# Patient Record
Sex: Female | Born: 1960 | Race: Black or African American | Hispanic: No | Smoking: Current every day smoker
Health system: Southern US, Community
[De-identification: ages and names within clinical notes are randomized; demographics above are authoritative.]

## PROBLEM LIST (undated history)

## (undated) DIAGNOSIS — Z720 Tobacco use: Secondary | ICD-10-CM

## (undated) DIAGNOSIS — N189 Chronic kidney disease, unspecified: Secondary | ICD-10-CM

## (undated) DIAGNOSIS — G35 Multiple sclerosis: Secondary | ICD-10-CM

## (undated) DIAGNOSIS — G894 Chronic pain syndrome: Secondary | ICD-10-CM

## (undated) DIAGNOSIS — I10 Essential (primary) hypertension: Secondary | ICD-10-CM

## (undated) DIAGNOSIS — M109 Gout, unspecified: Secondary | ICD-10-CM

## (undated) DIAGNOSIS — U071 COVID-19: Secondary | ICD-10-CM

## (undated) HISTORY — DX: Tobacco use: Z72.0

## (undated) HISTORY — DX: Chronic kidney disease, unspecified: N18.9

## (undated) HISTORY — PX: FOOT SURGERY: SHX648

## (undated) HISTORY — DX: Chronic pain syndrome: G89.4

## (undated) HISTORY — DX: COVID-19: U07.1

---

## 2013-02-10 ENCOUNTER — Emergency Department (INDEPENDENT_AMBULATORY_CARE_PROVIDER_SITE_OTHER)
Admission: EM | Admit: 2013-02-10 | Discharge: 2013-02-10 | Disposition: A | Discharge status: Home / Self Care | Payer: Self-pay | Source: Home / Self Care

## 2013-02-10 ENCOUNTER — Encounter (HOSPITAL_COMMUNITY): Payer: Self-pay | Admitting: Emergency Medicine

## 2013-02-10 DIAGNOSIS — R51 Headache: Secondary | ICD-10-CM

## 2013-02-10 DIAGNOSIS — I10 Essential (primary) hypertension: Secondary | ICD-10-CM

## 2013-02-10 DIAGNOSIS — M79609 Pain in unspecified limb: Secondary | ICD-10-CM

## 2013-02-10 DIAGNOSIS — J329 Chronic sinusitis, unspecified: Secondary | ICD-10-CM

## 2013-02-10 DIAGNOSIS — M79605 Pain in left leg: Secondary | ICD-10-CM

## 2013-02-10 DIAGNOSIS — IMO0001 Reserved for inherently not codable concepts without codable children: Secondary | ICD-10-CM

## 2013-02-10 HISTORY — DX: Essential (primary) hypertension: I10

## 2013-02-10 MED ORDER — TRAMADOL HCL 50 MG PO TABS: 50.0000 mg | ORAL_TABLET | Freq: Four times a day (QID) | ORAL | Status: DC | PRN

## 2013-02-10 MED ORDER — ALBUTEROL SULFATE HFA 108 (90 BASE) MCG/ACT IN AERS: 1.0000 | INHALATION_SPRAY | Freq: Four times a day (QID) | RESPIRATORY_TRACT | Status: DC | PRN

## 2013-02-10 MED ORDER — LISINOPRIL-HYDROCHLOROTHIAZIDE 20-12.5 MG PO TABS: 1.0000 | ORAL_TABLET | Freq: Every day | ORAL | Status: DC

## 2013-02-10 MED ORDER — AMLODIPINE BESYLATE 10 MG PO TABS: 5.0000 mg | ORAL_TABLET | Freq: Every day | ORAL | Status: DC

## 2013-02-10 MED ORDER — METHYLPREDNISOLONE 4 MG PO KIT: PACK | ORAL | Status: DC

## 2013-02-10 MED ORDER — KETOROLAC TROMETHAMINE 30 MG/ML IJ SOLN
30.0000 mg | Freq: Once | INTRAMUSCULAR | Status: AC
  Administered 2013-02-10: 30 mg via INTRAMUSCULAR

## 2013-02-10 MED ORDER — KETOROLAC TROMETHAMINE 30 MG/ML IJ SOLN
INTRAMUSCULAR | Status: AC
  Filled 2013-02-10: qty 1

## 2013-02-10 NOTE — ED Provider Notes (Signed)
History     CSN: [id number]  Arrival date & time 02/10/13  1314   First MD Initiated Contact with Patient 02/10/13 1452      Chief Complaint  Patient presents with  . Muscle Pain    (Consider location/radiation/quality/duration/timing/severity/associated sxs/prior treatment) HPI Comments: 52 year old morbidly obese female with history of hypertension , chronic pain and tobacco abuse for many years presents with headache for several months possibly a 20 year. Pain in her arms especially the right arm for over a year and left leg pain for greater than a year. She visited the hospital approximately 3 months ago with right sided headache and facial pain was told she had a sinus infection treated with medications but she said she never got better. She did not followup with PCP in she is not taking her blood pressure medication. Denies chest pain but occasionally has shortness of breath. She is complaining of pain all over her body.  Patient is a 52 y.o. female presenting with musculoskeletal pain.  Muscle Pain Associated symptoms include shortness of breath. Pertinent negatives include no chest pain.    Past Medical History  Diagnosis Date  . Hypertension     Past Surgical History  Procedure Laterality Date  . Foot surgery Right     No family history on file.  History  Substance Use Topics  . Smoking status: Current Every Day Smoker  . Smokeless tobacco: Not on file  . Alcohol Use: No    OB History   Grav Para Term Preterm Abortions TAB SAB Ect Mult Living                  Review of Systems  Constitutional: Positive for activity change. Negative for fever.  HENT: Positive for sinus pressure. Negative for sore throat and neck pain.   Respiratory: Positive for shortness of breath.   Cardiovascular: Negative for chest pain, palpitations and leg swelling.  Gastrointestinal: Negative.   Musculoskeletal: Positive for myalgias, arthralgias and gait problem.  Skin: Negative.    Neurological: Negative.   Psychiatric/Behavioral: Positive for dysphoric mood.    Allergies  Review of patient's allergies indicates no known allergies.  Home Medications   Current Outpatient Rx  Name  Route  Sig  Dispense  Refill  . OVER THE COUNTER MEDICATION      Reports there is a third blood pressure medicine, but cannot remember the name and lost the bottle         . albuterol (PROVENTIL HFA;VENTOLIN HFA) 108 (90 BASE) MCG/ACT inhaler   Inhalation   Inhale 1-2 puffs into the lungs every 6 (six) hours as needed for wheezing.   1 Inhaler   0   . amLODipine (NORVASC) 10 MG tablet   Oral   Take 0.5 tablets (5 mg total) by mouth daily.   15 tablet   0   . hydrochlorothiazide (HYDRODIURIL) 12.5 MG tablet   Oral   Take 12.5 mg by mouth daily.         Marland Kitchen lisinopril (PRINIVIL,ZESTRIL) 10 MG tablet   Oral   Take 10 mg by mouth daily.         Marland Kitchen lisinopril-hydrochlorothiazide (ZESTORETIC) 20-12.5 MG per tablet   Oral   Take 1 tablet by mouth daily.   20 tablet   0   . methylPREDNISolone (MEDROL DOSEPAK) 4 MG tablet      follow package directions   21 tablet   0   . traMADol (ULTRAM) 50 MG tablet  Oral   Take 1 tablet (50 mg total) by mouth every 6 (six) hours as needed for pain.   15 tablet   0     BP 199/107  Pulse 72  Temp(Src) 98.5 F (36.9 C) (Oral)  Resp 22  SpO2 95%  Physical Exam  Nursing note and vitals reviewed. Constitutional: She is oriented to person, place, and time. She appears well-developed and well-nourished. No distress.  HENT:  Right Ear: External ear normal.  Left Ear: External ear normal.  Mouth/Throat: Oropharynx is clear and moist. No oropharyngeal exudate.  Eyes: EOM are normal. Pupils are equal, round, and reactive to light.  Neck: Normal range of motion. Neck supple.  Cardiovascular: Normal rate, regular rhythm and normal heart sounds.   Pulmonary/Chest: Effort normal and breath sounds normal. She has no rales. She  exhibits tenderness.  Rare, and expiratory wheeze with forced expirations only. Positive for diffuse anterior chest wall tenderness as well as marked tenderness over the clavicle.  Musculoskeletal: She exhibits tenderness. She exhibits no edema.  Moderate to severe tenderness over the clavicle, triceps musculature. Muscles of the left arm, left lower leg, knee, back and forehead. When pain is reproduced with manual palpation she withdrawals and starts to cry.  Lymphadenopathy:    She has no cervical adenopathy.  Neurological: She is alert and oriented to person, place, and time. No cranial nerve deficit or sensory deficit. She exhibits normal muscle tone.  Skin: Skin is warm and dry. No rash noted. No erythema.  Psychiatric: Her speech is normal. Thought content normal. She exhibits a depressed mood.    ED Course  Procedures (including critical care time)  Labs Reviewed - No data to display No results found.   1. Myalgia and myositis, unspecified   2. Headache   3. Sinusitis   4. HTN (hypertension)   5. Leg pain, left       MDM  Zestoretic 20/12.5 mg one daily Amlodipine 5 mg daily Medrol Dosepak as directed Tramadol 50 mg every 4 hours when necessary pain #15 Albuterol HFA one to 2 puffs every 4 hours when necessary cough and wheeze [facility] tomorrow for appointment prefer to number above.        Hayden Rasmussen, NP 02/10/13 [phone]

## 2013-02-10 NOTE — ED Notes (Signed)
Patient reports body pain is her concern today.  Patient reports right sided headache and bilateral arms and leg pains for > 1 year.  "body pain" over the past few days.  Also reports having 3-4 diarrhea stools/day for the past 2 days.  Reports feeling generally weak.  Denies fever.

## 2013-02-10 NOTE — ED Provider Notes (Signed)
Medical screening examination/treatment/procedure(s) were performed by resident physician or non-physician practitioner and as supervising physician I was immediately available for consultation/collaboration.   KINDL,JAMES DOUGLAS MD.   James D Kindl, MD 02/10/13 1845 

## 2013-02-15 ENCOUNTER — Ambulatory Visit: Payer: Self-pay | Attending: Family Medicine | Admitting: Internal Medicine

## 2013-02-15 VITALS — BP 198/142 | HR 79 | Temp 98.2°F | Resp 19 | Wt 269.8 lb

## 2013-02-15 DIAGNOSIS — I10 Essential (primary) hypertension: Secondary | ICD-10-CM | POA: Insufficient documentation

## 2013-02-15 MED ORDER — CLONIDINE HCL 0.1 MG PO TABS
0.1000 mg | ORAL_TABLET | Freq: Once | ORAL | Status: AC
  Administered 2013-02-15: 0.1 mg via ORAL

## 2013-02-15 MED ORDER — HYDROCODONE-ACETAMINOPHEN 10-325 MG PO TABS: 1.0000 | ORAL_TABLET | Freq: Three times a day (TID) | ORAL | Status: DC | PRN

## 2013-02-15 NOTE — Progress Notes (Addendum)
Patient ID: Tiffany Brady, female   DOB: 1961-01-07, 52 y.o.   MRN: [id number]  CC: Followup for blood pressure medications  HPI: 52 year old female with past medical history of hypertension and asthma who presented to clinic for followup. She complains of headaches worsening over past couple of days. She has not been compliant with her blood pressure medications and on this visit her blood pressure is high at 198/142. Patient has no complaints of chest pain, shortness of breath or palpitations. No dizziness or loss of consciousness. No sweating and no lightheadedness.  No Known Allergies Past Medical History  Diagnosis Date  . Hypertension    Current Outpatient Prescriptions on File Prior to Visit  Medication Sig Dispense Refill  . albuterol (PROVENTIL HFA;VENTOLIN HFA) 108 (90 BASE) MCG/ACT inhaler Inhale 1-2 puffs into the lungs every 6 (six) hours as needed for wheezing.  1 Inhaler  0  . amLODipine (NORVASC) 10 MG tablet Take 0.5 tablets (5 mg total) by mouth daily.  15 tablet  0  . hydrochlorothiazide (HYDRODIURIL) 12.5 MG tablet Take 12.5 mg by mouth daily.      Marland Kitchen lisinopril (PRINIVIL,ZESTRIL) 10 MG tablet Take 10 mg by mouth daily.      Marland Kitchen lisinopril-hydrochlorothiazide (ZESTORETIC) 20-12.5 MG per tablet Take 1 tablet by mouth daily.  20 tablet  0  . methylPREDNISolone (MEDROL DOSEPAK) 4 MG tablet follow package directions  21 tablet  0  . OVER THE COUNTER MEDICATION Reports there is a third blood pressure medicine, but cannot remember the name and lost the bottle      . traMADol (ULTRAM) 50 MG tablet Take 1 tablet (50 mg total) by mouth every 6 (six) hours as needed for pain.  15 tablet  0   No current facility-administered medications on file prior to visit.   History reviewed. No pertinent family history. History   Social History  . Marital Status: Widowed    Spouse Name: N/A    Number of Children: N/A  . Years of Education: N/A   Occupational History  . Not on file.    Social History Main Topics  . Smoking status: Current Every Day Smoker  . Smokeless tobacco: Not on file  . Alcohol Use: No  . Drug Use: No  . Sexually Active: Not on file   Other Topics Concern  . Not on file   Social History Narrative  . No narrative on file   Family medical history significant for HTN, HLD  Review of Systems  Constitutional: Negative for fever, chills, diaphoresis, activity change, appetite change and fatigue.  HENT: Negative for ear pain, nosebleeds, congestion, facial swelling, rhinorrhea, neck pain, neck stiffness and ear discharge.   Eyes: Negative for pain, discharge, redness, itching and visual disturbance.  Respiratory: Negative for cough, choking, chest tightness, shortness of breath, wheezing and stridor.   Cardiovascular: Negative for chest pain, palpitations and leg swelling.  Gastrointestinal: Negative for abdominal distention.  Genitourinary: Negative for dysuria, urgency, frequency, hematuria, flank pain, decreased urine volume, difficulty urinating and dyspareunia.  Musculoskeletal: Negative for back pain, joint swelling, arthralgias and gait problem.  Neurological: Negative for dizziness, tremors, seizures, syncope, facial asymmetry, speech difficulty, weakness, light-headedness, numbness and headaches.  Hematological: Negative for adenopathy. Does not bruise/bleed easily.  Psychiatric/Behavioral: Negative for hallucinations, behavioral problems, confusion, dysphoric mood, decreased concentration and agitation.    Objective:   Filed Vitals:   02/15/13 1627  BP: 198/142  Pulse: 79  Temp: 98.2 F (36.8 C)  Resp: 19  Physical Exam  Constitutional: Appears well-developed and well-nourished. No distress.  HENT: Normocephalic. External right and left ear normal. Oropharynx is clear and moist.  Eyes: Conjunctivae and EOM are normal. PERRLA, no scleral icterus.  Neck: Normal ROM. Neck supple. No JVD. No tracheal deviation. No thyromegaly.   CVS: RRR, S1/S2 +, no murmurs, no gallops, no carotid bruit.  Pulmonary: Effort and breath sounds normal, no stridor, rhonchi, wheezes, rales.  Abdominal: Soft. BS +,  no distension, tenderness, rebound or guarding.  Musculoskeletal: Normal range of motion. No edema and no tenderness.  Lymphadenopathy: No lymphadenopathy noted, cervical, inguinal. Neuro: Alert. Normal reflexes, muscle tone coordination. No cranial nerve deficit. Skin: Skin is warm and dry. No rash noted. Not diaphoretic. No erythema. No pallor.  Psychiatric: Normal mood and affect. Behavior, judgment, thought content normal.   No results found for this basename: WBC, HGB, HCT, MCV, PLT   No results found for this basename: CREATININE, BUN, NA, K, CL, CO2    No results found for this basename: HGBA1C   Lipid Panel  No results found for this basename: chol, trig, hdl, cholhdl, vldl, ldlcalc      Assessment and plan:   Patient Active Problem List   Diagnosis Date Noted  . Accelerated hypertension 02/15/2013    Priority: High - Blood pressure 198/142. Patient is not taking the blood pressure medications consistently. She does have prescriptions with her however she did not fill them.  - We will give Catapres 0.1 mg and see if this lower the blood pressure to reasonable number. If not then we will most likely refer patient to emergency room for further evaluation.   - Blood pressure followup evaluation ; 177/107; patient can go home with the instruction to refill her medications for which she has already been given prescriptions

## 2013-02-15 NOTE — Patient Instructions (Addendum)

## 2013-02-15 NOTE — Addendum Note (Signed)
Addended by: Ronnie Derby C on: 02/15/2013 05:00 PM   Modules accepted: Orders

## 2013-02-15 NOTE — Progress Notes (Signed)
Patient here to establish care History of hypertension Complains of headaches Pain in mucles in arms

## 2013-02-15 NOTE — Addendum Note (Signed)
Addended by: Alison Murray on: 02/15/2013 05:44 PM   Modules accepted: Orders

## 2013-05-30 ENCOUNTER — Ambulatory Visit: Payer: Self-pay

## 2013-06-04 ENCOUNTER — Ambulatory Visit: Payer: Self-pay | Admitting: Internal Medicine

## 2013-06-05 ENCOUNTER — Emergency Department (HOSPITAL_COMMUNITY): Payer: No Typology Code available for payment source

## 2013-06-05 ENCOUNTER — Encounter: Payer: Self-pay | Admitting: Internal Medicine

## 2013-06-05 ENCOUNTER — Ambulatory Visit: Payer: No Typology Code available for payment source | Attending: Family Medicine | Admitting: Internal Medicine

## 2013-06-05 ENCOUNTER — Encounter (HOSPITAL_COMMUNITY): Payer: Self-pay | Admitting: Physical Medicine and Rehabilitation

## 2013-06-05 ENCOUNTER — Emergency Department (HOSPITAL_COMMUNITY)
Admission: EM | Admit: 2013-06-05 | Discharge: 2013-06-05 | Disposition: A | Discharge status: Home / Self Care | Payer: No Typology Code available for payment source | Attending: Emergency Medicine | Admitting: Emergency Medicine

## 2013-06-05 VITALS — BP 202/122 | HR 80 | Temp 98.1°F | Resp 18 | Ht 65.0 in | Wt 268.0 lb

## 2013-06-05 DIAGNOSIS — R5381 Other malaise: Secondary | ICD-10-CM | POA: Insufficient documentation

## 2013-06-05 DIAGNOSIS — R0789 Other chest pain: Secondary | ICD-10-CM | POA: Insufficient documentation

## 2013-06-05 DIAGNOSIS — J3489 Other specified disorders of nose and nasal sinuses: Secondary | ICD-10-CM | POA: Insufficient documentation

## 2013-06-05 DIAGNOSIS — H538 Other visual disturbances: Secondary | ICD-10-CM | POA: Insufficient documentation

## 2013-06-05 DIAGNOSIS — M542 Cervicalgia: Secondary | ICD-10-CM | POA: Insufficient documentation

## 2013-06-05 DIAGNOSIS — R079 Chest pain, unspecified: Secondary | ICD-10-CM | POA: Insufficient documentation

## 2013-06-05 DIAGNOSIS — G8929 Other chronic pain: Secondary | ICD-10-CM | POA: Insufficient documentation

## 2013-06-05 DIAGNOSIS — IMO0001 Reserved for inherently not codable concepts without codable children: Secondary | ICD-10-CM | POA: Insufficient documentation

## 2013-06-05 DIAGNOSIS — R51 Headache: Secondary | ICD-10-CM | POA: Insufficient documentation

## 2013-06-05 DIAGNOSIS — Z79899 Other long term (current) drug therapy: Secondary | ICD-10-CM | POA: Insufficient documentation

## 2013-06-05 DIAGNOSIS — F17200 Nicotine dependence, unspecified, uncomplicated: Secondary | ICD-10-CM | POA: Insufficient documentation

## 2013-06-05 DIAGNOSIS — H53149 Visual discomfort, unspecified: Secondary | ICD-10-CM | POA: Insufficient documentation

## 2013-06-05 DIAGNOSIS — M79609 Pain in unspecified limb: Secondary | ICD-10-CM | POA: Insufficient documentation

## 2013-06-05 DIAGNOSIS — M791 Myalgia, unspecified site: Secondary | ICD-10-CM

## 2013-06-05 DIAGNOSIS — I10 Essential (primary) hypertension: Secondary | ICD-10-CM | POA: Insufficient documentation

## 2013-06-05 DIAGNOSIS — R110 Nausea: Secondary | ICD-10-CM | POA: Insufficient documentation

## 2013-06-05 LAB — CBC WITH DIFFERENTIAL/PLATELET
Basophils Absolute: 0 10*3/uL (ref 0.0–0.1)
Basophils Relative: 0 % (ref 0–1)
Eosinophils Absolute: 0.3 10*3/uL (ref 0.0–0.7)
Eosinophils Relative: 3 % (ref 0–5)
HCT: 46.8 % — ABNORMAL HIGH (ref 36.0–46.0)
Hemoglobin: 15.4 g/dL — ABNORMAL HIGH (ref 12.0–15.0)
Lymphocytes Relative: 27 % (ref 12–46)
Lymphs Abs: 2.4 10*3/uL (ref 0.7–4.0)
MCH: 30 pg (ref 26.0–34.0)
MCHC: 32.9 g/dL (ref 30.0–36.0)
MCV: 91.2 fL (ref 78.0–100.0)
Monocytes Absolute: 0.4 10*3/uL (ref 0.1–1.0)
Monocytes Relative: 5 % (ref 3–12)
Neutro Abs: 5.8 10*3/uL (ref 1.7–7.7)
Neutrophils Relative %: 65 % (ref 43–77)
Platelets: 202 10*3/uL (ref 150–400)
RBC: 5.13 MIL/uL — ABNORMAL HIGH (ref 3.87–5.11)
RDW: 13.9 % (ref 11.5–15.5)
WBC: 8.8 10*3/uL (ref 4.0–10.5)

## 2013-06-05 LAB — COMPREHENSIVE METABOLIC PANEL
ALT: 18 U/L (ref 0–35)
AST: 15 U/L (ref 0–37)
Albumin: 3.3 g/dL — ABNORMAL LOW (ref 3.5–5.2)
Alkaline Phosphatase: 89 U/L (ref 39–117)
BUN: 15 mg/dL (ref 6–23)
Chloride: 106 mEq/L (ref 96–112)
Potassium: 3.8 mEq/L (ref 3.5–5.1)
Sodium: 144 mEq/L (ref 135–145)
Total Bilirubin: 0.6 mg/dL (ref 0.3–1.2)

## 2013-06-05 LAB — PRO B NATRIURETIC PEPTIDE: Pro B Natriuretic peptide (BNP): 154.5 pg/mL — ABNORMAL HIGH (ref 0–125)

## 2013-06-05 LAB — TROPONIN I: Troponin I: <0.3 ng/mL (ref <0.30)

## 2013-06-05 MED ORDER — ONDANSETRON HCL 4 MG/2ML IJ SOLN
4.0000 mg | Freq: Once | INTRAMUSCULAR | Status: AC
  Administered 2013-06-05: 4 mg via INTRAVENOUS
  Filled 2013-06-05: qty 2

## 2013-06-05 MED ORDER — SODIUM CHLORIDE 0.9 % IV BOLUS (SEPSIS)
500.0000 mL | Freq: Once | INTRAVENOUS | Status: AC
  Administered 2013-06-05: 500 mL via INTRAVENOUS

## 2013-06-05 MED ORDER — PROMETHAZINE HCL 25 MG PO TABS: 25.0000 mg | ORAL_TABLET | Freq: Four times a day (QID) | ORAL | Status: DC | PRN

## 2013-06-05 MED ORDER — PROMETHAZINE HCL 25 MG/ML IJ SOLN
12.5000 mg | Freq: Once | INTRAMUSCULAR | Status: AC
  Administered 2013-06-05: 12.5 mg via INTRAVENOUS
  Filled 2013-06-05: qty 1

## 2013-06-05 MED ORDER — SODIUM CHLORIDE 0.9 % IV SOLN: INTRAVENOUS | Status: DC

## 2013-06-05 MED ORDER — HYDROMORPHONE HCL PF 1 MG/ML IJ SOLN
1.0000 mg | Freq: Once | INTRAMUSCULAR | Status: AC
  Administered 2013-06-05: 1 mg via INTRAVENOUS
  Filled 2013-06-05: qty 1

## 2013-06-05 MED ORDER — TRAMADOL HCL 50 MG PO TABS: 50.0000 mg | ORAL_TABLET | Freq: Four times a day (QID) | ORAL | Status: DC | PRN

## 2013-06-05 MED ORDER — LISINOPRIL-HYDROCHLOROTHIAZIDE 20-12.5 MG PO TABS: 1.0000 | ORAL_TABLET | Freq: Every day | ORAL | Status: DC

## 2013-06-05 NOTE — Progress Notes (Signed)
Pt here with c/o frontal H/A radiating to back of head going to neck and R arm intermit 3 mnths. Pt has Hx HTN. Has not taken meds since June Denies c/p this visit Pt to be transferred to ER

## 2013-06-05 NOTE — Progress Notes (Signed)
Patient ID: Tiffany Brady, female   DOB: [birth year], 52 y.o.   MRN: [id number]  CC: neck and arm pain, headache and blurry vision   HPI:  Patient is 52 year old female who comes to clinic with main concern of progressively worsening generalized headaches, throbbing and constant, 7/10 in severity radiating to neck, both arms, back area, associated with blurry vision and generalized fatigue. Patient reports this started several days prior to this visit and has been getting worse, no specific alleviating or aggravating factors, no similar events in the past. Patient denies shortness of breath but experiences substernal chest discomfort, pressure-like, throbbing and with no specific alleviating factors. She has not taken blood pressure medicine over several months and needs refills. No Known Allergies Past Medical History  Diagnosis Date  . Hypertension    Current Outpatient Prescriptions on File Prior to Visit  Medication Sig Dispense Refill  . HYDROcodone-acetaminophen (NORCO) 10-325 MG per tablet Take 1 tablet by mouth every 8 (eight) hours as needed for pain.  45 tablet  0  . traMADol (ULTRAM) 50 MG tablet Take 1 tablet (50 mg total) by mouth every 6 (six) hours as needed for pain.  15 tablet  0  . albuterol (PROVENTIL HFA;VENTOLIN HFA) 108 (90 BASE) MCG/ACT inhaler Inhale 1-2 puffs into the lungs every 6 (six) hours as needed for wheezing.  1 Inhaler  0  . amLODipine (NORVASC) 10 MG tablet Take 0.5 tablets (5 mg total) by mouth daily.  15 tablet  0  . hydrochlorothiazide (HYDRODIURIL) 12.5 MG tablet Take 12.5 mg by mouth daily.      Marland Kitchen lisinopril (PRINIVIL,ZESTRIL) 10 MG tablet Take 10 mg by mouth daily.      Marland Kitchen lisinopril-hydrochlorothiazide (ZESTORETIC) 20-12.5 MG per tablet Take 1 tablet by mouth daily.  20 tablet  0  . methylPREDNISolone (MEDROL DOSEPAK) 4 MG tablet follow package directions  21 tablet  0  . OVER THE COUNTER MEDICATION Reports there is a third blood pressure medicine, but  cannot remember the name and lost the bottle       No current facility-administered medications on file prior to visit.   No known family medical history History   Social History  . Marital Status: Widowed    Spouse Name: N/A    Number of Children: N/A  . Years of Education: N/A   Occupational History  . Not on file.   Social History Main Topics  . Smoking status: Current Every Day Smoker  . Smokeless tobacco: Not on file  . Alcohol Use: No  . Drug Use: No  . Sexual Activity: Not on file   Other Topics Concern  . Not on file   Social History Narrative  . No narrative on file    Review of Systems  Per history of present illness   Objective:   Filed Vitals:   06/05/13 1456  BP: 202/122  Pulse: 80  Temp: 98.1 F (36.7 C)  Resp: 18    Physical Exam  Constitutional: Appears well-developed and well-nourished. In mild distress due to pain HENT: Normocephalic. External right and left ear normal. Oropharynx is clear and moist.  Eyes: Conjunctivae and EOM are normal. PERRLA, no scleral icterus.  Neck: Normal ROM. Neck supple. No JVD. No tracheal deviation. No thyromegaly.  CVS: RRR, S1/S2 +, no murmurs, no gallops, no carotid bruit.  Pulmonary: Effort and breath sounds normal, no stridor, rhonchi, wheezes, rales.  Abdominal: Soft. BS +,  no distension, tenderness in epigastric area, no rebound or guarding.  Musculoskeletal: Normal range of motion. No edema and no tenderness.  Lymphadenopathy: No lymphadenopathy noted, cervical, inguinal. Neuro: Alert. Normal reflexes, muscle tone coordination. No cranial nerve deficit. Skin: Skin is warm and dry. No rash noted. Not diaphoretic. No erythema. No pallor.  Psychiatric: Normal mood and affect. Behavior, judgment, thought content normal.   No results found for this basename: WBC, HGB, HCT, MCV, PLT   No results found for this basename: CREATININE, BUN, NA, K, CL, CO2    No results found for this basename: HGBA1C    Lipid Panel  No results found for this basename: chol, trig, hdl, cholhdl, vldl, ldlcalc       Assessment and plan:   Patient Active Problem List   Diagnosis Date Noted  . Accelerated hypertension 02/15/2013  - blood pressure greater than 200/100, patient has not been taking blood pressure medicines as she ran out, I have advised her to go to emergency department so that she can receive IV blood pressure medication as patient is symptomatic with headaches, neck and arm pain, chest discomfort

## 2013-06-05 NOTE — ED Provider Notes (Signed)
CSN: [id number]     Arrival date & time 06/05/13  1527 History   First MD Initiated Contact with Patient 06/05/13 1817     Chief Complaint  Patient presents with  . Hypertension   (Consider location/radiation/quality/duration/timing/severity/associated sxs/prior Treatment) Patient is a 52 y.o. female presenting with hypertension. The history is provided by the patient.  Hypertension Associated symptoms include chest pain and headaches. Pertinent negatives include no abdominal pain and no shortness of breath.   The patient sent from the [facility]. Patient went there with the complaint of myalgias all over this is a chronic problem for her. Patient also with worsening headache has had a headache for the past year but got more severe on the right side. Patient states it is 8/10. Her muscle aches are also 8/10. Symptoms are associated with nausea no fever some intermittent chest pain feet swelling and also is getting over an upper respiratory infection. The head pain is on the right parietal area. The bodyaches her everywhere. Patient had been diagnosed with hypertension in the past her blood pressure [facility] was high upon arrival here was 202/120 2 repeat was 191/108. Patient states that she was on medications in the past but hasn't been for several months chart review shows that she was prescribed in June of this year with several hydrochlorothiazide combination and Norvasc. She was seen at that time for the myalgias. Which appears to be a chronic problem for her. Patient denies any shortness of breath. States that she has swelling of her feet. Past Medical History  Diagnosis Date  . Hypertension    Past Surgical History  Procedure Laterality Date  . Foot surgery Right    No family history on file. History  Substance Use Topics  . Smoking status: Current Every Day Smoker    Types: Cigarettes  . Smokeless tobacco: Not on file  . Alcohol Use: No   OB History   Grav  Para Term Preterm Abortions TAB SAB Ect Mult Living                 Review of Systems  Constitutional: Negative for fever.  HENT: Positive for congestion.   Eyes: Positive for photophobia.  Respiratory: Negative for shortness of breath.   Cardiovascular: Positive for chest pain.  Gastrointestinal: Positive for nausea. Negative for abdominal pain.  Genitourinary: Negative for dysuria.  Musculoskeletal: Positive for myalgias.  Skin: Negative for rash.  Neurological: Positive for headaches.  Hematological: Does not bruise/bleed easily.  Psychiatric/Behavioral: Negative for confusion.    Allergies  Review of patient's allergies indicates no known allergies.  Home Medications   Current Outpatient Rx  Name  Route  Sig  Dispense  Refill  . diphenhydramine-acetaminophen (TYLENOL PM) 25-500 MG TABS   Oral   Take 2 tablets by mouth at bedtime as needed (pain and sleep).         Marland Kitchen lisinopril-hydrochlorothiazide (PRINZIDE) 20-12.5 MG per tablet   Oral   Take 1 tablet by mouth daily.   30 tablet   1   . promethazine (PHENERGAN) 25 MG tablet   Oral   Take 1 tablet (25 mg total) by mouth every 6 (six) hours as needed for nausea.   20 tablet   0   . traMADol (ULTRAM) 50 MG tablet   Oral   Take 1 tablet (50 mg total) by mouth every 6 (six) hours as needed for pain.   20 tablet   0    BP 166/95  Pulse 80  Temp(Src) 98 F (36.7 C) (Oral)  Resp 31  SpO2 96% Physical Exam  Nursing note and vitals reviewed. Constitutional: She is oriented to person, place, and time. She appears well-developed and well-nourished. No distress.  HENT:  Head: Normocephalic and atraumatic.  Mouth/Throat: Oropharynx is clear and moist.  Eyes: Conjunctivae and EOM are normal. Pupils are equal, round, and reactive to light.  Neck: Normal range of motion. Neck supple.  Cardiovascular: Normal rate, regular rhythm and normal heart sounds.   No murmur heard. Pulmonary/Chest: Effort normal and breath  sounds normal. No respiratory distress. She has no wheezes. She has no rales.  Abdominal: Soft. Bowel sounds are normal. There is no tenderness.  Musculoskeletal: Normal range of motion. She exhibits no edema.  Neurological: She is alert and oriented to person, place, and time. No cranial nerve deficit. She exhibits normal muscle tone. Coordination normal.  Skin: Skin is warm. No rash noted.    ED Course  Procedures (including critical care time) Labs Review Labs Reviewed  PRO B NATRIURETIC PEPTIDE - Abnormal; Notable for the following:    Pro B Natriuretic peptide (BNP) 154.5 (*)    All other components within normal limits  COMPREHENSIVE METABOLIC PANEL - Abnormal; Notable for the following:    Glucose, Bld 110 (*)    Creatinine, Ser 1.26 (*)    Albumin 3.3 (*)    GFR calc non Af Amer 48 (*)    GFR calc Af Amer 55 (*)    All other components within normal limits  CBC WITH DIFFERENTIAL - Abnormal; Notable for the following:    RBC 5.13 (*)    Hemoglobin 15.4 (*)    HCT 46.8 (*)    All other components within normal limits  TROPONIN I   Results for orders placed during the hospital encounter of 06/05/13  PRO B NATRIURETIC PEPTIDE      Result Value Range   Pro B Natriuretic peptide (BNP) 154.5 (*) 0 - 125 pg/mL  COMPREHENSIVE METABOLIC PANEL      Result Value Range   Sodium 144  135 - 145 mEq/L   Potassium 3.8  3.5 - 5.1 mEq/L   Chloride 106  96 - 112 mEq/L   CO2 27  19 - 32 mEq/L   Glucose, Bld 110 (*) 70 - 99 mg/dL   BUN 15  6 - 23 mg/dL   Creatinine, Ser 2.13 (*) 0.50 - 1.10 mg/dL   Calcium 9.1  8.4 - 08.6 mg/dL   Total Protein 7.7  6.0 - 8.3 g/dL   Albumin 3.3 (*) 3.5 - 5.2 g/dL   AST 15  0 - 37 U/L   ALT 18  0 - 35 U/L   Alkaline Phosphatase 89  39 - 117 U/L   Total Bilirubin 0.6  0.3 - 1.2 mg/dL   GFR calc non Af Amer 48 (*) >90 mL/min   GFR calc Af Amer 55 (*) >90 mL/min  CBC WITH DIFFERENTIAL      Result Value Range   WBC 8.8  4.0 - 10.5 K/uL   RBC 5.13 (*)  3.87 - 5.11 MIL/uL   Hemoglobin 15.4 (*) 12.0 - 15.0 g/dL   HCT 57.8 (*) 46.9 - 62.9 %   MCV 91.2  78.0 - 100.0 fL   MCH 30.0  26.0 - 34.0 pg   MCHC 32.9  30.0 - 36.0 g/dL   RDW 52.8  41.3 - 24.4 %   Platelets 202  150 - 400  K/uL   Neutrophils Relative % 65  43 - 77 %   Neutro Abs 5.8  1.7 - 7.7 K/uL   Lymphocytes Relative 27  12 - 46 %   Lymphs Abs 2.4  0.7 - 4.0 K/uL   Monocytes Relative 5  3 - 12 %   Monocytes Absolute 0.4  0.1 - 1.0 K/uL   Eosinophils Relative 3  0 - 5 %   Eosinophils Absolute 0.3  0.0 - 0.7 K/uL   Basophils Relative 0  0 - 1 %   Basophils Absolute 0.0  0.0 - 0.1 K/uL  TROPONIN I      Result Value Range   Troponin I <0.30  <0.30 ng/mL    Imaging Review Dg Chest 2 View  06/05/2013   CLINICAL DATA:  Elevated blood pressure  EXAM: CHEST  2 VIEW  COMPARISON:  None  FINDINGS: Diminished EXAM detail due to low lung volumes and patient's body habitus. The heart size appears enlarged. The mediastinal contours are within normal limits. Both lungs are clear. The visualized skeletal structures are unremarkable.  IMPRESSION: No active cardiopulmonary disease.   Electronically Signed   By: Signa Kell M.D.   On: 06/05/2013 20:26   Ct Head Wo Contrast  06/05/2013   CLINICAL DATA:  Headache with hypertension  EXAM: CT HEAD WITHOUT CONTRAST  TECHNIQUE: Contiguous axial images were obtained from the base of the skull through the vertex without intravenous contrast. Study was obtained within 24 hr of patient's arrival at the emergency department.  COMPARISON:  None.  FINDINGS: The ventricles are normal in size and configuration. There is no mass, hemorrhage, extra-axial fluid collection, or midline shift. There is mild small vessel disease in the centra semiovale bilaterally. Gray-white compartments are otherwise normal. There is no demonstrable acute infarct.  Bony calvarium appears intact. The mastoid air cells are clear. There is opacification of a posterior right ethmoid air  cell.  IMPRESSION: Mild periventricular small vessel disease. No intracranial mass, hemorrhage, or acute infarct. Mild right posterior ethmoid sinus disease.   Electronically Signed   By: Bretta Bang   On: 06/05/2013 20:05    MDM   1. Myalgia   2. Hypertension    Patient with long-standing hypertension poorly controlled. Patient showing some mild renal insufficiency. Will start back on hypertensive meds noted in June that she was started on fosinopril hydrochlorothiazide combination. We'll re\re prescribe that. Rest of her workup without any significant findings. Patient's body aches which is suggestive of fibromyalgia. Here with pain medicine. After the pain medicine she got some nausea and vomiting that was improved with Phenergan. [facility]. Chest x-ray shows no evidence of pulmonary edema or congestive heart failure. Head CT had no acute changes.    Shelda Jakes, MD 06/05/13 [phone]

## 2013-06-05 NOTE — Patient Instructions (Signed)

## 2013-06-05 NOTE — ED Notes (Signed)
Pt presents to department for evaluation of hypertension. States she hasn't had any of her medications since June. Also reports headache x3 months. 7/10 headache at the time. No neurological deficits noted. Denies chest pain. Pt is conscious alert and oriented x4.

## 2013-06-05 NOTE — Progress Notes (Signed)
Pt to be transferred to ER via private vehicle. Report given to nurse [facility]

## 2013-06-12 ENCOUNTER — Ambulatory Visit: Payer: Self-pay

## 2013-06-27 ENCOUNTER — Ambulatory Visit: Payer: No Typology Code available for payment source | Attending: Internal Medicine | Admitting: Internal Medicine

## 2013-06-27 VITALS — BP 187/94 | HR 91 | Temp 98.6°F | Resp 18 | Ht 65.0 in | Wt 292.0 lb

## 2013-06-27 DIAGNOSIS — I10 Essential (primary) hypertension: Secondary | ICD-10-CM | POA: Insufficient documentation

## 2013-06-27 DIAGNOSIS — E669 Obesity, unspecified: Secondary | ICD-10-CM | POA: Insufficient documentation

## 2013-06-27 DIAGNOSIS — G8929 Other chronic pain: Secondary | ICD-10-CM | POA: Insufficient documentation

## 2013-06-27 DIAGNOSIS — F17200 Nicotine dependence, unspecified, uncomplicated: Secondary | ICD-10-CM

## 2013-06-27 DIAGNOSIS — G894 Chronic pain syndrome: Secondary | ICD-10-CM

## 2013-06-27 MED ORDER — ATENOLOL 50 MG PO TABS: 50.0000 mg | ORAL_TABLET | Freq: Every day | ORAL | Status: DC

## 2013-06-27 MED ORDER — CYCLOBENZAPRINE HCL 10 MG PO TABS: 10.0000 mg | ORAL_TABLET | Freq: Two times a day (BID) | ORAL | Status: DC | PRN

## 2013-06-27 MED ORDER — TRAMADOL HCL 50 MG PO TABS: 50.0000 mg | ORAL_TABLET | Freq: Three times a day (TID) | ORAL | Status: DC | PRN

## 2013-06-27 MED ORDER — CLONIDINE HCL 0.1 MG PO TABS
0.2000 mg | ORAL_TABLET | Freq: Once | ORAL | Status: AC
  Administered 2013-06-27: 0.2 mg via ORAL

## 2013-06-27 MED ORDER — LOSARTAN POTASSIUM-HCTZ 100-25 MG PO TABS: 1.0000 | ORAL_TABLET | Freq: Every day | ORAL | Status: DC

## 2013-06-27 NOTE — Progress Notes (Signed)
Patient ID: Tiffany Brady, female   DOB: 08-02-1961, 52 y.o.   MRN: [id number] Patient Demographics  Tiffany Brady, is a 52 y.o. female  NWG:[id number]  [id number]:[id number]  DOB - Mar 23, 1961  Chief Complaint  Patient presents with  . Establish Care  . Hypertension        Subjective:   Tiffany Brady today is here for a follow up visit. Patient has a BP of 187/94, originally was over 200 the time of arrival, patient reported that she was anxious. She states that she has some headache, mild shortness of breath, no palpitations or chest pain She also reports that she has been having pain all over, feels muscle spasms, in the back and the arms, joints.  Patient has No headache, No chest pain, No abdominal pain - No Nausea, No new weakness tingling or numbness, No Cough - SOB.   Objective:    Filed Vitals:   06/27/13 1755  BP: 187/94  Pulse: 91  Temp: 98.6 F (37 C)  TempSrc: Oral  Resp: 18  Height: 5\' 5"  (1.651 m)  Weight: 292 lb (132.45 kg)  SpO2: 97%     ALLERGIES:  No Known Allergies  PAST MEDICAL HISTORY: Past Medical History  Diagnosis Date  . Hypertension     MEDICATIONS AT HOME: Prior to Admission medications   Medication Sig Start Date End Date Taking? Authorizing Provider  atenolol (TENORMIN) 50 MG tablet Take 1 tablet (50 mg total) by mouth daily. 06/27/13   Davontay Watlington Jenna Luo, MD  cyclobenzaprine (FLEXERIL) 10 MG tablet Take 1 tablet (10 mg total) by mouth 2 (two) times daily as needed for muscle spasms. 06/27/13   Raizel Wesolowski Jenna Luo, MD  diphenhydramine-acetaminophen (TYLENOL PM) 25-500 MG TABS Take 2 tablets by mouth at bedtime as needed (pain and sleep).    Historical Provider, MD  losartan-hydrochlorothiazide (HYZAAR) 100-25 MG per tablet Take 1 tablet by mouth daily. 06/27/13   Keaghan Staton Jenna Luo, MD  promethazine (PHENERGAN) 25 MG tablet Take 1 tablet (25 mg total) by mouth every 6 (six) hours as needed for nausea. 06/05/13   Shelda Jakes, MD  traMADol  (ULTRAM) 50 MG tablet Take 1-2 tablets (50-100 mg total) by mouth every 8 (eight) hours as needed for pain. 06/27/13   Nyjah Schwake Jenna Luo, MD     Exam  General appearance :Awake, alert, NAD, Speech Clear.  HEENT: Atraumatic and Normocephalic, PERLA Neck: supple, no JVD. No cervical lymphadenopathy.  Chest: Clear to auscultation bilaterally, no wheezing, rales or rhonchi CVS: S1 S2 regular, no murmurs.  Abdomen: soft, NBS, NT, ND, no gaurding, rigidity or rebound. Extremities: no cyanosis or clubbing, B/L Lower Ext shows no edema Neurology: Awake alert, and oriented X 3, CN II-XII intact, Non focal Skin: No Rash or lesions, no joint effusions or inflammation or synovitis noticed Wounds:N/A    Data Review   Basic Metabolic Panel: No results found for this basename: NA, K, CL, CO2, GLUCOSE, BUN, CREATININE, CALCIUM, MG, PHOS,  in the last 168 hours Liver Function Tests: No results found for this basename: AST, ALT, ALKPHOS, BILITOT, PROT, ALBUMIN,  in the last 168 hours  CBC: No results found for this basename: WBC, NEUTROABS, HGB, HCT, MCV, PLT,  in the last 168 hours  ------------------------------------------------------------------------------------------------------------------ No results found for this basename: HGBA1C,  in the last 72 hours ------------------------------------------------------------------------------------------------------------------ No results found for this basename: CHOL, HDL, LDLCALC, TRIG, CHOLHDL, LDLDIRECT,  in the last 72 hours ------------------------------------------------------------------------------------------------------------------ No results found for this basename: TSH, T4TOTAL,  FREET3, T3FREE, THYROIDAB,  in the last 72 hours ------------------------------------------------------------------------------------------------------------------ No results found for this basename: VITAMINB12, FOLATE, FERRITIN, TIBC, IRON, RETICCTPCT,  in the last  72 hours  Coagulation profile  No results found for this basename: INR, PROTIME,  in the last 168 hours    Assessment & Plan   Active Problems: Accelerated hypertension patient was seen on 24th of September in the ER, BP was elevated 210/110 at the highest  - Gave clonidine 0.2 mg x1 - Started on losartan/HCTZ 100/25 mg, atenolol 50 mg daily - Check renal artery duplex, renin, aldosterone  Chronic pain with muscle spasms - Recheck ESR, CRP, ANA, aldolase, CK, CCP, TSH at the next visit  Obesity: Counseled strongly on diet and weight control  Recommendations: Follow for BP check and labs in 2 weeks  Follow-up in 2 weeks     Tiras Bianchini M.D. 06/27/2013, 5:59 PM

## 2013-06-27 NOTE — Progress Notes (Signed)
Pt here for bp recheck C/o slight h/a and pain all over Taking prescribed medication for bp BP 187/94

## 2013-06-28 ENCOUNTER — Telehealth: Payer: Self-pay | Admitting: Emergency Medicine

## 2013-06-28 NOTE — Telephone Encounter (Signed)
Pt given scheduled appt Renal Duplex Scan @ [facility] hospital 07/01/13 11am. Pt informed to use N tower entrance for registration

## 2013-07-01 ENCOUNTER — Ambulatory Visit (HOSPITAL_COMMUNITY)
Admission: RE | Admit: 2013-07-01 | Discharge: 2013-07-01 | Disposition: A | Discharge status: Home / Self Care | Payer: No Typology Code available for payment source | Source: Ambulatory Visit | Attending: Internal Medicine | Admitting: Internal Medicine

## 2013-07-01 DIAGNOSIS — G894 Chronic pain syndrome: Secondary | ICD-10-CM

## 2013-07-01 DIAGNOSIS — F17200 Nicotine dependence, unspecified, uncomplicated: Secondary | ICD-10-CM

## 2013-07-01 DIAGNOSIS — I10 Essential (primary) hypertension: Secondary | ICD-10-CM

## 2013-07-01 DIAGNOSIS — E669 Obesity, unspecified: Secondary | ICD-10-CM

## 2013-07-01 NOTE — Progress Notes (Addendum)
*  PRELIMINARY RESULTS* Vascular Ultrasound Renal Artery Duplex has been completed.  Findings suggest 1-59% renal artery stenosis bilaterally.   07/01/2013 11:34 AM Gertie Fey, RVT, RDCS, RDMS

## 2013-07-23 ENCOUNTER — Ambulatory Visit: Payer: No Typology Code available for payment source | Attending: Internal Medicine | Admitting: Internal Medicine

## 2013-07-23 VITALS — BP 159/92 | HR 76 | Temp 97.8°F | Resp 17 | Wt 266.0 lb

## 2013-07-23 DIAGNOSIS — I10 Essential (primary) hypertension: Secondary | ICD-10-CM

## 2013-07-23 DIAGNOSIS — G43909 Migraine, unspecified, not intractable, without status migrainosus: Secondary | ICD-10-CM | POA: Insufficient documentation

## 2013-07-23 DIAGNOSIS — E669 Obesity, unspecified: Secondary | ICD-10-CM

## 2013-07-23 DIAGNOSIS — G894 Chronic pain syndrome: Secondary | ICD-10-CM

## 2013-07-23 MED ORDER — DULOXETINE HCL 20 MG PO CPEP: 20.0000 mg | ORAL_CAPSULE | Freq: Every day | ORAL | Status: DC

## 2013-07-23 MED ORDER — FEXOFENADINE-PSEUDOEPHED ER 180-240 MG PO TB24: 1.0000 | ORAL_TABLET | Freq: Every day | ORAL | Status: DC

## 2013-07-23 MED ORDER — BUTALBITAL-APAP-CAFFEINE 50-325-40 MG PO TABS: 1.0000 | ORAL_TABLET | Freq: Four times a day (QID) | ORAL | Status: DC | PRN

## 2013-07-23 MED ORDER — AZITHROMYCIN 250 MG PO TABS: ORAL_TABLET | ORAL | Status: DC

## 2013-07-23 NOTE — Patient Instructions (Signed)

## 2013-07-23 NOTE — Progress Notes (Signed)
Patient ID: Tiffany Brady, female   DOB: [birth year], 52 y.o.   MRN: [id number]  CC: Chronic pain  HPI: 52 year old female with past medical history of chronic muscle pain, headaches, hypertension who presented to clinic for followup. Patient reports Flexeril does not help at all with muscle pains. She has chronic headaches, frontal and bilateral side of the head, usually gets better with rest but it can be exacerbated by light, noise, sneezing or coughing. Patient reports no blurry vision and no auras.  No Known Allergies Past Medical History  Diagnosis Date  . Hypertension    Current Outpatient Prescriptions on File Prior to Visit  Medication Sig Dispense Refill  . atenolol (TENORMIN) 50 MG tablet Take 1 tablet (50 mg total) by mouth daily.  30 tablet  3  . losartan-hydrochlorothiazide (HYZAAR) 100-25 MG per tablet Take 1 tablet by mouth daily.  30 tablet  3   No current facility-administered medications on file prior to visit.   Hypertension in mother.  History   Social History  . Marital Status: Widowed    Spouse Name: N/A    Number of Children: N/A  . Years of Education: N/A   Occupational History  . Not on file.   Social History Main Topics  . Smoking status: Current Every Day Smoker    Types: Cigarettes  . Smokeless tobacco: Not on file  . Alcohol Use: No  . Drug Use: No  . Sexual Activity: Not on file   Other Topics Concern  . Not on file   Social History Narrative  . No narrative on file    Review of Systems  Constitutional: Negative for fever, chills, diaphoresis, activity change, appetite change and fatigue.  HENT: Negative for ear pain, nosebleeds, congestion, facial swelling, rhinorrhea, neck pain, neck stiffness and ear discharge.   Eyes: Negative for pain, discharge, redness, itching and visual disturbance.  Respiratory: Negative for cough, choking, chest tightness, shortness of breath, wheezing and stridor.   positive for chest  congestion Cardiovascular: Negative for chest pain, palpitations and leg swelling.  Gastrointestinal: Negative for abdominal distention.  Genitourinary: Negative for dysuria, urgency, frequency, hematuria, flank pain, decreased urine volume, difficulty urinating and dyspareunia.  Musculoskeletal: Negative for back pain, joint swelling, arthralgias and gait problem.  has chronic pain in muscle Neurological: Negative for dizziness, tremors, seizures, syncope, facial asymmetry, speech difficulty, weakness, light-headedness, numbness and positive for headaches.  Hematological: Negative for adenopathy. Does not bruise/bleed easily.  Psychiatric/Behavioral: Negative for hallucinations, behavioral problems, confusion, dysphoric mood, decreased concentration and agitation.    Objective:   Filed Vitals:   07/23/13 1010  BP: 159/92  Pulse: 76  Temp: 97.8 F (36.6 C)  Resp: 17    Physical Exam  Constitutional: Appears well-developed and well-nourished. No distress.  HENT: Normocephalic. External right and left ear normal. Oropharynx is clear and moist.  Eyes: Conjunctivae and EOM are normal. PERRLA, no scleral icterus.  Neck: Normal ROM. Neck supple. No JVD. No tracheal deviation. No thyromegaly.  CVS: RRR, S1/S2 +, no murmurs, no gallops, no carotid bruit.  Pulmonary: Effort and breath sounds normal, no stridor, rhonchi, wheezes, rales.  Abdominal: Soft. BS +,  no distension, tenderness, rebound or guarding.  Musculoskeletal: Normal range of motion. No edema and no tenderness.  Lymphadenopathy: No lymphadenopathy noted, cervical, inguinal. Neuro: Alert. Normal reflexes, muscle tone coordination. No cranial nerve deficit. Skin: Skin is warm and dry. No rash noted. Not diaphoretic. No erythema. No pallor.  Psychiatric: Normal mood and affect. Behavior, judgment,  thought content normal.   Lab Results  Component Value Date   WBC 8.8 06/05/2013   HGB 15.4* 06/05/2013   HCT 46.8* 06/05/2013    MCV 91.2 06/05/2013   PLT 202 06/05/2013   Lab Results  Component Value Date   CREATININE 1.26* 06/05/2013   BUN 15 06/05/2013   NA 144 06/05/2013   K 3.8 06/05/2013   CL 106 06/05/2013   CO2 27 06/05/2013    No results found for this basename: HGBA1C   Lipid Panel  No results found for this basename: chol, trig, hdl, cholhdl, vldl, ldlcalc       Assessment and plan:   Patient Active Problem List   Diagnosis Date Noted  . Migraine headache 07/23/2013    Priority: Medium - We'll start Fioricet to see if there is any improvement in headaches   . Essential hypertension, benign 06/27/2013    Priority: Medium - Patient did not take morning medications.  - We have discussed target BP range - I have advised pt to check BP regularly and to call us back if the numbers are higher than 140/90 - discussed the importance of compliance with medical therapy and diet  - Continue current medications for blood pressure   . Chronic pain syndrome 06/27/2013    Priority: Medium - Stop Flexeril and start Cymbalta 20 mg daily   .  Chest congestion, possible acute bronchitis  - Start the Z-Pak  06/27/2013

## 2013-07-23 NOTE — Progress Notes (Signed)
Patient here for follow up Complains of right eye red, tearing difficult seeing Cough congestion with pain to right side of head

## 2013-07-24 ENCOUNTER — Ambulatory Visit: Payer: No Typology Code available for payment source

## 2013-09-18 ENCOUNTER — Other Ambulatory Visit: Payer: Self-pay | Admitting: Internal Medicine

## 2013-09-19 ENCOUNTER — Telehealth: Payer: Self-pay | Admitting: Internal Medicine

## 2013-09-19 NOTE — Telephone Encounter (Signed)
Patient needs prescription written for this medication

## 2013-09-20 ENCOUNTER — Other Ambulatory Visit: Payer: Self-pay | Admitting: Internal Medicine

## 2013-09-20 NOTE — Telephone Encounter (Signed)
Notified pt that her medication refill was sent into out pharmacy.

## 2013-09-24 ENCOUNTER — Other Ambulatory Visit: Payer: Self-pay | Admitting: Internal Medicine

## 2013-10-11 ENCOUNTER — Ambulatory Visit: Payer: No Typology Code available for payment source | Attending: Internal Medicine

## 2013-10-11 ENCOUNTER — Ambulatory Visit: Payer: Self-pay

## 2013-10-30 ENCOUNTER — Ambulatory Visit: Payer: No Typology Code available for payment source | Attending: Internal Medicine | Admitting: Internal Medicine

## 2013-10-30 ENCOUNTER — Encounter: Payer: Self-pay | Admitting: Internal Medicine

## 2013-10-30 ENCOUNTER — Other Ambulatory Visit: Payer: Self-pay | Admitting: Internal Medicine

## 2013-10-30 VITALS — BP 160/80 | HR 79 | Temp 98.8°F | Resp 16

## 2013-10-30 DIAGNOSIS — R030 Elevated blood-pressure reading, without diagnosis of hypertension: Secondary | ICD-10-CM

## 2013-10-30 DIAGNOSIS — Z139 Encounter for screening, unspecified: Secondary | ICD-10-CM

## 2013-10-30 DIAGNOSIS — F17200 Nicotine dependence, unspecified, uncomplicated: Secondary | ICD-10-CM

## 2013-10-30 DIAGNOSIS — I10 Essential (primary) hypertension: Secondary | ICD-10-CM | POA: Insufficient documentation

## 2013-10-30 DIAGNOSIS — IMO0001 Reserved for inherently not codable concepts without codable children: Secondary | ICD-10-CM

## 2013-10-30 DIAGNOSIS — Z23 Encounter for immunization: Secondary | ICD-10-CM

## 2013-10-30 DIAGNOSIS — G894 Chronic pain syndrome: Secondary | ICD-10-CM

## 2013-10-30 MED ORDER — DULOXETINE HCL 20 MG PO CPEP: 20.0000 mg | ORAL_CAPSULE | Freq: Every day | ORAL | Status: DC

## 2013-10-30 MED ORDER — CLONIDINE HCL 0.1 MG PO TABS
0.2000 mg | ORAL_TABLET | Freq: Once | ORAL | Status: AC
  Administered 2013-10-30: 0.2 mg via ORAL

## 2013-10-30 MED ORDER — LOSARTAN POTASSIUM-HCTZ 100-25 MG PO TABS: 1.0000 | ORAL_TABLET | Freq: Every day | ORAL | Status: DC

## 2013-10-30 MED ORDER — NICOTINE 21 MG/24HR TD PT24: 21.0000 mg | MEDICATED_PATCH | Freq: Every day | TRANSDERMAL | Status: DC

## 2013-10-30 MED ORDER — ATENOLOL 50 MG PO TABS: 50.0000 mg | ORAL_TABLET | Freq: Every day | ORAL | Status: DC

## 2013-10-30 NOTE — Patient Instructions (Signed)
DASH Diet  The DASH diet stands for "Dietary Approaches to Stop Hypertension." It is a healthy eating plan that has been shown to reduce high blood pressure (hypertension) in as little as 14 days, while also possibly providing other significant health benefits. These other health benefits include reducing the risk of breast cancer after menopause and reducing the risk of type 2 diabetes, heart disease, colon cancer, and stroke. Health benefits also include weight loss and slowing kidney failure in patients with chronic kidney disease.   DIET GUIDELINES  · Limit salt (sodium). Your diet should contain less than 1500 mg of sodium daily.  · Limit refined or processed carbohydrates. Your diet should include mostly whole grains. Desserts and added sugars should be used sparingly.  · Include small amounts of heart-healthy fats. These types of fats include nuts, oils, and tub margarine. Limit saturated and trans fats. These fats have been shown to be harmful in the body.  CHOOSING FOODS   The following food groups are based on a 2000 calorie diet. See your Registered Dietitian for individual calorie needs.  Grains and Grain Products (6 to 8 servings daily)  · Eat More Often: Whole-wheat bread, brown rice, whole-grain or wheat pasta, quinoa, popcorn without added fat or salt (air popped).  · Eat Less Often: White bread, white pasta, white rice, cornbread.  Vegetables (4 to 5 servings daily)  · Eat More Often: Fresh, frozen, and canned vegetables. Vegetables may be raw, steamed, roasted, or grilled with a minimal amount of fat.  · Eat Less Often/Avoid: Creamed or fried vegetables. Vegetables in a cheese sauce.  Fruit (4 to 5 servings daily)  · Eat More Often: All fresh, canned (in natural juice), or frozen fruits. Dried fruits without added sugar. One hundred percent fruit juice (½ cup [237 mL] daily).  · Eat Less Often: Dried fruits with added sugar. Canned fruit in light or heavy syrup.  Lean Meats, Fish, and Poultry (2  servings or less daily. One serving is 3 to 4 oz [85-114 g]).  · Eat More Often: Ninety percent or leaner ground beef, tenderloin, sirloin. Round cuts of beef, chicken breast, turkey breast. All fish. Grill, bake, or broil your meat. Nothing should be fried.  · Eat Less Often/Avoid: Fatty cuts of meat, turkey, or chicken leg, thigh, or wing. Fried cuts of meat or fish.  Dairy (2 to 3 servings)  · Eat More Often: Low-fat or fat-free milk, low-fat plain or light yogurt, reduced-fat or part-skim cheese.  · Eat Less Often/Avoid: Milk (whole, 2%). Whole milk yogurt. Full-fat cheeses.  Nuts, Seeds, and Legumes (4 to 5 servings per week)  · Eat More Often: All without added salt.  · Eat Less Often/Avoid: Salted nuts and seeds, canned beans with added salt.  Fats and Sweets (limited)  · Eat More Often: Vegetable oils, tub margarines without trans fats, sugar-free gelatin. Mayonnaise and salad dressings.  · Eat Less Often/Avoid: Coconut oils, palm oils, butter, stick margarine, cream, half and half, cookies, candy, pie.  FOR MORE INFORMATION  The Dash Diet Eating Plan: www.dashdiet.org  Document Released: 08/18/2011 Document Revised: 11/21/2011 Document Reviewed: 08/18/2011  [employer]® Patient Information ©2014 [employer].

## 2013-10-30 NOTE — Progress Notes (Signed)
Patient here for follow up Complains of pain to right leg States leg has been "giving out" lately Blood pressure elevated at this visit

## 2013-10-30 NOTE — Progress Notes (Signed)
MRN: [id number] Name: Tiffany Brady  Sex: female Age: 53 y.o. DOB: 1961-01-18  Allergies: Review of patient's allergies indicates no known allergies.  Chief Complaint  Patient presents with  . Follow-up    HPI: Patient is 53 y.o. female who has history of hypertension, chronic pain syndrome, nicotine dependence, comes today for followup she then out of her blood pressure medication for the last 3 days, today her blood pressure was elevated, was given 0.2 clonidine, repeat blood pressure manual is 160/80, she denies any headache dizziness chest pain or shortness of breath complaining of chronic body pain, she was prescribed Cymbalta which she ran out since then her symptoms got worse, denies any urinary or  bowel symptoms . Patient is to smoke cigarettes, advised to quit smoking she is going to try nicotine patch. As per patient she had a colonoscopy done a few years ago, has not had mammogram done recently.  Past Medical History  Diagnosis Date  . Hypertension     Past Surgical History  Procedure Laterality Date  . Foot surgery Right       Medication List       This list is accurate as of: 10/30/13  3:24 PM.  Always use your most recent med list.               atenolol 50 MG tablet  Commonly known as:  TENORMIN  Take 1 tablet (50 mg total) by mouth daily.     azithromycin 250 MG tablet  Commonly known as:  ZITHROMAX  Take 500 mg today 07/23/13 and then 250 mg daily for 4 days     butalbital-acetaminophen-caffeine 50-325-40 MG per tablet  Commonly known as:  FIORICET, ESGIC  TAKE 1-2 TABLETS BY MOUTH EVERY 6 HOURS AS NEEDED FOR HEADACHE OR MIGRAINE.     DULoxetine 20 MG capsule  Commonly known as:  CYMBALTA  Take 1 capsule (20 mg total) by mouth daily.     fexofenadine-pseudoephedrine 180-240 MG per 24 hr tablet  Commonly known as:  ALLEGRA-D 24 HOUR  Take 1 tablet by mouth daily.     losartan-hydrochlorothiazide 100-25 MG per tablet  Commonly known as:   HYZAAR  Take 1 tablet by mouth daily.     nicotine 21 mg/24hr patch  Commonly known as:  NICODERM CQ  Place 1 patch (21 mg total) onto the skin daily.        Meds ordered this encounter  Medications  . cloNIDine (CATAPRES) tablet 0.2 mg    Sig:   . nicotine (NICODERM CQ) 21 mg/24hr patch    Sig: Place 1 patch (21 mg total) onto the skin daily.    Dispense:  28 patch    Refill:  0  . atenolol (TENORMIN) 50 MG tablet    Sig: Take 1 tablet (50 mg total) by mouth daily.    Dispense:  30 tablet    Refill:  3  . DULoxetine (CYMBALTA) 20 MG capsule    Sig: Take 1 capsule (20 mg total) by mouth daily.    Dispense:  30 capsule    Refill:  1  . losartan-hydrochlorothiazide (HYZAAR) 100-25 MG per tablet    Sig: Take 1 tablet by mouth daily.    Dispense:  30 tablet    Refill:  3    Immunization History  Administered Date(s) Administered  . Influenza,inj,Quad PF,36+ Mos 10/30/2013  . Pneumococcal Polysaccharide-23 10/30/2013    History reviewed. No pertinent family history.  History  Substance Use  Topics  . Smoking status: Current Every Day Smoker    Types: Cigarettes  . Smokeless tobacco: Not on file  . Alcohol Use: No    Review of Systems   As noted in HPI  Filed Vitals:   10/30/13 1500  BP: 160/80  Pulse:   Temp:   Resp:     Physical Exam  Physical Exam  Constitutional:  Obese female sitting comfortably not in acute distress  Cardiovascular: Normal rate and regular rhythm.   Pulmonary/Chest: Breath sounds normal. No respiratory distress. She has no wheezes. She has no rales.  Musculoskeletal:   tenderness points on the upper and lower back, shoulders good range of motion. Strength equal in upper and lower extremities.    CBC    Component Value Date/Time   WBC 8.8 06/05/2013 1841   RBC 5.13* 06/05/2013 1841   HGB 15.4* 06/05/2013 1841   HCT 46.8* 06/05/2013 1841   PLT 202 06/05/2013 1841   MCV 91.2 06/05/2013 1841   LYMPHSABS 2.4 06/05/2013 1841    MONOABS 0.4 06/05/2013 1841   EOSABS 0.3 06/05/2013 1841   BASOSABS 0.0 06/05/2013 1841    CMP     Component Value Date/Time   NA 144 06/05/2013 1841   K 3.8 06/05/2013 1841   CL 106 06/05/2013 1841   CO2 27 06/05/2013 1841   GLUCOSE 110* 06/05/2013 1841   BUN 15 06/05/2013 1841   CREATININE 1.26* 06/05/2013 1841   CALCIUM 9.1 06/05/2013 1841   PROT 7.7 06/05/2013 1841   ALBUMIN 3.3* 06/05/2013 1841   AST 15 06/05/2013 1841   ALT 18 06/05/2013 1841   ALKPHOS 89 06/05/2013 1841   BILITOT 0.6 06/05/2013 1841   GFRNONAA 48* 06/05/2013 1841   GFRAA 55* 06/05/2013 1841    No results found for this basename: chol,  tri,  ldl    No components found with this basename: hga1c    Lab Results  Component Value Date/Time   AST 15 06/05/2013  6:41 PM    Assessment and Plan  Hypertension - Plan: cloNIDine (CATAPRES) tablet 0.2 mg given in the office repeat blood pressure is 160/80, resume back on Tenormin and Hyzaar, fasting blood chemistry COMPLETE METABOLIC PANEL WITH GFR, atenolol (TENORMIN) 50 MG tablet, losartan-hydrochlorothiazide (HYZAAR) 100-25 MG per tablet Also advise for- DASH diet. Nicotine dependence - Plan: nicotine (NICODERM CQ) 21 mg/24hr patch  Chronic pain syndrome - Plan: DULoxetine (CYMBALTA) 20 MG capsule  Needs flu shot  Need for prophylactic vaccination against Streptococcus pneumoniae (pneumococcus) - Plan: Pneumococcal polysaccharide vaccine 23-valent greater than or equal to 2yo subcutaneous/IM  Screening - Plan: MM DIGITAL SCREENING BILATERAL, CBC with Differential, Lipid panel, TSH, Vit D  25 hydroxy (rtn osteoporosis monitoring)  Need for prophylactic vaccination and inoculation against influenza   Health Maintenance -Colonoscopy: uptodate -Pap Smear: schedule with Dr. Sherral Hammers  -Mammogram: ordered  -Vaccinations:    -PNA (PPSV23) (one dose after 65) (or one dose before 65 if chronic conditions)    -Influenza  Return in about 3 months (around 01/27/2014) for BP  check in 2 weeks .  Lorayne Marek, MD

## 2013-11-13 ENCOUNTER — Ambulatory Visit: Payer: No Typology Code available for payment source | Attending: Internal Medicine

## 2013-11-13 DIAGNOSIS — Z09 Encounter for follow-up examination after completed treatment for conditions other than malignant neoplasm: Secondary | ICD-10-CM | POA: Insufficient documentation

## 2013-11-13 NOTE — Progress Notes (Signed)
Pt is here for BP check only. 

## 2013-11-13 NOTE — Patient Instructions (Signed)
Pt is to keep taking medications as prescribed.

## 2013-11-14 ENCOUNTER — Encounter: Payer: Self-pay | Admitting: Family Medicine

## 2013-11-14 ENCOUNTER — Other Ambulatory Visit (HOSPITAL_COMMUNITY)
Admission: RE | Admit: 2013-11-14 | Discharge: 2013-11-14 | Disposition: A | Discharge status: Home / Self Care | Payer: No Typology Code available for payment source | Source: Ambulatory Visit | Attending: Internal Medicine | Admitting: Internal Medicine

## 2013-11-14 ENCOUNTER — Ambulatory Visit: Payer: No Typology Code available for payment source | Attending: Internal Medicine | Admitting: Family Medicine

## 2013-11-14 VITALS — BP 150/90 | HR 76 | Temp 97.6°F | Resp 14 | Ht 64.0 in | Wt 265.0 lb

## 2013-11-14 DIAGNOSIS — G4700 Insomnia, unspecified: Secondary | ICD-10-CM | POA: Insufficient documentation

## 2013-11-14 DIAGNOSIS — Z7251 High risk heterosexual behavior: Secondary | ICD-10-CM

## 2013-11-14 DIAGNOSIS — N898 Other specified noninflammatory disorders of vagina: Secondary | ICD-10-CM | POA: Insufficient documentation

## 2013-11-14 DIAGNOSIS — N760 Acute vaginitis: Secondary | ICD-10-CM | POA: Insufficient documentation

## 2013-11-14 DIAGNOSIS — Z113 Encounter for screening for infections with a predominantly sexual mode of transmission: Secondary | ICD-10-CM | POA: Insufficient documentation

## 2013-11-14 DIAGNOSIS — I10 Essential (primary) hypertension: Secondary | ICD-10-CM

## 2013-11-14 MED ORDER — CLONIDINE HCL 0.1 MG PO TABS
0.1000 mg | ORAL_TABLET | Freq: Once | ORAL | Status: AC
  Administered 2013-11-14: 0.1 mg via ORAL

## 2013-11-14 NOTE — Progress Notes (Signed)
Subjective:     Patient ID: Tiffany Brady, female   DOB: [birth year], 53 y.o.   MRN: [id number]  HPI Tiffany Brady is a 53 y.o. female who presents today with vaginal discharge and inability to stay asleep. She says her sleep problems started 2 years ago but has worsened since she quit her job in May. She has one cup of coffee daily and does not nap. She says she will lay down a midnight and stay awake until 430 a.m. and awake at 730 a.m. She states she has had 3 sexual partners over the last 12 months who occasionally use protection and her last pelvic exam was 2013. She is requesting STD testing. Her vaginal discharge started one week ago it is thin, white and has a mild odor. She is not experiencing any pain or itching associated with the discharge. She has not tried anything to treat it at home. Her mammogram is scheduled for April 20th 2015. She has smoked for 43 years and does not abuse alcohol or drugs.  Review of Systems Her review of systems is otherwise unremarkable.    Objective:   Physical Exam Heart: RRR S1, S2 Lungs: Clear Abdomen: Soft non-distended, normoactive bowel sounds Pelvic:  No masses visualized or palpated upon exam    Assessment:     1. Vaginal discharge insomnia     Plan:     1.Wet Prep and DNA Probe 2. Melatonin Patient informed of plan and advised to follow up in 2-3 months and as needed.

## 2013-11-14 NOTE — Progress Notes (Signed)
Patient is here for a pap smear. Needs refills on all medication. Patient complains of having trouble sleeping x1 year. Also complains of tiredness and headaches. No OTC medications. Complains of Rt leg pain x3 weeks. Has trouble trying to walk after sitting. Leg feels it may give out while trying to walk. Patient has not taken hypertension medication today, BP today of 150/90, Pulse of 76. Patient also states of numbness and pain in hands x2 days.

## 2013-11-14 NOTE — Patient Instructions (Addendum)
Insomnia Insomnia is frequent trouble falling and/or staying asleep. Insomnia can be a long term problem or a short term problem. Both are common. Insomnia can be a short term problem when the wakefulness is related to a certain stress or worry. Long term insomnia is often related to ongoing stress during waking hours and/or poor sleeping habits. Overtime, sleep deprivation itself can make the problem worse. Every little thing feels more severe because you are overtired and your ability to cope is decreased. CAUSES   Stress, anxiety, and depression.  Poor sleeping habits.  Distractions such as TV in the bedroom.  Naps close to bedtime.  Engaging in emotionally charged conversations before bed.  Technical reading before sleep.  Alcohol and other sedatives. They may make the problem worse. They can hurt normal sleep patterns and normal dream activity.  Stimulants such as caffeine for several hours prior to bedtime.  Pain syndromes and shortness of breath can cause insomnia.  Exercise late at night.  Changing time zones may cause sleeping problems (jet lag). It is sometimes helpful to have someone observe your sleeping patterns. They should look for periods of not breathing during the night (sleep apnea). They should also look to see how long those periods last. If you live alone or observers are uncertain, you can also be observed at a sleep clinic where your sleep patterns will be professionally monitored. Sleep apnea requires a checkup and treatment. Give your caregivers your medical history. Give your caregivers observations your family has made about your sleep.  SYMPTOMS   Not feeling rested in the morning.  Anxiety and restlessness at bedtime.  Difficulty falling and staying asleep. TREATMENT   Your caregiver may prescribe treatment for an underlying medical disorders. Your caregiver can give advice or help if you are using alcohol or other drugs for self-medication. Treatment  of underlying problems will usually eliminate insomnia problems.  Medications can be prescribed for short time use. They are generally not recommended for lengthy use.  Over-the-counter sleep medicines are not recommended for lengthy use. They can be habit forming.  You can promote easier sleeping by making lifestyle changes such as:  Using relaxation techniques that help with breathing and reduce muscle tension.  Exercising earlier in the day.  Changing your diet and the time of your last meal. No night time snacks.  Establish a regular time to go to bed.  Counseling can help with stressful problems and worry.  Soothing music and white noise may be helpful if there are background noises you cannot remove.  Stop tedious detailed work at least one hour before bedtime. HOME CARE INSTRUCTIONS   Keep a diary. Inform your caregiver about your progress. This includes any medication side effects. See your caregiver regularly. Take note of:  Times when you are asleep.  Times when you are awake during the night.  The quality of your sleep.  How you feel the next day. This information will help your caregiver care for you.  Get out of bed if you are still awake after 15 minutes. Read or do some quiet activity. Keep the lights down. Wait until you feel sleepy and go back to bed.  Keep regular sleeping and waking hours. Avoid naps.  Exercise regularly.  Avoid distractions at bedtime. Distractions include watching television or engaging in any intense or detailed activity like attempting to balance the household checkbook.  Develop a bedtime ritual. Keep a familiar routine of bathing, brushing your teeth, climbing into bed at the same   time each night, listening to soothing music. Routines increase the success of falling to sleep faster.  Use relaxation techniques. This can be using breathing and muscle tension release routines. It can also include visualizing peaceful scenes. You can  also help control troubling or intruding thoughts by keeping your mind occupied with boring or repetitive thoughts like the old concept of counting sheep. You can make it more creative like imagining planting one beautiful flower after another in your backyard garden.  During your day, work to eliminate stress. When this is not possible use some of the previous suggestions to help reduce the anxiety that accompanies stressful situations. MAKE SURE YOU:   Understand these instructions.  Will watch your condition.  Will get help right away if you are not doing well or get worse. Document Released: 08/26/2000 Document Revised: 11/21/2011 Document Reviewed: 09/26/2007 [facility] Patient Information 2014 [place]. Melatonin oral capsules and tablets What is this medicine? MELATONIN (mel uh TOH nin) is a dietary supplement. It is promoted to help maintain normal sleep patterns. The FDA has not approved this supplement for any medical use. This supplement may be used for other purposes; ask your health care provider or pharmacist if you have questions. This medicine may be used for other purposes; ask your health care provider or pharmacist if you have questions. COMMON BRAND NAME(S): Melatonex What should I tell my health care provider before I take this medicine? They need to know if you have any of these conditions: -cancer -if you frequently drink alcohol containing drinks -immune system problems -liver disease -seizure disorder -an unusual or allergic reaction to melatonin, other medicines, foods, dyes, or preservatives -pregnant or trying to get pregnant -breast-feeding How should I use this medicine? Take this supplement by mouth with a glass of water. This supplement is usually taken prior to bedtime. Do not chew or crush most tablets or capsules. Some tablets are chewable and are chewed before swallowing. Some tablets are meant to be dissolved in the mouth or under the tongue.  Follow the directions on the package labeling, or take as directed by your health care professional. Do not take this supplement more often than directed. Talk to your pediatrician regarding the use of this supplement in children. This supplement is not recommended for use in children. Overdosage: If you think you have taken too much of this medicine contact a poison control center or emergency room at once. NOTE: This medicine is only for you. Do not share this medicine with others. What if I miss a dose? This does not apply; this medicine is not for regular use. Do not take double or extra doses. What may interact with this medicine? Check with your doctor or healthcare professional if you are taking any of the following medications: -hormone medicines -medicines for blood pressure like nifedipine -medications for anxiety, depression, or other emotional or psychiatric problems -medications for seizures -medications for sleep -other herbal or dietary supplements -stimulant medicines like amphetamine, dextroamphetamine -tamoxifen -treatments for cancer or immune disorders This list may not describe all possible interactions. Give your health care provider a list of all the medicines, herbs, non-prescription drugs, or dietary supplements you use. Also tell them if you smoke, drink alcohol, or use illegal drugs. Some items may interact with your medicine. What should I watch for while using this medicine? See your doctor if your symptoms do not get better or if they get worse. Do not take this supplement for more than 2 weeks unless your  doctor tells you to. You may get drowsy or dizzy. Do not drive, use machinery, or do anything that needs mental alertness until you know how this medicine affects you. Do not stand or sit up quickly, especially if you are an older patient. This reduces the risk of dizzy or fainting spells. Alcohol may interfere with the effect of this medicine. Avoid alcoholic  drinks. Talk to your doctor before you use this supplement if you are currently being treated for an emotional, mental, or sleep problem. This medicine may interfere with your treatment. Herbal or dietary supplements are not regulated like medicines. Rigid quality control standards are not required for dietary supplements. The purity and strength of these products can vary. The safety and effect of this dietary supplement for a certain disease or illness is not well known. This product is not intended to diagnose, treat, cure or prevent any disease. The Food and Drug Administration suggests the following to help consumers protect themselves: -Always read product labels and follow directions. -Natural does not mean a product is safe for humans to take. -Look for products that include USP after the ingredient name. This means that the manufacturer followed the standards of the [name] Pharmacopoeia. -Supplements made or sold by a nationally known food or drug company are more likely to be made under tight controls. You can write to the company for more information about how the product was made. What side effects may I notice from receiving this medicine? Side effects that you should report to your doctor or health care professional as soon as possible: -allergic reactions like skin rash, itching or hives, swelling of the face, lips, or tongue -breathing problems -confusion, forgetful -depressed, nervous, or other mood changes -fast or pounding heartbeat -trouble staying awake or alert during the day Side effects that usually do not require medical attention (report to your doctor or health care professional if they continue or are bothersome): -drowsiness, dizziness -headache -nightmares -upset stomach This list may not describe all possible side effects. Call your doctor for medical advice about side effects. You may report side effects to FDA at 1-800-FDA-1088. Where should I keep my  medicine? Keep out of the reach of children. Store at room temperature or as directed on the package label. Protect from moisture. Throw away any unused supplement after the expiration date. NOTE: This sheet is a summary. It may not cover all possible information. If you have questions about this medicine, talk to your doctor, pharmacist, or health care provider.  2014, [employer]vier/Gold Standard. (2013-01-07 10:51:55) Kegel Exercises The goal of Kegel exercises is to isolate and exercise your pelvic floor muscles. These muscles act as a hammock that supports the rectum, vagina, small intestine, and uterus. As the muscles weaken, the hammock sags and these organs are displaced from their normal positions. Kegel exercises can strengthen your pelvic floor muscles and help you to improve bladder and bowel control, improve sexual response, and help reduce many problems and some discomfort during pregnancy. Kegel exercises can be done anywhere and at any time. HOW TO PERFORM KEGEL EXERCISES 1. Locate your pelvic floor muscles. To do this, squeeze (contract) the muscles that you use when you try to stop the flow of urine. You will feel a tightness in the vaginal area (women) and a tight lift in the rectal area (men and women). 2. When you begin, contract your pelvic muscles tight for 2 5 seconds, then relax them for 2 5 seconds. This is one set. Do 4  5 sets with a short pause in between. 3. Contract your pelvic muscles for 8 10 seconds, then relax them for 8 10 seconds. Do 4 5 sets. If you cannot contract your pelvic muscles for 8 10 seconds, try 5 7 seconds and work your way up to 8 10 seconds. Your goal is 4 5 sets of 10 contractions each day. Keep your stomach, buttocks, and legs relaxed during the exercises. Perform sets of both short and long contractions. Vary your positions. Perform these contractions 3 4 times per day. Perform sets while you are:   Lying in bed in the morning.  Standing at  lunch.  Sitting in the late afternoon.  Lying in bed at night. You should do 40 50 contractions per day. Do not perform more Kegel exercises per day than recommended. Overexercising can cause muscle fatigue. Continue these exercises for for at least 15 20 weeks or as directed by your caregiver. Document Released: 08/15/2012 Document Reviewed: 08/15/2012 [facility] Patient Information 2014 [place], [place].

## 2013-11-15 ENCOUNTER — Other Ambulatory Visit: Payer: Self-pay

## 2013-11-15 DIAGNOSIS — Z0000 Encounter for general adult medical examination without abnormal findings: Secondary | ICD-10-CM

## 2013-11-15 LAB — CERVICOVAGINAL ANCILLARY ONLY
Chlamydia: NEGATIVE
NEISSERIA GONORRHEA: NEGATIVE
WET PREP (BD AFFIRM): NEGATIVE
Wet Prep (BD Affirm): NEGATIVE
Wet Prep (BD Affirm): NEGATIVE

## 2013-11-15 LAB — STD PANEL
HEP B S AG: NEGATIVE
HIV: NONREACTIVE

## 2013-11-18 ENCOUNTER — Telehealth: Payer: Self-pay

## 2013-11-18 NOTE — Telephone Encounter (Signed)
Message copied by Dorothe Pea on Mon Nov 18, 2013 12:38 PM ------      Message from: Doran Heater      Created: Mon Nov 18, 2013  9:42 AM       Please let pt know labs all wnl. Thanks AW ------

## 2013-11-18 NOTE — Telephone Encounter (Signed)
Patient is aware of her lab results 

## 2013-12-02 ENCOUNTER — Other Ambulatory Visit: Payer: Self-pay | Admitting: Internal Medicine

## 2013-12-23 ENCOUNTER — Ambulatory Visit (HOSPITAL_COMMUNITY)
Admission: RE | Admit: 2013-12-23 | Discharge: 2013-12-23 | Disposition: A | Discharge status: Home / Self Care | Payer: No Typology Code available for payment source | Source: Ambulatory Visit | Attending: Internal Medicine | Admitting: Internal Medicine

## 2013-12-23 DIAGNOSIS — Z139 Encounter for screening, unspecified: Secondary | ICD-10-CM

## 2013-12-23 DIAGNOSIS — Z1231 Encounter for screening mammogram for malignant neoplasm of breast: Secondary | ICD-10-CM | POA: Insufficient documentation

## 2014-01-15 ENCOUNTER — Other Ambulatory Visit: Payer: No Typology Code available for payment source

## 2014-01-27 ENCOUNTER — Encounter: Payer: Self-pay | Admitting: Internal Medicine

## 2014-01-27 ENCOUNTER — Ambulatory Visit: Payer: No Typology Code available for payment source | Attending: Internal Medicine | Admitting: Internal Medicine

## 2014-01-27 VITALS — BP 110/72 | HR 72 | Temp 98.8°F | Resp 16 | Wt 270.2 lb

## 2014-01-27 DIAGNOSIS — M5380 Other specified dorsopathies, site unspecified: Secondary | ICD-10-CM

## 2014-01-27 DIAGNOSIS — G894 Chronic pain syndrome: Secondary | ICD-10-CM | POA: Insufficient documentation

## 2014-01-27 DIAGNOSIS — G43909 Migraine, unspecified, not intractable, without status migrainosus: Secondary | ICD-10-CM | POA: Insufficient documentation

## 2014-01-27 DIAGNOSIS — Z79899 Other long term (current) drug therapy: Secondary | ICD-10-CM | POA: Insufficient documentation

## 2014-01-27 DIAGNOSIS — Z139 Encounter for screening, unspecified: Secondary | ICD-10-CM

## 2014-01-27 DIAGNOSIS — F17200 Nicotine dependence, unspecified, uncomplicated: Secondary | ICD-10-CM | POA: Insufficient documentation

## 2014-01-27 DIAGNOSIS — M62830 Muscle spasm of back: Secondary | ICD-10-CM

## 2014-01-27 DIAGNOSIS — M545 Low back pain, unspecified: Secondary | ICD-10-CM

## 2014-01-27 DIAGNOSIS — G8929 Other chronic pain: Secondary | ICD-10-CM

## 2014-01-27 DIAGNOSIS — I10 Essential (primary) hypertension: Secondary | ICD-10-CM | POA: Insufficient documentation

## 2014-01-27 MED ORDER — BUTALBITAL-APAP-CAFFEINE 50-325-40 MG PO TABS
ORAL_TABLET | ORAL | Status: DC
Start: 2014-01-27 — End: 2014-04-03

## 2014-01-27 MED ORDER — METHOCARBAMOL 500 MG PO TABS: 500.0000 mg | ORAL_TABLET | Freq: Every day | ORAL | Status: DC

## 2014-01-27 NOTE — Progress Notes (Signed)
Patient here for follow up  Complains of hand and arm pain and right leg pain Also states she has been having headaches again

## 2014-01-27 NOTE — Progress Notes (Signed)
MRN: [id number] Name: Naraya Stoneberg  Sex: female Age: 53 y.o. DOB: July 15, 1961  Allergies: Review of patient's allergies indicates no known allergies.  Chief Complaint  Patient presents with  . Follow-up    HPI: Patient is 53 y.o. female who has to of hypertension chronic low back pain, chronic pain syndrome comes today for followup reported to have worsening lower back pain symptoms which sometimes radiate to the right lower leg, denies any fever chills, she has been compliant in taking her medication her blood pressure is well controlled her she reported to have symptoms of migraine and is requesting some medication. Patient has not had any recent blood work done.  Past Medical History  Diagnosis Date  . Hypertension     Past Surgical History  Procedure Laterality Date  . Foot surgery Right       Medication List       This list is accurate as of: 01/27/14  5:16 PM.  Always use your most recent med list.               atenolol 50 MG tablet  Commonly known as:  TENORMIN  Take 1 tablet (50 mg total) by mouth daily.     azithromycin 250 MG tablet  Commonly known as:  ZITHROMAX  Take 500 mg today 07/23/13 and then 250 mg daily for 4 days     butalbital-acetaminophen-caffeine 50-325-40 MG per tablet  Commonly known as:  FIORICET, ESGIC  TAKE 1-2 TABLETS BY MOUTH EVERY 6 HOURS AS NEEDED FOR HEADACHE OR MIGRAINE.     DULoxetine 20 MG capsule  Commonly known as:  CYMBALTA  Take 1 capsule (20 mg total) by mouth daily.     fexofenadine-pseudoephedrine 180-240 MG per 24 hr tablet  Commonly known as:  ALLEGRA-D 24 HOUR  Take 1 tablet by mouth daily.     losartan-hydrochlorothiazide 100-25 MG per tablet  Commonly known as:  HYZAAR  Take 1 tablet by mouth daily.     methocarbamol 500 MG tablet  Commonly known as:  ROBAXIN  Take 1 tablet (500 mg total) by mouth at bedtime.     nicotine 21 mg/24hr patch  Commonly known as:  NICODERM CQ  Place 1 patch (21 mg  total) onto the skin daily.        Meds ordered this encounter  Medications  . methocarbamol (ROBAXIN) 500 MG tablet    Sig: Take 1 tablet (500 mg total) by mouth at bedtime.    Dispense:  30 tablet    Refill:  3  . butalbital-acetaminophen-caffeine (FIORICET, ESGIC) 50-325-40 MG per tablet    Sig: TAKE 1-2 TABLETS BY MOUTH EVERY 6 HOURS AS NEEDED FOR HEADACHE OR MIGRAINE.    Dispense:  45 tablet    Refill:  0    Immunization History  Administered Date(s) Administered  . Influenza,inj,Quad PF,36+ Mos 10/30/2013  . Pneumococcal Polysaccharide-23 10/30/2013    History reviewed. No pertinent family history.  History  Substance Use Topics  . Smoking status: Current Every Day Smoker    Types: Cigarettes  . Smokeless tobacco: Not on file  . Alcohol Use: No    Review of Systems   As noted in HPI  Filed Vitals:   01/27/14 1632  BP: 110/72  Pulse: 72  Temp: 98.8 F (37.1 C)  Resp: 16    Physical Exam  Physical Exam  Constitutional:  Hobbies female sitting comfortably not in acute distress  Eyes: EOM are normal. Pupils are equal, round,  and reactive to light.  Cardiovascular: Normal rate and regular rhythm.   Pulmonary/Chest: Breath sounds normal. No respiratory distress. She has no wheezes. She has no rales.  Musculoskeletal:  Some lower lumbar paraspinal tenderness, with SLR test patient complaining of lower back discomfort but denies any shooting pain down to the leg, equal strength both lower extremities DTR 2+    CBC    Component Value Date/Time   WBC 8.8 06/05/2013 1841   RBC 5.13* 06/05/2013 1841   HGB 15.4* 06/05/2013 1841   HCT 46.8* 06/05/2013 1841   PLT 202 06/05/2013 1841   MCV 91.2 06/05/2013 1841   LYMPHSABS 2.4 06/05/2013 1841   MONOABS 0.4 06/05/2013 1841   EOSABS 0.3 06/05/2013 1841   BASOSABS 0.0 06/05/2013 1841    CMP     Component Value Date/Time   NA 144 06/05/2013 1841   K 3.8 06/05/2013 1841   CL 106 06/05/2013 1841   CO2 27 06/05/2013  1841   GLUCOSE 110* 06/05/2013 1841   BUN 15 06/05/2013 1841   CREATININE 1.26* 06/05/2013 1841   CALCIUM 9.1 06/05/2013 1841   PROT 7.7 06/05/2013 1841   ALBUMIN 3.3* 06/05/2013 1841   AST 15 06/05/2013 1841   ALT 18 06/05/2013 1841   ALKPHOS 89 06/05/2013 1841   BILITOT 0.6 06/05/2013 1841   GFRNONAA 48* 06/05/2013 1841   GFRAA 55* 06/05/2013 1841    No results found for this basename: chol, tri, ldl    No components found with this basename: hga1c    Lab Results  Component Value Date/Time   AST 15 06/05/2013  6:41 PM    Assessment and Plan  Essential hypertension, benign Well controlled continue with current medications. Chronic pain syndrome Currently on Cymbalta 20 mg daily.   Chronic low back pain - Plan: DG Lumbar Spine Complete  Back muscle spasm - Plan: methocarbamol (ROBAXIN) 500 MG tablet, have advised patient to apply heating pad.  Screening - Plan: CBC with Differential, COMPLETE METABOLIC PANEL WITH GFR, TSH, Lipid panel, Vit D  25 hydroxy (rtn osteoporosis monitoring)  Migraine headache - Plan: butalbital-acetaminophen-caffeine (FIORICET, ESGIC) 50-325-40 MG per tablet   Return in about 3 months (around 04/29/2014) for hypertension.  Lorayne Marek, MD

## 2014-01-28 ENCOUNTER — Telehealth: Payer: Self-pay

## 2014-01-28 DIAGNOSIS — M62830 Muscle spasm of back: Secondary | ICD-10-CM | POA: Insufficient documentation

## 2014-01-28 LAB — LIPID PANEL
Cholesterol: 141 mg/dL (ref 0–200)
HDL: 44 mg/dL (ref >39)
LDL Cholesterol: 84 mg/dL (ref 0–99)
Total CHOL/HDL Ratio: 3.2 Ratio
Triglycerides: 64 mg/dL (ref <150)
VLDL: 13 mg/dL (ref 0–40)

## 2014-01-28 LAB — CBC WITH DIFFERENTIAL/PLATELET
BASOS PCT: 1 % (ref 0–1)
Basophils Absolute: 0.1 10*3/uL (ref 0.0–0.1)
Eosinophils Absolute: 0.6 10*3/uL (ref 0.0–0.7)
Eosinophils Relative: 6 % — ABNORMAL HIGH (ref 0–5)
HCT: 43.5 % (ref 36.0–46.0)
HEMOGLOBIN: 14.7 g/dL (ref 12.0–15.0)
LYMPHS ABS: 3.5 10*3/uL (ref 0.7–4.0)
Lymphocytes Relative: 36 % (ref 12–46)
MCH: 30.9 pg (ref 26.0–34.0)
MCHC: 33.8 g/dL (ref 30.0–36.0)
MCV: 91.6 fL (ref 78.0–100.0)
MONO ABS: 0.7 10*3/uL (ref 0.1–1.0)
MONOS PCT: 7 % (ref 3–12)
Neutro Abs: 4.9 10*3/uL (ref 1.7–7.7)
Neutrophils Relative %: 50 % (ref 43–77)
Platelets: 265 10*3/uL (ref 150–400)
RBC: 4.75 MIL/uL (ref 3.87–5.11)
RDW: 14 % (ref 11.5–15.5)
WBC: 9.7 10*3/uL (ref 4.0–10.5)

## 2014-01-28 LAB — COMPLETE METABOLIC PANEL WITH GFR
ALK PHOS: 67 U/L (ref 39–117)
ALT: 11 U/L (ref 0–35)
AST: 15 U/L (ref 0–37)
Albumin: 3.7 g/dL (ref 3.5–5.2)
BUN: 15 mg/dL (ref 6–23)
CALCIUM: 9.5 mg/dL (ref 8.4–10.5)
CHLORIDE: 102 meq/L (ref 96–112)
CO2: 32 meq/L (ref 19–32)
Creat: 1.2 mg/dL — ABNORMAL HIGH (ref 0.50–1.10)
GFR, EST AFRICAN AMERICAN: 59 mL/min — AB
GFR, Est Non African American: 51 mL/min — ABNORMAL LOW
GLUCOSE: 78 mg/dL (ref 70–99)
POTASSIUM: 4 meq/L (ref 3.5–5.3)
Sodium: 140 mEq/L (ref 135–145)
Total Bilirubin: 0.9 mg/dL (ref 0.2–1.2)
Total Protein: 7.7 g/dL (ref 6.0–8.3)

## 2014-01-28 LAB — TSH: TSH: 2.018 u[IU]/mL (ref 0.350–4.500)

## 2014-01-28 LAB — VITAMIN D 25 HYDROXY (VIT D DEFICIENCY, FRACTURES): VIT D 25 HYDROXY: 17 ng/mL — AB (ref 30–89)

## 2014-01-28 MED ORDER — VITAMIN D (ERGOCALCIFEROL) 1.25 MG (50000 UNIT) PO CAPS: 50000.0000 [IU] | ORAL_CAPSULE | ORAL | Status: DC

## 2014-01-28 NOTE — Telephone Encounter (Signed)
Message copied by Dorothe Pea on Tue Jan 28, 2014  3:10 PM ------      Message from: Lorayne Marek      Created: Tue Jan 28, 2014  1:22 PM       Blood work reviewed, noticed low vitamin D, call patient advise to start ergocalciferol 50,000 units once a week for the duration of  12 weeks.       ------

## 2014-01-28 NOTE — Telephone Encounter (Signed)
Patient is aware of her lab results and will pick up prescription At [facility]

## 2014-01-29 ENCOUNTER — Encounter: Payer: Self-pay | Admitting: Internal Medicine

## 2014-03-31 ENCOUNTER — Ambulatory Visit (HOSPITAL_COMMUNITY): Payer: No Typology Code available for payment source

## 2014-03-31 ENCOUNTER — Ambulatory Visit: Payer: No Typology Code available for payment source

## 2014-03-31 VITALS — BP 179/98 | HR 67 | Temp 98.0°F | Resp 16

## 2014-03-31 MED ORDER — FUROSEMIDE 20 MG PO TABS: 20.0000 mg | ORAL_TABLET | Freq: Every day | ORAL | Status: DC

## 2014-03-31 NOTE — Progress Notes (Unsigned)
Patient presents to walk in clinic c/o pain in arms and right leg. Patient has chronic pain syndrome. Patient states her both her legs have been swollen for 2 weeks. Consulted with Dr. Charlies Silvers. Verbal order received for Lasix 20 mg daily for 5 days, 2 D Echo, and bilateral venous doppler. Echo and Doppler scheduled for 04/03/2014 at [facility]. Patient made aware of her appointment time and place. Vivia Birmingham, RN

## 2014-03-31 NOTE — Patient Instructions (Signed)

## 2014-04-03 ENCOUNTER — Encounter (HOSPITAL_COMMUNITY): Payer: Self-pay | Admitting: Emergency Medicine

## 2014-04-03 ENCOUNTER — Ambulatory Visit (HOSPITAL_COMMUNITY)
Admission: RE | Admit: 2014-04-03 | Discharge: 2014-04-03 | Disposition: A | Discharge status: Home / Self Care | Payer: No Typology Code available for payment source | Source: Ambulatory Visit | Attending: Internal Medicine | Admitting: Internal Medicine

## 2014-04-03 ENCOUNTER — Emergency Department (HOSPITAL_COMMUNITY)
Admission: EM | Admit: 2014-04-03 | Discharge: 2014-04-03 | Disposition: A | Discharge status: Home / Self Care | Payer: No Typology Code available for payment source | Attending: Emergency Medicine | Admitting: Emergency Medicine

## 2014-04-03 DIAGNOSIS — G894 Chronic pain syndrome: Secondary | ICD-10-CM

## 2014-04-03 DIAGNOSIS — I10 Essential (primary) hypertension: Secondary | ICD-10-CM | POA: Insufficient documentation

## 2014-04-03 DIAGNOSIS — M51379 Other intervertebral disc degeneration, lumbosacral region without mention of lumbar back pain or lower extremity pain: Secondary | ICD-10-CM | POA: Insufficient documentation

## 2014-04-03 DIAGNOSIS — G8929 Other chronic pain: Secondary | ICD-10-CM

## 2014-04-03 DIAGNOSIS — M25519 Pain in unspecified shoulder: Secondary | ICD-10-CM | POA: Insufficient documentation

## 2014-04-03 DIAGNOSIS — M7989 Other specified soft tissue disorders: Secondary | ICD-10-CM

## 2014-04-03 DIAGNOSIS — M545 Low back pain, unspecified: Secondary | ICD-10-CM

## 2014-04-03 DIAGNOSIS — M79609 Pain in unspecified limb: Secondary | ICD-10-CM

## 2014-04-03 DIAGNOSIS — M5137 Other intervertebral disc degeneration, lumbosacral region: Secondary | ICD-10-CM | POA: Insufficient documentation

## 2014-04-03 DIAGNOSIS — I517 Cardiomegaly: Secondary | ICD-10-CM

## 2014-04-03 DIAGNOSIS — Z79899 Other long term (current) drug therapy: Secondary | ICD-10-CM | POA: Insufficient documentation

## 2014-04-03 DIAGNOSIS — M47817 Spondylosis without myelopathy or radiculopathy, lumbosacral region: Secondary | ICD-10-CM | POA: Insufficient documentation

## 2014-04-03 DIAGNOSIS — F17200 Nicotine dependence, unspecified, uncomplicated: Secondary | ICD-10-CM | POA: Insufficient documentation

## 2014-04-03 LAB — I-STAT CHEM 8, ED
BUN: 11 mg/dL (ref 6–23)
Calcium, Ion: 1.16 mmol/L (ref 1.12–1.23)
Chloride: 106 mEq/L (ref 96–112)
Creatinine, Ser: 1.3 mg/dL — ABNORMAL HIGH (ref 0.50–1.10)
Glucose, Bld: 101 mg/dL — ABNORMAL HIGH (ref 70–99)
HCT: 45 % (ref 36.0–46.0)
HEMOGLOBIN: 15.3 g/dL — AB (ref 12.0–15.0)
Potassium: 4.1 mEq/L (ref 3.7–5.3)
SODIUM: 141 meq/L (ref 137–147)
TCO2: 24 mmol/L (ref 0–100)

## 2014-04-03 MED ORDER — HYDROMORPHONE HCL PF 1 MG/ML IJ SOLN
2.0000 mg | Freq: Once | INTRAMUSCULAR | Status: AC
  Administered 2014-04-03: 2 mg via INTRAMUSCULAR
  Filled 2014-04-03: qty 2

## 2014-04-03 NOTE — ED Notes (Signed)
Pt undressed, in gown, on monitor, continuous pulse oximetry and blood pressure cuff; warm blanket given

## 2014-04-03 NOTE — Progress Notes (Signed)
VASCULAR LAB PRELIMINARY  PRELIMINARY  PRELIMINARY  PRELIMINARY  Bilateral lower extremity venous duplex completed.    Preliminary report:  Bilateral:  No evidence of DVT, superficial thrombosis, or Baker's Cyst.   Shequilla Goodgame, RVS 04/03/2014, 8:53 AM

## 2014-04-03 NOTE — Discharge Planning (Signed)
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 Patient was educated on the importance of following up with her pcp once discharged, pt verbalized understanding. [facility] case manager will follow up with the pt once discharged. Resources and my contact information provided for any future questions or concerns. No other Community Liaison needs identified at this time.

## 2014-04-03 NOTE — ED Provider Notes (Signed)
CSN: [id number]     Arrival date & time 04/03/14  0912 History   First MD Initiated Contact with Patient 04/03/14 1000     Chief Complaint  Patient presents with  . Extremity Pain     (Consider location/radiation/quality/duration/timing/severity/associated sxs/prior Treatment) HPI Comments:  presents with chronic pain of BL shoulders, arms/hands, and R leg for 1 year, worse this morning after her body was manipulated this morning for and echo and DVT study.  Pain unchanged in location or characterization of pain. It is constant, daily, waxing/waning. She takes cymbalta, fioricet and robaxin without relief. No recent illness, or injury.   The history is provided by the patient. No language interpreter was used.    Past Medical History  Diagnosis Date  . Hypertension    Past Surgical History  Procedure Laterality Date  . Foot surgery Right    History reviewed. No pertinent family history. History  Substance Use Topics  . Smoking status: Current Every Day Smoker -- 0.50 packs/day    Types: Cigarettes  . Smokeless tobacco: Not on file  . Alcohol Use: No   OB History   Grav Para Term Preterm Abortions TAB SAB Ect Mult Living                 Review of Systems  Constitutional: Negative for fever, chills, diaphoresis, activity change, appetite change and fatigue.  HENT: Negative for congestion, facial swelling, rhinorrhea and sore throat.   Eyes: Negative for photophobia and discharge.  Respiratory: Negative for cough, chest tightness and shortness of breath.   Cardiovascular: Negative for chest pain, palpitations and leg swelling.  Gastrointestinal: Negative for nausea, vomiting, abdominal pain and diarrhea.  Endocrine: Negative for polydipsia and polyuria.  Genitourinary: Negative for dysuria, frequency, difficulty urinating and pelvic pain.  Musculoskeletal: Positive for arthralgias and myalgias. Negative for back pain, neck pain and neck stiffness.  Skin: Negative for color  change and wound.  Allergic/Immunologic: Negative for immunocompromised state.  Neurological: Negative for facial asymmetry, weakness, numbness and headaches.  Hematological: Does not bruise/bleed easily.  Psychiatric/Behavioral: Negative for confusion and agitation.      Allergies  Review of patient's allergies indicates no known allergies.  Home Medications   Prior to Admission medications   Medication Sig Start Date End Date Taking? Authorizing Provider  atenolol (TENORMIN) 50 MG tablet Take 1 tablet (50 mg total) by mouth daily. 10/30/13  Yes Lorayne Marek, MD  butalbital-acetaminophen-caffeine (FIORICET, ESGIC) 50-325-40 MG per tablet Take 2 tablets by mouth every 6 (six) hours as needed for headache or migraine.   Yes Historical Provider, MD  DULoxetine (CYMBALTA) 20 MG capsule Take 1 capsule (20 mg total) by mouth daily. 10/30/13  Yes Angelica Chessman, MD  losartan-hydrochlorothiazide (HYZAAR) 100-25 MG per tablet Take 1 tablet by mouth daily. 10/30/13  Yes Lorayne Marek, MD  methocarbamol (ROBAXIN) 500 MG tablet Take 1 tablet (500 mg total) by mouth at bedtime. 01/27/14  Yes Deepak Advani, MD   BP 168/94  Pulse 73  Temp(Src) 97.7 F (36.5 C) (Oral)  Resp 18  SpO2 96% Physical Exam  Constitutional: She is oriented to person, place, and time. She appears well-developed and well-nourished. No distress.  HENT:  Head: Normocephalic and atraumatic.  Mouth/Throat: No oropharyngeal exudate.  Eyes: Pupils are equal, round, and reactive to light.  Neck: Normal range of motion. Neck supple.  Cardiovascular: Normal rate, regular rhythm and normal heart sounds.  Exam reveals no gallop and no friction rub.   No murmur heard.  Pulmonary/Chest: Effort normal and breath sounds normal. No respiratory distress. She has no wheezes. She has no rales.  Abdominal: Soft. Bowel sounds are normal. She exhibits no distension and no mass. There is no tenderness. There is no rebound and no guarding.    Musculoskeletal: Normal range of motion. She exhibits no edema and no tenderness.       Arms:      Legs: Neurological: She is alert and oriented to person, place, and time.  Skin: Skin is warm and dry.  Psychiatric: She has a normal mood and affect.    ED Course  Procedures (including critical care time) Labs Review Labs Reviewed  I-STAT CHEM 8, ED - Abnormal; Notable for the following:    Creatinine, Ser 1.30 (*)    Glucose, Bld 101 (*)    Hemoglobin 15.3 (*)    All other components within normal limits    Imaging Review Dg Lumbar Spine Complete  04/03/2014   CLINICAL DATA:  Chronic low back pain  EXAM: LUMBAR SPINE - COMPLETE 4+ VIEW  COMPARISON:  None.  FINDINGS: Normal alignment.  No fracture or pars defect  Disc degeneration and large osteophyte at L4-5. Disc degeneration and mild spondylosis L5-S1. Remaining disc spaces appear intact. SI joints intact.  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   Electronically Signed   By: Franchot Gallo M.D.   On: 04/03/2014 09:17     EKG Interpretation None      MDM   Final diagnoses:  Chronic pain syndrome    Pt is a 53 y.o. female with Pmhx as above who presents with chronic pain of BL shoulders, arms/hands, and R leg for 1 year, worse this morning after her body was manipulated this morning for and echo and DVT study.  Pain unchanged in location or characterization of pain. I stat chem 8 done with mild Cr elevation. As pain widespread, but she remains well appearing, will not pursue imaging. Will refer to pain mgmt. She can also continue to follow with PCP. No Rxs given.         Neta Ehlers, MD 04/03/14 [phone]

## 2014-04-03 NOTE — Discharge Instructions (Signed)

## 2014-04-03 NOTE — ED Notes (Signed)
Md at bedside

## 2014-04-03 NOTE — ED Notes (Addendum)
Pt states that she has been in pain both hands, arms and right leg for over 1 year.  Pt stated she had an appt to get x-ray and CT and stated that she thought she would come to the ER since she was here.  Pt also stated " they gave me some good pain medication last time I was here that worked really well."

## 2014-04-24 ENCOUNTER — Encounter: Payer: Self-pay | Admitting: Internal Medicine

## 2014-04-24 ENCOUNTER — Ambulatory Visit: Payer: Self-pay | Attending: Internal Medicine

## 2014-04-24 ENCOUNTER — Ambulatory Visit (HOSPITAL_BASED_OUTPATIENT_CLINIC_OR_DEPARTMENT_OTHER): Payer: Self-pay | Admitting: Internal Medicine

## 2014-04-24 VITALS — BP 160/90 | HR 82 | Temp 98.9°F | Resp 18 | Wt 268.0 lb

## 2014-04-24 DIAGNOSIS — I10 Essential (primary) hypertension: Secondary | ICD-10-CM

## 2014-04-24 DIAGNOSIS — Z720 Tobacco use: Secondary | ICD-10-CM | POA: Insufficient documentation

## 2014-04-24 DIAGNOSIS — F17200 Nicotine dependence, unspecified, uncomplicated: Secondary | ICD-10-CM | POA: Insufficient documentation

## 2014-04-24 DIAGNOSIS — G894 Chronic pain syndrome: Secondary | ICD-10-CM

## 2014-04-24 DIAGNOSIS — M545 Low back pain, unspecified: Secondary | ICD-10-CM

## 2014-04-24 DIAGNOSIS — IMO0001 Reserved for inherently not codable concepts without codable children: Secondary | ICD-10-CM

## 2014-04-24 DIAGNOSIS — M62830 Muscle spasm of back: Secondary | ICD-10-CM

## 2014-04-24 DIAGNOSIS — R030 Elevated blood-pressure reading, without diagnosis of hypertension: Secondary | ICD-10-CM

## 2014-04-24 DIAGNOSIS — G8929 Other chronic pain: Secondary | ICD-10-CM

## 2014-04-24 DIAGNOSIS — M5380 Other specified dorsopathies, site unspecified: Secondary | ICD-10-CM | POA: Insufficient documentation

## 2014-04-24 DIAGNOSIS — M47817 Spondylosis without myelopathy or radiculopathy, lumbosacral region: Secondary | ICD-10-CM | POA: Insufficient documentation

## 2014-04-24 MED ORDER — CLONIDINE HCL 0.1 MG PO TABS
0.1000 mg | ORAL_TABLET | Freq: Once | ORAL | Status: AC
  Administered 2014-04-24: 0.1 mg via ORAL

## 2014-04-24 MED ORDER — METHOCARBAMOL 500 MG PO TABS: 500.0000 mg | ORAL_TABLET | Freq: Every day | ORAL | Status: DC

## 2014-04-24 MED ORDER — LOSARTAN POTASSIUM-HCTZ 100-25 MG PO TABS: 1.0000 | ORAL_TABLET | Freq: Every day | ORAL | Status: DC

## 2014-04-24 MED ORDER — ATENOLOL 50 MG PO TABS: 50.0000 mg | ORAL_TABLET | Freq: Every day | ORAL | Status: DC

## 2014-04-24 MED ORDER — DULOXETINE HCL 30 MG PO CPEP: 30.0000 mg | ORAL_CAPSULE | Freq: Every day | ORAL | Status: DC

## 2014-04-24 NOTE — Patient Instructions (Signed)
DASH Eating Plan °DASH stands for "Dietary Approaches to Stop Hypertension." The DASH eating plan is a healthy eating plan that has been shown to reduce high blood pressure (hypertension). Additional health benefits may include reducing the risk of type 2 diabetes mellitus, heart disease, and stroke. The DASH eating plan may also help with weight loss. °WHAT DO I NEED TO KNOW ABOUT THE DASH EATING PLAN? °For the DASH eating plan, you will follow these general guidelines: °· Choose foods with a percent daily value for sodium of less than 5% (as listed on the food label). °· Use salt-free seasonings or herbs instead of table salt or sea salt. °· Check with your health care provider or pharmacist before using salt substitutes. °· Eat lower-sodium products, often labeled as "lower sodium" or "no salt added." °· Eat fresh foods. °· Eat more vegetables, fruits, and low-fat dairy products. °· Choose whole grains. Look for the word "whole" as the first word in the ingredient list. °· Choose fish and skinless chicken or turkey more often than red meat. Limit fish, poultry, and meat to 6 oz (170 g) each day. °· Limit sweets, desserts, sugars, and sugary drinks. °· Choose heart-healthy fats. °· Limit cheese to 1 oz (28 g) per day. °· Eat more home-cooked food and less restaurant, buffet, and fast food. °· Limit fried foods. °· Cook foods using methods other than frying. °· Limit canned vegetables. If you do use them, rinse them well to decrease the sodium. °· When eating at a restaurant, ask that your food be prepared with less salt, or no salt if possible. °WHAT FOODS CAN I EAT? °Seek help from a dietitian for individual calorie needs. °Grains °Whole grain or whole wheat bread. Brown rice. Whole grain or whole wheat pasta. Quinoa, bulgur, and whole grain cereals. Low-sodium cereals. Corn or whole wheat flour tortillas. Whole grain cornbread. Whole grain crackers. Low-sodium crackers. °Vegetables °Fresh or frozen vegetables  (raw, steamed, roasted, or grilled). Low-sodium or reduced-sodium tomato and vegetable juices. Low-sodium or reduced-sodium tomato sauce and paste. Low-sodium or reduced-sodium canned vegetables.  °Fruits °All fresh, canned (in natural juice), or frozen fruits. °Meat and Other Protein Products °Ground beef (85% or leaner), grass-fed beef, or beef trimmed of fat. Skinless chicken or turkey. Ground chicken or turkey. Pork trimmed of fat. All fish and seafood. Eggs. Dried beans, peas, or lentils. Unsalted nuts and seeds. Unsalted canned beans. °Dairy °Low-fat dairy products, such as skim or 1% milk, 2% or reduced-fat cheeses, low-fat ricotta or cottage cheese, or plain low-fat yogurt. Low-sodium or reduced-sodium cheeses. °Fats and Oils °Tub margarines without trans fats. Light or reduced-fat mayonnaise and salad dressings (reduced sodium). Avocado. Safflower, olive, or canola oils. Natural peanut or almond butter. °Other °Unsalted popcorn and pretzels. °The items listed above may not be a complete list of recommended foods or beverages. Contact your dietitian for more options. °WHAT FOODS ARE NOT RECOMMENDED? °Grains °White bread. White pasta. White rice. Refined cornbread. Bagels and croissants. Crackers that contain trans fat. °Vegetables °Creamed or fried vegetables. Vegetables in a cheese sauce. Regular canned vegetables. Regular canned tomato sauce and paste. Regular tomato and vegetable juices. °Fruits °Dried fruits. Canned fruit in light or heavy syrup. Fruit juice. °Meat and Other Protein Products °Fatty cuts of meat. Ribs, chicken wings, bacon, sausage, bologna, salami, chitterlings, fatback, hot dogs, bratwurst, and packaged luncheon meats. Salted nuts and seeds. Canned beans with salt. °Dairy °Whole or 2% milk, cream, half-and-half, and cream cheese. Whole-fat or sweetened yogurt. Full-fat   cheeses or blue cheese. Nondairy creamers and whipped toppings. Processed cheese, cheese spreads, or cheese  curds. °Condiments °Onion and garlic salt, seasoned salt, table salt, and sea salt. Canned and packaged gravies. Worcestershire sauce. Tartar sauce. Barbecue sauce. Teriyaki sauce. Soy sauce, including reduced sodium. Steak sauce. Fish sauce. Oyster sauce. Cocktail sauce. Horseradish. Ketchup and mustard. Meat flavorings and tenderizers. Bouillon cubes. Hot sauce. Tabasco sauce. Marinades. Taco seasonings. Relishes. °Fats and Oils °Butter, stick margarine, lard, shortening, ghee, and bacon fat. Coconut, palm kernel, or palm oils. Regular salad dressings. °Other °Pickles and olives. Salted popcorn and pretzels. °The items listed above may not be a complete list of foods and beverages to avoid. Contact your dietitian for more information. °WHERE CAN I FIND MORE INFORMATION? °National Heart, Lung, and [employer] Institute: www.nhlbi.nih.gov/health/health-topics/topics/dash/ °Document Released: 08/18/2011 Document Revised: 01/13/2014 Document Reviewed: 07/03/2013 °ExitCare® Patient Information ©2015 [employer]. This information is not intended to replace advice given to you by your health care provider. Make sure you discuss any questions you have with your health care provider. ° °

## 2014-04-24 NOTE — Progress Notes (Signed)
Pt is here for persistent bilateral arm/hand/leg pain BP today is 180/107; has not had BP meds x 1 month ++  Denies CP, SOB, HA, weakness, diaphoresis Needing to review meds and refill meds.

## 2014-04-24 NOTE — Progress Notes (Signed)
MRN: [id number] Name: Tiffany Brady  Sex: female Age: 53 y.o. DOB: 07/05/1961  Allergies: Review of patient's allergies indicates no known allergies.  Chief Complaint  Patient presents with  . Follow-up    bilateral hand/arm/leg pain    HPI: Patient is 53 y.o. female who has to of hypertension, chronic pain syndrome, back pain comes today for followup, today her blood pressure is elevated as per patient she ran out of her blood pressure medication for the last one month, she is given clonidine and her repeat manual blood pressure is 160/90, patient denies any headache dizziness chest and shortness of breath complaining of chronic lower back pain, currently patient is on Cymbalta Robaxin, she still smokes cigarettes, I have advised patient to quit smoking. 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 Patient denies any incontinence.  Past Medical History  Diagnosis Date  . Hypertension     Past Surgical History  Procedure Laterality Date  . Foot surgery Right       Medication List       This list is accurate as of: 04/24/14 12:59 PM.  Always use your most recent med list.               atenolol 50 MG tablet  Commonly known as:  TENORMIN  Take 1 tablet (50 mg total) by mouth daily.     butalbital-acetaminophen-caffeine 50-325-40 MG per tablet  Commonly known as:  FIORICET, ESGIC  Take 2 tablets by mouth every 6 (six) hours as needed for headache or migraine.     DULoxetine 30 MG capsule  Commonly known as:  CYMBALTA  Take 1 capsule (30 mg total) by mouth daily.     losartan-hydrochlorothiazide 100-25 MG per tablet  Commonly known as:  HYZAAR  Take 1 tablet by mouth daily.     methocarbamol 500 MG tablet  Commonly known as:  ROBAXIN  Take 1 tablet (500 mg total) by mouth at bedtime.        Meds ordered this encounter  Medications  . atenolol (TENORMIN) 50 MG tablet    Sig: Take 1 tablet (50 mg total) by mouth daily.   Dispense:  30 tablet    Refill:  3  . losartan-hydrochlorothiazide (HYZAAR) 100-25 MG per tablet    Sig: Take 1 tablet by mouth daily.    Dispense:  30 tablet    Refill:  3  . cloNIDine (CATAPRES) tablet 0.1 mg    Sig:   . cloNIDine (CATAPRES) tablet 0.1 mg    Sig:   . DULoxetine (CYMBALTA) 30 MG capsule    Sig: Take 1 capsule (30 mg total) by mouth daily.    Dispense:  120 capsule    Refill:  2  . methocarbamol (ROBAXIN) 500 MG tablet    Sig: Take 1 tablet (500 mg total) by mouth at bedtime.    Dispense:  30 tablet    Refill:  3    Immunization History  Administered Date(s) Administered  . Influenza,inj,Quad PF,36+ Mos 10/30/2013  . Pneumococcal Polysaccharide-23 10/30/2013    No family history on file.  History  Substance Use Topics  . Smoking status: Current Every Day Smoker -- 0.50 packs/day    Types: Cigarettes  . Smokeless tobacco: Not on file  . Alcohol Use: No    Review of Systems   As noted in HPI  Filed Vitals:   04/24/14 1258  BP: 160/90  Pulse:   Temp:   Resp:  Physical Exam  Physical Exam  Constitutional: No distress.  Eyes: EOM are normal. Pupils are equal, round, and reactive to light.  Cardiovascular: Normal rate and regular rhythm.   Pulmonary/Chest: Breath sounds normal. No respiratory distress. She has no wheezes. She has no rales.  Musculoskeletal:  Low lumbar spinal and paraspinal tenderness.    CBC    Component Value Date/Time   WBC 9.7 01/27/2014 1714   RBC 4.75 01/27/2014 1714   HGB 15.3* 04/03/2014 1052   HCT 45.0 04/03/2014 1052   PLT 265 01/27/2014 1714   MCV 91.6 01/27/2014 1714   LYMPHSABS 3.5 01/27/2014 1714   MONOABS 0.7 01/27/2014 1714   EOSABS 0.6 01/27/2014 1714   BASOSABS 0.1 01/27/2014 1714    CMP     Component Value Date/Time   NA 141 04/03/2014 1052   K 4.1 04/03/2014 1052   CL 106 04/03/2014 1052   CO2 32 01/27/2014 1714   GLUCOSE 101* 04/03/2014 1052   BUN 11 04/03/2014 1052   CREATININE 1.30* 04/03/2014  1052   CREATININE 1.20* 01/27/2014 1714   CALCIUM 9.5 01/27/2014 1714   PROT 7.7 01/27/2014 1714   ALBUMIN 3.7 01/27/2014 1714   AST 15 01/27/2014 1714   ALT 11 01/27/2014 1714   ALKPHOS 67 01/27/2014 1714   BILITOT 0.9 01/27/2014 1714   GFRNONAA 51* 01/27/2014 1714   GFRNONAA 48* 06/05/2013 1841   GFRAA 59* 01/27/2014 1714   GFRAA 55* 06/05/2013 1841    Lab Results  Component Value Date/Time   CHOL 141 01/27/2014  5:14 PM    No components found with this basename: hga1c    Lab Results  Component Value Date/Time   AST 15 01/27/2014  5:14 PM    Assessment and Plan  Elevated blood pressure/Essential hypertension, benign - Plan: I advised patient to be compliant with her medication resume back on atenolol (TENORMIN) 50 MG tablet, losartan-hydrochlorothiazide (HYZAAR) 100-25 MG per tablet, cloNIDine (CATAPRES) tablet 0.1 mg, cloNIDine (CATAPRES) tablet 0.1 mg   Chronic pain syndrome - Plan: I have increased the dose of Cymbalta to 30 mg,  DULoxetine (CYMBALTA) 30 MG capsule  Back muscle spasm - Plan: Advised patient to apply heating pad, methocarbamol (ROBAXIN) 500 MG tablet  Chronic low back pain - Plan: Lumbar x-ray 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  Return in about 3 months (around 07/25/2014) for hypertension.  Lorayne Marek, MD

## 2014-05-26 ENCOUNTER — Other Ambulatory Visit: Payer: Self-pay | Admitting: Internal Medicine

## 2014-07-07 ENCOUNTER — Encounter: Payer: Self-pay | Admitting: Internal Medicine

## 2014-07-07 ENCOUNTER — Ambulatory Visit: Payer: Self-pay | Attending: Internal Medicine | Admitting: Internal Medicine

## 2014-07-07 VITALS — BP 116/78 | HR 73 | Temp 98.0°F | Resp 16 | Wt 261.0 lb

## 2014-07-07 DIAGNOSIS — M62838 Other muscle spasm: Secondary | ICD-10-CM | POA: Insufficient documentation

## 2014-07-07 DIAGNOSIS — G894 Chronic pain syndrome: Secondary | ICD-10-CM | POA: Insufficient documentation

## 2014-07-07 DIAGNOSIS — Z1211 Encounter for screening for malignant neoplasm of colon: Secondary | ICD-10-CM

## 2014-07-07 DIAGNOSIS — R0981 Nasal congestion: Secondary | ICD-10-CM | POA: Insufficient documentation

## 2014-07-07 DIAGNOSIS — F17210 Nicotine dependence, cigarettes, uncomplicated: Secondary | ICD-10-CM | POA: Insufficient documentation

## 2014-07-07 DIAGNOSIS — I10 Essential (primary) hypertension: Secondary | ICD-10-CM | POA: Insufficient documentation

## 2014-07-07 DIAGNOSIS — M62830 Muscle spasm of back: Secondary | ICD-10-CM

## 2014-07-07 DIAGNOSIS — J0110 Acute frontal sinusitis, unspecified: Secondary | ICD-10-CM | POA: Insufficient documentation

## 2014-07-07 MED ORDER — FLUTICASONE PROPIONATE 50 MCG/ACT NA SUSP: 2.0000 | Freq: Every day | NASAL | Status: DC

## 2014-07-07 MED ORDER — DULOXETINE HCL 30 MG PO CPEP: 30.0000 mg | ORAL_CAPSULE | Freq: Every day | ORAL | Status: DC

## 2014-07-07 MED ORDER — BACLOFEN 10 MG PO TABS: 10.0000 mg | ORAL_TABLET | Freq: Every day | ORAL | Status: DC

## 2014-07-07 MED ORDER — SULFAMETHOXAZOLE-TMP DS 800-160 MG PO TABS: 1.0000 | ORAL_TABLET | Freq: Two times a day (BID) | ORAL | Status: DC

## 2014-07-07 NOTE — Progress Notes (Signed)
Patient complains of bilateral arm pain Patient also complains of pain to her right leg Pain has been ongoing for a while

## 2014-07-07 NOTE — Progress Notes (Signed)
MRN: [id number] Name: Tiffany Brady  Sex: female Age: 53 y.o. DOB: 1961-01-28  Allergies: Review of patient's allergies indicates no known allergies.  Chief Complaint  Patient presents with  . Leg Pain    HPI: Patient is 53 y.o. female who history of hypertension chronic pain syndrome comes today for followup, complaining of nasal congestion, postnasal drip coughing denies any fever chills chest pain or shortness of breath, patient is to smoke cigarettes, I have advised patient to quit smoking, patient has chronic pain in the lower back, leg, on the last visit her dose of Cymbalta was increased but as per patient she hasn't gotten the prescription and currently taking 20 mg. Patient also takes Robaxin as per patient it does not help her with the symptoms.  Past Medical History  Diagnosis Date  . Hypertension     Past Surgical History  Procedure Laterality Date  . Foot surgery Right       Medication List       This list is accurate as of: 07/07/14 12:50 PM.  Always use your most recent med list.               atenolol 50 MG tablet  Commonly known as:  TENORMIN  Take 1 tablet (50 mg total) by mouth daily.     baclofen 10 MG tablet  Commonly known as:  LIORESAL  Take 1 tablet (10 mg total) by mouth at bedtime.     butalbital-acetaminophen-caffeine 50-325-40 MG per tablet  Commonly known as:  FIORICET, ESGIC  Take 2 tablets by mouth every 6 (six) hours as needed for headache or migraine.     DULoxetine 30 MG capsule  Commonly known as:  CYMBALTA  Take 1 capsule (30 mg total) by mouth daily.     fluticasone 50 MCG/ACT nasal spray  Commonly known as:  FLONASE  Place 2 sprays into both nostrils daily.     losartan-hydrochlorothiazide 100-25 MG per tablet  Commonly known as:  HYZAAR  Take 1 tablet by mouth daily.     methocarbamol 500 MG tablet  Commonly known as:  ROBAXIN  Take 1 tablet (500 mg total) by mouth at bedtime.     sulfamethoxazole-trimethoprim  800-160 MG per tablet  Commonly known as:  BACTRIM DS  Take 1 tablet by mouth 2 (two) times daily.        Meds ordered this encounter  Medications  . baclofen (LIORESAL) 10 MG tablet    Sig: Take 1 tablet (10 mg total) by mouth at bedtime.    Dispense:  30 each    Refill:  2  . sulfamethoxazole-trimethoprim (BACTRIM DS) 800-160 MG per tablet    Sig: Take 1 tablet by mouth 2 (two) times daily.    Dispense:  10 tablet    Refill:  0  . DULoxetine (CYMBALTA) 30 MG capsule    Sig: Take 1 capsule (30 mg total) by mouth daily.    Dispense:  120 capsule    Refill:  2  . fluticasone (FLONASE) 50 MCG/ACT nasal spray    Sig: Place 2 sprays into both nostrils daily.    Dispense:  16 g    Refill:  1    Immunization History  Administered Date(s) Administered  . Influenza,inj,Quad PF,36+ Mos 10/30/2013  . Pneumococcal Polysaccharide-23 10/30/2013    History reviewed. No pertinent family history.  History  Substance Use Topics  . Smoking status: Current Every Day Smoker -- 0.50 packs/day    Types: Cigarettes  .  Smokeless tobacco: Not on file  . Alcohol Use: No    Review of Systems   As noted in HPI  Filed Vitals:   07/07/14 1220  BP: 116/78  Pulse: 73  Temp: 98 F (36.7 C)  Resp: 16    Physical Exam  Physical Exam  Constitutional: No distress.  Eyes: EOM are normal. Pupils are equal, round, and reactive to light.  Cardiovascular: Normal rate and regular rhythm.   Pulmonary/Chest: Breath sounds normal. No respiratory distress. She has no wheezes. She has no rales.  Musculoskeletal: She exhibits no edema.    CBC    Component Value Date/Time   WBC 9.7 01/27/2014 1714   RBC 4.75 01/27/2014 1714   HGB 15.3* 04/03/2014 1052   HCT 45.0 04/03/2014 1052   PLT 265 01/27/2014 1714   MCV 91.6 01/27/2014 1714   LYMPHSABS 3.5 01/27/2014 1714   MONOABS 0.7 01/27/2014 1714   EOSABS 0.6 01/27/2014 1714   BASOSABS 0.1 01/27/2014 1714    CMP     Component Value Date/Time   NA  141 04/03/2014 1052   K 4.1 04/03/2014 1052   CL 106 04/03/2014 1052   CO2 32 01/27/2014 1714   GLUCOSE 101* 04/03/2014 1052   BUN 11 04/03/2014 1052   CREATININE 1.30* 04/03/2014 1052   CREATININE 1.20* 01/27/2014 1714   CALCIUM 9.5 01/27/2014 1714   PROT 7.7 01/27/2014 1714   ALBUMIN 3.7 01/27/2014 1714   AST 15 01/27/2014 1714   ALT 11 01/27/2014 1714   ALKPHOS 67 01/27/2014 1714   BILITOT 0.9 01/27/2014 1714   GFRNONAA 51* 01/27/2014 1714   GFRNONAA 48* 06/05/2013 1841   GFRAA 59* 01/27/2014 1714   GFRAA 55* 06/05/2013 1841    Lab Results  Component Value Date/Time   CHOL 141 01/27/2014  5:14 PM    No components found with this basename: hga1c    Lab Results  Component Value Date/Time   AST 15 01/27/2014  5:14 PM    Assessment and Plan  Essential hypertension, benign  Chronic pain syndrome - Plan: Patient is given new prescription DULoxetine (CYMBALTA) 30 MG capsule  Back muscle spasm - Plan: Discontinue Robaxin, trial of baclofen (LIORESAL) 10 MG tablet each bedtime  Acute frontal sinusitis, recurrence not specified - Plan: sulfamethoxazole-trimethoprim (BACTRIM DS) 800-160 MG per tablet  Nasal congestion - Plan: fluticasone (FLONASE) 50 MCG/ACT nasal spray  Special screening for malignant neoplasms, colon - Plan: Ambulatory referral to Gastroenterology   Health Maintenance -Colonoscopy: referred to GI  : -Mammogram: uptodate    Return in about 3 months (around 10/07/2014) for hypertension.  Lorayne Marek, MD

## 2014-07-31 ENCOUNTER — Encounter (HOSPITAL_COMMUNITY): Payer: Self-pay | Admitting: Emergency Medicine

## 2014-07-31 ENCOUNTER — Emergency Department (HOSPITAL_COMMUNITY): Payer: Self-pay

## 2014-07-31 ENCOUNTER — Emergency Department (HOSPITAL_COMMUNITY)
Admission: EM | Admit: 2014-07-31 | Discharge: 2014-07-31 | Discharge status: Against Medical Advice | Payer: Self-pay | Attending: Emergency Medicine | Admitting: Emergency Medicine

## 2014-07-31 DIAGNOSIS — Z720 Tobacco use: Secondary | ICD-10-CM | POA: Insufficient documentation

## 2014-07-31 DIAGNOSIS — I10 Essential (primary) hypertension: Secondary | ICD-10-CM | POA: Insufficient documentation

## 2014-07-31 DIAGNOSIS — R0602 Shortness of breath: Secondary | ICD-10-CM | POA: Insufficient documentation

## 2014-07-31 NOTE — ED Notes (Addendum)
Pt reports intermittent pain between left arm and right arm for years. Denies injury. Pt also reports sob for several days with exertion sts only when she breaths out of her nose. Had sinus infection last month- denies congestion now. Pt is a smoker

## 2014-08-04 ENCOUNTER — Emergency Department (HOSPITAL_COMMUNITY)
Admission: EM | Admit: 2014-08-04 | Discharge: 2014-08-04 | Disposition: A | Discharge status: Home / Self Care | Payer: Self-pay | Attending: Emergency Medicine | Admitting: Emergency Medicine

## 2014-08-04 ENCOUNTER — Encounter (HOSPITAL_COMMUNITY): Payer: Self-pay | Admitting: Emergency Medicine

## 2014-08-04 DIAGNOSIS — Z792 Long term (current) use of antibiotics: Secondary | ICD-10-CM | POA: Insufficient documentation

## 2014-08-04 DIAGNOSIS — I10 Essential (primary) hypertension: Secondary | ICD-10-CM | POA: Insufficient documentation

## 2014-08-04 DIAGNOSIS — A599 Trichomoniasis, unspecified: Secondary | ICD-10-CM

## 2014-08-04 DIAGNOSIS — Z7951 Long term (current) use of inhaled steroids: Secondary | ICD-10-CM | POA: Insufficient documentation

## 2014-08-04 DIAGNOSIS — Z720 Tobacco use: Secondary | ICD-10-CM | POA: Insufficient documentation

## 2014-08-04 DIAGNOSIS — Z79899 Other long term (current) drug therapy: Secondary | ICD-10-CM | POA: Insufficient documentation

## 2014-08-04 DIAGNOSIS — A5901 Trichomonal vulvovaginitis: Secondary | ICD-10-CM | POA: Insufficient documentation

## 2014-08-04 DIAGNOSIS — Z3202 Encounter for pregnancy test, result negative: Secondary | ICD-10-CM | POA: Insufficient documentation

## 2014-08-04 LAB — WET PREP, GENITAL
CLUE CELLS WET PREP: NONE SEEN
YEAST WET PREP: NONE SEEN

## 2014-08-04 LAB — URINALYSIS, ROUTINE W REFLEX MICROSCOPIC
Glucose, UA: NEGATIVE mg/dL
Ketones, ur: NEGATIVE mg/dL
Nitrite: NEGATIVE
PROTEIN: NEGATIVE mg/dL
Specific Gravity, Urine: 1.027 (ref 1.005–1.030)
UROBILINOGEN UA: 1 mg/dL (ref 0.0–1.0)
pH: 5.5 (ref 5.0–8.0)

## 2014-08-04 LAB — URINE MICROSCOPIC-ADD ON

## 2014-08-04 LAB — RPR

## 2014-08-04 LAB — PREGNANCY, URINE: PREG TEST UR: NEGATIVE

## 2014-08-04 LAB — HIV ANTIBODY (ROUTINE TESTING W REFLEX): HIV 1&2 Ab, 4th Generation: NONREACTIVE

## 2014-08-04 MED ORDER — ONDANSETRON HCL 4 MG PO TABS: 4.0000 mg | ORAL_TABLET | Freq: Four times a day (QID) | ORAL | Status: DC

## 2014-08-04 MED ORDER — METRONIDAZOLE 500 MG PO TABS
2000.0000 mg | ORAL_TABLET | Freq: Once | ORAL | Status: AC
  Administered 2014-08-04: 2000 mg via ORAL
  Filled 2014-08-04: qty 4

## 2014-08-04 MED ORDER — OXYCODONE-ACETAMINOPHEN 5-325 MG PO TABS: 1.0000 | ORAL_TABLET | ORAL | Status: DC | PRN

## 2014-08-04 MED ORDER — ONDANSETRON 4 MG PO TBDP
8.0000 mg | ORAL_TABLET | Freq: Once | ORAL | Status: AC
  Administered 2014-08-04: 8 mg via ORAL
  Filled 2014-08-04: qty 2

## 2014-08-04 MED ORDER — LIDOCAINE HCL (PF) 1 % IJ SOLN
0.9000 mL | Freq: Once | INTRAMUSCULAR | Status: AC
  Administered 2014-08-04: 0.9 mL via INTRADERMAL
  Filled 2014-08-04: qty 5

## 2014-08-04 MED ORDER — OXYCODONE-ACETAMINOPHEN 5-325 MG PO TABS
2.0000 | ORAL_TABLET | Freq: Once | ORAL | Status: AC
  Administered 2014-08-04: 2 via ORAL
  Filled 2014-08-04: qty 2

## 2014-08-04 MED ORDER — CEFTRIAXONE SODIUM 250 MG IJ SOLR
250.0000 mg | Freq: Once | INTRAMUSCULAR | Status: AC
  Administered 2014-08-04: 250 mg via INTRAMUSCULAR
  Filled 2014-08-04: qty 250

## 2014-08-04 MED ORDER — AZITHROMYCIN 250 MG PO TABS
1000.0000 mg | ORAL_TABLET | Freq: Once | ORAL | Status: AC
  Administered 2014-08-04: 1000 mg via ORAL
  Filled 2014-08-04: qty 4

## 2014-08-04 NOTE — ED Notes (Signed)
Pt admits to vaginal discomfort and discharge for the past 3 days, admits to severe discomfort while sitting.  Admits to new sexual partners within the past month.  Denies abdominal discomfort.

## 2014-08-04 NOTE — Discharge Instructions (Signed)

## 2014-08-04 NOTE — ED Provider Notes (Signed)
CSN: [id number]     Arrival date & time 08/04/14  1341 History   First MD Initiated Contact with Patient 08/04/14 1435     Chief Complaint  Patient presents with  . Vaginal Discharge     (Consider location/radiation/quality/duration/timing/severity/associated sxs/prior Treatment) HPI Comments: Patient is a 53 year old female with history of hypertension who presents the emergency department today for evaluation of a pink vaginal discharge. She reports that this has been ongoing since Friday and gradually worsening. She has pain in her vaginal area. She has not noticed any rash. She had intercourse with a new sexual partner 3 weeks ago. No dyspareunia at the time, but the patient has not had intercourse since that time. She denies any dysuria, urinary urgency, urinary frequency, nausea, vomiting, abdominal pain, fever, chills.  Patient is a 53 y.o. female presenting with vaginal discharge. The history is provided by the patient. No language interpreter was used.  Vaginal Discharge Associated symptoms: no abdominal pain, no dyspareunia, no dysuria, no fever, no nausea and no vomiting     Past Medical History  Diagnosis Date  . Hypertension    Past Surgical History  Procedure Laterality Date  . Foot surgery Right    No family history on file. History  Substance Use Topics  . Smoking status: Current Every Day Smoker -- 0.50 packs/day    Types: Cigarettes  . Smokeless tobacco: Not on file  . Alcohol Use: No   OB History    No data available     Review of Systems  Constitutional: Negative for fever and chills.  Respiratory: Negative for shortness of breath.   Cardiovascular: Negative for chest pain.  Gastrointestinal: Negative for nausea, vomiting and abdominal pain.  Genitourinary: Positive for vaginal bleeding, vaginal discharge, vaginal pain and pelvic pain. Negative for dysuria, urgency, frequency, hematuria, difficulty urinating and dyspareunia.  All other systems reviewed  and are negative.     Allergies  Review of patient's allergies indicates no known allergies.  Home Medications   Prior to Admission medications   Medication Sig Start Date End Date Taking? Authorizing Provider  atenolol (TENORMIN) 50 MG tablet Take 1 tablet (50 mg total) by mouth daily. 04/24/14   Lorayne Marek, MD  baclofen (LIORESAL) 10 MG tablet Take 1 tablet (10 mg total) by mouth at bedtime. 07/07/14   Lorayne Marek, MD  butalbital-acetaminophen-caffeine (FIORICET, ESGIC) 50-325-40 MG per tablet Take 2 tablets by mouth every 6 (six) hours as needed for headache or migraine.    Historical Provider, MD  DULoxetine (CYMBALTA) 30 MG capsule Take 1 capsule (30 mg total) by mouth daily. 07/07/14   Lorayne Marek, MD  fluticasone (FLONASE) 50 MCG/ACT nasal spray Place 2 sprays into both nostrils daily. 07/07/14   Lorayne Marek, MD  losartan-hydrochlorothiazide (HYZAAR) 100-25 MG per tablet Take 1 tablet by mouth daily. 04/24/14   Lorayne Marek, MD  methocarbamol (ROBAXIN) 500 MG tablet Take 1 tablet (500 mg total) by mouth at bedtime. 04/24/14   Lorayne Marek, MD  sulfamethoxazole-trimethoprim (BACTRIM DS) 800-160 MG per tablet Take 1 tablet by mouth 2 (two) times daily. 07/07/14   Lorayne Marek, MD   BP 131/66 mmHg  Pulse 97  Temp(Src) 97.4 F (36.3 C) (Oral)  Resp 18  SpO2 96%  LMP 04/21/2014 Physical Exam  Constitutional: She is oriented to person, place, and time. She appears well-developed and well-nourished. No distress.  HENT:  Head: Normocephalic and atraumatic.  Right Ear: External ear normal.  Left Ear: External ear normal.  Nose:  Nose normal.  Mouth/Throat: Oropharynx is clear and moist.  Eyes: Conjunctivae are normal.  Neck: Normal range of motion.  Cardiovascular: Normal rate, regular rhythm and normal heart sounds.   Pulmonary/Chest: Effort normal and breath sounds normal. No stridor. No respiratory distress. She has no wheezes. She has no rales.  Abdominal: Soft. She  exhibits no distension. There is no tenderness. There is no rigidity, no rebound and no guarding.  Genitourinary: There is no rash, tenderness or lesion on the right labia. There is no rash, tenderness or lesion on the left labia. Cervix exhibits discharge. Cervix exhibits no motion tenderness. Right adnexum displays no mass, no tenderness and no fullness. Left adnexum displays no mass, no tenderness and no fullness. There is tenderness in the vagina. No erythema or bleeding in the vagina. No foreign body around the vagina. No signs of injury around the vagina. Vaginal discharge found.  Tenderness with speculum. No tenderness over adnexa or uterus. No CMT. Copious malodorous discharge on exam.   Musculoskeletal: Normal range of motion.  Neurological: She is alert and oriented to person, place, and time. She has normal strength.  Skin: Skin is warm and dry. She is not diaphoretic. No erythema.  Psychiatric: She has a normal mood and affect. Her behavior is normal.  Nursing note and vitals reviewed.   ED Course  Procedures (including critical care time) Labs Review Labs Reviewed  WET PREP, GENITAL - Abnormal; Notable for the following:    Trich, Wet Prep MANY (*)    WBC, Wet Prep HPF POC MANY (*)    All other components within normal limits  URINALYSIS, ROUTINE W REFLEX MICROSCOPIC - Abnormal; Notable for the following:    APPearance CLOUDY (*)    Hgb urine dipstick MODERATE (*)    Bilirubin Urine SMALL (*)    Leukocytes, UA LARGE (*)    All other components within normal limits  URINE MICROSCOPIC-ADD ON - Abnormal; Notable for the following:    Squamous Epithelial / LPF MANY (*)    Bacteria, UA FEW (*)    All other components within normal limits  GC/CHLAMYDIA PROBE AMP  PREGNANCY, URINE  HIV ANTIBODY (ROUTINE TESTING)  RPR    Imaging Review No results found.   EKG Interpretation None      MDM   Final diagnoses:  Trichomoniasis    Patient presents to ED for evaluation  of malodorous vaginal discharge and vaginal pain. Patient with trichomoniasis. No CMT on exam. Tenderness in vagina. No concern for PID, TOA, ovarian torsion. Treated in ED for trichomoniasis and cervicitis. Discussed importance of having partner tested and treated. Discussed she will receive a phone call if gc/chlaymdia, HIV, and RPR are positive. Discussed reasons to return to ED immediately. Vital signs stable. Discussed case with Dr. Eulis Foster who agrees with plan. Patient / Family / Caregiver informed of clinical course, understand medical decision-making process, and agree with plan.     Elwyn Lade, PA-C 08/04/14 [place], MD 08/04/14 [phone]

## 2014-08-05 LAB — GC/CHLAMYDIA PROBE AMP
CT PROBE, AMP APTIMA: NEGATIVE
GC PROBE AMP APTIMA: NEGATIVE

## 2014-09-29 ENCOUNTER — Ambulatory Visit: Payer: Self-pay | Attending: Internal Medicine | Admitting: Internal Medicine

## 2014-09-29 ENCOUNTER — Encounter: Payer: Self-pay | Admitting: Internal Medicine

## 2014-09-29 VITALS — BP 142/88 | HR 74 | Temp 98.0°F | Resp 16 | Wt 270.4 lb

## 2014-09-29 DIAGNOSIS — Z79899 Other long term (current) drug therapy: Secondary | ICD-10-CM | POA: Insufficient documentation

## 2014-09-29 DIAGNOSIS — G894 Chronic pain syndrome: Secondary | ICD-10-CM

## 2014-09-29 DIAGNOSIS — I10 Essential (primary) hypertension: Secondary | ICD-10-CM

## 2014-09-29 DIAGNOSIS — Z23 Encounter for immunization: Secondary | ICD-10-CM

## 2014-09-29 DIAGNOSIS — R3915 Urgency of urination: Secondary | ICD-10-CM

## 2014-09-29 DIAGNOSIS — M2550 Pain in unspecified joint: Secondary | ICD-10-CM

## 2014-09-29 DIAGNOSIS — F17210 Nicotine dependence, cigarettes, uncomplicated: Secondary | ICD-10-CM | POA: Insufficient documentation

## 2014-09-29 LAB — COMPLETE METABOLIC PANEL WITH GFR
ALBUMIN: 3.4 g/dL — AB (ref 3.5–5.2)
ALT: 19 U/L (ref 0–35)
AST: 16 U/L (ref 0–37)
Alkaline Phosphatase: 71 U/L (ref 39–117)
BUN: 15 mg/dL (ref 6–23)
CO2: 27 meq/L (ref 19–32)
Calcium: 8.8 mg/dL (ref 8.4–10.5)
Chloride: 104 mEq/L (ref 96–112)
Creat: 1.25 mg/dL — ABNORMAL HIGH (ref 0.50–1.10)
GFR, Est African American: 56 mL/min — ABNORMAL LOW
GFR, Est Non African American: 49 mL/min — ABNORMAL LOW
GLUCOSE: 102 mg/dL — AB (ref 70–99)
POTASSIUM: 3.7 meq/L (ref 3.5–5.3)
Sodium: 138 mEq/L (ref 135–145)
Total Bilirubin: 0.8 mg/dL (ref 0.2–1.2)
Total Protein: 6.9 g/dL (ref 6.0–8.3)

## 2014-09-29 LAB — RHEUMATOID FACTOR: RHEUMATOID FACTOR: 12 [IU]/mL (ref <=14)

## 2014-09-29 LAB — POCT URINALYSIS DIPSTICK
Bilirubin, UA: NEGATIVE
Glucose, UA: NEGATIVE
Ketones, UA: NEGATIVE
Nitrite, UA: NEGATIVE
PH UA: 5.5
PROTEIN UA: NEGATIVE
SPEC GRAV UA: 1.02
UROBILINOGEN UA: 0.2

## 2014-09-29 LAB — URIC ACID: Uric Acid, Serum: 7.3 mg/dL — ABNORMAL HIGH (ref 2.4–7.0)

## 2014-09-29 MED ORDER — DULOXETINE HCL 30 MG PO CPEP: 30.0000 mg | ORAL_CAPSULE | Freq: Every day | ORAL | Status: DC

## 2014-09-29 MED ORDER — METHYLPREDNISOLONE 4 MG PO KIT: PACK | ORAL | Status: DC

## 2014-09-29 MED ORDER — CIPROFLOXACIN HCL 500 MG PO TABS: 500.0000 mg | ORAL_TABLET | Freq: Two times a day (BID) | ORAL | Status: DC

## 2014-09-29 NOTE — Progress Notes (Signed)
Patient complains of pain to her arms,legs and hands that started about three weeks Ago Complains of pains to her neck and shoulder as well Patient states she has not been sleeping well and has been having headaches again

## 2014-09-29 NOTE — Progress Notes (Signed)
MRN: [id number] Name: Tiffany Brady  Sex: female Age: 54 y.o. DOB: Apr 30, 1961  Allergies: Review of patient's allergies indicates no known allergies.  Chief Complaint  Patient presents with  . Arm Pain    HPI: Patient is 54 y.o. female who has to of hypertension, chronic pain syndrome , as per patient she has not taken her blood pressure medication today, her pressure is borderline elevated, denies any headache dizziness, she does complain of urinary frequency urgency denies any nausea vomiting fever chills, she does have multiple joint pain, she denies any family history of arthritis, she was prescribed Cymbalta in the past as per patient she was going to be enrolled in a program and will check with the pharmacy to get this medication. Has not been taking currently.   Past Medical History  Diagnosis Date  . Hypertension     Past Surgical History  Procedure Laterality Date  . Foot surgery Right       Medication List       This list is accurate as of: 09/29/14 10:49 AM.  Always use your most recent med list.               atenolol 50 MG tablet  Commonly known as:  TENORMIN  Take 1 tablet (50 mg total) by mouth daily.     baclofen 10 MG tablet  Commonly known as:  LIORESAL  Take 1 tablet (10 mg total) by mouth at bedtime.     butalbital-acetaminophen-caffeine 50-325-40 MG per tablet  Commonly known as:  FIORICET, ESGIC  Take 2 tablets by mouth every 6 (six) hours as needed for headache or migraine.     ciprofloxacin 500 MG tablet  Commonly known as:  CIPRO  Take 1 tablet (500 mg total) by mouth 2 (two) times daily.     DULoxetine 30 MG capsule  Commonly known as:  CYMBALTA  Take 1 capsule (30 mg total) by mouth daily.     fluticasone 50 MCG/ACT nasal spray  Commonly known as:  FLONASE  Place 2 sprays into both nostrils daily.     losartan-hydrochlorothiazide 100-25 MG per tablet  Commonly known as:  HYZAAR  Take 1 tablet by mouth daily.     methocarbamol 500 MG tablet  Commonly known as:  ROBAXIN  Take 1 tablet (500 mg total) by mouth at bedtime.     methylPREDNISolone 4 MG tablet  Commonly known as:  MEDROL DOSEPAK  follow package directions     ondansetron 4 MG tablet  Commonly known as:  ZOFRAN  Take 1 tablet (4 mg total) by mouth every 6 (six) hours.     oxyCODONE-acetaminophen 5-325 MG per tablet  Commonly known as:  PERCOCET/ROXICET  Take 1 tablet by mouth every 4 (four) hours as needed for severe pain. May take 2 tablets PO q 6 hours for severe pain - Do not take with Tylenol as this tablet already contains tylenol     sulfamethoxazole-trimethoprim 800-160 MG per tablet  Commonly known as:  BACTRIM DS  Take 1 tablet by mouth 2 (two) times daily.        Meds ordered this encounter  Medications  . methylPREDNISolone (MEDROL DOSEPAK) 4 MG tablet    Sig: follow package directions    Dispense:  21 tablet    Refill:  0  . DULoxetine (CYMBALTA) 30 MG capsule    Sig: Take 1 capsule (30 mg total) by mouth daily.    Dispense:  120 capsule  Refill:  2  . ciprofloxacin (CIPRO) 500 MG tablet    Sig: Take 1 tablet (500 mg total) by mouth 2 (two) times daily.    Dispense:  10 tablet    Refill:  0    Immunization History  Administered Date(s) Administered  . Influenza,inj,Quad PF,36+ Mos 10/30/2013, 09/29/2014  . Pneumococcal Polysaccharide-23 10/30/2013    History reviewed. No pertinent family history.  History  Substance Use Topics  . Smoking status: Current Every Day Smoker -- 0.50 packs/day    Types: Cigarettes  . Smokeless tobacco: Not on file  . Alcohol Use: No    Review of Systems   As noted in HPI  Filed Vitals:   09/29/14 1011  BP: 142/88  Pulse: 74  Temp: 98 F (36.7 C)  Resp: 16    Physical Exam  Physical Exam  Constitutional: No distress.  Eyes: EOM are normal. Pupils are equal, round, and reactive to light.  Cardiovascular: Normal rate and regular rhythm.     Pulmonary/Chest: Breath sounds normal. No respiratory distress. She has no wheezes. She has no rales.  Musculoskeletal:  Hand MCP tenderness     CBC    Component Value Date/Time   WBC 9.7 01/27/2014 1714   RBC 4.75 01/27/2014 1714   HGB 15.3* 04/03/2014 1052   HCT 45.0 04/03/2014 1052   PLT 265 01/27/2014 1714   MCV 91.6 01/27/2014 1714   LYMPHSABS 3.5 01/27/2014 1714   MONOABS 0.7 01/27/2014 1714   EOSABS 0.6 01/27/2014 1714   BASOSABS 0.1 01/27/2014 1714    CMP     Component Value Date/Time   NA 141 04/03/2014 1052   K 4.1 04/03/2014 1052   CL 106 04/03/2014 1052   CO2 32 01/27/2014 1714   GLUCOSE 101* 04/03/2014 1052   BUN 11 04/03/2014 1052   CREATININE 1.30* 04/03/2014 1052   CREATININE 1.20* 01/27/2014 1714   CALCIUM 9.5 01/27/2014 1714   PROT 7.7 01/27/2014 1714   ALBUMIN 3.7 01/27/2014 1714   AST 15 01/27/2014 1714   ALT 11 01/27/2014 1714   ALKPHOS 67 01/27/2014 1714   BILITOT 0.9 01/27/2014 1714   GFRNONAA 51* 01/27/2014 1714   GFRNONAA 48* 06/05/2013 1841   GFRAA 59* 01/27/2014 1714   GFRAA 55* 06/05/2013 1841    Lab Results  Component Value Date/Time   CHOL 141 01/27/2014 05:14 PM    No components found for: HGA1C  Lab Results  Component Value Date/Time   AST 15 01/27/2014 05:14 PM    Assessment and Plan  Chronic pain syndrome - Plan: ?fibromyalgia Resume back on DULoxetine (CYMBALTA) 30 MG capsule  Multiple joint pain - Plan: we'll also check Rheumatoid factor, ANA, Sedimentation Rate, methylPREDNISolone (MEDROL DOSEPAK) 4 MG tablet, Uric Acid  Urinary urgency - Plan:  Results for orders placed or performed in visit on 09/29/14  Urinalysis Dipstick  Result Value Ref Range   Color, UA yellow    Clarity, UA clear    Glucose, UA neg    Bilirubin, UA neg    Ketones, UA neg    Spec Grav, UA 1.020    Blood, UA trace-intact    pH, UA 5.5    Protein, UA neg    Urobilinogen, UA 0.2    Nitrite, UA neg    Leukocytes, UA small (1+)      Urinalysis Dipstick positive for leukocyte Estrace ,prescribed ciprofloxacin (CIPRO) 500 MG tablet,will send Urine culture  Essential hypertension, benign - Plan: will repeat blood chemistry COMPLETE METABOLIC  PANEL WITH GFR  Encounter for immunization Flu shot today.   Health Maintenance  -Mammogram:  uptodate  -Vaccinations:  Flu shot today   Return in about 3 months (around 12/29/2014) for hypertension.  Lorayne Marek, MD

## 2014-09-30 ENCOUNTER — Telehealth: Payer: Self-pay | Admitting: Emergency Medicine

## 2014-09-30 LAB — URINE CULTURE
Colony Count: NO GROWTH
Organism ID, Bacteria: NO GROWTH

## 2014-09-30 LAB — SEDIMENTATION RATE: Sed Rate: 23 mm/hr — ABNORMAL HIGH (ref 0–22)

## 2014-09-30 LAB — ANA: Anti Nuclear Antibody(ANA): NEGATIVE

## 2014-09-30 NOTE — Telephone Encounter (Signed)
-----   Message from Lorayne Marek, MD sent at 09/30/2014 11:32 AM EST ----- Blood work reviewed call and let the patient know that her uric acid level is borderline elevated, advise patient to avoid alcohol and avoid high protein diet

## 2014-09-30 NOTE — Telephone Encounter (Signed)
Pt given lab work results with instructions to avoid alcohol/high protein diet

## 2014-10-27 ENCOUNTER — Ambulatory Visit: Payer: Self-pay

## 2014-10-28 ENCOUNTER — Telehealth: Payer: Self-pay

## 2014-10-28 ENCOUNTER — Other Ambulatory Visit: Payer: Self-pay | Admitting: Internal Medicine

## 2014-10-28 NOTE — Telephone Encounter (Signed)
Returned patient phone call Patient not available Left message on voice mail to return our call 

## 2014-10-30 ENCOUNTER — Ambulatory Visit: Payer: Self-pay | Attending: Internal Medicine

## 2014-10-30 ENCOUNTER — Telehealth: Payer: Self-pay | Admitting: Internal Medicine

## 2014-10-30 ENCOUNTER — Other Ambulatory Visit: Payer: Self-pay | Admitting: Internal Medicine

## 2014-10-30 DIAGNOSIS — IMO0001 Reserved for inherently not codable concepts without codable children: Secondary | ICD-10-CM

## 2014-10-30 DIAGNOSIS — R030 Elevated blood-pressure reading, without diagnosis of hypertension: Principal | ICD-10-CM

## 2014-10-30 MED ORDER — ATENOLOL 50 MG PO TABS: 50.0000 mg | ORAL_TABLET | Freq: Every day | ORAL | Status: DC

## 2014-10-30 MED ORDER — LOSARTAN POTASSIUM-HCTZ 100-25 MG PO TABS: 1.0000 | ORAL_TABLET | Freq: Every day | ORAL | Status: DC

## 2014-10-30 NOTE — Telephone Encounter (Signed)
Pt requesting Rx refills, send to [facility]

## 2014-10-30 NOTE — Telephone Encounter (Signed)
Pt came into clinic wanting to speak to nurse. Please f/u with pt.

## 2014-11-03 ENCOUNTER — Ambulatory Visit: Payer: Self-pay | Attending: Internal Medicine | Admitting: Internal Medicine

## 2014-11-03 ENCOUNTER — Encounter: Payer: Self-pay | Admitting: Internal Medicine

## 2014-11-03 VITALS — BP 127/79 | HR 72 | Temp 98.0°F | Resp 16 | Wt 261.2 lb

## 2014-11-03 DIAGNOSIS — M62830 Muscle spasm of back: Secondary | ICD-10-CM

## 2014-11-03 DIAGNOSIS — I10 Essential (primary) hypertension: Secondary | ICD-10-CM

## 2014-11-03 DIAGNOSIS — E790 Hyperuricemia without signs of inflammatory arthritis and tophaceous disease: Secondary | ICD-10-CM

## 2014-11-03 DIAGNOSIS — R7989 Other specified abnormal findings of blood chemistry: Secondary | ICD-10-CM

## 2014-11-03 DIAGNOSIS — G894 Chronic pain syndrome: Secondary | ICD-10-CM

## 2014-11-03 MED ORDER — LOSARTAN POTASSIUM-HCTZ 100-25 MG PO TABS
1.0000 | ORAL_TABLET | Freq: Every day | ORAL | Status: DC
Start: 2014-11-03 — End: 2015-03-17

## 2014-11-03 MED ORDER — ALLOPURINOL 100 MG PO TABS: 100.0000 mg | ORAL_TABLET | Freq: Every day | ORAL | Status: DC

## 2014-11-03 MED ORDER — DULOXETINE HCL 30 MG PO CPEP: 30.0000 mg | ORAL_CAPSULE | Freq: Every day | ORAL | Status: DC

## 2014-11-03 MED ORDER — GABAPENTIN 300 MG PO CAPS: 300.0000 mg | ORAL_CAPSULE | Freq: Every day | ORAL | Status: DC

## 2014-11-03 MED ORDER — ATENOLOL 50 MG PO TABS: 50.0000 mg | ORAL_TABLET | Freq: Every day | ORAL | Status: DC

## 2014-11-03 NOTE — Patient Instructions (Signed)

## 2014-11-03 NOTE — Progress Notes (Signed)
Patient complains of pain to her right leg that is getting worse Patient states is keeping her up at night Patient also complains of pain to her hands and arms

## 2014-11-05 ENCOUNTER — Other Ambulatory Visit: Payer: Self-pay | Admitting: Internal Medicine

## 2014-11-05 DIAGNOSIS — G894 Chronic pain syndrome: Secondary | ICD-10-CM

## 2014-11-05 MED ORDER — DULOXETINE HCL 30 MG PO CPEP: 30.0000 mg | ORAL_CAPSULE | Freq: Every day | ORAL | Status: DC

## 2014-11-21 ENCOUNTER — Ambulatory Visit: Payer: Self-pay

## 2014-12-10 ENCOUNTER — Ambulatory Visit: Payer: Self-pay | Attending: Internal Medicine | Admitting: Internal Medicine

## 2014-12-10 ENCOUNTER — Encounter: Payer: Self-pay | Admitting: Internal Medicine

## 2014-12-10 VITALS — BP 150/90 | HR 78 | Temp 97.8°F | Resp 16 | Wt 261.6 lb

## 2014-12-10 DIAGNOSIS — Z792 Long term (current) use of antibiotics: Secondary | ICD-10-CM | POA: Insufficient documentation

## 2014-12-10 DIAGNOSIS — R7989 Other specified abnormal findings of blood chemistry: Secondary | ICD-10-CM

## 2014-12-10 DIAGNOSIS — Z720 Tobacco use: Secondary | ICD-10-CM

## 2014-12-10 DIAGNOSIS — R202 Paresthesia of skin: Secondary | ICD-10-CM

## 2014-12-10 DIAGNOSIS — F17200 Nicotine dependence, unspecified, uncomplicated: Secondary | ICD-10-CM

## 2014-12-10 DIAGNOSIS — F17210 Nicotine dependence, cigarettes, uncomplicated: Secondary | ICD-10-CM | POA: Insufficient documentation

## 2014-12-10 DIAGNOSIS — Z7951 Long term (current) use of inhaled steroids: Secondary | ICD-10-CM | POA: Insufficient documentation

## 2014-12-10 DIAGNOSIS — E790 Hyperuricemia without signs of inflammatory arthritis and tophaceous disease: Secondary | ICD-10-CM | POA: Insufficient documentation

## 2014-12-10 DIAGNOSIS — R208 Other disturbances of skin sensation: Secondary | ICD-10-CM | POA: Insufficient documentation

## 2014-12-10 DIAGNOSIS — M2550 Pain in unspecified joint: Secondary | ICD-10-CM | POA: Insufficient documentation

## 2014-12-10 DIAGNOSIS — I10 Essential (primary) hypertension: Secondary | ICD-10-CM | POA: Insufficient documentation

## 2014-12-10 DIAGNOSIS — G894 Chronic pain syndrome: Secondary | ICD-10-CM | POA: Insufficient documentation

## 2014-12-10 MED ORDER — CYCLOBENZAPRINE HCL 10 MG PO TABS: 10.0000 mg | ORAL_TABLET | Freq: Every day | ORAL | Status: DC

## 2014-12-10 MED ORDER — METHYLPREDNISOLONE 4 MG PO KIT: PACK | ORAL | Status: DC

## 2014-12-10 NOTE — Patient Instructions (Signed)
DASH Eating Plan °DASH stands for "Dietary Approaches to Stop Hypertension." The DASH eating plan is a healthy eating plan that has been shown to reduce high blood pressure (hypertension). Additional health benefits may include reducing the risk of type 2 diabetes mellitus, heart disease, and stroke. The DASH eating plan may also help with weight loss. °WHAT DO I NEED TO KNOW ABOUT THE DASH EATING PLAN? °For the DASH eating plan, you will follow these general guidelines: °· Choose foods with a percent daily value for sodium of less than 5% (as listed on the food label). °· Use salt-free seasonings or herbs instead of table salt or sea salt. °· Check with your health care provider or pharmacist before using salt substitutes. °· Eat lower-sodium products, often labeled as "lower sodium" or "no salt added." °· Eat fresh foods. °· Eat more vegetables, fruits, and low-fat dairy products. °· Choose whole grains. Look for the word "whole" as the first word in the ingredient list. °· Choose fish and skinless chicken or turkey more often than red meat. Limit fish, poultry, and meat to 6 oz (170 g) each day. °· Limit sweets, desserts, sugars, and sugary drinks. °· Choose heart-healthy fats. °· Limit cheese to 1 oz (28 g) per day. °· Eat more home-cooked food and less restaurant, buffet, and fast food. °· Limit fried foods. °· Cook foods using methods other than frying. °· Limit canned vegetables. If you do use them, rinse them well to decrease the sodium. °· When eating at a restaurant, ask that your food be prepared with less salt, or no salt if possible. °WHAT FOODS CAN I EAT? °Seek help from a dietitian for individual calorie needs. °Grains °Whole grain or whole wheat bread. Brown rice. Whole grain or whole wheat pasta. Quinoa, bulgur, and whole grain cereals. Low-sodium cereals. Corn or whole wheat flour tortillas. Whole grain cornbread. Whole grain crackers. Low-sodium crackers. °Vegetables °Fresh or frozen vegetables  (raw, steamed, roasted, or grilled). Low-sodium or reduced-sodium tomato and vegetable juices. Low-sodium or reduced-sodium tomato sauce and paste. Low-sodium or reduced-sodium canned vegetables.  °Fruits °All fresh, canned (in natural juice), or frozen fruits. °Meat and Other Protein Products °Ground beef (85% or leaner), grass-fed beef, or beef trimmed of fat. Skinless chicken or turkey. Ground chicken or turkey. Pork trimmed of fat. All fish and seafood. Eggs. Dried beans, peas, or lentils. Unsalted nuts and seeds. Unsalted canned beans. °Dairy °Low-fat dairy products, such as skim or 1% milk, 2% or reduced-fat cheeses, low-fat ricotta or cottage cheese, or plain low-fat yogurt. Low-sodium or reduced-sodium cheeses. °Fats and Oils °Tub margarines without trans fats. Light or reduced-fat mayonnaise and salad dressings (reduced sodium). Avocado. Safflower, olive, or canola oils. Natural peanut or almond butter. °Other °Unsalted popcorn and pretzels. °The items listed above may not be a complete list of recommended foods or beverages. Contact your dietitian for more options. °WHAT FOODS ARE NOT RECOMMENDED? °Grains °White bread. White pasta. White rice. Refined cornbread. Bagels and croissants. Crackers that contain trans fat. °Vegetables °Creamed or fried vegetables. Vegetables in a cheese sauce. Regular canned vegetables. Regular canned tomato sauce and paste. Regular tomato and vegetable juices. °Fruits °Dried fruits. Canned fruit in light or heavy syrup. Fruit juice. °Meat and Other Protein Products °Fatty cuts of meat. Ribs, chicken wings, bacon, sausage, bologna, salami, chitterlings, fatback, hot dogs, bratwurst, and packaged luncheon meats. Salted nuts and seeds. Canned beans with salt. °Dairy °Whole or 2% milk, cream, half-and-half, and cream cheese. Whole-fat or sweetened yogurt. Full-fat   cheeses or blue cheese. Nondairy creamers and whipped toppings. Processed cheese, cheese spreads, or cheese  curds. °Condiments °Onion and garlic salt, seasoned salt, table salt, and sea salt. Canned and packaged gravies. Worcestershire sauce. Tartar sauce. Barbecue sauce. Teriyaki sauce. Soy sauce, including reduced sodium. Steak sauce. Fish sauce. Oyster sauce. Cocktail sauce. Horseradish. Ketchup and mustard. Meat flavorings and tenderizers. Bouillon cubes. Hot sauce. Tabasco sauce. Marinades. Taco seasonings. Relishes. °Fats and Oils °Butter, stick margarine, lard, shortening, ghee, and bacon fat. Coconut, palm kernel, or palm oils. Regular salad dressings. °Other °Pickles and olives. Salted popcorn and pretzels. °The items listed above may not be a complete list of foods and beverages to avoid. Contact your dietitian for more information. °WHERE CAN I FIND MORE INFORMATION? °National Heart, Lung, and [employer] Institute: www.nhlbi.nih.gov/health/health-topics/topics/dash/ °Document Released: 08/18/2011 Document Revised: 01/13/2014 Document Reviewed: 07/03/2013 °ExitCare® Patient Information ©2015 [employer]. This information is not intended to replace advice given to you by your health care provider. Make sure you discuss any questions you have with your health care provider. ° °

## 2014-12-10 NOTE — Progress Notes (Signed)
Patient complains of having pain to her left arm Complains of bilateral numbness to her hands making it difficult for  Her to pick things up Has pain to both of her great toes Patient states the gabapentin does nothing for her Blood pressure is elevated but patient has not taken her medication today

## 2014-12-10 NOTE — Progress Notes (Signed)
MRN: [id number] Name: Tiffany Brady  Sex: female Age: 54 y.o. DOB: [birth year]  Allergies: Review of patient's allergies indicates no known allergies.  Chief Complaint  Patient presents with  . Arm Pain    HPI: Patient is 54 y.o. female who history of hypertension, multiple joint pain, chronic pain syndrome, comes today complaining of numbness tingling in both hands, also on the last visit her as noted a uric acid runs high, she was started on allopurinol she has been taking it, as per patient she felt some pain in her both great toes, denies any recent fall or trauma, patient is currently taking Cymbalta for the pain, she is requesting prescription for Flexeril which helps her with the symptoms.  Today his blood pressure as high as per patient she has not taken her medication today.  Past Medical History  Diagnosis Date  . Hypertension     Past Surgical History  Procedure Laterality Date  . Foot surgery Right       Medication List       This list is accurate as of: 12/10/14 10:25 AM.  Always use your most recent med list.               allopurinol 100 MG tablet  Commonly known as:  ZYLOPRIM  Take 1 tablet (100 mg total) by mouth daily.     atenolol 50 MG tablet  Commonly known as:  TENORMIN  Take 1 tablet (50 mg total) by mouth daily.     baclofen 10 MG tablet  Commonly known as:  LIORESAL  Take 1 tablet (10 mg total) by mouth at bedtime.     butalbital-acetaminophen-caffeine 50-325-40 MG per tablet  Commonly known as:  FIORICET, ESGIC  Take 2 tablets by mouth every 6 (six) hours as needed for headache or migraine.     ciprofloxacin 500 MG tablet  Commonly known as:  CIPRO  Take 1 tablet (500 mg total) by mouth 2 (two) times daily.     cyclobenzaprine 10 MG tablet  Commonly known as:  FLEXERIL  Take 1 tablet (10 mg total) by mouth at bedtime.     DULoxetine 30 MG capsule  Commonly known as:  CYMBALTA  Take 1 capsule (30 mg total) by mouth daily.     fluticasone 50 MCG/ACT nasal spray  Commonly known as:  FLONASE  Place 2 sprays into both nostrils daily.     gabapentin 300 MG capsule  Commonly known as:  NEURONTIN  Take 1 capsule (300 mg total) by mouth at bedtime.     losartan-hydrochlorothiazide 100-25 MG per tablet  Commonly known as:  HYZAAR  Take 1 tablet by mouth daily.     methocarbamol 500 MG tablet  Commonly known as:  ROBAXIN  Take 1 tablet (500 mg total) by mouth at bedtime.     methylPREDNISolone 4 MG tablet  Commonly known as:  MEDROL DOSEPAK  follow package directions     ondansetron 4 MG tablet  Commonly known as:  ZOFRAN  Take 1 tablet (4 mg total) by mouth every 6 (six) hours.     oxyCODONE-acetaminophen 5-325 MG per tablet  Commonly known as:  PERCOCET/ROXICET  Take 1 tablet by mouth every 4 (four) hours as needed for severe pain. May take 2 tablets PO q 6 hours for severe pain - Do not take with Tylenol as this tablet already contains tylenol     sulfamethoxazole-trimethoprim 800-160 MG per tablet  Commonly known as:  BACTRIM DS  Take 1 tablet by mouth 2 (two) times daily.        Meds ordered this encounter  Medications  . methylPREDNISolone (MEDROL DOSEPAK) 4 MG tablet    Sig: follow package directions    Dispense:  21 tablet    Refill:  0  . cyclobenzaprine (FLEXERIL) 10 MG tablet    Sig: Take 1 tablet (10 mg total) by mouth at bedtime.    Dispense:  30 tablet    Refill:  1    Immunization History  Administered Date(s) Administered  . Influenza,inj,Quad PF,36+ Mos 10/30/2013, 09/29/2014  . Pneumococcal Polysaccharide-23 10/30/2013    History reviewed. No pertinent family history.  History  Substance Use Topics  . Smoking status: Current Every Day Smoker -- 0.50 packs/day    Types: Cigarettes  . Smokeless tobacco: Not on file  . Alcohol Use: No    Review of Systems   As noted in HPI  Filed Vitals:   12/10/14 1020  BP: 150/90  Pulse:   Temp:   Resp:     Physical  Exam  Physical Exam  Constitutional: No distress.  Eyes: EOM are normal. Pupils are equal, round, and reactive to light.  Cardiovascular: Normal rate and regular rhythm.   Pulmonary/Chest: Breath sounds normal. No respiratory distress. She has no wheezes. She has no rales.  Musculoskeletal:  Bilateral feet no swelling , on the first MTP joint  Minimal tenderness  Patient has some tenderness in bilateral hands at the MCP joint.    CBC    Component Value Date/Time   WBC 9.7 01/27/2014 1714   RBC 4.75 01/27/2014 1714   HGB 15.3* 04/03/2014 1052   HCT 45.0 04/03/2014 1052   PLT 265 01/27/2014 1714   MCV 91.6 01/27/2014 1714   LYMPHSABS 3.5 01/27/2014 1714   MONOABS 0.7 01/27/2014 1714   EOSABS 0.6 01/27/2014 1714   BASOSABS 0.1 01/27/2014 1714    CMP     Component Value Date/Time   NA 138 09/29/2014 1036   K 3.7 09/29/2014 1036   CL 104 09/29/2014 1036   CO2 27 09/29/2014 1036   GLUCOSE 102* 09/29/2014 1036   BUN 15 09/29/2014 1036   CREATININE 1.25* 09/29/2014 1036   CREATININE 1.30* 04/03/2014 1052   CALCIUM 8.8 09/29/2014 1036   PROT 6.9 09/29/2014 1036   ALBUMIN 3.4* 09/29/2014 1036   AST 16 09/29/2014 1036   ALT 19 09/29/2014 1036   ALKPHOS 71 09/29/2014 1036   BILITOT 0.8 09/29/2014 1036   GFRNONAA 49* 09/29/2014 1036   GFRNONAA 48* 06/05/2013 1841   GFRAA 56* 09/29/2014 1036   GFRAA 55* 06/05/2013 1841    Lab Results  Component Value Date/Time   CHOL 141 01/27/2014 05:14 PM    No components found for: HGA1C  Lab Results  Component Value Date/Time   AST 16 09/29/2014 10:36 AM    Assessment and Plan  Essential hypertension Today her blood pressure is high, patient has not taken the medication, continue with current meds, also advise patient for DASH diet.  Elevated uric acid in blood Advise patient to take allopurinol regularly, avoid alcohol and high-protein diet.  Chronic pain syndrome - Plan: Continue with Cymbalta, also prescribed  cyclobenzaprine (FLEXERIL) 10 MG tablet  Multiple joint pain - Plan: methylPREDNISolone (MEDROL DOSEPAK) 4 MG tablet  Smoking  Hand paresthesia, unspecified laterality - Plan: Ambulatory referral to Neurology  Return in about 3 months (around 03/12/2015) for hypertension.   This note has been created with Dragon speech  Land. Any transcriptional errors are unintentional.    Lorayne Marek, MD

## 2014-12-29 ENCOUNTER — Ambulatory Visit: Payer: Self-pay

## 2015-01-13 ENCOUNTER — Ambulatory Visit: Payer: Self-pay | Admitting: Neurology

## 2015-01-21 ENCOUNTER — Telehealth: Payer: Self-pay | Admitting: Neurology

## 2015-01-21 NOTE — Telephone Encounter (Signed)
Pt cancelled 01/13/15 NP appt w/ Dr. Posey Pronto. Dr Conley Canal office notified via EP[facility] referral note / Sherri S>

## 2015-02-03 ENCOUNTER — Telehealth: Payer: Self-pay

## 2015-02-03 ENCOUNTER — Telehealth: Payer: Self-pay | Admitting: Internal Medicine

## 2015-02-03 NOTE — Telephone Encounter (Signed)
Patient called requesting to speak to nurse regarding her pain. Please f/u with patient

## 2015-02-03 NOTE — Telephone Encounter (Signed)
Returned patient phone call Patient not available Left message on voice mail to return our call 

## 2015-02-03 NOTE — Telephone Encounter (Signed)
Pt called, returning nurse phone call Please f/u with patient

## 2015-02-04 ENCOUNTER — Other Ambulatory Visit: Payer: Self-pay

## 2015-02-04 ENCOUNTER — Other Ambulatory Visit: Payer: Self-pay | Admitting: Internal Medicine

## 2015-02-04 ENCOUNTER — Ambulatory Visit: Payer: Self-pay | Attending: Internal Medicine

## 2015-02-04 DIAGNOSIS — G894 Chronic pain syndrome: Secondary | ICD-10-CM

## 2015-02-04 MED ORDER — CYCLOBENZAPRINE HCL 10 MG PO TABS: 10.0000 mg | ORAL_TABLET | Freq: Every day | ORAL | Status: DC

## 2015-02-26 ENCOUNTER — Other Ambulatory Visit: Payer: Self-pay

## 2015-02-26 DIAGNOSIS — K0889 Other specified disorders of teeth and supporting structures: Secondary | ICD-10-CM

## 2015-03-17 ENCOUNTER — Ambulatory Visit: Payer: Self-pay | Attending: Internal Medicine | Admitting: Internal Medicine

## 2015-03-17 ENCOUNTER — Encounter: Payer: Self-pay | Admitting: Internal Medicine

## 2015-03-17 VITALS — BP 146/93 | HR 73 | Temp 98.0°F | Resp 16 | Wt 260.0 lb

## 2015-03-17 DIAGNOSIS — G894 Chronic pain syndrome: Secondary | ICD-10-CM | POA: Insufficient documentation

## 2015-03-17 DIAGNOSIS — R1012 Left upper quadrant pain: Secondary | ICD-10-CM | POA: Insufficient documentation

## 2015-03-17 DIAGNOSIS — M62830 Muscle spasm of back: Secondary | ICD-10-CM | POA: Insufficient documentation

## 2015-03-17 DIAGNOSIS — F17210 Nicotine dependence, cigarettes, uncomplicated: Secondary | ICD-10-CM | POA: Insufficient documentation

## 2015-03-17 DIAGNOSIS — R202 Paresthesia of skin: Secondary | ICD-10-CM | POA: Insufficient documentation

## 2015-03-17 DIAGNOSIS — I10 Essential (primary) hypertension: Secondary | ICD-10-CM | POA: Insufficient documentation

## 2015-03-17 DIAGNOSIS — Z79899 Other long term (current) drug therapy: Secondary | ICD-10-CM | POA: Insufficient documentation

## 2015-03-17 DIAGNOSIS — Z113 Encounter for screening for infections with a predominantly sexual mode of transmission: Secondary | ICD-10-CM | POA: Insufficient documentation

## 2015-03-17 LAB — POCT URINALYSIS DIPSTICK
BILIRUBIN UA: NEGATIVE
GLUCOSE UA: NEGATIVE
KETONES UA: NEGATIVE
Leukocytes, UA: NEGATIVE
Nitrite, UA: NEGATIVE
Protein, UA: NEGATIVE
RBC UA: NEGATIVE
Spec Grav, UA: 1.025
UROBILINOGEN UA: 0.2
pH, UA: 5.5

## 2015-03-17 LAB — HIV ANTIBODY (ROUTINE TESTING W REFLEX): HIV 1&2 Ab, 4th Generation: NONREACTIVE

## 2015-03-17 MED ORDER — DULOXETINE HCL 30 MG PO CPEP: 30.0000 mg | ORAL_CAPSULE | Freq: Every day | ORAL | Status: DC

## 2015-03-17 MED ORDER — ATENOLOL 50 MG PO TABS: 50.0000 mg | ORAL_TABLET | Freq: Every day | ORAL | Status: DC

## 2015-03-17 MED ORDER — LOSARTAN POTASSIUM-HCTZ 100-25 MG PO TABS: 1.0000 | ORAL_TABLET | Freq: Every day | ORAL | Status: DC

## 2015-03-17 MED ORDER — GABAPENTIN 300 MG PO CAPS
300.0000 mg | ORAL_CAPSULE | Freq: Every day | ORAL | Status: DC
Start: 2015-03-17 — End: 2015-05-07

## 2015-03-17 MED ORDER — CYCLOBENZAPRINE HCL 10 MG PO TABS: 10.0000 mg | ORAL_TABLET | Freq: Every day | ORAL | Status: DC
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
## 2015-03-17 NOTE — Patient Instructions (Signed)
DASH Eating Plan °DASH stands for "Dietary Approaches to Stop Hypertension." The DASH eating plan is a healthy eating plan that has been shown to reduce high blood pressure (hypertension). Additional health benefits may include reducing the risk of type 2 diabetes mellitus, heart disease, and stroke. The DASH eating plan may also help with weight loss. °WHAT DO I NEED TO KNOW ABOUT THE DASH EATING PLAN? °For the DASH eating plan, you will follow these general guidelines: °· Choose foods with a percent daily value for sodium of less than 5% (as listed on the food label). °· Use salt-free seasonings or herbs instead of table salt or sea salt. °· Check with your health care provider or pharmacist before using salt substitutes. °· Eat lower-sodium products, often labeled as "lower sodium" or "no salt added." °· Eat fresh foods. °· Eat more vegetables, fruits, and low-fat dairy products. °· Choose whole grains. Look for the word "whole" as the first word in the ingredient list. °· Choose fish and skinless chicken or turkey more often than red meat. Limit fish, poultry, and meat to 6 oz (170 g) each day. °· Limit sweets, desserts, sugars, and sugary drinks. °· Choose heart-healthy fats. °· Limit cheese to 1 oz (28 g) per day. °· Eat more home-cooked food and less restaurant, buffet, and fast food. °· Limit fried foods. °· Cook foods using methods other than frying. °· Limit canned vegetables. If you do use them, rinse them well to decrease the sodium. °· When eating at a restaurant, ask that your food be prepared with less salt, or no salt if possible. °WHAT FOODS CAN I EAT? °Seek help from a dietitian for individual calorie needs. °Grains °Whole grain or whole wheat bread. Brown rice. Whole grain or whole wheat pasta. Quinoa, bulgur, and whole grain cereals. Low-sodium cereals. Corn or whole wheat flour tortillas. Whole grain cornbread. Whole grain crackers. Low-sodium crackers. °Vegetables °Fresh or frozen vegetables  (raw, steamed, roasted, or grilled). Low-sodium or reduced-sodium tomato and vegetable juices. Low-sodium or reduced-sodium tomato sauce and paste. Low-sodium or reduced-sodium canned vegetables.  °Fruits °All fresh, canned (in natural juice), or frozen fruits. °Meat and Other Protein Products °Ground beef (85% or leaner), grass-fed beef, or beef trimmed of fat. Skinless chicken or turkey. Ground chicken or turkey. Pork trimmed of fat. All fish and seafood. Eggs. Dried beans, peas, or lentils. Unsalted nuts and seeds. Unsalted canned beans. °Dairy °Low-fat dairy products, such as skim or 1% milk, 2% or reduced-fat cheeses, low-fat ricotta or cottage cheese, or plain low-fat yogurt. Low-sodium or reduced-sodium cheeses. °Fats and Oils °Tub margarines without trans fats. Light or reduced-fat mayonnaise and salad dressings (reduced sodium). Avocado. Safflower, olive, or canola oils. Natural peanut or almond butter. °Other °Unsalted popcorn and pretzels. °The items listed above may not be a complete list of recommended foods or beverages. Contact your dietitian for more options. °WHAT FOODS ARE NOT RECOMMENDED? °Grains °White bread. White pasta. White rice. Refined cornbread. Bagels and croissants. Crackers that contain trans fat. °Vegetables °Creamed or fried vegetables. Vegetables in a cheese sauce. Regular canned vegetables. Regular canned tomato sauce and paste. Regular tomato and vegetable juices. °Fruits °Dried fruits. Canned fruit in light or heavy syrup. Fruit juice. °Meat and Other Protein Products °Fatty cuts of meat. Ribs, chicken wings, bacon, sausage, bologna, salami, chitterlings, fatback, hot dogs, bratwurst, and packaged luncheon meats. Salted nuts and seeds. Canned beans with salt. °Dairy °Whole or 2% milk, cream, half-and-half, and cream cheese. Whole-fat or sweetened yogurt. Full-fat   cheeses or blue cheese. Nondairy creamers and whipped toppings. Processed cheese, cheese spreads, or cheese  curds. °Condiments °Onion and garlic salt, seasoned salt, table salt, and sea salt. Canned and packaged gravies. Worcestershire sauce. Tartar sauce. Barbecue sauce. Teriyaki sauce. Soy sauce, including reduced sodium. Steak sauce. Fish sauce. Oyster sauce. Cocktail sauce. Horseradish. Ketchup and mustard. Meat flavorings and tenderizers. Bouillon cubes. Hot sauce. Tabasco sauce. Marinades. Taco seasonings. Relishes. °Fats and Oils °Butter, stick margarine, lard, shortening, ghee, and bacon fat. Coconut, palm kernel, or palm oils. Regular salad dressings. °Other °Pickles and olives. Salted popcorn and pretzels. °The items listed above may not be a complete list of foods and beverages to avoid. Contact your dietitian for more information. °WHERE CAN I FIND MORE INFORMATION? °National Heart, Lung, and [employer] Institute: www.nhlbi.nih.gov/health/health-topics/topics/dash/ °Document Released: 08/18/2011 Document Revised: 01/13/2014 Document Reviewed: 07/03/2013 °ExitCare® Patient Information ©2015 [employer]. This information is not intended to replace advice given to you by your health care provider. Make sure you discuss any questions you have with your health care provider. ° °

## 2015-03-17 NOTE — Progress Notes (Signed)
MRN: [id number] Name: Tiffany Brady  Sex: female Age: 54 y.o. DOB: 09-11-1961  Allergies: Review of patient's allergies indicates no known allergies.  Chief Complaint  Patient presents with  . Follow-up    HPI: Patient is 54 y.o. female who has is she of chronic pain syndrome, hypertension, numbness tingling in both hands, as per patient she has not taken her blood pressure medication today needs refill on all her medications , she is also complaining of abdominal pain left upper quadrant which is worse with certain movements, denies any nausea vomiting change in bowel habits denies any urinary symptoms denies any fever chills, last time patient was given referral to see a neurology and has not been able to make an appointment because of insurance issues, currently patient has [employer] card and is requesting referral.patient also wants to be tested for STDs.  Past Medical History  Diagnosis Date  . Hypertension     Past Surgical History  Procedure Laterality Date  . Foot surgery Right       Medication List       This list is accurate as of: 03/17/15 10:45 AM.  Always use your most recent med list.               allopurinol 100 MG tablet  Commonly known as:  ZYLOPRIM  Take 1 tablet (100 mg total) by mouth daily.     atenolol 50 MG tablet  Commonly known as:  TENORMIN  Take 1 tablet (50 mg total) by mouth daily.     baclofen 10 MG tablet  Commonly known as:  LIORESAL  Take 1 tablet (10 mg total) by mouth at bedtime.     butalbital-acetaminophen-caffeine 50-325-40 MG per tablet  Commonly known as:  FIORICET, ESGIC  Take 2 tablets by mouth every 6 (six) hours as needed for headache or migraine.     ciprofloxacin 500 MG tablet  Commonly known as:  CIPRO  Take 1 tablet (500 mg total) by mouth 2 (two) times daily.     cyclobenzaprine 10 MG tablet  Commonly known as:  FLEXERIL  Take 1 tablet (10 mg total) by mouth at bedtime.     DULoxetine 30 MG capsule    Commonly known as:  CYMBALTA  Take 1 capsule (30 mg total) by mouth daily.     fluticasone 50 MCG/ACT nasal spray  Commonly known as:  FLONASE  Place 2 sprays into both nostrils daily.     gabapentin 300 MG capsule  Commonly known as:  NEURONTIN  Take 1 capsule (300 mg total) by mouth at bedtime.     losartan-hydrochlorothiazide 100-25 MG per tablet  Commonly known as:  HYZAAR  Take 1 tablet by mouth daily.     methocarbamol 500 MG tablet  Commonly known as:  ROBAXIN  Take 1 tablet (500 mg total) by mouth at bedtime.     methylPREDNISolone 4 MG tablet  Commonly known as:  MEDROL DOSEPAK  follow package directions     ondansetron 4 MG tablet  Commonly known as:  ZOFRAN  Take 1 tablet (4 mg total) by mouth every 6 (six) hours.     oxyCODONE-acetaminophen 5-325 MG per tablet  Commonly known as:  PERCOCET/ROXICET  Take 1 tablet by mouth every 4 (four) hours as needed for severe pain. May take 2 tablets PO q 6 hours for severe pain - Do not take with Tylenol as this tablet already contains tylenol     sulfamethoxazole-trimethoprim 800-160 MG per  tablet  Commonly known as:  BACTRIM DS  Take 1 tablet by mouth 2 (two) times daily.        Meds ordered this encounter  Medications  . gabapentin (NEURONTIN) 300 MG capsule    Sig: Take 1 capsule (300 mg total) by mouth at bedtime.    Dispense:  90 capsule    Refill:  3  . atenolol (TENORMIN) 50 MG tablet    Sig: Take 1 tablet (50 mg total) by mouth daily.    Dispense:  30 tablet    Refill:  3  . cyclobenzaprine (FLEXERIL) 10 MG tablet    Sig: Take 1 tablet (10 mg total) by mouth at bedtime.    Dispense:  30 tablet    Refill:  3  . DULoxetine (CYMBALTA) 30 MG capsule    Sig: Take 1 capsule (30 mg total) by mouth daily.    Dispense:  120 capsule    Refill:  3  . losartan-hydrochlorothiazide (HYZAAR) 100-25 MG per tablet    Sig: Take 1 tablet by mouth daily.    Dispense:  30 tablet    Refill:  3    Immunization History   Administered Date(s) Administered  . Influenza,inj,Quad PF,36+ Mos 10/30/2013, 09/29/2014  . Pneumococcal Polysaccharide-23 10/30/2013    History reviewed. No pertinent family history.  History  Substance Use Topics  . Smoking status: Current Every Day Smoker -- 0.50 packs/day    Types: Cigarettes  . Smokeless tobacco: Not on file  . Alcohol Use: No    Review of Systems   As noted in HPI  Filed Vitals:   03/17/15 0924  BP: 146/93  Pulse: 73  Temp: 98 F (36.7 C)  Resp: 16    Physical Exam  Physical Exam  Constitutional: No distress.  Eyes: EOM are normal. Pupils are equal, round, and reactive to light.  Cardiovascular: Normal rate and regular rhythm.   Pulmonary/Chest: Breath sounds normal. No respiratory distress. She has no wheezes. She has no rales.  Abdominal: Soft. There is no tenderness. There is no rebound.  Musculoskeletal:  Left lower lumbar paraspinal tenderness    CBC    Component Value Date/Time   WBC 9.7 01/27/2014 1714   RBC 4.75 01/27/2014 1714   HGB 15.3* 04/03/2014 1052   HCT 45.0 04/03/2014 1052   PLT 265 01/27/2014 1714   MCV 91.6 01/27/2014 1714   LYMPHSABS 3.5 01/27/2014 1714   MONOABS 0.7 01/27/2014 1714   EOSABS 0.6 01/27/2014 1714   BASOSABS 0.1 01/27/2014 1714    CMP     Component Value Date/Time   NA 138 09/29/2014 1036   K 3.7 09/29/2014 1036   CL 104 09/29/2014 1036   CO2 27 09/29/2014 1036   GLUCOSE 102* 09/29/2014 1036   BUN 15 09/29/2014 1036   CREATININE 1.25* 09/29/2014 1036   CREATININE 1.30* 04/03/2014 1052   CALCIUM 8.8 09/29/2014 1036   PROT 6.9 09/29/2014 1036   ALBUMIN 3.4* 09/29/2014 1036   AST 16 09/29/2014 1036   ALT 19 09/29/2014 1036   ALKPHOS 71 09/29/2014 1036   BILITOT 0.8 09/29/2014 1036   GFRNONAA 49* 09/29/2014 1036   GFRNONAA 48* 06/05/2013 1841   GFRAA 56* 09/29/2014 1036   GFRAA 55* 06/05/2013 1841    Lab Results  Component Value Date/Time   CHOL 141 01/27/2014 05:14 PM    No  results found for: HGBA1C  Lab Results  Component Value Date/Time   AST 16 09/29/2014 10:36 AM  Assessment and Plan  Left upper quadrant pain/Back muscle spasm - Plan:  Results for orders placed or performed in visit on 03/17/15  Urinalysis Dipstick  Result Value Ref Range   Color, UA yellow    Clarity, UA clear    Glucose, UA neg    Bilirubin, UA neg    Ketones, UA neg    Spec Grav, UA 1.025    Blood, UA neg    pH, UA 5.5    Protein, UA neg    Urobilinogen, UA 0.2    Nitrite, UA neg    Leukocytes, UA Negative Negative   Urinalysis Dipstick is negative for infection, likely muscular, patient is given refill on Flexeril  Screen for STD (sexually transmitted disease) - Plan: GC/chlamydia probe amp, urine, HIV antibody (with reflex)  Chronic pain syndrome - Plan: gabapentin (NEURONTIN) 300 MG capsule, cyclobenzaprine (FLEXERIL) 10 MG tablet, DULoxetine (CYMBALTA) 30 MG capsule  Hand paresthesia, unspecified laterality - Plan: Ambulatory referral to Neurology  Essential hypertension - Plan: atenolol (TENORMIN) 50 MG tablet, losartan-hydrochlorothiazide (HYZAAR) 100-25 MG per tablet   Return in about 3 months (around 06/17/2015), or if symptoms worsen or fail to improve.   This note has been created with Surveyor, quantity. Any transcriptional errors are unintentional.    Lorayne Marek, MD

## 2015-03-18 ENCOUNTER — Telehealth: Payer: Self-pay | Admitting: General Practice

## 2015-03-18 LAB — GC/CHLAMYDIA PROBE AMP, URINE
Chlamydia, Swab/Urine, PCR: NEGATIVE
GC Probe Amp, Urine: NEGATIVE

## 2015-03-18 NOTE — Telephone Encounter (Signed)
Patient calling to inform clinic of her upcoming appointment with neurologist. Patient states neurologist office called her to confirm her appointment, and they advised her that [employer] card/[employer] discount does not cover neurology. Patient would like to know of any alternatives. Please follow up with patient

## 2015-03-18 NOTE — Telephone Encounter (Signed)
Good Morning  I spoke to patient and she is going to call back [facility] and mention that she has [facility] because they don't participate with the orange card only the financial assistant program . Thank you

## 2015-03-19 ENCOUNTER — Telehealth: Payer: Self-pay

## 2015-03-19 ENCOUNTER — Telehealth: Payer: Self-pay | Admitting: Internal Medicine

## 2015-03-19 NOTE — Telephone Encounter (Signed)
-----   Message from Lorayne Marek, MD sent at 03/18/2015 12:58 PM EDT ----- Call and let the patient know that her lab is normal negative for GC/Chlam and HIV

## 2015-03-19 NOTE — Telephone Encounter (Signed)
Pt is calling to speak with Ms. Denise. Please f/u.

## 2015-03-19 NOTE — Telephone Encounter (Signed)
Patient is aware of her lab results 

## 2015-03-23 NOTE — Telephone Encounter (Signed)
Pt has contacted

## 2015-03-23 NOTE — Telephone Encounter (Signed)
Pt was contacted on 03/19/2015

## 2015-05-07 ENCOUNTER — Other Ambulatory Visit: Payer: Self-pay | Admitting: *Deleted

## 2015-05-07 ENCOUNTER — Other Ambulatory Visit (INDEPENDENT_AMBULATORY_CARE_PROVIDER_SITE_OTHER): Payer: Self-pay

## 2015-05-07 ENCOUNTER — Ambulatory Visit (INDEPENDENT_AMBULATORY_CARE_PROVIDER_SITE_OTHER): Payer: Self-pay | Admitting: Neurology

## 2015-05-07 ENCOUNTER — Encounter: Payer: Self-pay | Admitting: Neurology

## 2015-05-07 VITALS — BP 134/88 | HR 73 | Ht 64.0 in | Wt 264.6 lb

## 2015-05-07 DIAGNOSIS — M797 Fibromyalgia: Secondary | ICD-10-CM

## 2015-05-07 DIAGNOSIS — G894 Chronic pain syndrome: Secondary | ICD-10-CM

## 2015-05-07 DIAGNOSIS — R202 Paresthesia of skin: Secondary | ICD-10-CM

## 2015-05-07 DIAGNOSIS — F17200 Nicotine dependence, unspecified, uncomplicated: Secondary | ICD-10-CM

## 2015-05-07 DIAGNOSIS — Z720 Tobacco use: Secondary | ICD-10-CM

## 2015-05-07 LAB — CK: Total CK: 185 U/L — ABNORMAL HIGH (ref 7–177)

## 2015-05-07 LAB — VITAMIN D 25 HYDROXY (VIT D DEFICIENCY, FRACTURES): VITD: 12.33 ng/mL — AB (ref 30.00–100.00)

## 2015-05-07 LAB — VITAMIN B12: VITAMIN B 12: 336 pg/mL (ref 211–911)

## 2015-05-07 MED ORDER — VITAMIN D (ERGOCALCIFEROL) 1.25 MG (50000 UNIT) PO CAPS
50000.0000 [IU] | ORAL_CAPSULE | ORAL | Status: DC
Start: 2015-05-07 — End: 2015-08-03

## 2015-05-07 MED ORDER — GABAPENTIN 300 MG PO CAPS: 300.0000 mg | ORAL_CAPSULE | Freq: Two times a day (BID) | ORAL | Status: DC

## 2015-05-07 NOTE — Patient Instructions (Signed)
1.  Check blood work 2.  EMG of the upper extremities 3.  Increase gabapentin to 300mg  twice daily 4.  Strongly encouraged to stop smoking 5.  Return to clinic in 2 months

## 2015-05-07 NOTE — Progress Notes (Signed)
[facility] Clinic Note - Initial Visit   Date: 05/07/2015  Tiffany Brady MRN: [id number] DOB: [birth year]   Dear Dr. Annitta Needs:  Thank you for your kind referral of Tiffany Brady for consultation of bilateral hand paresthesias. Although her history is well known to you, please allow [name] to reiterate it for the purpose of our medical record. The patient was accompanied to the clinic by self.    History of Present Illness: Tiffany Brady is a 54 y.o. right-handed African American female with chronic pain syndrome, fibromyaglia, hypertension, and tobacco use presenting for evaluation of bilateral hand paresthesias.    Starting in July 2016, she began experiencing numbness/tingling over the tips of her fingers on both hands which has steadily involved the whole hand. Denies any exacerbating or alleviating factors. She often tries to shake her hands because they always feel asleep.  She also has numbness over the right leg above the knees, but this is intermittent.  She endorses pain related weakness and has difficulty with walking and doing daily activities such as bathing and cooking because of the pain.    She also has a lot of whole body pain described as soreness which is tender to palpation. She feels as if someone cut her and bleeding on the inside or as if someone is breaking her collar bone.   She was recently started on Cymbalta helps alleviate the pain where she can function, but it is still always there.  Her ANA, RF, and TSH is normal.       Out-side paper records, electronic medical record, and images have been reviewed where available and summarized as:  Lab Results  Component Value Date   TSH 2.018 01/27/2014  Labs 09/29/2014:  RF 12, ANA neg, ESR 23,  XR lumbar spine 04/03/2014:  Disc degeneration and spondylosis L4-5 and L5-S1.  CT head wo contrast 06/05/2013:  Mild periventricular small vessel disease. No intracranial mass,hemorrhage, or acute  infarct. Mild right posterior ethmoid sinus disease.  Past Medical History  Diagnosis Date  . Hypertension     Past Surgical History  Procedure Laterality Date  . Foot surgery Right      Medications:  Outpatient Encounter Prescriptions as of 05/07/2015  Medication Sig  . allopurinol (ZYLOPRIM) 100 MG tablet Take 1 tablet (100 mg total) by mouth daily.  . cyclobenzaprine (FLEXERIL) 10 MG tablet Take 1 tablet (10 mg total) by mouth at bedtime.  . DULoxetine (CYMBALTA) 30 MG capsule Take 1 capsule (30 mg total) by mouth daily.  Marland Kitchen gabapentin (NEURONTIN) 300 MG capsule Take 1 capsule (300 mg total) by mouth at bedtime.  Marland Kitchen losartan-hydrochlorothiazide (HYZAAR) 100-25 MG per tablet Take 1 tablet by mouth daily.  . [DISCONTINUED] butalbital-acetaminophen-caffeine (FIORICET, ESGIC) 50-325-40 MG per tablet Take 2 tablets by mouth every 6 (six) hours as needed for headache or migraine.  . [DISCONTINUED] losartan-hydrochlorothiazide (HYZAAR) 100-25 MG per tablet Take 1 tablet by mouth daily.  . [DISCONTINUED] atenolol (TENORMIN) 50 MG tablet Take 1 tablet (50 mg total) by mouth daily.  . [DISCONTINUED] baclofen (LIORESAL) 10 MG tablet Take 1 tablet (10 mg total) by mouth at bedtime.  . [DISCONTINUED] ciprofloxacin (CIPRO) 500 MG tablet Take 1 tablet (500 mg total) by mouth 2 (two) times daily.  . [DISCONTINUED] fluticasone (FLONASE) 50 MCG/ACT nasal spray Place 2 sprays into both nostrils daily.  . [DISCONTINUED] methocarbamol (ROBAXIN) 500 MG tablet Take 1 tablet (500 mg total) by mouth at bedtime.  . [DISCONTINUED] methylPREDNISolone (MEDROL DOSEPAK) 4  MG tablet follow package directions  . [DISCONTINUED] ondansetron (ZOFRAN) 4 MG tablet Take 1 tablet (4 mg total) by mouth every 6 (six) hours.  . [DISCONTINUED] oxyCODONE-acetaminophen (PERCOCET/ROXICET) 5-325 MG per tablet Take 1 tablet by mouth every 4 (four) hours as needed for severe pain. May take 2 tablets PO q 6 hours for severe pain - Do  not take with Tylenol as this tablet already contains tylenol  . [DISCONTINUED] sulfamethoxazole-trimethoprim (BACTRIM DS) 800-160 MG per tablet Take 1 tablet by mouth 2 (two) times daily. (Patient not taking: Reported on 08/04/2014)   No facility-administered encounter medications on file as of 05/07/2015.     Allergies: No Known Allergies  Family History: Family History  Problem Relation Age of Onset  . Cancer Mother   . Hypertension Mother   . HIV/AIDS Brother     Social History: Social History  Substance Use Topics  . Smoking status: Current Every Day Smoker -- 0.50 packs/day    Types: Cigarettes  . Smokeless tobacco: Never Used  . Alcohol Use: 0.0 oz/week    0 Standard drinks or equivalent per week   Social History   Social History Narrative   Lives with niece and her 3 children in a 2 story home.  Only goes upstairs once a day.  Has 1 child.  Does not work.  Trying to get disability.  Used to work as a Art gallery manager.  Education: 10th grade.    Review of Systems:  CONSTITUTIONAL: No fevers, chills, night sweats, or weight loss.   EYES: No visual changes or eye pain ENT: No hearing changes.  No history of nose bleeds.   RESPIRATORY: No cough, wheezing and shortness of breath.   CARDIOVASCULAR: Negative for chest pain, and palpitations.   GI: Negative for abdominal discomfort, blood in stools or black stools.  No recent change in bowel habits.   GU:  No history of incontinence.   MUSCLOSKELETAL: No history of joint pain or swelling.  +myalgias.   SKIN: Negative for lesions, rash, and itching.   HEMATOLOGY/ONCOLOGY: Negative for prolonged bleeding, bruising easily, and swollen nodes.  No history of cancer.   ENDOCRINE: Negative for cold or heat intolerance, polydipsia or goiter.   PSYCH:  No depression or anxiety symptoms.   NEURO: As Above.   Vital Signs:  BP 134/88 mmHg  Pulse 73  Ht $R'5\' 4"'hn$  (1.626 m)  Wt 264 lb 9 oz (120.005 kg)  BMI 45.39 kg/m2  SpO2 96%  LMP  04/21/2014   General Medical Exam:   General:  Mildly distressed due to pain.   Eyes/ENT: see cranial nerve examination.   Neck: No masses appreciated.  Full range of motion without tenderness.  No carotid bruits. Respiratory:  Clear to auscultation, good air entry bilaterally.   Cardiac:  Regular rate and rhythm, no murmur.   Extremities:  No deformities, edema, or skin discoloration.  Skin:  No rashes or lesions.  Neurological Exam: MENTAL STATUS including orientation to time, place, person, recent and remote memory, attention span and concentration, language, and fund of knowledge is normal.  Speech is not dysarthric.  CRANIAL NERVES: II:  No visual field defects.  Unremarkable fundi.   III-IV-VI: Pupils equal round and reactive to light.  Normal conjugate, extra-ocular eye movements in all directions of gaze.  No nystagmus.  No ptosis.   V:  Normal facial sensation.   VII:  Normal facial symmetry and movements.  No pathologic facial reflexes.  VIII:  Normal hearing and vestibular function.  IX-X:  Normal palatal movement.   XI:  Normal shoulder shrug and head rotation.   XII:  Normal tongue strength and range of motion, no deviation or fasciculation.  MOTOR:  There is marked muscle tenderness with motor strength testing, but strength was 5/5 throughout.  She had tenderness with 18/18 fibromyalgia tender points.  No atrophy, fasciculations or abnormal movements.  No pronator drift.  Tone is normal.    MSRs:  Right                                                                 Left brachioradialis 2+  brachioradialis 2+  biceps 2+  biceps 2+  triceps 2+  triceps 2+  patellar 2+  patellar 2+  ankle jerk 2+  ankle jerk 2+  Hoffman no  Hoffman no  plantar response down  plantar response down   SENSORY:  Diminished pin prick over the entire upper extremities bilaterally which does not confirm to a nerve distribution.  There is no sensory level.  Sensation preserved in the legs to  pin prick, temperature, light touch.  Romberg's sign absent.   COORDINATION/GAIT: Normal finger-to- nose-finger. Slowed and inconsistent finger tapping bilaterally.  Unable to rise from a chair without using arms.  Gait wide-based due to body habitus, but appears stable.  She is unable to perform stressed or tandem gait.   IMPRESSION: 1.  Generalized paresthesias, worse in the arms and does not follow a cutaneous nerve or dermatomal distribution 2.  Whole body pain and myalgias, likely due to fibromyalgia but will also evaluate for other causes and screen for myopathy, keeping in mind that African Americans can have higher CKs at baseline. With preserved muscle bulk and strength, low suspicion for myopathy. 3.  Tobacco use  PLAN/RECOMMENDATIONS:  1.  Check CK, aldolase, vitamin B12, vitamin B1, vitamin D 2.  EMG of the upper extremities 3.  Increase gabapentin to 344m twice daily 4.  Smoking cessation instruction/counseling given:  counseled patient on the dangers of tobacco use, advised patient to stop smoking, and reviewed strategies to maximize success  Return to clinic in 2 months.   The duration of this appointment visit was 40 minutes of face-to-face time with the patient.  Greater than 50% of this time was spent in counseling, explanation of diagnosis, planning of further management, and coordination of care.   Thank you for allowing me to participate in patient's care.  If I can answer any additional questions, I would be pleased to do so.    Sincerely,    Idalia Allbritton K. PPosey Pronto DO

## 2015-05-10 LAB — VITAMIN B1

## 2015-05-10 LAB — ALDOLASE: Aldolase: 6.2 U/L (ref <=8.1)

## 2015-05-19 ENCOUNTER — Encounter: Payer: Self-pay | Admitting: Neurology

## 2015-05-20 ENCOUNTER — Other Ambulatory Visit: Payer: Self-pay | Admitting: *Deleted

## 2015-05-20 DIAGNOSIS — R200 Anesthesia of skin: Secondary | ICD-10-CM

## 2015-05-26 ENCOUNTER — Ambulatory Visit (INDEPENDENT_AMBULATORY_CARE_PROVIDER_SITE_OTHER): Payer: Self-pay | Admitting: Neurology

## 2015-05-26 DIAGNOSIS — R200 Anesthesia of skin: Secondary | ICD-10-CM

## 2015-05-26 DIAGNOSIS — G5603 Carpal tunnel syndrome, bilateral upper limbs: Secondary | ICD-10-CM

## 2015-05-26 NOTE — Procedures (Addendum)
[facility]  [address], [place]  [place], [place] [address] Tel: [phone] Fax:  [phone] Test Date:  05/26/2015  Patient: Tiffany Brady DOB: [birth year] Physician: Narda Amber, DO  Sex: Female Height: 5\' 4"  Ref Phys: Narda Amber, DO  ID#: [id number] Temp: 33.2C Technician: Jerilynn Mages. Dean   Patient Complaints: This is a 54 year old female presenting for evaluation of bilateral hand paresthesias.  NCV & EMG Findings: Extensive electrodiagnostic testing of the right upper extremity and additional studies of the left shows:  1. The right median sensory nerve showed prolonged distal peak latency (3.8 ms) and reduced amplitude (13.4 V).  Left median sensory nerve showed prolonged distal peak latency (3.7 ms).  Bilateral radial and ulnar sensory responses are within normal limits. 2. Bilateral ulnar and median motor responses are within normal limits. 3. There is no evidence of active or chronic motor axon loss changes affecting any of the tested muscles. There is a generalized pattern of incomplete motor unit recruitment, which is most likely due to  poor effort and/or pain, less likely central disorder of motor unit control.  Impression: 1. Bilateral median neuropathy at or distal to the wrist, consistent with clinical diagnosis of carpal tunnel syndrome. Overall, these findings are mild in degree electrically and worse on the right. 2. There is no evidence of a cervical radiculopathy or diffuse myopathy affecting upper extremities.   ___________________________ Narda Amber, DO    Nerve Conduction Studies Anti Sensory Summary Table   Site NR Peak (ms) Norm Peak (ms) P-T Amp (V) Norm P-T Amp  Left Median Anti Sensory (2nd Digit)  Wrist    3.7 <3.6 16.2 >15  Right Median Anti Sensory (2nd Digit)  Wrist    3.8 <3.6 13.4 >15  Left Radial Anti Sensory (Base 1st Digit)  Wrist    2.0 <2.7 31.0 >14  Right Radial Anti Sensory (Base 1st Digit)  Wrist    2.0 <2.7 30.1  >14  Left Ulnar Anti Sensory (5th Digit)  Wrist    3.0 <3.1 18.6 >10  Right Ulnar Anti Sensory (5th Digit)  Wrist    2.9 <3.1 13.2 >10   Motor Summary Table   Site NR Onset (ms) Norm Onset (ms) O-P Amp (mV) Norm O-P Amp Site1 Site2 Delta-0 (ms) Dist (cm) Vel (m/s) Norm Vel (m/s)  Left Median Motor (Abd Poll Brev)  Wrist    3.4 <4.0 7.5 >6 Elbow Wrist 4.0 23.0 58 >50  Elbow    7.4  7.1         Right Median Motor (Abd Poll Brev)  Wrist    3.8 <4.0 6.5 >6 Elbow Wrist 4.3 23.5 55 >50  Elbow    8.1  5.9         Left Ulnar Motor (Abd Dig Minimi)  Wrist    2.6 <3.1 12.1 >7 B Elbow Wrist 3.4 20.0 59 >50  B Elbow    6.0  11.9  A Elbow B Elbow 1.6 10.0 63 >50  A Elbow    7.6  11.4         Right Ulnar Motor (Abd Dig Minimi)  Wrist    2.3 <3.1 10.7 >7 B Elbow Wrist 3.6 21.0 58 >50  B Elbow    5.9  10.7  A Elbow B Elbow 1.6 10.0 63 >50  A Elbow    7.5  10.6          EMG   Side Muscle Ins Act Fibs Psw Fasc Number  Recrt Dur Dur. Amp Amp. Poly Poly. Comment  Left 1stDorInt Nml Nml Nml Nml 1- Mod-V Nml Nml Nml Nml Nml Nml N/A  Left Abd Poll Brev Nml Nml Nml Nml 1- Mod-V Nml Nml Nml Nml Nml Nml N/A  Left Ext Indicis Nml Nml Nml Nml 1- Mod-V Nml Nml Nml Nml Nml Nml N/A  Left PronatorTeres Nml Nml Nml Nml 1- Mod-V Nml Nml Nml Nml Nml Nml N/A  Left Biceps Nml Nml Nml Nml 1- Mod-V Nml Nml Nml Nml Nml Nml N/A  Left Triceps Nml Nml Nml Nml 1- Mod-V Nml Nml Nml Nml Nml Nml N/A  Left Deltoid Nml Nml Nml Nml 1- Mod-V Nml Nml Nml Nml Nml Nml N/A  Right 1stDorInt Nml Nml Nml Nml 1- Mod-V Nml Nml Nml Nml Nml Nml N/A  Right Ext Indicis Nml Nml Nml Nml 1- Mod-V Nml Nml Nml Nml Nml Nml N/A  Right PronatorTeres Nml Nml Nml Nml 1- Mod-V Nml Nml Nml Nml Nml Nml N/A  Right Biceps Nml Nml Nml Nml 1- Mod-V Nml Nml Nml Nml Nml Nml N/A  Right Triceps Nml Nml Nml Nml 1- Mod-V Nml Nml Nml Nml Nml Nml N/A  Right Deltoid Nml Nml Nml Nml 1- Mod-V Nml Nml Nml Nml Nml Nml N/A      Waveforms:

## 2015-06-02 ENCOUNTER — Encounter: Payer: Self-pay | Admitting: Neurology

## 2015-06-23 ENCOUNTER — Ambulatory Visit: Payer: Self-pay

## 2015-06-26 ENCOUNTER — Ambulatory Visit: Payer: Self-pay | Attending: Family Medicine

## 2015-07-13 ENCOUNTER — Ambulatory Visit: Payer: Self-pay | Attending: Internal Medicine | Admitting: Internal Medicine

## 2015-07-13 ENCOUNTER — Encounter: Payer: Self-pay | Admitting: Internal Medicine

## 2015-07-13 VITALS — BP 145/87 | HR 76 | Temp 98.0°F | Resp 16 | Ht 64.0 in | Wt 248.0 lb

## 2015-07-13 DIAGNOSIS — R159 Full incontinence of feces: Secondary | ICD-10-CM | POA: Insufficient documentation

## 2015-07-13 DIAGNOSIS — R32 Unspecified urinary incontinence: Secondary | ICD-10-CM | POA: Insufficient documentation

## 2015-07-13 DIAGNOSIS — R7989 Other specified abnormal findings of blood chemistry: Secondary | ICD-10-CM | POA: Insufficient documentation

## 2015-07-13 DIAGNOSIS — E790 Hyperuricemia without signs of inflammatory arthritis and tophaceous disease: Secondary | ICD-10-CM

## 2015-07-13 DIAGNOSIS — M6289 Other specified disorders of muscle: Secondary | ICD-10-CM | POA: Insufficient documentation

## 2015-07-13 DIAGNOSIS — Z23 Encounter for immunization: Secondary | ICD-10-CM | POA: Insufficient documentation

## 2015-07-13 DIAGNOSIS — G894 Chronic pain syndrome: Secondary | ICD-10-CM | POA: Insufficient documentation

## 2015-07-13 DIAGNOSIS — Z0000 Encounter for general adult medical examination without abnormal findings: Secondary | ICD-10-CM

## 2015-07-13 DIAGNOSIS — R531 Weakness: Secondary | ICD-10-CM

## 2015-07-13 DIAGNOSIS — Z820 Family history of epilepsy and other diseases of the nervous system: Secondary | ICD-10-CM | POA: Insufficient documentation

## 2015-07-13 DIAGNOSIS — I10 Essential (primary) hypertension: Secondary | ICD-10-CM | POA: Insufficient documentation

## 2015-07-13 LAB — COMPLETE METABOLIC PANEL WITH GFR
ALT: 14 U/L (ref 6–29)
AST: 15 U/L (ref 10–35)
Albumin: 3.9 g/dL (ref 3.6–5.1)
Alkaline Phosphatase: 84 U/L (ref 33–130)
BILIRUBIN TOTAL: 0.9 mg/dL (ref 0.2–1.2)
BUN: 20 mg/dL (ref 7–25)
CO2: 28 mmol/L (ref 20–31)
CREATININE: 1.4 mg/dL — AB (ref 0.50–1.05)
Calcium: 9.8 mg/dL (ref 8.6–10.4)
Chloride: 100 mmol/L (ref 98–110)
GFR, Est African American: 49 mL/min — ABNORMAL LOW (ref >=60)
GFR, Est Non African American: 42 mL/min — ABNORMAL LOW (ref >=60)
GLUCOSE: 105 mg/dL — AB (ref 65–99)
Potassium: 3.8 mmol/L (ref 3.5–5.3)
SODIUM: 139 mmol/L (ref 135–146)
TOTAL PROTEIN: 7.9 g/dL (ref 6.1–8.1)

## 2015-07-13 LAB — CBC WITH DIFFERENTIAL/PLATELET
Basophils Absolute: 0.1 10*3/uL (ref 0.0–0.1)
Basophils Relative: 1 % (ref 0–1)
EOS ABS: 0.6 10*3/uL (ref 0.0–0.7)
EOS PCT: 8 % — AB (ref 0–5)
HCT: 45.9 % (ref 36.0–46.0)
Hemoglobin: 15.1 g/dL — ABNORMAL HIGH (ref 12.0–15.0)
LYMPHS ABS: 3.1 10*3/uL (ref 0.7–4.0)
Lymphocytes Relative: 38 % (ref 12–46)
MCH: 30 pg (ref 26.0–34.0)
MCHC: 32.9 g/dL (ref 30.0–36.0)
MCV: 91.1 fL (ref 78.0–100.0)
MONOS PCT: 6 % (ref 3–12)
MPV: 11.1 fL (ref 8.6–12.4)
Monocytes Absolute: 0.5 10*3/uL (ref 0.1–1.0)
NEUTROS PCT: 47 % (ref 43–77)
Neutro Abs: 3.8 10*3/uL (ref 1.7–7.7)
PLATELETS: 269 10*3/uL (ref 150–400)
RBC: 5.04 MIL/uL (ref 3.87–5.11)
RDW: 13.5 % (ref 11.5–15.5)
WBC: 8.1 10*3/uL (ref 4.0–10.5)

## 2015-07-13 MED ORDER — DULOXETINE HCL 30 MG PO CPEP: 30.0000 mg | ORAL_CAPSULE | Freq: Every day | ORAL | Status: DC

## 2015-07-13 MED ORDER — GABAPENTIN 300 MG PO CAPS: 300.0000 mg | ORAL_CAPSULE | Freq: Two times a day (BID) | ORAL | Status: DC

## 2015-07-13 MED ORDER — ALLOPURINOL 100 MG PO TABS: 100.0000 mg | ORAL_TABLET | Freq: Every day | ORAL | Status: DC

## 2015-07-13 MED ORDER — CYCLOBENZAPRINE HCL 10 MG PO TABS: 10.0000 mg | ORAL_TABLET | Freq: Every day | ORAL | Status: DC

## 2015-07-13 MED ORDER — LOSARTAN POTASSIUM-HCTZ 100-25 MG PO TABS: 1.0000 | ORAL_TABLET | Freq: Every day | ORAL | Status: DC

## 2015-07-13 NOTE — Progress Notes (Deleted)
Patient ID: Tiffany Brady, female   DOB: Jun 30, 1961, 54 y.o.   MRN: [id number]   Tiffany Brady, is a 54 y.o. female  YTK:[id number]  FTD:[id number]  DOB - Nov 20, 1960  CC:  Chief Complaint  Patient presents with  . Follow-up       HPI:     Tiffany Brady is a 54 y.o. female here today to establish medical care with new provider. Patient has medical history significant for Hypertension, chronic pain syndrome,  Obesity, Back pain, Tobacco abuse, and Migraines. Patient presents with multiple complaints, left side weakness, numbness and tingling in her left side and in her facial area. Patient states weakness has progressed over last 3 months, patient states she "now has trouble with gait, and left leg gives way on her when walking". Patient also has significant weakness in the mornings upon waking. "Feels like it takes her several hours to get her left side to move - especially left arm." Patient was seen and evaluated by Dr Posey Pronto of [facility] on 05/07/15 for bilateral hand paresthesias patient had an NCV w/ EMG  on 9/13 with findings " Bilateral median neuropathy at or distal to the wrist, consistent with clinical diagnosis of carpal tunnel syndrome. Overall, these findings are mild in degree electrically and worse on the right. There is no evidence of a cervical radiculopathy or diffuse myopathy affecting upper extremities".    Patient also with complaints of bowel and bladder incontinence, bladder incontinence has been progressive over last few years, bowel incontinence is new onset with first occurrence 3 weeks ago. Patient states she has regular bowel movements, but then has episodes of incontinence (stools are loose but not diarrhea). Patient states she wears incontinence underwear or pad at all times. Patient with visual disturbances "seeing double, seeing things crawling on the walls, and trouble focusing vision". Visual disturbances have been ongoing, patient does not remember when  they started.  Patient stated she "believed symptoms were caused by mini strokes".  Patient has daughter who was diagnosed with Multiple Sclerosis at age 27-30. Patient has not been tested for MS.  Patient has No headache, No chest pain, No abdominal pain - No Nausea,  No Cough - SOB.  No Known Allergies Past Medical History  Diagnosis Date  . Hypertension   . Chronic pain syndrome   . Tobacco use    Current Outpatient Prescriptions on File Prior to Visit  Medication Sig Dispense Refill  . allopurinol (ZYLOPRIM) 100 MG tablet Take 1 tablet (100 mg total) by mouth daily. 30 tablet 6  . cyclobenzaprine (FLEXERIL) 10 MG tablet Take 1 tablet (10 mg total) by mouth at bedtime. 30 tablet 3  . DULoxetine (CYMBALTA) 30 MG capsule Take 1 capsule (30 mg total) by mouth daily. 120 capsule 3  . gabapentin (NEURONTIN) 300 MG capsule Take 1 capsule (300 mg total) by mouth 2 (two) times daily. 60 capsule 5  . losartan-hydrochlorothiazide (HYZAAR) 100-25 MG per tablet Take 1 tablet by mouth daily. 30 tablet 3  . Vitamin D, Ergocalciferol, (DRISDOL) 50000 UNITS CAPS capsule Take 1 capsule (50,000 Units total) by mouth every 7 (seven) days. Take 1 capsule by mouth every 7 days x 10 weeks 10 capsule 0   No current facility-administered medications on file prior to visit.   Family History  Problem Relation Age of Onset  . Cancer Mother     Deceased  . Hypertension Mother   . HIV/AIDS Brother     Deceased  . Multiple sclerosis Daughter   .  Other Father     Deceased, 62   Social History   Social History  . Marital Status: Widowed    Spouse Name: N/A  . Number of Children: N/A  . Years of Education: N/A   Occupational History  . Not on file.   Social History Main Topics  . Smoking status: Current Every Day Smoker -- 0.75 packs/day for 40 years    Types: Cigarettes  . Smokeless tobacco: Never Used  . Alcohol Use: 0.0 oz/week    0 Standard drinks or equivalent per week     Comment: 1 pint of  liquor lasts a month  . Drug Use: No  . Sexual Activity: Not on file   Other Topics Concern  . Not on file   Social History Narrative   Lives with niece and her 3 children in a 2 story home.     Only goes upstairs once a day.  Has 1 child.  Does not work.  Trying to get disability.  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     Education: 10th grade.    Review of Systems  Constitutional: Positive for malaise/fatigue.  Eyes: Negative.   Respiratory: Positive for shortness of breath.   Cardiovascular: Negative.   Gastrointestinal:       Bowel incontinence  Genitourinary: Positive for urgency and frequency.       Incontinence  Musculoskeletal: Positive for myalgias and back pain.       Left sided weakness and tingling.  Skin: Negative.   Neurological: Positive for tingling and headaches.       Facial and left sided weakness and tingling  Endo/Heme/Allergies: Negative.   Psychiatric/Behavioral: Negative.       Objective:   Filed Vitals:   07/13/15 1429  BP: 145/87  Pulse: 76  Temp: 98 F (36.7 C)  Resp: 16    Physical Exam  Constitutional: She is oriented to person, place, and time. She appears well-developed and well-nourished.  HENT:  Head: Normocephalic and atraumatic.  Right Ear: External ear normal.  Mouth/Throat: Oropharynx is clear and moist.  Eyes: Conjunctivae and EOM are normal. Pupils are equal, round, and reactive to light.  Snellen eye exam R20/40, L20/50. Cranial Nerves II, III, IV and VI intact.  Neck: Normal range of motion. Neck supple.  Cardiovascular: Normal rate, regular rhythm, normal heart sounds and intact distal pulses.   Pulmonary/Chest: Effort normal and breath sounds normal.  Abdominal: Soft. Bowel sounds are normal.  Genitourinary:  Rectal tone decreased upon digital exam  Musculoskeletal:       Arms: Left sided weakness and decreased strength LUE+4, LLE+4, Hyperreflexia left LLE,   Neurological: She is alert and  oriented to person, place, and time.  Gait disturbance and instability upon standing  Skin: Skin is warm and dry.  Nursing note and vitals reviewed.    Lab Results  Component Value Date   WBC 9.7 01/27/2014   HGB 15.3* 04/03/2014   HCT 45.0 04/03/2014   MCV 91.6 01/27/2014   PLT 265 01/27/2014   Lab Results  Component Value Date   CREATININE 1.25* 09/29/2014   BUN 15 09/29/2014   NA 138 09/29/2014   K 3.7 09/29/2014   CL 104 09/29/2014   CO2 27 09/29/2014    No results found for: HGBA1C Lipid Panel     Component Value Date/Time   CHOL 141 01/27/2014 1714   TRIG 64 01/27/2014 1714   HDL 44 01/27/2014 1714   CHOLHDL  3.2 01/27/2014 1714   VLDL 13 01/27/2014 1714   LDLCALC 84 01/27/2014 1714       Assessment and plan:  Tiffany Brady was seen today for follow-up.  Diagnoses and all orders for this visit:  Health care maintenance -     Flu Vaccine QUAD 36+ mos PF IM (Fluarix & Fluzone Quad PF)  Left-sided weakness -     Cancel: MR MRA HEAD W CONTRAST; Future -     MR MRA HEAD WO CONTRAST; Future -     COMPLETE METABOLIC PANEL WITH GFR       -     Rule out Multiple sclerosis  Family history of multiple sclerosis -     Cancel: MR MRA HEAD W CONTRAST; Future -     MR MRA HEAD WO CONTRAST; Future  Bowel and bladder incontinence -     Cancel: MR MRA HEAD W CONTRAST; Future -     MR MRA HEAD WO CONTRAST; Future -     Rule out Multiple sclerosis -     CBC with Differential  Elevated uric acid in blood -     allopurinol (ZYLOPRIM) 100 MG tablet; Take 1 tablet (100 mg total) by mouth daily.  Chronic pain syndrome -     DULoxetine (CYMBALTA) 30 MG capsule; Take 1 capsule (30 mg total) by mouth daily. -     cyclobenzaprine (FLEXERIL) 10 MG tablet; Take 1 tablet (10 mg total) by mouth at bedtime. -     gabapentin (NEURONTIN) 300 MG capsule; Take 1 capsule (300 mg total) by mouth 2 (two) times daily.  Essential hypertension -     losartan-hydrochlorothiazide (HYZAAR)  100-25 MG tablet; Take 1 tablet by mouth daily.    Return in about 3 months (around 10/13/2015) for Hypertension.  The patient was given clear instructions to go to ER or return to medical center if symptoms don't improve, worsen or new problems develop. The patient verbalized understanding. The patient was told to call to get lab results if they haven't heard anything in the next week.     Clois Dupes, RN, BSN, [facility] [phone] 07/13/2015, 3:12 PM

## 2015-07-13 NOTE — Progress Notes (Signed)
Patient ID: Tiffany Brady, female   DOB: 1961/08/04, 53 y.o.   MRN: [id number]   Tiffany Brady, is a 54 y.o. female  MPN:[id number]  MGQ:[id number]  DOB - 02/20/1961  CC:  Chief Complaint  Patient presents with  . Follow-up       HPI:     Tiffany Brady is a 54 y.o. female here today to establish medical care with new provider. Patient has medical history significant for Hypertension, chronic pain syndrome,  Obesity, Back pain, Tobacco abuse, and Migraines. Patient presents with multiple complaints, left side weakness, numbness and tingling in her left side and facial area. Patient states weakness has progressed over last 3 months. She states "now has trouble with gait, and left leg gives way on her when walking". Patient also has significant weakness in the mornings upon waking. "Feels like it takes her several hours to get her left side to move - especially left arm." Patient was seen and evaluated by Dr Posey Pronto of [facility] on 05/07/15 for bilateral hand paresthesias patient had an NCV w/ EMG  on 9/13 with findings that suggest carpal tunnel.  Patient also with complaints of bowel and bladder incontinence. Bladder incontinence has been progressive over last few years when coughing and sneezing. Bowel incontinence is new onset with first occurrence 3 weeks ago. Patient states she has regular bowel movements, but then has episodes of incontinence that she has no idea that she has stooled herself. Patient states she wears incontinence underwear or pad at all times. Patient with visual disturbances "seeing double, seeing things crawling on the walls, and trouble focusing". Visual disturbances have been ongoing, patient does not remember when they started.  Patient stated she "believed symptoms were caused by mini strokes".  Patient has daughter who was diagnosed with Multiple Sclerosis at age 44-30. Patient has not been tested for MS.    No Known Allergies Past Medical History  Diagnosis  Date  . Hypertension   . Chronic pain syndrome   . Tobacco use    Current Outpatient Prescriptions on File Prior to Visit  Medication Sig Dispense Refill  . allopurinol (ZYLOPRIM) 100 MG tablet Take 1 tablet (100 mg total) by mouth daily. 30 tablet 6  . cyclobenzaprine (FLEXERIL) 10 MG tablet Take 1 tablet (10 mg total) by mouth at bedtime. 30 tablet 3  . DULoxetine (CYMBALTA) 30 MG capsule Take 1 capsule (30 mg total) by mouth daily. 120 capsule 3  . gabapentin (NEURONTIN) 300 MG capsule Take 1 capsule (300 mg total) by mouth 2 (two) times daily. 60 capsule 5  . losartan-hydrochlorothiazide (HYZAAR) 100-25 MG per tablet Take 1 tablet by mouth daily. 30 tablet 3  . Vitamin D, Ergocalciferol, (DRISDOL) 50000 UNITS CAPS capsule Take 1 capsule (50,000 Units total) by mouth every 7 (seven) days. Take 1 capsule by mouth every 7 days x 10 weeks 10 capsule 0   No current facility-administered medications on file prior to visit.   Family History  Problem Relation Age of Onset  . Cancer Mother     Deceased  . Hypertension Mother   . HIV/AIDS Brother     Deceased  . Multiple sclerosis Daughter   . Other Father     Deceased, 12   Social History   Social History  . Marital Status: Widowed    Spouse Name: N/A  . Number of Children: N/A  . Years of Education: N/A   Occupational History  . Not on file.   Social History Main Topics  .  Smoking status: Current Every Day Smoker -- 0.75 packs/day for 40 years    Types: Cigarettes  . Smokeless tobacco: Never Used  . Alcohol Use: 0.0 oz/week    0 Standard drinks or equivalent per week     Comment: 1 pint of liquor lasts a month  . Drug Use: No  . Sexual Activity: Not on file   Other Topics Concern  . Not on file   Social History Narrative   Lives with niece and her 3 children in a 2 story home.     Only goes upstairs once a day.  Has 1 child.  Does not work.  Trying to get disability.  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     Education: 10th grade.    Review of Systems  Constitutional: Positive for malaise/fatigue.  Eyes: Negative.   Respiratory: Positive for shortness of breath.   Cardiovascular: Negative.   Gastrointestinal:       Bowel incontinence  Genitourinary: Positive for urgency and frequency.       Incontinence  Musculoskeletal: Positive for myalgias and back pain.       Left sided weakness and tingling.  Skin: Negative.   Neurological: Positive for tingling and headaches.       Facial and left sided weakness and tingling  Endo/Heme/Allergies: Negative.   Psychiatric/Behavioral: Negative.       Objective:   Filed Vitals:   07/13/15 1429  BP: 145/87  Pulse: 76  Temp: 98 F (36.7 C)  Resp: 16    Physical Exam  Constitutional: She is oriented to person, place, and time. She appears well-developed and well-nourished.  HENT:  Head: Normocephalic and atraumatic.  Right Ear: External ear normal.  Mouth/Throat: Oropharynx is clear and moist.  Eyes: Conjunctivae and EOM are normal. Pupils are equal, round, and reactive to light.  Snellen eye exam R20/40, L20/50. Cranial Nerves II, III, IV and VI intact.  Neck: Normal range of motion. Neck supple.  Cardiovascular: Normal rate, regular rhythm, normal heart sounds and intact distal pulses.   Pulmonary/Chest: Effort normal and breath sounds normal.  Abdominal: Soft. Bowel sounds are normal.  Genitourinary:  Rectal tone decreased upon digital exam  Musculoskeletal:       Arms: Left sided weakness and decreased strength LUE+4, LLE+4, Hyperreflexia left LLE,   Neurological: She is alert and oriented to person, place, and time.  Gait disturbance and instability upon standing  Skin: Skin is warm and dry.  Nursing note and vitals reviewed.    Lab Results  Component Value Date   WBC 9.7 01/27/2014   HGB 15.3* 04/03/2014   HCT 45.0 04/03/2014   MCV 91.6 01/27/2014   PLT 265 01/27/2014   Lab Results  Component Value Date     CREATININE 1.25* 09/29/2014   BUN 15 09/29/2014   NA 138 09/29/2014   K 3.7 09/29/2014   CL 104 09/29/2014   CO2 27 09/29/2014    No results found for: HGBA1C Lipid Panel     Component Value Date/Time   CHOL 141 01/27/2014 1714   TRIG 64 01/27/2014 1714   HDL 44 01/27/2014 1714   CHOLHDL 3.2 01/27/2014 1714   VLDL 13 01/27/2014 1714   LDLCALC 84 01/27/2014 1714       Assessment and plan:  Tiffany Brady was seen today for follow-up.  Diagnoses and all orders for this visit:  Bowel and bladder incontinence Ordered MR Brain W Wo Contrast; Future---scheduled for November 11th. Due  to patients symptoms and family history I will r/o MS. I will also have patient to schedule appointment with Neurology pending results of MRI Explained signs and symptoms that should warrant immediate attention.  Patient verbalized understanding with teach back used. Discussed case with Dr. Doreene Burke as well who has agreed that patient should be evaluated for MS. Left-sided weakness -     COMPLETE METABOLIC PANEL WITH GFR Discussed risk for falls. She may use cane to help with gait disturbances  Family history of multiple sclerosis See above  Essential hypertension -     losartan-hydrochlorothiazide (HYZAAR) 100-25 MG tablet; Take 1 tablet by mouth daily. Patients pressure is elevated today but past pressures have been normal. I will monitor her at next visit and determine if any medication changes are necessary.   Chronic pain syndrome -     DULoxetine (CYMBALTA) 30 MG capsule; Take 1 capsule (30 mg total) by mouth daily. -     cyclobenzaprine (FLEXERIL) 10 MG tablet; Take 1 tablet (10 mg total) by mouth at bedtime. -     gabapentin (NEURONTIN) 300 MG capsule; Take 1 capsule (300 mg total) by mouth 2 (two) times daily. Stable, meds refilled   Elevated uric acid in blood -     allopurinol (ZYLOPRIM) 100 MG tablet; Take 1 tablet (100 mg total) by mouth daily.  Health care maintenance -     Flu  Vaccine QUAD 36+ mos PF IM (Fluarix & Fluzone Quad PF)  Return in about 3 months (around 10/13/2015) for Hypertension.   Lance Bosch, NP 07/13/2015 8:59 PM

## 2015-07-13 NOTE — Progress Notes (Signed)
Patient here for follow up  Patient complains of not being able to control her bowels Has been having a few accidents here and there Also complains of having some pain to her left leg that is ungoing

## 2015-07-24 ENCOUNTER — Ambulatory Visit (HOSPITAL_COMMUNITY)
Admission: RE | Admit: 2015-07-24 | Discharge: 2015-07-24 | Disposition: A | Discharge status: Home / Self Care | Payer: Self-pay | Source: Ambulatory Visit | Attending: Internal Medicine | Admitting: Internal Medicine

## 2015-07-24 DIAGNOSIS — M6289 Other specified disorders of muscle: Secondary | ICD-10-CM | POA: Insufficient documentation

## 2015-07-24 DIAGNOSIS — Z820 Family history of epilepsy and other diseases of the nervous system: Secondary | ICD-10-CM | POA: Insufficient documentation

## 2015-07-24 DIAGNOSIS — R32 Unspecified urinary incontinence: Secondary | ICD-10-CM | POA: Insufficient documentation

## 2015-07-24 DIAGNOSIS — R200 Anesthesia of skin: Secondary | ICD-10-CM | POA: Insufficient documentation

## 2015-07-24 DIAGNOSIS — M79609 Pain in unspecified limb: Secondary | ICD-10-CM | POA: Insufficient documentation

## 2015-07-24 DIAGNOSIS — R159 Full incontinence of feces: Secondary | ICD-10-CM | POA: Insufficient documentation

## 2015-07-24 DIAGNOSIS — R531 Weakness: Secondary | ICD-10-CM

## 2015-07-24 DIAGNOSIS — M47892 Other spondylosis, cervical region: Secondary | ICD-10-CM | POA: Insufficient documentation

## 2015-07-24 MED ORDER — GADOBENATE DIMEGLUMINE 529 MG/ML IV SOLN
20.0000 mL | Freq: Once | INTRAVENOUS | Status: AC | PRN
  Administered 2015-07-24: 20 mL via INTRAVENOUS

## 2015-07-27 ENCOUNTER — Telehealth: Payer: Self-pay | Admitting: Internal Medicine

## 2015-07-27 ENCOUNTER — Other Ambulatory Visit: Payer: Self-pay | Admitting: Internal Medicine

## 2015-07-27 DIAGNOSIS — M5020 Other cervical disc displacement, unspecified cervical region: Secondary | ICD-10-CM

## 2015-07-27 MED ORDER — PREDNISONE 50 MG PO TABS: ORAL_TABLET | ORAL | Status: DC

## 2015-07-27 NOTE — Telephone Encounter (Signed)
Called patient to inform her that her MRI of her brain was abnormal and that there is high suspicion for multiple Sclerosis. I have forwarded MRI to her Neurologist and I have asked patient to schedule appt with Dr. Posey Pronto very soon. She states that at this time she is having shaking of her lower extremity and still having bowel incontinence. I will send referral for cervical disc protrusion and start her on low dose steroids.

## 2015-07-28 ENCOUNTER — Other Ambulatory Visit: Payer: Self-pay | Admitting: *Deleted

## 2015-07-28 DIAGNOSIS — R202 Paresthesia of skin: Secondary | ICD-10-CM

## 2015-07-28 DIAGNOSIS — R200 Anesthesia of skin: Secondary | ICD-10-CM

## 2015-07-28 DIAGNOSIS — G894 Chronic pain syndrome: Secondary | ICD-10-CM

## 2015-07-31 ENCOUNTER — Ambulatory Visit (HOSPITAL_COMMUNITY)
Admission: RE | Admit: 2015-07-31 | Discharge: 2015-07-31 | Disposition: A | Discharge status: Home / Self Care | Payer: Self-pay | Source: Ambulatory Visit | Attending: Neurology | Admitting: Neurology

## 2015-07-31 DIAGNOSIS — M50321 Other cervical disc degeneration at C4-C5 level: Secondary | ICD-10-CM | POA: Insufficient documentation

## 2015-07-31 DIAGNOSIS — R200 Anesthesia of skin: Secondary | ICD-10-CM | POA: Insufficient documentation

## 2015-07-31 DIAGNOSIS — G894 Chronic pain syndrome: Secondary | ICD-10-CM | POA: Insufficient documentation

## 2015-07-31 DIAGNOSIS — R202 Paresthesia of skin: Secondary | ICD-10-CM | POA: Insufficient documentation

## 2015-07-31 DIAGNOSIS — M4802 Spinal stenosis, cervical region: Secondary | ICD-10-CM | POA: Insufficient documentation

## 2015-07-31 DIAGNOSIS — M2578 Osteophyte, vertebrae: Secondary | ICD-10-CM | POA: Insufficient documentation

## 2015-07-31 MED ORDER — GADOBENATE DIMEGLUMINE 529 MG/ML IV SOLN
20.0000 mL | Freq: Once | INTRAVENOUS | Status: AC | PRN
  Administered 2015-07-31: 20 mL via INTRAVENOUS

## 2015-08-03 ENCOUNTER — Encounter: Payer: Self-pay | Admitting: Neurology

## 2015-08-03 ENCOUNTER — Ambulatory Visit (INDEPENDENT_AMBULATORY_CARE_PROVIDER_SITE_OTHER): Payer: Self-pay | Admitting: Neurology

## 2015-08-03 VITALS — BP 132/78 | HR 79 | Ht 64.0 in | Wt 253.0 lb

## 2015-08-03 DIAGNOSIS — E559 Vitamin D deficiency, unspecified: Secondary | ICD-10-CM

## 2015-08-03 DIAGNOSIS — G379 Demyelinating disease of central nervous system, unspecified: Secondary | ICD-10-CM

## 2015-08-03 DIAGNOSIS — M797 Fibromyalgia: Secondary | ICD-10-CM

## 2015-08-03 DIAGNOSIS — R202 Paresthesia of skin: Secondary | ICD-10-CM

## 2015-08-03 DIAGNOSIS — G5603 Carpal tunnel syndrome, bilateral upper limbs: Secondary | ICD-10-CM

## 2015-08-03 MED ORDER — VITAMIN D (ERGOCALCIFEROL) 1.25 MG (50000 UNIT) PO CAPS: 50000.0000 [IU] | ORAL_CAPSULE | ORAL | Status: DC

## 2015-08-03 NOTE — Progress Notes (Signed)
Follow-up Visit   Date: 08/03/2015   Tiffany Brady MRN: [id number] DOB: 16-Oct-1960   Interim History: Tiffany Brady is a 54 y.o. right-handed African American female with chronic pain syndrome, fibromyaglia, hypertension, and tobacco use returning to the clinic for follow-up of new complaints of urinary incontinence and left sided weakness and pain.  The patient was accompanied to the clinic by self.  History of present illness: Starting in July 2016, she began experiencing numbness/tingling over the tips of her fingers on both hands which has steadily involved the whole hand. Denies any exacerbating or alleviating factors. She often tries to shake her hands because they always feel asleep. She also has numbness over the right leg above the knees, but this is intermittent. She endorses pain related weakness and has difficulty with walking and doing daily activities such as bathing and cooking because of the pain.   She also has a lot of whole body pain described as soreness which is tender to palpation. She feels as if someone cut her and bleeding on the inside or as if someone is breaking her collar bone.  She was recently started on Cymbalta helps alleviate the pain where she can function, but it is still always there. Her ANA, RF, and TSH is normal.   UPDATE 08/03/2015:  Patient presented to her PCP on 10/31 with complaints of generalized weakness, gait difficulty, bowel and bladder incontinenced and concerned about MS due to her daughter diagnosed with this.  She underwent MRI brain which showed white matter changes involving the periventricular region and subcortical white matter, concerning for MS.  Although initial imaging also suggested cervical cord compression, subsequent imaging of her cervical spine only showed multilevel spinal stenosis without cord involvement.  Patient is here for further evaluation.    She complains episodic left sided weakness and numbness  occuring several times per day, lasting a few minutes. She reports having urge incontinence for the past 15 year, which is triggered by sneezing, coughing, or laughing. She does not have a regular OBGYN provider. She complains of gait difficulty but denies walks and walks unassisted.  She has chronic pain involving her whole body.   Medications:  Current Outpatient Prescriptions on File Prior to Visit  Medication Sig Dispense Refill  . allopurinol (ZYLOPRIM) 100 MG tablet Take 1 tablet (100 mg total) by mouth daily. 30 tablet 6  . cyclobenzaprine (FLEXERIL) 10 MG tablet Take 1 tablet (10 mg total) by mouth at bedtime. 30 tablet 3  . DULoxetine (CYMBALTA) 30 MG capsule Take 1 capsule (30 mg total) by mouth daily. 120 capsule 3  . gabapentin (NEURONTIN) 300 MG capsule Take 1 capsule (300 mg total) by mouth 2 (two) times daily. 60 capsule 5  . losartan-hydrochlorothiazide (HYZAAR) 100-25 MG tablet Take 1 tablet by mouth daily. 30 tablet 3  . predniSONE (DELTASONE) 50 MG tablet Take one pill per day for 5 days (Patient not taking: Reported on 08/03/2015) 5 tablet 0   No current facility-administered medications on file prior to visit.    Allergies: No Known Allergies  Review of Systems:  CONSTITUTIONAL: No fevers, chills, night sweats, or weight loss.  EYES: No visual changes or eye pain ENT: No hearing changes.  No history of nose bleeds.   RESPIRATORY: No cough, wheezing and shortness of breath.   CARDIOVASCULAR: Negative for chest pain, and palpitations.   GI: Negative for abdominal discomfort, blood in stools or black stools.  No recent change in bowel habits.  GU:  +history of incontinence.   MUSCLOSKELETAL: +history of joint pain or swelling.  +myalgias.   SKIN: Negative for lesions, rash, and itching.   ENDOCRINE: Negative for cold or heat intolerance, polydipsia or goiter.   PSYCH:  + depression or anxiety symptoms.   NEURO: As Above.   Vital Signs:  BP 132/78 mmHg  Pulse 79   Ht 5' 4"  (1.626 m)  Wt 253 lb (114.76 kg)  BMI 43.41 kg/m2  LMP 04/21/2014  Neurological Exam: MENTAL STATUS including orientation to time, place, person, recent and remote memory, attention span and concentration, language, and fund of knowledge is normal.  Speech is not dysarthric.  CRANIAL NERVES: No visual field defects. Pupils equal round and reactive to light.  Normal conjugate, extra-ocular eye movements in all directions of gaze.  No ptosis. Normal facial sensation.  Face is symmetric. Palate elevates symmetrically.  Tongue is midline.  MOTOR:  Motor strength is at least antigravity in all extremities, there is significant give-way weakness throughout.  No atrophy, fasciculations or abnormal movements.  No pronator drift.  Tone is normal.    MSRs:  Reflexes are 2+/4 throughout  SENSORY:  Intact to vibration and temperature throughout.  COORDINATION/GAIT:  Normal finger-to- nose-finger and heel-to-shin.  Slowed finger tapping and movement throughout.  Gait is wide-based, slow, and inconsistently unsteady.  Data: MRI cervical spine wwo contrast 07/31/2015: Multilevel degenerative change in the cervical spine with mild spinal stenosis at C3-4, C4-5, C5-6. Left foraminal narrowing at C5-6 due to disc and osteophyte  Spinal cord appears normal without cord lesion or abnormal enhancement. No cord compression.  MRI brain wwo contrast 07/24/2015: Extensive and premature for age white matter signal abnormality strongly suspicious for multiple sclerosis. Chronic microvascular ischemic changes are not excluded, but are less favored.  No restricted diffusion or abnormal postcontrast enhancement at any location, but specifically in association with the white matter, to suggest an acute process.  Cervical spondylosis with suspected central disc protrusion/extrusion at C4-5 resulting in cord compression. This could contribute to numbness and pain in the extremities. Correlate clinically  for radiculopathy or myelopathy. MRI cervical spine may be helpful in further evaluation as clinically indicated.   EMG of the upper extremities 05/26/2015: 1. Bilateral median neuropathy at or distal to the wrist, consistent with clinical diagnosis of carpal tunnel syndrome. Overall, these findings are mild in degree electrically and worse on the right. 2. There is no evidence of a cervical radiculopathy or diffuse myopathy affecting upper extremities.  Lab Results  Component Value Date   TSH 2.018 01/27/2014   Labs 09/29/2014: RF 12, ANA neg, ESR 23,  IMPRESSION/PLAN: 1.  White matter changes on MRI brain, concerning for demyelinating disease.  Imaging was personally reviewed with patient.  Proceed with CSF testing to establish diagnosis. CSF testing to include: CSF cell count and diff, protein, glucose, routine cultures, fungal cultures, IgG index, oligoclonal bands, myelin basic protein, flow cytology. Discussed that even if she does have MS, all of her pain cannot be explained by this and she most likely does have fibromyalgia or chronic pain syndrome.  It may, however, explain her paresthesias and weakness. Her chronic incontinence is most suggestive of urge incontinence, less likely neurogenic  2.  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  Continue gabapentin 310m BID  3.  Bilateral carpal tunnel syndrome, recommend wearing wrist splints  4.  Vitamin D deficiency, start vitamin D 50000 units weekly  5.  Chronic history of urge incontinence  6.  Fibromyalgia and chronic pain syndrome.  Very mild elevation in CK is normal for patient has this can often seen in darker pigmented individuals. There is no evidence of myopathy on her EMG  Return to clinic in 2 months    The duration of this appointment visit was 30 minutes of face-to-face time with the patient.  Greater than 50% of this time was spent in  counseling, explanation of diagnosis, planning of further management, and coordination of care.   Thank you for allowing me to participate in patient's care.  If I can answer any additional questions, I would be pleased to do so.    Sincerely,    Donika K. Posey Pronto, DO

## 2015-08-03 NOTE — Patient Instructions (Addendum)
1.  We will order spinal fluid testing to determine if you do have multiple sclerosis.  We will call you with the results 2.  Recommend using wrist splint for carpal tunnel syndrome 3.  Start vitamin D supplementation. 4.  Return to clinic in 2 months

## 2015-08-04 ENCOUNTER — Other Ambulatory Visit: Payer: Self-pay | Admitting: *Deleted

## 2015-08-04 ENCOUNTER — Telehealth: Payer: Self-pay

## 2015-08-04 DIAGNOSIS — G894 Chronic pain syndrome: Secondary | ICD-10-CM

## 2015-08-04 DIAGNOSIS — R200 Anesthesia of skin: Secondary | ICD-10-CM

## 2015-08-04 DIAGNOSIS — R202 Paresthesia of skin: Secondary | ICD-10-CM

## 2015-08-04 DIAGNOSIS — G379 Demyelinating disease of central nervous system, unspecified: Secondary | ICD-10-CM

## 2015-08-04 NOTE — Progress Notes (Signed)
The order has been placed.  Did you want flow cytometry or cytology or both?   I put in both but will let Anderson Malta know which one when I call her.

## 2015-08-04 NOTE — Telephone Encounter (Signed)
-----   Message from Lance Bosch, NP sent at 08/03/2015  5:17 PM EST ----- Kidney function elevated. I want her to avoid all NSAID's. We will recheck in 3 months.

## 2015-08-04 NOTE — Progress Notes (Signed)
LP ordered and labs faxed to [facility] at [facility].

## 2015-08-04 NOTE — Telephone Encounter (Signed)
Spoke with patient and she is aware of her lab results

## 2015-08-14 ENCOUNTER — Other Ambulatory Visit: Payer: Self-pay | Admitting: Neurology

## 2015-08-14 ENCOUNTER — Other Ambulatory Visit (HOSPITAL_COMMUNITY)
Admission: RE | Admit: 2015-08-14 | Discharge: 2015-08-14 | Disposition: A | Discharge status: Home / Self Care | Payer: Self-pay | Source: Ambulatory Visit | Attending: Neurology | Admitting: Neurology

## 2015-08-14 ENCOUNTER — Ambulatory Visit
Admission: RE | Admit: 2015-08-14 | Discharge: 2015-08-14 | Disposition: A | Discharge status: Home / Self Care | Payer: Self-pay | Source: Ambulatory Visit | Attending: Neurology | Admitting: Neurology

## 2015-08-14 DIAGNOSIS — R200 Anesthesia of skin: Secondary | ICD-10-CM | POA: Insufficient documentation

## 2015-08-14 DIAGNOSIS — G894 Chronic pain syndrome: Secondary | ICD-10-CM | POA: Insufficient documentation

## 2015-08-14 DIAGNOSIS — G379 Demyelinating disease of central nervous system, unspecified: Secondary | ICD-10-CM | POA: Insufficient documentation

## 2015-08-14 DIAGNOSIS — R202 Paresthesia of skin: Secondary | ICD-10-CM | POA: Insufficient documentation

## 2015-08-14 LAB — CSF CELL COUNT WITH DIFFERENTIAL
RBC Count, CSF: 34 uL — ABNORMAL HIGH
Tube #: 3
WBC, CSF: 0 uL (ref 0–5)

## 2015-08-14 LAB — GLUCOSE, CSF: Glucose, CSF: 67 mg/dL (ref 43–76)

## 2015-08-14 LAB — PROTEIN, CSF: Total Protein, CSF: 37 mg/dL (ref 15–45)

## 2015-08-14 NOTE — Progress Notes (Signed)
1 SST tube drawn from left AC. Site is unremarkable and pt tolerated procedure well. Discharge instructions explained to pt. 

## 2015-08-14 NOTE — Discharge Instructions (Signed)

## 2015-08-17 LAB — IGG,CSF: IGG, CSF: 2.8 mg/dL (ref 0.8–7.7)

## 2015-08-17 LAB — CSF CULTURE W GRAM STAIN: Organism ID, Bacteria: NO GROWTH

## 2015-08-17 LAB — CSF CULTURE
GRAM STAIN: NONE SEEN
GRAM STAIN: NONE SEEN

## 2015-08-20 LAB — MYELIN BASIC PROTEIN, CSF

## 2015-08-20 LAB — OLIGOCLONAL BANDS, CSF + SERM

## 2015-08-24 ENCOUNTER — Other Ambulatory Visit: Payer: Self-pay | Admitting: Internal Medicine

## 2015-09-04 ENCOUNTER — Ambulatory Visit (INDEPENDENT_AMBULATORY_CARE_PROVIDER_SITE_OTHER): Payer: Self-pay | Admitting: Neurology

## 2015-09-04 ENCOUNTER — Encounter: Payer: Self-pay | Admitting: Neurology

## 2015-09-04 VITALS — BP 126/80 | HR 74 | Ht 64.0 in | Wt 249.0 lb

## 2015-09-04 DIAGNOSIS — G894 Chronic pain syndrome: Secondary | ICD-10-CM

## 2015-09-04 DIAGNOSIS — M797 Fibromyalgia: Secondary | ICD-10-CM

## 2015-09-04 DIAGNOSIS — G35 Multiple sclerosis: Secondary | ICD-10-CM

## 2015-09-04 MED ORDER — GABAPENTIN 300 MG PO CAPS: 600.0000 mg | ORAL_CAPSULE | Freq: Three times a day (TID) | ORAL | Status: DC

## 2015-09-04 NOTE — Patient Instructions (Addendum)
1.  Gabapentin instructions:  Day 1-3:  Morning: 300 mg           Noon:  300mg   Bedtime: 300 mg  Day 4-6:  Morning: 300 mg             Noon:  300mg   Bedtime: 600 mg  Day 7-9:  Morning: 600 mg             Noon: 300 mg    Bedtime: 600 mg  Day 10-12:  Morning 600mg             Noon:  600mg   Bedtime:  600mg   and continue 2 tablets three times daily  Stay on the lower dose, if you are sleepy.  2.  Continue vitamin D supplementation weekly  3.  Start solumedrol infusions daily for three days  4.  Literature provided on MS medications to review  5.  Please have your eyes checked with an eye doctor  Return to clinic in 2 months

## 2015-09-04 NOTE — Progress Notes (Addendum)
Follow-up Visit   Date: 09/04/2015   Tiffany Brady MRN: [id number] DOB: November 26, 1960   Interim History: Tiffany Brady is a 54 y.o. right-handed African American female with chronic pain syndrome, fibromyaglia, hypertension, and tobacco use returning to the clinic for follow-up of left sided weakness and pain.  The patient was accompanied to the clinic by self.  History of present illness: Starting in July 2016, she began experiencing numbness/tingling over the tips of her fingers on both hands which has steadily involved the whole hand. Denies any exacerbating or alleviating factors. She often tries to shake her hands because they always feel asleep. She also has numbness over the right leg above the knees, but this is intermittent. She endorses pain related weakness and has difficulty with walking and doing daily activities such as bathing and cooking because of the pain.   She also has a lot of whole body pain described as soreness which is tender to palpation. She feels as if someone cut her and bleeding on the inside or as if someone is breaking her collar bone.  She was recently started on Cymbalta helps alleviate the pain where she can function, but it is still always there. Her ANA, RF, and TSH is normal.   UPDATE 08/03/2015:  Patient presented to her PCP on 10/31 with complaints of generalized weakness, gait difficulty, bowel and bladder incontinenced and concerned about MS due to her daughter diagnosed with this.  She underwent MRI brain which showed white matter changes involving the periventricular region and subcortical white matter, concerning for MS.  Although initial imaging also suggested cervical cord compression, subsequent imaging of her cervical spine only showed multilevel spinal stenosis without cord involvement.   She complains episodic left sided weakness and numbness occuring several times per day, lasting a few minutes. She reports having urge incontinence  for the past 15 year, which is triggered by sneezing, coughing, or laughing. She does not have a regular OBGYN provider. She complains of gait difficulty but denies walks and walks unassisted.  She has chronic pain involving her whole body.  UPDATE 09/04/2015:  She started gabapentin 3101m BID but had not noticed any changes in her left arm.  She is not using wrist splints due to expense.  She underwent CSF testing which showed normal IgG index, but there was increased oligoclonal bands both in the CSF and serum, but moreso apparent in the CSF.  She denies any new complaints.  She is seeing ortho tomorrow for left knee buckling and pain.   Medications:  Current Outpatient Prescriptions on File Prior to Visit  Medication Sig Dispense Refill  . allopurinol (ZYLOPRIM) 100 MG tablet Take 1 tablet (100 mg total) by mouth daily. 30 tablet 6  . atenolol (TENORMIN) 50 MG tablet TAKE 1 TABLET BY MOUTH DAILY 30 tablet 3  . cyclobenzaprine (FLEXERIL) 10 MG tablet Take 1 tablet (10 mg total) by mouth at bedtime. 30 tablet 3  . DULoxetine (CYMBALTA) 30 MG capsule Take 1 capsule (30 mg total) by mouth daily. 120 capsule 3  . losartan-hydrochlorothiazide (HYZAAR) 100-25 MG tablet Take 1 tablet by mouth daily. 30 tablet 3  . Vitamin D, Ergocalciferol, (DRISDOL) 50000 UNITS CAPS capsule Take 1 capsule (50,000 Units total) by mouth every 7 (seven) days. Take 1 capsule by mouth every 7 days x 10 weeks 10 capsule 0   No current facility-administered medications on file prior to visit.    Allergies: No Known Allergies  Review of Systems:  CONSTITUTIONAL:  No fevers, chills, night sweats, or weight loss.  EYES: No visual changes or eye pain ENT: No hearing changes.  No history of nose bleeds.   RESPIRATORY: No cough, wheezing and shortness of breath.   CARDIOVASCULAR: Negative for chest pain, and palpitations.   GI: Negative for abdominal discomfort, blood in stools or black stools.  No recent change in bowel  habits.   GU:  +history of incontinence.   MUSCLOSKELETAL: +history of joint pain or swelling.  +myalgias.   SKIN: Negative for lesions, rash, and itching.   ENDOCRINE: Negative for cold or heat intolerance, polydipsia or goiter.   PSYCH:  + depression or anxiety symptoms.   NEURO: As Above.   Vital Signs:  BP 126/80 mmHg  Pulse 74  Ht _0  (1.626 m)  Wt 249 lb (112.946 kg)  BMI 42.72 kg/m2  SpO2 95%  LMP 04/21/2014  Neurological Exam: MENTAL STATUS including orientation to time, place, person, recent and remote memory, attention span and concentration, language, and fund of knowledge is normal.  Speech is not dysarthric.  CRANIAL NERVES: No visual field defects. Pupils equal round and reactive to light.  Normal conjugate, extra-ocular eye movements in all directions of gaze.  No ptosis. Normal facial sensation.  Face is symmetric. Palate elevates symmetrically.  Tongue is midline.  MOTOR:  Motor strength is at least antigravity in all extremities, there is give-way weakness throughout.  No atrophy, fasciculations or abnormal movements.  No pronator drift.  Tone is normal.    MSRs:  Reflexes are 2+/4 throughout  SENSORY:  Intact to vibration and temperature throughout.  COORDINATION/GAIT:  Normal finger-to- nose-finger and heel-to-shin.  Slowed finger tapping and movement throughout.  Gait is wide-based, slow.  Data: MRI cervical spine wwo contrast 07/31/2015: Multilevel degenerative change in the cervical spine with mild spinal stenosis at C3-4, C4-5, C5-6. Left foraminal narrowing at C5-6 due to disc and osteophyte  Spinal cord appears normal without cord lesion or abnormal enhancement. No cord compression.  MRI brain wwo contrast 07/24/2015: Extensive and premature for age white matter signal abnormality strongly suspicious for multiple sclerosis. Chronic microvascular ischemic changes are not excluded, but are less favored.  No restricted diffusion or abnormal  postcontrast enhancement at any location, but specifically in association with the white matter, to suggest an acute process.  Cervical spondylosis with suspected central disc protrusion/extrusion at C4-5 resulting in cord compression. This could contribute to numbness and pain in the extremities. Correlate clinically for radiculopathy or myelopathy. MRI cervical spine may be helpful in further evaluation as clinically indicated.   EMG of the upper extremities 05/26/2015: 1. Bilateral median neuropathy at or distal to the wrist, consistent with clinical diagnosis of carpal tunnel syndrome. Overall, these findings are mild in degree electrically and worse on the right. 2. There is no evidence of a cervical radiculopathy or diffuse myopathy affecting upper extremities.  Lab Results  Component Value Date   TSH 2.018 01/27/2014   Labs 09/29/2014: RF 12, ANA neg, ESR 23  CSF testing 08/14/2015:  R34 W0 P37 G67, IgG index 2.8, MPB < 2.0, OCB  >5 well defined gamma restriction bands that are also present in the  patient's corresponding serum sample, but some bands in the CSF are more prominent  IMPRESSION/PLAN: 1.  Most likely new diagnosis of multiple sclerosis based on white matter changes on MRI brain and increased oligoclonal bands in the CSF.  IgG index was normal.  She does not have prior imaging for comparison, but  based on the distribution of her white matter changes, this is most likely MS.  Currently, she is complaining of left arm pain and paresthesias, so I will offer a trial of solumedrol 1g x 3 days to see if there is any improvement.  Difficult to differentiate whether her discomfort is due to other underlying processes such as known cervical foraminal stenosis at C5-6, fibromyalgia, and chronic pain syndrome.   - Recommend dilated eye exam - Start solumedrol 1g x 3 days - Literature provided on MS medications which we can decide at next visit  Discussed that even if she does have MS,  all of her pain cannot be explained by this and she most likely does have fibromyalgia or chronic pain syndrome.  It may, however, explain her paresthesias and weakness. Her chronic incontinence is most suggestive of urge incontinence, less likely neurogenic  2.  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  Increase gabapentin to 646m TID (titration schedule provided)  3.  Bilateral carpal tunnel syndrome, recommend wearing wrist splints  4.  Vitamin D deficiency, continue vitamin D 50000 units weekly  5.  Chronic history of urge incontinence  6.  Fibromyalgia and chronic pain syndrome.  Very mild elevation in CK is normal for patient has this can often seen in darker pigmented individuals. There is no evidence of myopathy on her EMG  Return to clinic in 2 months    The duration of this appointment visit was 30 minutes of face-to-face time with the patient.  Greater than 50% of this time was spent in counseling, explanation of diagnosis, planning of further management, and coordination of care.   Thank you for allowing me to participate in patient's care.  If I can answer any additional questions, I would be pleased to do so.    Sincerely,    Myka Hitz K. PPosey Pronto DO

## 2015-09-09 ENCOUNTER — Other Ambulatory Visit: Payer: Self-pay | Admitting: *Deleted

## 2015-09-09 DIAGNOSIS — G894 Chronic pain syndrome: Secondary | ICD-10-CM

## 2015-09-09 MED ORDER — DULOXETINE HCL 30 MG PO CPEP: 30.0000 mg | ORAL_CAPSULE | Freq: Every day | ORAL | Status: DC

## 2015-09-09 NOTE — Telephone Encounter (Signed)
PASS PROGRAM 

## 2015-09-11 ENCOUNTER — Telehealth: Payer: Self-pay | Admitting: *Deleted

## 2015-09-11 ENCOUNTER — Ambulatory Visit (HOSPITAL_COMMUNITY): Admission: RE | Admit: 2015-09-11 | Payer: Self-pay | Source: Ambulatory Visit

## 2015-09-11 NOTE — Telephone Encounter (Signed)
Tiffany Brady called to let me know that patient did not show up for her appointment.  I called patient and she forgot about it.  I gave her the phone # to short stay to call and reschedule.

## 2015-09-15 LAB — FUNGUS CULTURE W SMEAR: SMEAR RESULT: NONE SEEN

## 2015-09-16 ENCOUNTER — Telehealth: Payer: Self-pay | Admitting: Neurology

## 2015-09-16 NOTE — Telephone Encounter (Signed)
Patient called back for phone # to short stay so that she can reschedule her infusions.

## 2015-09-16 NOTE — Telephone Encounter (Signed)
PT called in regards to some shots she is supposed to have/Dawn [phone]

## 2015-09-21 ENCOUNTER — Ambulatory Visit (HOSPITAL_COMMUNITY)
Admission: RE | Admit: 2015-09-21 | Discharge: 2015-09-21 | Disposition: A | Discharge status: Home / Self Care | Payer: Self-pay | Source: Ambulatory Visit | Attending: Neurology | Admitting: Neurology

## 2015-09-21 DIAGNOSIS — G35 Multiple sclerosis: Secondary | ICD-10-CM | POA: Insufficient documentation

## 2015-09-21 MED ORDER — SODIUM CHLORIDE 0.9 % IV SOLN
1000.0000 mg | Freq: Every day | INTRAVENOUS | Status: DC
  Filled 2015-09-21: qty 8

## 2015-09-21 MED ORDER — SODIUM CHLORIDE 0.9 % IV SOLN
2.0000 mg/kg | Freq: Once | INTRAVENOUS | Status: DC
  Filled 2015-09-21: qty 1.84

## 2015-09-22 ENCOUNTER — Encounter (HOSPITAL_COMMUNITY)
Admission: RE | Admit: 2015-09-22 | Discharge: 2015-09-22 | Disposition: A | Discharge status: Home / Self Care | Payer: Self-pay | Source: Ambulatory Visit | Attending: Neurology | Admitting: Neurology

## 2015-09-22 ENCOUNTER — Other Ambulatory Visit (HOSPITAL_COMMUNITY): Payer: Self-pay | Admitting: *Deleted

## 2015-09-22 DIAGNOSIS — G35 Multiple sclerosis: Secondary | ICD-10-CM | POA: Insufficient documentation

## 2015-09-22 MED ORDER — METHYLPREDNISOLONE SODIUM SUCC 1000 MG IJ SOLR
1000.0000 mg | Freq: Every day | INTRAMUSCULAR | Status: DC
  Administered 2015-09-22: 1000 mg via INTRAVENOUS
  Filled 2015-09-22: qty 8

## 2015-09-23 ENCOUNTER — Ambulatory Visit (HOSPITAL_COMMUNITY)
Admission: RE | Admit: 2015-09-23 | Discharge: 2015-09-23 | Disposition: A | Discharge status: Home / Self Care | Payer: Self-pay | Source: Ambulatory Visit | Attending: Neurology | Admitting: Neurology

## 2015-09-23 DIAGNOSIS — G35 Multiple sclerosis: Secondary | ICD-10-CM | POA: Insufficient documentation

## 2015-09-23 MED ORDER — SODIUM CHLORIDE 0.9 % IV SOLN
1000.0000 mg | Freq: Every day | INTRAVENOUS | Status: DC
  Administered 2015-09-23: 1000 mg via INTRAVENOUS
  Filled 2015-09-23: qty 8

## 2015-09-29 ENCOUNTER — Other Ambulatory Visit (HOSPITAL_COMMUNITY): Payer: Self-pay | Admitting: Orthopaedic Surgery

## 2015-09-29 DIAGNOSIS — M545 Low back pain: Secondary | ICD-10-CM

## 2015-10-07 MED FILL — LOSARTAN-HCTZ 100-25 MG TAB: 100-25 | 30 days supply | Qty: 30 | Fill #2

## 2015-10-07 MED FILL — $Cymbalta 30mg capsule: 30 | 30 days supply | Qty: 30 | Fill #2

## 2015-10-07 MED FILL — ATENOLOL 50 MG TABLET: 50 | 30 days supply | Qty: 30 | Fill #1

## 2015-10-07 MED FILL — ?CYCLOBENZAPRINE 10 MG TAB: 10 MG | 30 days supply | Qty: 30 | Fill #2

## 2015-10-07 MED FILL — ?ALLOPURINOL 100 MG TABLET: 100 | 30 days supply | Qty: 30 | Fill #2

## 2015-10-15 ENCOUNTER — Ambulatory Visit (HOSPITAL_COMMUNITY): Admission: RE | Admit: 2015-10-15 | Payer: Self-pay | Source: Ambulatory Visit

## 2015-10-16 ENCOUNTER — Ambulatory Visit: Payer: Self-pay | Attending: Internal Medicine

## 2015-10-22 ENCOUNTER — Ambulatory Visit: Payer: Self-pay | Admitting: Neurology

## 2015-10-30 ENCOUNTER — Ambulatory Visit (INDEPENDENT_AMBULATORY_CARE_PROVIDER_SITE_OTHER): Payer: Self-pay | Admitting: Neurology

## 2015-10-30 ENCOUNTER — Encounter: Payer: Self-pay | Admitting: Neurology

## 2015-10-30 ENCOUNTER — Other Ambulatory Visit (INDEPENDENT_AMBULATORY_CARE_PROVIDER_SITE_OTHER): Payer: Self-pay

## 2015-10-30 VITALS — BP 120/80 | HR 73 | Wt 250.2 lb

## 2015-10-30 DIAGNOSIS — G35 Multiple sclerosis: Secondary | ICD-10-CM

## 2015-10-30 DIAGNOSIS — G5603 Carpal tunnel syndrome, bilateral upper limbs: Secondary | ICD-10-CM

## 2015-10-30 LAB — COMPREHENSIVE METABOLIC PANEL
ALT: 16 U/L (ref 0–35)
AST: 14 U/L (ref 0–37)
Albumin: 3.8 g/dL (ref 3.5–5.2)
Alkaline Phosphatase: 75 U/L (ref 39–117)
BILIRUBIN TOTAL: 0.9 mg/dL (ref 0.2–1.2)
BUN: 13 mg/dL (ref 6–23)
CALCIUM: 9.4 mg/dL (ref 8.4–10.5)
CO2: 29 meq/L (ref 19–32)
Chloride: 103 mEq/L (ref 96–112)
Creatinine, Ser: 1.37 mg/dL — ABNORMAL HIGH (ref 0.40–1.20)
GFR: 51.29 mL/min — AB (ref >60.00)
Glucose, Bld: 97 mg/dL (ref 70–99)
Potassium: 3.7 mEq/L (ref 3.5–5.1)
Sodium: 138 mEq/L (ref 135–145)
Total Protein: 7.4 g/dL (ref 6.0–8.3)

## 2015-10-30 LAB — CBC
HCT: 43.1 % (ref 36.0–46.0)
Hemoglobin: 14.4 g/dL (ref 12.0–15.0)
MCHC: 33.3 g/dL (ref 30.0–36.0)
MCV: 91.2 fl (ref 78.0–100.0)
PLATELETS: 238 10*3/uL (ref 150.0–400.0)
RBC: 4.73 Mil/uL (ref 3.87–5.11)
RDW: 14.8 % (ref 11.5–15.5)
WBC: 9.3 10*3/uL (ref 4.0–10.5)

## 2015-10-30 NOTE — Progress Notes (Signed)
Follow-up Visit   Date: 10/30/2015   Tiffany Brady MRN: [id number] DOB: Jul 03, 1961   Interim History: Tiffany Brady is a 55 y.o. right-handed African American female with chronic pain syndrome, fibromyaglia, hypertension, and tobacco use returning to the clinic for follow-up of left sided weakness and pain.  The patient was accompanied to the clinic by self.  History of present illness: Starting in July 2016, she began experiencing numbness/tingling over the tips of her fingers on both hands which has steadily involved the whole hand. Denies any exacerbating or alleviating factors. She often tries to shake her hands because they always feel asleep. She also has numbness over the right leg above the knees, but this is intermittent. She endorses pain related weakness and has difficulty with walking and doing daily activities such as bathing and cooking because of the pain.   She also has a lot of whole body pain described as soreness which is tender to palpation. She feels as if someone cut her and bleeding on the inside or as if someone is breaking her collar bone.  She was recently started on Cymbalta helps alleviate the pain where she can function, but it is still always there. Her ANA, RF, and TSH is normal.   UPDATE 08/03/2015:  Patient presented to her PCP on 10/31 with complaints of generalized weakness, gait difficulty, bowel and bladder incontinenced and concerned about MS due to her daughter diagnosed with this.  She underwent MRI brain which showed white matter changes involving the periventricular region and subcortical white matter, concerning for MS.  Although initial imaging also suggested cervical cord compression, subsequent imaging of her cervical spine only showed multilevel spinal stenosis without cord involvement.   She complains episodic left sided weakness and numbness occuring several times per day, lasting a few minutes. She reports having urge incontinence  for the past 15 year, which is triggered by sneezing, coughing, or laughing. She does not have a regular OBGYN provider. She complains of gait difficulty but denies walks and walks unassisted.  She has chronic pain involving her whole body.  UPDATE 09/04/2015:  She started gabapentin 341m BID but had not noticed any changes in her left arm.  She is not using wrist splints due to expense.  She underwent CSF testing which showed normal IgG index, but there was increased oligoclonal bands both in the CSF and serum, but moreso apparent in the CSF.  She denies any new complaints.  She is seeing ortho tomorrow for left knee buckling and pain.  UPDATE 10/30/2015:  Since treating her with 3-days of IVMP, she did not notice any difference in any of her symptoms.  She continues to complain of a lot of myalgias and generalized pain throughout (knees, hips, elbows).  She does not see pain management and states her PCP manages her fibromyalgia.     Medications:  Current Outpatient Prescriptions on File Prior to Visit  Medication Sig Dispense Refill  . allopurinol (ZYLOPRIM) 100 MG tablet Take 1 tablet (100 mg total) by mouth daily. 30 tablet 6  . atenolol (TENORMIN) 50 MG tablet TAKE 1 TABLET BY MOUTH DAILY 30 tablet 3  . cyclobenzaprine (FLEXERIL) 10 MG tablet Take 1 tablet (10 mg total) by mouth at bedtime. 30 tablet 3  . DULoxetine (CYMBALTA) 30 MG capsule Take 1 capsule (30 mg total) by mouth daily. 120 capsule 3  . gabapentin (NEURONTIN) 300 MG capsule Take 2 capsules (600 mg total) by mouth 3 (three) times daily. 180 capsule  5  . losartan-hydrochlorothiazide (HYZAAR) 100-25 MG tablet Take 1 tablet by mouth daily. 30 tablet 3  . Vitamin D, Ergocalciferol, (DRISDOL) 50000 UNITS CAPS capsule Take 1 capsule (50,000 Units total) by mouth every 7 (seven) days. Take 1 capsule by mouth every 7 days x 10 weeks 10 capsule 0   No current facility-administered medications on file prior to visit.    Allergies: No  Known Allergies  Review of Systems:  CONSTITUTIONAL: No fevers, chills, night sweats, or weight loss.  EYES: No visual changes or eye pain ENT: No hearing changes.  No history of nose bleeds.   RESPIRATORY: No cough, wheezing and shortness of breath.   CARDIOVASCULAR: Negative for chest pain, and palpitations.   GI: Negative for abdominal discomfort, blood in stools or black stools.  No recent change in bowel habits.   GU:  +history of incontinence.   MUSCLOSKELETAL: +history of joint pain or swelling.  +myalgias.   SKIN: Negative for lesions, rash, and itching.   ENDOCRINE: Negative for cold or heat intolerance, polydipsia or goiter.   PSYCH:  + depression or anxiety symptoms.   NEURO: As Above.   Vital Signs:  BP 120/80 mmHg  Pulse 73  Wt 250 lb 4 oz (113.513 kg)  SpO2 95%  LMP 04/21/2014  Neurological Exam: MENTAL STATUS including orientation to time, place, person, recent and remote memory, attention span and concentration, language, and fund of knowledge is normal.  Speech is not dysarthric.  CRANIAL NERVES: No visual field defects. Pupils equal round and reactive to light.  Normal conjugate, extra-ocular eye movements in all directions of gaze.  No ptosis. Normal facial sensation.  Face is symmetric. Palate elevates symmetrically.  Tongue is midline.  MOTOR:  Motor strength is 5/5  in all extremities, after repeated effort and encouragement.  No atrophy, fasciculations or abnormal movements.  No pronator drift.  Tone is normal.    MSRs:  Reflexes are 2+/4 throughout  SENSORY:  Intact to vibration and temperature throughout.  COORDINATION/GAIT:  Normal finger-to- nose-finger and heel-to-shin.  Slowed finger tapping and movement throughout.  Gait is wide-based, slow.  Data: MRI cervical spine wwo contrast 07/31/2015: Multilevel degenerative change in the cervical spine with mild spinal stenosis at C3-4, C4-5, C5-6. Left foraminal narrowing at C5-6 due to disc and  osteophyte  Spinal cord appears normal without cord lesion or abnormal enhancement. No cord compression.  MRI brain wwo contrast 07/24/2015: Extensive and premature for age white matter signal abnormality strongly suspicious for multiple sclerosis. Chronic microvascular ischemic changes are not excluded, but are less favored.  No restricted diffusion or abnormal postcontrast enhancement at any location, but specifically in association with the white matter, to suggest an acute process.  Cervical spondylosis with suspected central disc protrusion/extrusion at C4-5 resulting in cord compression. This could contribute to numbness and pain in the extremities. Correlate clinically for radiculopathy or myelopathy. MRI cervical spine may be helpful in further evaluation as clinically indicated.  EMG of the upper extremities 05/26/2015: 1. Bilateral median neuropathy at or distal to the wrist, consistent with clinical diagnosis of carpal tunnel syndrome. Overall, these findings are mild in degree electrically and worse on the right. 2. There is no evidence of a cervical radiculopathy or diffuse myopathy affecting upper extremities.  Lab Results  Component Value Date   TSH 2.018 01/27/2014   Labs 09/29/2014: RF 12, ANA neg, ESR 23  CSF testing 08/14/2015:  R34 W0 P37 G67, IgG index 2.8, MPB < 2.0, OCB  >  5 well defined gamma restriction bands that are also present in the  patient's corresponding serum sample, but some bands in the CSF are more prominent  IMPRESSION/PLAN: 1.  ?Multiple sclerosis based on white matter changes on MRI brain and increased oligoclonal bands in the CSF.  IgG index was normal.  She does not have prior imaging for comparison, but based on the distribution of her white matter changes, this is most likely MS.  She did NOT have any improvement with 3-days of solumedrol suggesting that her pain is not related to MS.  We discussed either clinically following her until her next  surveillance imaging or starting immunomodulatory therapy.  She would like to be on disease modifying therapy.   We discussed medications available and decided to start authorization for Tecfidera.  Baseline CBC and CMP will be checked.   Continue vitamin D supplements  2.  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  -  Continue gabapentin to 319m TID   3.  Bilateral carpal tunnel syndrome, noncompliant with wrist splints  4.  Vitamin D deficiency, continue vitamin D 50000 units weekly  5.  Chronic history of urge incontinence  6.  Fibromyalgia and chronic pain syndrome, followed by PCP  Return to clinic in 4 months    The duration of this appointment visit was 30 minutes of face-to-face time with the patient.  Greater than 50% of this time was spent in counseling, explanation of diagnosis, planning of further management, and coordination of care.   Thank you for allowing me to participate in patient's care.  If I can answer any additional questions, I would be pleased to do so.    Sincerely,    Dre Gamino K. PPosey Pronto DO

## 2015-10-30 NOTE — Patient Instructions (Signed)
1.  We will start authorization for Tecfidera 2.  Check blood work 3.  Return to clinic in 4 months

## 2015-11-02 ENCOUNTER — Ambulatory Visit: Payer: Self-pay | Attending: Internal Medicine | Admitting: Internal Medicine

## 2015-11-02 ENCOUNTER — Encounter: Payer: Self-pay | Admitting: Internal Medicine

## 2015-11-02 VITALS — BP 122/80 | HR 68 | Temp 98.0°F | Resp 16 | Ht 64.0 in | Wt 251.0 lb

## 2015-11-02 DIAGNOSIS — G8929 Other chronic pain: Secondary | ICD-10-CM | POA: Insufficient documentation

## 2015-11-02 DIAGNOSIS — M797 Fibromyalgia: Secondary | ICD-10-CM | POA: Insufficient documentation

## 2015-11-02 DIAGNOSIS — M109 Gout, unspecified: Secondary | ICD-10-CM

## 2015-11-02 DIAGNOSIS — Z79899 Other long term (current) drug therapy: Secondary | ICD-10-CM | POA: Insufficient documentation

## 2015-11-02 DIAGNOSIS — I10 Essential (primary) hypertension: Secondary | ICD-10-CM | POA: Insufficient documentation

## 2015-11-02 DIAGNOSIS — G35 Multiple sclerosis: Secondary | ICD-10-CM | POA: Insufficient documentation

## 2015-11-02 DIAGNOSIS — G894 Chronic pain syndrome: Secondary | ICD-10-CM | POA: Insufficient documentation

## 2015-11-02 MED ORDER — CYCLOBENZAPRINE HCL 10 MG PO TABS: 10.0000 mg | ORAL_TABLET | Freq: Two times a day (BID) | ORAL | Status: DC | PRN

## 2015-11-02 MED ORDER — ALLOPURINOL 100 MG PO TABS: 100.0000 mg | ORAL_TABLET | Freq: Every day | ORAL | Status: DC

## 2015-11-02 MED ORDER — ATENOLOL 50 MG PO TABS: 50.0000 mg | ORAL_TABLET | Freq: Every day | ORAL | Status: DC

## 2015-11-02 MED ORDER — LOSARTAN POTASSIUM-HCTZ 100-25 MG PO TABS: 1.0000 | ORAL_TABLET | Freq: Every day | ORAL | Status: DC

## 2015-11-02 MED FILL — CYCLOBENZAPRINE 10 MG TAB: 10 | 30 days supply | Qty: 60 | Fill #0

## 2015-11-02 MED FILL — LOSARTAN-HCTZ 100-25 MG TAB: 100-25 | 30 days supply | Qty: 30 | Fill #0

## 2015-11-02 MED FILL — ATENOLOL 50 MG TABLET: 50 | 30 days supply | Qty: 30 | Fill #0

## 2015-11-02 MED FILL — ALLOPURINOL 100 MG TABLET: 100 | 30 days supply | Qty: 30 | Fill #0

## 2015-11-02 NOTE — Progress Notes (Signed)
Patient ID: Tiffany Brady, female   DOB: Aug 11, 1961, 55 y.o.   MRN: [id number] Subjective:  Tiffany Brady is a 55 y.o. female with hypertension, tobacco use, fibromyalgia, and multiple sclerosis. Patient reports that her neurologist recently placed her on Tecfidera for better pain management. Patient currently takes Cymbalta and Gabapentin as well for chronic pain. Patient is requesting medication refills today.  Current Outpatient Prescriptions  Medication Sig Dispense Refill  . allopurinol (ZYLOPRIM) 100 MG tablet Take 1 tablet (100 mg total) by mouth daily. 30 tablet 6  . atenolol (TENORMIN) 50 MG tablet TAKE 1 TABLET BY MOUTH DAILY 30 tablet 3  . cyclobenzaprine (FLEXERIL) 10 MG tablet Take 1 tablet (10 mg total) by mouth at bedtime. 30 tablet 3  . Dimethyl Fumarate (TECFIDERA PO) Take by mouth.    . DULoxetine (CYMBALTA) 30 MG capsule Take 1 capsule (30 mg total) by mouth daily. 120 capsule 3  . gabapentin (NEURONTIN) 300 MG capsule Take 2 capsules (600 mg total) by mouth 3 (three) times daily. 180 capsule 5  . losartan-hydrochlorothiazide (HYZAAR) 100-25 MG tablet Take 1 tablet by mouth daily. 30 tablet 3  . Vitamin D, Ergocalciferol, (DRISDOL) 50000 UNITS CAPS capsule Take 1 capsule (50,000 Units total) by mouth every 7 (seven) days. Take 1 capsule by mouth every 7 days x 10 weeks 10 capsule 0   No current facility-administered medications for this visit.    ROS: taking medications as instructed, no medication side effects noted, no TIA's, no chest pain on exertion, no dyspnea on exertion, no swelling of ankles and no palpitations. All other systems negative   Objective:  BP 122/80 mmHg  Pulse 68  Temp(Src) 98 F (36.7 C)  Resp 16  Ht 5\' 4"  (1.626 m)  Wt 251 lb (113.853 kg)  BMI 43.06 kg/m2  SpO2 100%  LMP 04/21/2014  Appearance alert, well appearing, and in no distress, oriented to person, place, and time and overweight. General exam BP noted to be well controlled today in  office, S1, S2 normal, no gallop, no murmur, chest clear, no JVD, no HSM, no edema.  Lab review: orders written for new lab studies as appropriate; see orders.   Assessment:   Tiffany Brady was seen today for follow-up.  Diagnoses and all orders for this visit:  Multiple sclerosis ([place]) Continue Tecfidera and follow up with Neurology.   Essential hypertension -     Lipid panel -     losartan-hydrochlorothiazide (HYZAAR) 100-25 MG tablet; Take 1 tablet by mouth daily. -     atenolol (TENORMIN) 50 MG tablet; Take 1 tablet (50 mg total) by mouth daily. Patient blood pressure is stable and may continue on current medication.  Education on diet, exercise, and modifiable risk factors discussed. Will obtain appropriate labs as needed. Will follow up in 3-6 months.   Gout without tophus, unspecified cause, unspecified chronicity, unspecified site -     allopurinol (ZYLOPRIM) 100 MG tablet; Take 1 tablet (100 mg total) by mouth daily. Stable, meds refilled  Chronic pain syndrome -    Refilled cyclobenzaprine (FLEXERIL) 10 MG tablet; Take 1 tablet (10 mg total) by mouth 2 (two) times daily as needed for muscle spasms.   Plan:  Orders and follow up as documented in patient record. Reviewed diet, exercise and weight control. Recommended sodium restriction. Copy of written low fat low cholesterol diet provided and reviewed.   Return in about 3 months (around 01/30/2016) for Hypertension.  Lance Bosch, NP 11/17/2015 12:07 PM

## 2015-11-02 NOTE — Progress Notes (Signed)
Patient here for follow up on her HTN and for her Regular check up Patient will be starting a new medication called Tecfidera for her MS

## 2015-11-03 LAB — LIPID PANEL
CHOL/HDL RATIO: 3.2 ratio (ref <=5.0)
Cholesterol: 146 mg/dL (ref 125–200)
HDL: 46 mg/dL (ref >=46)
LDL Cholesterol: 88 mg/dL (ref <130)
TRIGLYCERIDES: 59 mg/dL (ref <150)
VLDL: 12 mg/dL (ref <30)

## 2015-11-09 ENCOUNTER — Telehealth: Payer: Self-pay

## 2015-11-09 ENCOUNTER — Telehealth: Payer: Self-pay | Admitting: Internal Medicine

## 2015-11-09 NOTE — Telephone Encounter (Signed)
Pt. Returned call. Please f/u with pt. °

## 2015-11-09 NOTE — Telephone Encounter (Signed)
Called patient this am Patient not available Message left on voice mail to return our call 

## 2015-11-09 NOTE — Telephone Encounter (Signed)
-----   Message from Lance Bosch, NP sent at 11/08/2015  5:25 PM EST ----- Cholesterol looks great

## 2015-11-09 NOTE — Telephone Encounter (Signed)
Returned patient phone  Call and she is aware of her Lab results

## 2015-11-11 MED FILL — $Cymbalta 30mg capsule: 30 | 30 days supply | Qty: 30 | Fill #3

## 2015-11-18 ENCOUNTER — Ambulatory Visit: Payer: Self-pay | Admitting: Neurology

## 2015-12-04 ENCOUNTER — Encounter (HOSPITAL_COMMUNITY): Payer: Self-pay | Admitting: *Deleted

## 2015-12-04 ENCOUNTER — Emergency Department (HOSPITAL_COMMUNITY)
Admission: EM | Admit: 2015-12-04 | Discharge: 2015-12-04 | Disposition: A | Discharge status: Home / Self Care | Payer: Self-pay | Attending: Emergency Medicine | Admitting: Emergency Medicine

## 2015-12-04 ENCOUNTER — Other Ambulatory Visit: Payer: Self-pay

## 2015-12-04 ENCOUNTER — Emergency Department (HOSPITAL_COMMUNITY): Payer: Self-pay

## 2015-12-04 ENCOUNTER — Encounter (HOSPITAL_COMMUNITY): Payer: Self-pay | Admitting: Emergency Medicine

## 2015-12-04 ENCOUNTER — Emergency Department (INDEPENDENT_AMBULATORY_CARE_PROVIDER_SITE_OTHER)
Admission: EM | Admit: 2015-12-04 | Discharge: 2015-12-04 | Disposition: A | Discharge status: Other Acute Inpatient Hospital | Payer: Self-pay | Source: Home / Self Care | Attending: Family Medicine | Admitting: Family Medicine

## 2015-12-04 DIAGNOSIS — J441 Chronic obstructive pulmonary disease with (acute) exacerbation: Secondary | ICD-10-CM

## 2015-12-04 DIAGNOSIS — F17210 Nicotine dependence, cigarettes, uncomplicated: Secondary | ICD-10-CM | POA: Insufficient documentation

## 2015-12-04 DIAGNOSIS — R000 Tachycardia, unspecified: Secondary | ICD-10-CM | POA: Insufficient documentation

## 2015-12-04 DIAGNOSIS — G894 Chronic pain syndrome: Secondary | ICD-10-CM | POA: Insufficient documentation

## 2015-12-04 DIAGNOSIS — Z79899 Other long term (current) drug therapy: Secondary | ICD-10-CM | POA: Insufficient documentation

## 2015-12-04 DIAGNOSIS — M109 Gout, unspecified: Secondary | ICD-10-CM | POA: Insufficient documentation

## 2015-12-04 DIAGNOSIS — J189 Pneumonia, unspecified organism: Secondary | ICD-10-CM

## 2015-12-04 DIAGNOSIS — G35 Multiple sclerosis: Secondary | ICD-10-CM | POA: Insufficient documentation

## 2015-12-04 DIAGNOSIS — I10 Essential (primary) hypertension: Secondary | ICD-10-CM | POA: Insufficient documentation

## 2015-12-04 DIAGNOSIS — R062 Wheezing: Secondary | ICD-10-CM

## 2015-12-04 DIAGNOSIS — F17200 Nicotine dependence, unspecified, uncomplicated: Secondary | ICD-10-CM

## 2015-12-04 DIAGNOSIS — J159 Unspecified bacterial pneumonia: Secondary | ICD-10-CM | POA: Insufficient documentation

## 2015-12-04 HISTORY — DX: Multiple sclerosis: G35

## 2015-12-04 HISTORY — DX: Gout, unspecified: M10.9

## 2015-12-04 LAB — BASIC METABOLIC PANEL
Anion gap: 13 (ref 5–15)
BUN: 20 mg/dL (ref 6–20)
CHLORIDE: 98 mmol/L — AB (ref 101–111)
CO2: 20 mmol/L — ABNORMAL LOW (ref 22–32)
CREATININE: 1.77 mg/dL — AB (ref 0.44–1.00)
Calcium: 9.1 mg/dL (ref 8.9–10.3)
GFR calc Af Amer: 36 mL/min — ABNORMAL LOW (ref >60)
GFR calc non Af Amer: 31 mL/min — ABNORMAL LOW (ref >60)
GLUCOSE: 150 mg/dL — AB (ref 65–99)
POTASSIUM: 3.6 mmol/L (ref 3.5–5.1)
SODIUM: 131 mmol/L — AB (ref 135–145)

## 2015-12-04 LAB — I-STAT TROPONIN, ED: TROPONIN I, POC: 0.01 ng/mL (ref 0.00–0.08)

## 2015-12-04 LAB — CBC
HEMATOCRIT: 45.4 % (ref 36.0–46.0)
HEMOGLOBIN: 15.3 g/dL — AB (ref 12.0–15.0)
MCH: 30.5 pg (ref 26.0–34.0)
MCHC: 33.7 g/dL (ref 30.0–36.0)
MCV: 90.4 fL (ref 78.0–100.0)
Platelets: 172 10*3/uL (ref 150–400)
RBC: 5.02 MIL/uL (ref 3.87–5.11)
RDW: 14 % (ref 11.5–15.5)
WBC: 11.2 10*3/uL — ABNORMAL HIGH (ref 4.0–10.5)

## 2015-12-04 MED ORDER — IPRATROPIUM BROMIDE 0.02 % IN SOLN
0.5000 mg | Freq: Once | RESPIRATORY_TRACT | Status: AC
  Administered 2015-12-04: 0.5 mg via RESPIRATORY_TRACT
  Filled 2015-12-04: qty 2.5

## 2015-12-04 MED ORDER — METHYLPREDNISOLONE SODIUM SUCC 125 MG IJ SOLR
INTRAMUSCULAR | Status: AC
  Filled 2015-12-04: qty 2

## 2015-12-04 MED ORDER — ALBUTEROL SULFATE (2.5 MG/3ML) 0.083% IN NEBU
INHALATION_SOLUTION | RESPIRATORY_TRACT | Status: AC
  Filled 2015-12-04: qty 6

## 2015-12-04 MED ORDER — ALBUTEROL SULFATE HFA 108 (90 BASE) MCG/ACT IN AERS
2.0000 | INHALATION_SPRAY | RESPIRATORY_TRACT | Status: DC | PRN
  Administered 2015-12-04: 2 via RESPIRATORY_TRACT
  Filled 2015-12-04: qty 6.7

## 2015-12-04 MED ORDER — ALBUTEROL SULFATE (2.5 MG/3ML) 0.083% IN NEBU
5.0000 mg | INHALATION_SOLUTION | Freq: Once | RESPIRATORY_TRACT | Status: AC
  Administered 2015-12-04: 5 mg via RESPIRATORY_TRACT

## 2015-12-04 MED ORDER — AZITHROMYCIN 250 MG PO TABS: 250.0000 mg | ORAL_TABLET | Freq: Every day | ORAL | Status: DC

## 2015-12-04 MED ORDER — BENZONATATE 100 MG PO CAPS: 100.0000 mg | ORAL_CAPSULE | Freq: Three times a day (TID) | ORAL | Status: DC

## 2015-12-04 MED ORDER — HYDROCODONE-HOMATROPINE 5-1.5 MG/5ML PO SYRP
5.0000 mL | ORAL_SOLUTION | Freq: Once | ORAL | Status: AC
  Administered 2015-12-04: 5 mL via ORAL
  Filled 2015-12-04: qty 5

## 2015-12-04 MED ORDER — IPRATROPIUM BROMIDE 0.02 % IN SOLN
0.5000 mg | Freq: Once | RESPIRATORY_TRACT | Status: AC
  Administered 2015-12-04: 0.5 mg via RESPIRATORY_TRACT

## 2015-12-04 MED ORDER — ALBUTEROL SULFATE (2.5 MG/3ML) 0.083% IN NEBU
5.0000 mg | INHALATION_SOLUTION | Freq: Once | RESPIRATORY_TRACT | Status: AC
  Administered 2015-12-04: 5 mg via RESPIRATORY_TRACT
  Filled 2015-12-04: qty 6

## 2015-12-04 MED ORDER — PREDNISONE 20 MG PO TABS: 40.0000 mg | ORAL_TABLET | Freq: Every day | ORAL | Status: DC

## 2015-12-04 MED ORDER — AEROCHAMBER PLUS W/MASK MISC: 1.0000 | Freq: Once | Status: DC

## 2015-12-04 MED ORDER — IPRATROPIUM BROMIDE 0.02 % IN SOLN
RESPIRATORY_TRACT | Status: AC
  Filled 2015-12-04: qty 2.5

## 2015-12-04 NOTE — ED Notes (Signed)
Care  Link  Called     No  Truck  Available

## 2015-12-04 NOTE — ED Notes (Signed)
Patient transported to X-ray 

## 2015-12-04 NOTE — Discharge Instructions (Signed)
1. Medications: albuterol, prednisone, azithromycin, tessalon, usual home medications 2. Treatment: rest, drink plenty of fluids, begin OTC antihistamine (Zyrtec or Claritin)  3. Follow Up: Please followup with your primary doctor in 2-3 days for discussion of your diagnoses and further evaluation after today's visit; if you do not have a primary care doctor use the resource guide provided to find one; Please return to the ER for difficulty breathing, high fevers or worsening symptoms.    Community-Acquired Pneumonia, Adult Pneumonia is an infection of the lungs. There are different types of pneumonia. One type can develop while a person is in a hospital. A different type, called community-acquired pneumonia, develops in people who are not, or have not recently been, in the hospital or other health care facility.  CAUSES Pneumonia may be caused by bacteria, viruses, or funguses. Community-acquired pneumonia is often caused by Streptococcus pneumonia bacteria. These bacteria are often passed from one person to another by breathing in droplets from the cough or sneeze of an infected person. RISK FACTORS The condition is more likely to develop in:  People who havechronic diseases, such as chronic obstructive pulmonary disease (COPD), asthma, congestive heart failure, cystic fibrosis, diabetes, or kidney disease.  People who haveearly-stage or late-stage HIV.  People who havesickle cell disease.  People who havehad their spleen removed (splenectomy).  People who havepoor Human resources officer.  People who havemedical conditions that increase the risk of breathing in (aspirating) secretions their own mouth and nose.   People who havea weakened immune system (immunocompromised).  People who smoke.  People whotravel to areas where pneumonia-causing germs commonly exist.  People whoare around animal habitats or animals that have pneumonia-causing germs, including birds, bats, rabbits, cats,  and farm animals. SYMPTOMS Symptoms of this condition include:  Adry cough.  A wet (productive) cough.  Fever.  Sweating.  Chest pain, especially when breathing deeply or coughing.  Rapid breathing or difficulty breathing.  Shortness of breath.  Shaking chills.  Fatigue.  Muscle aches. DIAGNOSIS Your health care provider will take a medical history and perform a physical exam. You may also have other tests, including:  Imaging studies of your chest, including X-rays.  Tests to check your blood oxygen level and other blood gases.  Other tests on blood, mucus (sputum), fluid around your lungs (pleural fluid), and urine. If your pneumonia is severe, other tests may be done to identify the specific cause of your illness. TREATMENT The type of treatment that you receive depends on many factors, such as the cause of your pneumonia, the medicines you take, and other medical conditions that you have. For most adults, treatment and recovery from pneumonia may occur at home. In some cases, treatment must happen in a hospital. Treatment may include:  Antibiotic medicines, if the pneumonia was caused by bacteria.  Antiviral medicines, if the pneumonia was caused by a virus.  Medicines that are given by mouth or through an IV tube.  Oxygen.  Respiratory therapy. Although rare, treating severe pneumonia may include:  Mechanical ventilation. This is done if you are not breathing well on your own and you cannot maintain a safe blood oxygen level.  Thoracentesis. This procedureremoves fluid around one lung or both lungs to help you breathe better. HOME CARE INSTRUCTIONS  Take over-the-counter and prescription medicines only as told by your health care provider.  Only takecough medicine if you are losing sleep. Understand that cough medicine can prevent your body's natural ability to remove mucus from your lungs.  If you  were prescribed an antibiotic medicine, take it as told  by your health care provider. Do not stop taking the antibiotic even if you start to feel better.  Sleep in a semi-upright position at night. Try sleeping in a reclining chair, or place a few pillows under your head.  Do not use tobacco products, including cigarettes, chewing tobacco, and e-cigarettes. If you need help quitting, ask your health care provider.  Drink enough water to keep your urine clear or pale yellow. This will help to thin out mucus secretions in your lungs. PREVENTION There are ways that you can decrease your risk of developing community-acquired pneumonia. Consider getting a pneumococcal vaccine if:  You are older than 56 years of age.  You are older than 56 years of age and are undergoing cancer treatment, have chronic lung disease, or have other medical conditions that affect your immune system. Ask your health care provider if this applies to you. There are different types and schedules of pneumococcal vaccines. Ask your health care provider which vaccination option is best for you. You may also prevent community-acquired pneumonia if you take these actions:  Get an influenza vaccine every year. Ask your health care provider which type of influenza vaccine is best for you.  Go to the dentist on a regular basis.  Wash your hands often. Use hand sanitizer if soap and water are not available. SEEK MEDICAL CARE IF:  You have a fever.  You are losing sleep because you cannot control your cough with cough medicine. SEEK IMMEDIATE MEDICAL CARE IF:  You have worsening shortness of breath.  You have increased chest pain.  Your sickness becomes worse, especially if you are an older adult or have a weakened immune system.  You cough up blood.   This information is not intended to replace advice given to you by your health care provider. Make sure you discuss any questions you have with your health care provider.   Document Released: 08/29/2005 Document Revised:  05/20/2015 Document Reviewed: 12/24/2014 Elsevier Interactive Patient Education [employer].

## 2015-12-04 NOTE — ED Notes (Signed)
Pt transfer from [facility] for SOB and chest tightness.  Pt reports flu like symptoms with productive cough of yellow sputum x1 week.  Pt reports she has had central chest tightness with SOB x2 days.  Pt given 2 breathing tx, 325mg  ASA, 125mg  Solu-medrol and 1 nitro PTA.  Pt is in NAD at this time and is A&Ox4.

## 2015-12-04 NOTE — ED Notes (Signed)
Pt  Reports  Symptoms  Of  Chest  Pain  And  Shortness  Of  Breath     Pt  Is  A  Smoker        She  Reports  Cough   And  Congestion      Symptoms  For  Several    Days  Getting  Worse

## 2015-12-04 NOTE — ED Notes (Signed)
Iv  22  Angio  r  Hand 2  nd  attempt

## 2015-12-04 NOTE — ED Notes (Signed)
Patient gone to xray 

## 2015-12-04 NOTE — ED Notes (Signed)
Patient ambulated in the hallway with this RN as supervision. Able to ambulate approximately 225ft unassisted, no distress noted, 02 sat up to 94% on room air, pulse 108. Returned to room to prepare for discharge after discussing with hannah, pa-c.

## 2015-12-04 NOTE — ED Provider Notes (Signed)
CSN: [id number]     Arrival date & time 12/04/15  1823 History   First MD Initiated Contact with Patient 12/04/15 1902     Chief Complaint  Patient presents with  . Shortness of Breath  . Chest Pain     (Consider location/radiation/quality/duration/timing/severity/associated sxs/prior Treatment) The history is provided by the patient and medical records. No language interpreter was used.   Tiffany Brady is a 55 y.o. female  with a hx of retention, chronic pain, multiple sclerosis, gout presents to the Emergency Department complaining of gradual, persistent, progressively worsening URI symptoms onset 5 days ago. Patient reports associated shortness of breath, chest tightness, cough productive of yellow sputum, myalgias and subjective fevers at home. She was initially seen at the urgent care center and transferred here. She was given 2 breathing treatments, aspirin, Solu-Medrol, nitroglycerin. She denies history of cardiac disease.  She reports she is a smoker, smoking approximately 1 pack per day.  She denies headache, neck pain, neck stiffness, abdominal pain, nausea, vomiting, diarrhea, weakness, dizziness, syncope.  Patient denies recent travel or hospitalizations.  Past Medical History  Diagnosis Date  . Hypertension   . Chronic pain syndrome   . Tobacco use   . MS (multiple sclerosis) ([place])   . Gout    Past Surgical History  Procedure Laterality Date  . Foot surgery Right    Family History  Problem Relation Age of Onset  . Cancer Mother     Deceased  . Hypertension Mother   . HIV/AIDS Brother     Deceased  . Multiple sclerosis Daughter   . Other Father     Deceased, 23   Social History  Substance Use Topics  . Smoking status: Current Every Day Smoker -- 0.75 packs/day for 40 years    Types: Cigarettes  . Smokeless tobacco: Never Used  . Alcohol Use: 0.0 oz/week    0 Standard drinks or equivalent per week     Comment: 1 pint of liquor lasts a month   OB History    No data available     Review of Systems  Constitutional: Positive for fever. Negative for diaphoresis, appetite change, fatigue and unexpected weight change.  HENT: Positive for rhinorrhea. Negative for mouth sores.   Eyes: Negative for visual disturbance.  Respiratory: Positive for cough, chest tightness, shortness of breath and wheezing.   Cardiovascular: Negative for chest pain.  Gastrointestinal: Negative for nausea, vomiting, abdominal pain, diarrhea and constipation.  Endocrine: Negative for polydipsia, polyphagia and polyuria.  Genitourinary: Negative for dysuria, urgency, frequency and hematuria.  Musculoskeletal: Negative for back pain and neck stiffness.  Skin: Negative for rash.  Allergic/Immunologic: Negative for immunocompromised state.  Neurological: Negative for syncope, light-headedness and headaches.  Hematological: Does not bruise/bleed easily.  Psychiatric/Behavioral: Negative for sleep disturbance. The patient is not nervous/anxious.       Allergies  Review of patient's allergies indicates no known allergies.  Home Medications   Prior to Admission medications   Medication Sig Start Date End Date Taking? Authorizing Provider  allopurinol (ZYLOPRIM) 100 MG tablet Take 1 tablet (100 mg total) by mouth daily. 11/02/15  Yes Lance Bosch, NP  atenolol (TENORMIN) 50 MG tablet Take 1 tablet (50 mg total) by mouth daily. 11/02/15  Yes Lance Bosch, NP  cyclobenzaprine (FLEXERIL) 10 MG tablet Take 1 tablet (10 mg total) by mouth 2 (two) times daily as needed for muscle spasms. Patient taking differently: Take 10 mg by mouth 2 (two) times daily.  11/02/15  Yes Lance Bosch, NP  DULoxetine (CYMBALTA) 30 MG capsule Take 1 capsule (30 mg total) by mouth daily. 09/09/15  Yes Tresa Garter, MD  gabapentin (NEURONTIN) 300 MG capsule Take 2 capsules (600 mg total) by mouth 3 (three) times daily. 09/04/15  Yes Donika K Patel, DO  losartan-hydrochlorothiazide (HYZAAR)  100-25 MG tablet Take 1 tablet by mouth daily. 11/02/15  Yes Lance Bosch, NP  Vitamin D, Ergocalciferol, (DRISDOL) 50000 UNITS CAPS capsule Take 1 capsule (50,000 Units total) by mouth every 7 (seven) days. Take 1 capsule by mouth every 7 days x 10 weeks Patient taking differently: Take 50,000 Units by mouth every Saturday. Take 1 capsule by mouth every 7 days x 10 weeks 08/03/15  Yes Donika K Patel, DO  azithromycin (ZITHROMAX) 250 MG tablet Take 1 tablet (250 mg total) by mouth daily. Take first 2 tablets together, then 1 every day until finished. 12/04/15   Isra Lindy, PA-C  benzonatate (TESSALON) 100 MG capsule Take 1 capsule (100 mg total) by mouth every 8 (eight) hours. 12/04/15   Alakai Macbride, PA-C  Dimethyl Fumarate 120 & 240 MG MISC Take by mouth.    Historical Provider, MD  predniSONE (DELTASONE) 20 MG tablet Take 2 tablets (40 mg total) by mouth daily. 12/04/15   Ladawn Boullion, PA-C   BP 126/65 mmHg  Pulse 104  Temp(Src) 98.5 F (36.9 C) (Oral)  Resp 20  SpO2 100%  LMP 04/21/2014 Physical Exam  Constitutional: She is oriented to person, place, and time. She appears well-developed and well-nourished. No distress.  Awake, alert, nontoxic appearance  HENT:  Head: Normocephalic and atraumatic.  Right Ear: Tympanic membrane, external ear and ear canal normal.  Left Ear: Tympanic membrane, external ear and ear canal normal.  Nose: Mucosal edema and rhinorrhea present. No epistaxis. Right sinus exhibits no maxillary sinus tenderness and no frontal sinus tenderness. Left sinus exhibits no maxillary sinus tenderness and no frontal sinus tenderness.  Mouth/Throat: Uvula is midline, oropharynx is clear and moist and mucous membranes are normal. Mucous membranes are not pale and not cyanotic. No oropharyngeal exudate, posterior oropharyngeal edema, posterior oropharyngeal erythema or tonsillar abscesses.  Eyes: Conjunctivae are normal. Pupils are equal, round, and reactive  to light. No scleral icterus.  Neck: Normal range of motion and full passive range of motion without pain. Neck supple.  Cardiovascular: Regular rhythm, normal heart sounds and intact distal pulses.  Tachycardia present.   Pulses:      Radial pulses are 2+ on the right side, and 2+ on the left side.  Pulmonary/Chest: Effort normal. No stridor. Tachypnea noted. No respiratory distress. She has decreased breath sounds. She has wheezes. She has rhonchi.  Decreased breath sounds throughout with wheezes and rhonchi noted in all lung fields Patient with mild tachypnea on exertion No hypoxia on room air  Abdominal: Soft. Bowel sounds are normal. She exhibits no mass. There is no tenderness. There is no rebound and no guarding.  Musculoskeletal: Normal range of motion. She exhibits no edema.  Lymphadenopathy:    She has no cervical adenopathy.  Neurological: She is alert and oriented to person, place, and time.  Speech is clear and goal oriented Moves extremities without ataxia  Skin: Skin is warm and dry. No rash noted. She is not diaphoretic.  Psychiatric: She has a normal mood and affect.  Nursing note and vitals reviewed.   ED Course  Procedures (including critical care time) Labs Review Labs Reviewed  BASIC METABOLIC PANEL -  Abnormal; Notable for the following:    Sodium 131 (*)    Chloride 98 (*)    CO2 20 (*)    Glucose, Bld 150 (*)    Creatinine, Ser 1.77 (*)    GFR calc non Af Amer 31 (*)    GFR calc Af Amer 36 (*)    All other components within normal limits  CBC - Abnormal; Notable for the following:    WBC 11.2 (*)    Hemoglobin 15.3 (*)    All other components within normal limits  I-STAT TROPOININ, ED    Imaging Review Dg Chest 2 View  12/04/2015  CLINICAL DATA:  Shortness of breath and cough with chest tightness for 5 days. EXAM: CHEST  2 VIEW COMPARISON:  None. FINDINGS: Normal cardiomediastinal silhouette. No pleural effusion or pneumothorax. Increased perihilar  markings suggesting viral pneumonitis. These extend into the LEFT lower lobe and early lobar consolidation cannot be excluded. No osseous findings. IMPRESSION: Increased perihilar markings suggesting viral pneumonitis. These are asymmetrically prominent in the LEFT lower lobe region and early pneumonia not excluded. Worsening aeration from priors. Electronically Signed   By: Staci Righter M.D.   On: 12/04/2015 19:52   I have personally reviewed and evaluated these images and lab results as part of my medical decision-making.   EKG Interpretation   Date/Time:  Friday December 04 2015 18:32:34 EDT Ventricular Rate:  114 PR Interval:  142 QRS Duration: 86 QT Interval:  375 QTC Calculation: 516 R Axis:   51 Text Interpretation:  Sinus tachycardia Biatrial enlargement Prolonged QT  interval No significant change since last tracing Confirmed by Alvino Chapel   MD, Ovid Curd [phone]) on 12/04/2015 10:46:04 PM      MDM   Final diagnoses:  CAP (community acquired pneumonia)  Smoking  Wheezing   Phenix Gagel presents with hypoxia, shortness of breath and cough.  On arrival patient oxygen saturation 90% on room air. She was placed on a nasal cannula with 2 L and adequate oxygen saturation.  She arrives from an urgent care and was given 2 breathing treatments, aspirin, Solu-Medrol and nitroglycerin prior to arrival. She does have central chest pain which is worse with coughing and deep inspiration. EKG was sinus tachycardia. Troponin negative. Highly doubt ACS. Patient's tachycardia less likely to be from PES she has had numerous breathing treatments prior to arrival. She has no history of DVT or risk factors for DVT.  Patient given additional breathing treatment. Her chest x-ray shows questionable pneumonia and given lung exam and symptoms we will treat for same.  Elevated serum creatinine noted. Discussed with patient he will follow with primary care for further evaluation. Due to elevated white blood cell  count and elevated hemoglobin patient is likely hemoconcentrated.  After multiple breathing treatments patient reports she is feeling much better breathing at baseline. She ambulates in the emergency department with oxygen saturations of 94%. Patient wishes for discharge home.  She was discharged with albuterol, prednisone and azithromycin. Strict return precautions given including worsening shortness of breath or other concerns.  Jarrett Soho Buffie Herne, PA-C 12/05/15 0113  Davonna Belling, MD 12/05/15 [phone]

## 2015-12-04 NOTE — ED Notes (Signed)
Iv  Ns    tko     22  Angio  r  Hand   1  Att   Cardiac  Monitor     Nasal  02  At  2  l  /  Min

## 2015-12-04 NOTE — ED Provider Notes (Addendum)
CSN: [id number]     Arrival date & time 12/04/15  1629 History   First MD Initiated Contact with Patient 12/04/15 1700     Chief Complaint  Patient presents with  . Shortness of Breath   (Consider location/radiation/quality/duration/timing/severity/associated sxs/prior Treatment) Patient is a 55 y.o. female presenting with shortness of breath. The history is provided by the patient.  Shortness of Breath Severity:  Moderate Onset quality:  Sudden Duration:  4 days Progression:  Worsening Chronicity:  Chronic Context: smoke exposure   Relieved by:  None tried Worsened by:  Nothing tried Associated symptoms: chest pain, cough, fever, PND and wheezing   Associated symptoms: no vomiting   Risk factors: obesity and tobacco use     Past Medical History  Diagnosis Date  . Hypertension   . Chronic pain syndrome   . Tobacco use    Past Surgical History  Procedure Laterality Date  . Foot surgery Right    Family History  Problem Relation Age of Onset  . Cancer Mother     Deceased  . Hypertension Mother   . HIV/AIDS Brother     Deceased  . Multiple sclerosis Daughter   . Other Father     Deceased, 39   Social History  Substance Use Topics  . Smoking status: Current Every Day Smoker -- 0.75 packs/day for 40 years    Types: Cigarettes  . Smokeless tobacco: Never Used  . Alcohol Use: 0.0 oz/week    0 Standard drinks or equivalent per week     Comment: 1 pint of liquor lasts a month   OB History    No data available     Review of Systems  Constitutional: Positive for fever.  Respiratory: Positive for cough, shortness of breath and wheezing.   Cardiovascular: Positive for chest pain and PND.  Gastrointestinal: Negative.  Negative for vomiting.  All other systems reviewed and are negative.   Allergies  Review of patient's allergies indicates no known allergies.  Home Medications   Prior to Admission medications   Medication Sig Start Date End Date Taking?  Authorizing Provider  allopurinol (ZYLOPRIM) 100 MG tablet Take 1 tablet (100 mg total) by mouth daily. 11/02/15   Lance Bosch, NP  atenolol (TENORMIN) 50 MG tablet Take 1 tablet (50 mg total) by mouth daily. 11/02/15   Lance Bosch, NP  cyclobenzaprine (FLEXERIL) 10 MG tablet Take 1 tablet (10 mg total) by mouth 2 (two) times daily as needed for muscle spasms. 11/02/15   Lance Bosch, NP  Dimethyl Fumarate (TECFIDERA PO) Take by mouth.    Historical Provider, MD  DULoxetine (CYMBALTA) 30 MG capsule Take 1 capsule (30 mg total) by mouth daily. 09/09/15   Tresa Garter, MD  gabapentin (NEURONTIN) 300 MG capsule Take 2 capsules (600 mg total) by mouth 3 (three) times daily. 09/04/15   Donika Keith Rake, DO  losartan-hydrochlorothiazide (HYZAAR) 100-25 MG tablet Take 1 tablet by mouth daily. 11/02/15   Lance Bosch, NP  Vitamin D, Ergocalciferol, (DRISDOL) 50000 UNITS CAPS capsule Take 1 capsule (50,000 Units total) by mouth every 7 (seven) days. Take 1 capsule by mouth every 7 days x 10 weeks 08/03/15   [name], DO   Meds Ordered and Administered this Visit   Medications  albuterol (PROVENTIL) (2.5 MG/3ML) 0.083% nebulizer solution 5 mg (5 mg Nebulization Given 12/04/15 1722)  ipratropium (ATROVENT) nebulizer solution 0.5 mg (0.5 mg Nebulization Given 12/04/15 1722)    BP 144/88 mmHg  Pulse 113  Temp(Src) 98.7 F (37.1 C) (Oral)  SpO2 90%  LMP 04/21/2014 No data found.   Physical Exam  Constitutional: She is oriented to person, place, and time. She appears well-developed and well-nourished. She appears distressed.  HENT:  Mouth/Throat: Oropharynx is clear and moist.  Neck: Normal range of motion. Neck supple.  Cardiovascular: Normal rate, normal heart sounds and intact distal pulses.   Pulmonary/Chest: She has wheezes.  Lymphadenopathy:    She has no cervical adenopathy.  Neurological: She is alert and oriented to person, place, and time.  Skin: Skin is warm and dry.   Nursing note and vitals reviewed.   ED Course  Procedures (including critical care time)  Labs Review Labs Reviewed - No data to display  Imaging Review No results found.   Visual Acuity Review  Right Eye Distance:   Left Eye Distance:   Bilateral Distance:    Right Eye Near:   Left Eye Near:    Bilateral Near:         MDM   1. COPD with acute exacerbation ([place])    Sent for resp care of smoker, asthma, copd, fever, chest pain.   Billy Fischer, MD 12/04/15 1723  Billy Fischer, MD 12/04/15 1726

## 2015-12-04 NOTE — ED Notes (Signed)
Patient still in xray  

## 2015-12-04 NOTE — ED Notes (Signed)
Orvil Feil, PA-C, at the bedside.

## 2015-12-24 MED FILL — ?ALLOPURINOL 100 MG TABLET: 100 | 30 days supply | Qty: 30 | Fill #1

## 2015-12-24 MED FILL — ?CYCLOBENZAPRINE 10 MG TABL: 10 | 30 days supply | Qty: 60 | Fill #1

## 2015-12-24 MED FILL — $Cymbalta 30mg capsule: 30 | 30 days supply | Qty: 30 | Fill #4

## 2015-12-24 MED FILL — ATENOLOL 50 MG TABLET: 50 | 30 days supply | Qty: 30 | Fill #1

## 2015-12-24 MED FILL — LOSARTAN-HCTZ 100-25 MG TAB: 100-25 | 30 days supply | Qty: 30 | Fill #1

## 2015-12-30 ENCOUNTER — Ambulatory Visit (HOSPITAL_COMMUNITY): Admission: RE | Admit: 2015-12-30 | Payer: Self-pay | Source: Ambulatory Visit

## 2016-01-07 ENCOUNTER — Ambulatory Visit (HOSPITAL_COMMUNITY): Admission: RE | Admit: 2016-01-07 | Payer: Self-pay | Source: Ambulatory Visit

## 2016-01-14 ENCOUNTER — Ambulatory Visit (HOSPITAL_COMMUNITY): Admission: RE | Admit: 2016-01-14 | Payer: Self-pay | Source: Ambulatory Visit

## 2016-01-18 ENCOUNTER — Telehealth: Payer: Self-pay | Admitting: Neurology

## 2016-01-18 NOTE — Telephone Encounter (Signed)
PT called and said she was supposed to be taking some medication but has not received any yet/Dawn CB# [phone]

## 2016-01-19 NOTE — Telephone Encounter (Signed)
It looks like medication was discussed on 10-30-15 but I don't see where anything was decided on.  Please advise.

## 2016-01-19 NOTE — Telephone Encounter (Signed)
Tiffany Brady 2061/02/07. She was calling regarding a medication that was for her MS. She said she has not received it. She has no income, but was wondering if her coverage would cover it. She would like you to call her at [phone].  Thank you

## 2016-01-19 NOTE — Telephone Encounter (Signed)
Patient notified that I do not have the form.  She will come by to sign another tecfidera form.

## 2016-01-19 NOTE — Telephone Encounter (Signed)
Last clinic visit states we have decided to start Tecfidera.  We will need to send the paperwork and request for financial assistance.  Donika K. Posey Pronto, DO

## 2016-01-26 ENCOUNTER — Ambulatory Visit: Payer: Self-pay

## 2016-01-26 MED FILL — ALLOPURINOL 100 MG TABLET: 100 | 30 days supply | Qty: 30 | Fill #2

## 2016-01-26 MED FILL — LOSARTAN-HCTZ 100-25 MG TAB: 100-25 | 30 days supply | Qty: 30 | Fill #2

## 2016-01-26 MED FILL — ?CYCLOBENZAPRINE 10 MG TABL: 10 | 30 days supply | Qty: 60 | Fill #2

## 2016-01-26 MED FILL — !CYMBALTA 30MG CAPSULE: 30 | 30 days supply | Qty: 30 | Fill #5

## 2016-01-27 ENCOUNTER — Ambulatory Visit (HOSPITAL_COMMUNITY)
Admission: RE | Admit: 2016-01-27 | Discharge: 2016-01-27 | Disposition: A | Discharge status: Home / Self Care | Payer: Self-pay | Source: Ambulatory Visit | Attending: Orthopaedic Surgery | Admitting: Orthopaedic Surgery

## 2016-01-27 DIAGNOSIS — M5136 Other intervertebral disc degeneration, lumbar region: Secondary | ICD-10-CM | POA: Insufficient documentation

## 2016-01-27 DIAGNOSIS — R29890 Loss of height: Secondary | ICD-10-CM | POA: Insufficient documentation

## 2016-01-27 DIAGNOSIS — M5127 Other intervertebral disc displacement, lumbosacral region: Secondary | ICD-10-CM | POA: Insufficient documentation

## 2016-01-27 DIAGNOSIS — M545 Low back pain: Secondary | ICD-10-CM

## 2016-01-27 DIAGNOSIS — M1288 Other specific arthropathies, not elsewhere classified, other specified site: Secondary | ICD-10-CM | POA: Insufficient documentation

## 2016-02-01 ENCOUNTER — Encounter: Payer: Self-pay | Admitting: Internal Medicine

## 2016-02-01 ENCOUNTER — Other Ambulatory Visit: Payer: Self-pay | Admitting: Internal Medicine

## 2016-02-01 ENCOUNTER — Ambulatory Visit: Payer: Self-pay | Attending: Internal Medicine | Admitting: Internal Medicine

## 2016-02-01 VITALS — BP 128/84 | HR 79 | Temp 97.6°F | Resp 16 | Ht 64.0 in | Wt 252.8 lb

## 2016-02-01 DIAGNOSIS — M79674 Pain in right toe(s): Secondary | ICD-10-CM

## 2016-02-01 DIAGNOSIS — Z1231 Encounter for screening mammogram for malignant neoplasm of breast: Secondary | ICD-10-CM

## 2016-02-01 DIAGNOSIS — Z1211 Encounter for screening for malignant neoplasm of colon: Secondary | ICD-10-CM

## 2016-02-01 DIAGNOSIS — G894 Chronic pain syndrome: Secondary | ICD-10-CM

## 2016-02-01 DIAGNOSIS — N183 Chronic kidney disease, stage 3 unspecified: Secondary | ICD-10-CM

## 2016-02-01 DIAGNOSIS — I10 Essential (primary) hypertension: Secondary | ICD-10-CM

## 2016-02-01 DIAGNOSIS — Z1239 Encounter for other screening for malignant neoplasm of breast: Secondary | ICD-10-CM

## 2016-02-01 DIAGNOSIS — M79675 Pain in left toe(s): Secondary | ICD-10-CM

## 2016-02-01 DIAGNOSIS — Z720 Tobacco use: Secondary | ICD-10-CM

## 2016-02-01 LAB — BASIC METABOLIC PANEL WITH GFR
BUN: 15 mg/dL (ref 7–25)
CALCIUM: 8.9 mg/dL (ref 8.6–10.4)
CO2: 28 mmol/L (ref 20–31)
CREATININE: 1.34 mg/dL — AB (ref 0.50–1.05)
Chloride: 106 mmol/L (ref 98–110)
GFR, EST AFRICAN AMERICAN: 51 mL/min — AB (ref >=60)
GFR, Est Non African American: 44 mL/min — ABNORMAL LOW (ref >=60)
Glucose, Bld: 97 mg/dL (ref 65–99)
Potassium: 4.1 mmol/L (ref 3.5–5.3)
Sodium: 139 mmol/L (ref 135–146)

## 2016-02-01 MED ORDER — BUPROPION HCL ER (SR) 150 MG PO TB12: 150.0000 mg | ORAL_TABLET | Freq: Two times a day (BID) | ORAL | Status: DC

## 2016-02-01 MED ORDER — NYSTATIN 100000 UNIT/GM EX POWD: 5.0000 g | Freq: Three times a day (TID) | CUTANEOUS | Status: DC

## 2016-02-01 MED ORDER — GABAPENTIN 300 MG PO CAPS: 300.0000 mg | ORAL_CAPSULE | Freq: Three times a day (TID) | ORAL | Status: DC

## 2016-02-01 MED ORDER — DICLOFENAC SODIUM 1 % TD GEL: 2.0000 g | Freq: Four times a day (QID) | TRANSDERMAL | Status: DC

## 2016-02-01 NOTE — Progress Notes (Signed)
Pt here for a persistent cough that started after having pneumonia about a month ago. Pt reports pain in both of her big toes rated at a 10. Pain is constant and has been present for months now. Pain described as throbbing. Pt also reports blotches and irritable area under her left breast.

## 2016-02-01 NOTE — Progress Notes (Signed)
Tiffany Brady, is a 55 y.o. female  [id number]  [id number]  DOB - 08/10/1961  CC:  Chief Complaint  Patient presents with  . Cough       HPI: Tiffany Brady is a 55 y.o. female w/ MS followed by Neurology, htn, chronic pain syndrome and gout, here today to establish medical care.   Patient was last seen in our clinic in February 2017 with nurse practitioner. She states she has not started the medications (Tecfidera) as recommended per neurology for her MS b/c of paperwork issues.    Of note, she was seen in the ED on March 24 for chest pain, shortness of breath and diagnosed with possible pneumonia and started on a Z-Pak. Since then feeling better but still has an occasional intermittent nonproductive cough. She denies any shortness of breath or wheezing, but states she does smoke a pack cigarretts every 1.5 days.  She wants to quit smoking but the NicoDerm patch and gums did not help her in the past.  Of note, she is due for mammogram, colonoscopy, as well as a Pap smear. She complains of her bilateral toes hurting greatly the past year or so, especially when her toenails are touching the shoes are when she in flexing her great toes.    Patient has No headache, No chest pain, No abdominal pain - No Nausea, No new weakness tingling or numbness, No Cough - SOB.  Allergies  Allergen Reactions  . Pork-Derived Metallurgist   Past Medical History  Diagnosis Date  . Hypertension   . Chronic pain syndrome   . Tobacco use   . MS (multiple sclerosis) ([place])   . Gout    Current Outpatient Prescriptions on File Prior to Visit  Medication Sig Dispense Refill  . allopurinol (ZYLOPRIM) 100 MG tablet Take 1 tablet (100 mg total) by mouth daily. 30 tablet 6  . atenolol (TENORMIN) 50 MG tablet Take 1 tablet (50 mg total) by mouth daily. 30 tablet 4  . benzonatate (TESSALON) 100 MG capsule Take 1 capsule (100 mg total) by mouth every 8 (eight) hours. 21 capsule 0  . cyclobenzaprine  (FLEXERIL) 10 MG tablet Take 1 tablet (10 mg total) by mouth 2 (two) times daily as needed for muscle spasms. (Patient taking differently: Take 10 mg by mouth 2 (two) times daily. ) 60 tablet 3  . DULoxetine (CYMBALTA) 30 MG capsule Take 1 capsule (30 mg total) by mouth daily. 120 capsule 3  . losartan-hydrochlorothiazide (HYZAAR) 100-25 MG tablet Take 1 tablet by mouth daily. 30 tablet 4  . azithromycin (ZITHROMAX) 250 MG tablet Take 1 tablet (250 mg total) by mouth daily. Take first 2 tablets together, then 1 every day until finished. (Patient not taking: Reported on 02/01/2016) 6 tablet 0  . Dimethyl Fumarate 120 & 240 MG MISC Take by mouth. Reported on 02/01/2016    . Vitamin D, Ergocalciferol, (DRISDOL) 50000 UNITS CAPS capsule Take 1 capsule (50,000 Units total) by mouth every 7 (seven) days. Take 1 capsule by mouth every 7 days x 10 weeks (Patient not taking: Reported on 02/01/2016) 10 capsule 0   No current facility-administered medications on file prior to visit.   Family History  Problem Relation Age of Onset  . Cancer Mother     Deceased  . Hypertension Mother   . HIV/AIDS Brother     Deceased  . Multiple sclerosis Daughter   . Other Father     Deceased, 42   Social History  Social History  . Marital Status: Widowed    Spouse Name: N/A  . Number of Children: N/A  . Years of Education: N/A   Occupational History  . Not on file.   Social History Main Topics  . Smoking status: Current Every Day Smoker -- 0.75 packs/day for 40 years    Types: Cigarettes  . Smokeless tobacco: Never Used  . Alcohol Use: 0.0 oz/week    0 Standard drinks or equivalent per week     Comment: occasional  . Drug Use: No  . Sexual Activity: Not on file   Other Topics Concern  . Not on file   Social History Narrative   Lives with niece and her 3 children in a 2 story home.     Only goes upstairs once a day.  Has 1 child.  Does not work.  Trying to get disability.  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     Education: 10th grade.    Review of Systems: Constitutional: Negative for fever, chills, diaphoresis, activity change, appetite change and fatigue. HENT: Negative for ear pain, nosebleeds, congestion, facial swelling, rhinorrhea, neck pain, neck stiffness and ear discharge.  Eyes: Negative for pain, discharge, redness, itching and visual disturbance. Respiratory: Negative for choking, chest tightness, shortness of breath, wheezing and stridor.   +intermittant cough, +smoking x 40years now.   Cardiovascular: Negative for chest pain, palpitations and leg swelling. Gastrointestinal: Negative for abdominal distention. Genitourinary: Negative for dysuria, urgency, frequency, hematuria, flank pain, decreased urine volume, difficulty urinating and dyspareunia.  Musculoskeletal: Negative for back pain, joint swelling, arthralgia and gait problem.  Bilateral great toe pains, especially at bottom of toes and at the nail bed. Neurological: Negative for dizziness, tremors, seizures, syncope, facial asymmetry, speech difficulty, weakness, light-headedness, numbness and headaches.  Hematological: Negative for adenopathy. Does not bruise/bleed easily. Psychiatric/Behavioral: Negative for hallucinations, behavioral problems, confusion, dysphoric mood, decreased concentration and agitation.    Objective:   Filed Vitals:   02/01/16 0933  BP: 128/84  Pulse: 79  Temp: 97.6 F (36.4 C)  Resp: 16    Filed Weights   02/01/16 0933  Weight: 252 lb 12.8 oz (114.669 kg)    BP Readings from Last 3 Encounters:  02/01/16 128/84  12/04/15 126/65  12/04/15 144/88    Physical Exam: Constitutional: Patient appears well-developed and well-nourished. No distress. AAOx3, obese, pleasant HENT: Normocephalic, atraumatic, External right and left ear normal. Oropharynx is clear and moist.  Eyes: Conjunctivae and EOM are normal. PERRL, no scleral icterus. Neck: Normal ROM.  Neck supple. No JVD. No tracheal deviation. No thyromegaly. CVS: RRR, S1/S2 +, no murmurs, no gallops, no carotid bruit.  Pulmonary: Effort and breath sounds normal, no stridor, rhonchi, wheezes, rales.  Abdominal: Soft. BS +, obese, no distension, tenderness, rebound or guarding.  Musculoskeletal: Normal range of motion. No edema and no tenderness.  Foot exam: bilateral peripheral pulses 2+ (dorsalis pedis and post tibialis pulses), no ulcers noted/no ecchymosis, warm to touch, Sensation intact.  No c/c/e.  + ttp on exam in flexion of bilateral great toes., no bunions/sores noted.  Neuro: Alert. Normal reflexes, muscle tone coordination. No cranial nerve deficit grossly. Skin: Skin is warm and dry. No rash noted. Not diaphoretic. No erythema. No pallor. Psychiatric: Normal mood and affect. Behavior, judgment, thought content normal.  Lab Results  Component Value Date   WBC 11.2* 12/04/2015   HGB 15.3* 12/04/2015   HCT 45.4 12/04/2015   MCV 90.4 12/04/2015  PLT 172 12/04/2015   Lab Results  Component Value Date   CREATININE 1.77* 12/04/2015   BUN 20 12/04/2015   NA 131* 12/04/2015   K 3.6 12/04/2015   CL 98* 12/04/2015   CO2 20* 12/04/2015    No results found for: HGBA1C Lipid Panel     Component Value Date/Time   CHOL 146 11/02/2015 1235   TRIG 59 11/02/2015 1235   HDL 46 11/02/2015 1235   CHOLHDL 3.2 11/02/2015 1235   VLDL 12 11/02/2015 1235   LDLCALC 88 11/02/2015 1235       Depression screen PHQ 2/9 02/01/2016  Decreased Interest 0  Down, Depressed, Hopeless 0  PHQ - 2 Score 0    Assessment and plan:   1. Essential hypertension Controlled, continue hyzaar - Hemoglobin A1c - dm screening.  2. Tobacco abuse, w/ intermitant peristent cough -tob cessation counseling given. -Discussed with patient on length regarding tobacco cessation. She is interested in trying Wellbutrin/Zyban to help with smoking cessation at this time. - per pt never had dx of copd/formal  PFTS., consider testing. Will discuss at next visit.  3. CKD (chronic kidney disease) stage 3, GFR 30-59 ml/min Per labs reviewed,. ?due to htn? - BASIC METABOLIC PANEL WITH GFR - Microalbumin/Creatinine Ratio, Urine  4. Chronic pain syndrome - gabapentin (NEURONTIN) 300 MG capsule; Take 1 capsule (300 mg total) by mouth 3 (three) times daily.  Dispense: 180 capsule; Refill: 5 - advised pt to reduce neurontin for now until renal function labs return  5. Breast cancer screening - MM Digital Screening; Future  6. Colon cancer screening - Ambulatory referral to Gastroenterology  7. Toe pain, bilateral, ?neuropathy vs plantar fascilitis - will f/u in couple weeks to see if resolved, already on neurontin - trial voltarin gel   Return in about 2 weeks (around 02/15/2016) for pap smear. - consider formal PFTs The patient was given clear instructions to go to ER or return to medical center if symptoms don't improve, worsen or new problems develop. The patient verbalized understanding. The patient was told to call to get lab results if they haven't heard anything in the next week.      Maren Reamer, MD, [facility] [place], [place]   02/01/2016, 12:48 PM

## 2016-02-01 NOTE — Patient Instructions (Addendum)
DASH Eating Plan DASH stands for "Dietary Approaches to Stop Hypertension." The DASH eating plan is a healthy eating plan that has been shown to reduce high blood pressure (hypertension). Additional health benefits may include reducing the risk of type 2 diabetes mellitus, heart disease, and stroke. The DASH eating plan may also help with weight loss. WHAT DO I NEED TO KNOW ABOUT THE DASH EATING PLAN? For the DASH eating plan, you will follow these general guidelines:  Choose foods with a percent daily value for sodium of less than 5% (as listed on the food label).  Use salt-free seasonings or herbs instead of table salt or sea salt.  Check with your health care provider or pharmacist before using salt substitutes.  Eat lower-sodium products, often labeled as "lower sodium" or "no salt added."  Eat fresh foods.  Eat more vegetables, fruits, and low-fat dairy products.  Choose whole grains. Look for the word "whole" as the first word in the ingredient list.  Choose fish and skinless chicken or turkey more often than red meat. Limit fish, poultry, and meat to 6 oz (170 g) each day.  Limit sweets, desserts, sugars, and sugary drinks.  Choose heart-healthy fats.  Limit cheese to 1 oz (28 g) per day.  Eat more home-cooked food and less restaurant, buffet, and fast food.  Limit fried foods.  Cook foods using methods other than frying.  Limit canned vegetables. If you do use them, rinse them well to decrease the sodium.  When eating at a restaurant, ask that your food be prepared with less salt, or no salt if possible. WHAT FOODS CAN I EAT? Seek help from a dietitian for individual calorie needs. Grains Whole grain or whole wheat bread. Brown rice. Whole grain or whole wheat pasta. Quinoa, bulgur, and whole grain cereals. Low-sodium cereals. Corn or whole wheat flour tortillas. Whole grain cornbread. Whole grain crackers. Low-sodium crackers. Vegetables Fresh or frozen vegetables  (raw, steamed, roasted, or grilled). Low-sodium or reduced-sodium tomato and vegetable juices. Low-sodium or reduced-sodium tomato sauce and paste. Low-sodium or reduced-sodium canned vegetables.  Fruits All fresh, canned (in natural juice), or frozen fruits. Meat and Other Protein Products Ground beef (85% or leaner), grass-fed beef, or beef trimmed of fat. Skinless chicken or turkey. Ground chicken or turkey. Pork trimmed of fat. All fish and seafood. Eggs. Dried beans, peas, or lentils. Unsalted nuts and seeds. Unsalted canned beans. Dairy Low-fat dairy products, such as skim or 1% milk, 2% or reduced-fat cheeses, low-fat ricotta or cottage cheese, or plain low-fat yogurt. Low-sodium or reduced-sodium cheeses. Fats and Oils Tub margarines without trans fats. Light or reduced-fat mayonnaise and salad dressings (reduced sodium). Avocado. Safflower, olive, or canola oils. Natural peanut or almond butter. Other Unsalted popcorn and pretzels. The items listed above may not be a complete list of recommended foods or beverages. Contact your dietitian for more options. WHAT FOODS ARE NOT RECOMMENDED? Grains White bread. White pasta. White rice. Refined cornbread. Bagels and croissants. Crackers that contain trans fat. Vegetables Creamed or fried vegetables. Vegetables in a cheese sauce. Regular canned vegetables. Regular canned tomato sauce and paste. Regular tomato and vegetable juices. Fruits Dried fruits. Canned fruit in light or heavy syrup. Fruit juice. Meat and Other Protein Products Fatty cuts of meat. Ribs, chicken wings, bacon, sausage, bologna, salami, chitterlings, fatback, hot dogs, bratwurst, and packaged luncheon meats. Salted nuts and seeds. Canned beans with salt. Dairy Whole or 2% milk, cream, half-and-half, and cream cheese. Whole-fat or sweetened yogurt. Full-fat   cheeses or blue cheese. Nondairy creamers and whipped toppings. Processed cheese, cheese spreads, or cheese  curds. Condiments Onion and garlic salt, seasoned salt, table salt, and sea salt. Canned and packaged gravies. Worcestershire sauce. Tartar sauce. Barbecue sauce. Teriyaki sauce. Soy sauce, including reduced sodium. Steak sauce. Fish sauce. Oyster sauce. Cocktail sauce. Horseradish. Ketchup and mustard. Meat flavorings and tenderizers. Bouillon cubes. Hot sauce. Tabasco sauce. Marinades. Taco seasonings. Relishes. Fats and Oils Butter, stick margarine, lard, shortening, ghee, and bacon fat. Coconut, palm kernel, or palm oils. Regular salad dressings. Other Pickles and olives. Salted popcorn and pretzels. The items listed above may not be a complete list of foods and beverages to avoid. Contact your dietitian for more information. WHERE CAN I FIND MORE INFORMATION? National Heart, Lung, and Blood Institute: travelstabloid.com   This information is not intended to replace advice given to you by your health care provider. Make sure you discuss any questions you have with your health care provider.   Document Released: 08/18/2011 Document Revised: 09/19/2014 Document Reviewed: 07/03/2013 Elsevier Interactive Patient Education 2016 Elsevier Inc[employer] \- Smoking Cessation, Tips for Success If you are ready to quit smoking, congratulations! You have chosen to help yourself be healthier. Cigarettes bring nicotine, tar, carbon monoxide, and other irritants into your body. Your lungs, heart, and blood vessels will be able to work better without these poisons. There are many different ways to quit smoking. Nicotine gum, nicotine patches, a nicotine inhaler, or nicotine nasal spray can help with physical craving. Hypnosis, support groups, and medicines help break the habit of smoking. WHAT THINGS CAN I DO TO MAKE QUITTING EASIER?  Here are some tips to help you quit for good:  Pick a date when you will quit smoking completely. Tell all of your friends and family about your plan  to quit on that date.  Do not try to slowly cut down on the number of cigarettes you are smoking. Pick a quit date and quit smoking completely starting on that day.  Throw away all cigarettes.   Clean and remove all ashtrays from your home, work, and car.  On a card, write down your reasons for quitting. Carry the card with you and read it when you get the urge to smoke.  Cleanse your body of nicotine. Drink enough water and fluids to keep your urine clear or pale yellow. Do this after quitting to flush the nicotine from your body.  Learn to predict your moods. Do not let a bad situation be your excuse to have a cigarette. Some situations in your life might tempt you into wanting a cigarette.  Never have "just one" cigarette. It leads to wanting another and another. Remind yourself of your decision to quit.  Change habits associated with smoking. If you smoked while driving or when feeling stressed, try other activities to replace smoking. Stand up when drinking your coffee. Brush your teeth after eating. Sit in a different chair when you read the paper. Avoid alcohol while trying to quit, and try to drink fewer caffeinated beverages. Alcohol and caffeine may urge you to smoke.  Avoid foods and drinks that can trigger a desire to smoke, such as sugary or spicy foods and alcohol.  Ask people who smoke not to smoke around you.  Have something planned to do right after eating or having a cup of coffee. For example, plan to take a walk or exercise.  Try a relaxation exercise to calm you down and decrease your stress. Remember, you may  be tense and nervous for the first 2 weeks after you quit, but this will pass.  Find new activities to keep your hands busy. Play with a pen, coin, or rubber band. Doodle or draw things on paper.  Brush your teeth right after eating. This will help cut down on the craving for the taste of tobacco after meals. You can also try mouthwash.   Use oral  substitutes in place of cigarettes. Try using lemon drops, carrots, cinnamon sticks, or chewing gum. Keep them handy so they are available when you have the urge to smoke.  When you have the urge to smoke, try deep breathing.  Designate your home as a nonsmoking area.  If you are a heavy smoker, ask your health care provider about a prescription for nicotine chewing gum. It can ease your withdrawal from nicotine.  Reward yourself. Set aside the cigarette money you save and buy yourself something nice.  Look for support from others. Join a support group or smoking cessation program. Ask someone at home or at work to help you with your plan to quit smoking.  Always ask yourself, "Do I need this cigarette or is this just a reflex?" Tell yourself, "Today, I choose not to smoke," or "I do not want to smoke." You are reminding yourself of your decision to quit.  Do not replace cigarette smoking with electronic cigarettes (commonly called e-cigarettes). The safety of e-cigarettes is unknown, and some may contain harmful chemicals.  If you relapse, do not give up! Plan ahead and think about what you will do the next time you get the urge to smoke. HOW WILL I FEEL WHEN I QUIT SMOKING? You may have symptoms of withdrawal because your body is used to nicotine (the addictive substance in cigarettes). You may crave cigarettes, be irritable, feel very hungry, cough often, get headaches, or have difficulty concentrating. The withdrawal symptoms are only temporary. They are strongest when you first quit but will go away within 10-14 days. When withdrawal symptoms occur, stay in control. Think about your reasons for quitting. Remind yourself that these are signs that your body is healing and getting used to being without cigarettes. Remember that withdrawal symptoms are easier to treat than the major diseases that smoking can cause.  Even after the withdrawal is over, expect periodic urges to smoke. However, these  cravings are generally short lived and will go away whether you smoke or not. Do not smoke! WHAT RESOURCES ARE AVAILABLE TO HELP ME QUIT SMOKING? Your health care provider can direct you to community resources or hospitals for support, which may include:  Group support.  Education.  Hypnosis.  Therapy.   This information is not intended to replace advice given to you by your health care provider. Make sure you discuss any questions you have with your health care provider.   Document Released: 05/27/2004 Document Revised: 09/19/2014 Document Reviewed: 02/14/2013 Elsevier Interactive Patient Education 2016 [employer].  -

## 2016-02-02 ENCOUNTER — Other Ambulatory Visit: Payer: Self-pay | Admitting: Internal Medicine

## 2016-02-02 LAB — MICROALBUMIN / CREATININE URINE RATIO
CREATININE, URINE: 276 mg/dL (ref 20–320)
Microalb Creat Ratio: 1 mcg/mg creat (ref <30)
Microalb, Ur: 0.3 mg/dL

## 2016-02-02 LAB — HEMOGLOBIN A1C
Hgb A1c MFr Bld: 6.9 % — ABNORMAL HIGH (ref <5.7)
MEAN PLASMA GLUCOSE: 151 mg/dL

## 2016-02-02 MED ORDER — METFORMIN HCL 500 MG PO TABS: 500.0000 mg | ORAL_TABLET | Freq: Two times a day (BID) | ORAL | Status: DC

## 2016-02-02 MED ORDER — TRUEPLUS LANCETS 26G MISC: 1.0000 | Freq: Three times a day (TID) | Status: DC | PRN

## 2016-02-02 MED ORDER — TRUE METRIX GO GLUCOSE METER W/DEVICE KIT: 1.0000 | PACK | Freq: Three times a day (TID) | Status: DC | PRN

## 2016-02-02 MED ORDER — GLUCOSE BLOOD VI STRP
ORAL_STRIP | Status: DC
Start: 2016-02-02 — End: 2016-03-28

## 2016-02-10 ENCOUNTER — Telehealth: Payer: Self-pay | Admitting: *Deleted

## 2016-02-10 NOTE — Telephone Encounter (Signed)
-----   Message from Maren Reamer, MD sent at 02/02/2016  1:10 PM EDT ----- Please call pt about labs.  +new dx dm.  Please ask her to make appt w/ [facility] 1-2 wks for dm education. I put in rx for glucometer as well, and she can pick up and bring into [facility] to be shown how to use.  Of note. Dm teaching below, limit carbs, more vegi/fiber! Dash diet for htn as well.  Has ckd 3, likely from dm and htn.  Need to tight control this so her kidney function does not get worse.  +metformin rx 500bid, pick up. Will cause some initial gi discomfort/fullness, which helps w/ weightloss and controls dm better. Thanks.  Aim for 30 minutes of exercise most days. Rethink what you drink. Water is great! Aim for 2-3 Carb Choices per meal (30-45 grams) +/- 1 either way.  Aim for 0-15 Carbs per snack if hungry.  Include protein in moderation with your meals and snacks.  Consider reading food labels for Total Carbohydrate and Fat Grams of foods  Consider checking blood glucose (accuchecks/BG) at alternate times per day.  Continue taking medication as directed. Be mindful about how much sugar you are adding to beverages and other foods. Fruit Punch - find one with no sugar  Measure and decrease portions of carbohydrate foods. Make your plate and don't go back for seconds.

## 2016-02-10 NOTE — Telephone Encounter (Signed)
Patient verified DOB Patient is aware of new Diagnosis of DM. Patient will be scheduled to meet with Erline Levine in 2 weeks and bring the log which was ordered for her to the visit,. Patient is aware of beginning metformin BID and she is aware of gi side effects which can assist in weightloss and DM control. Patient states she begins Tecifedra on this Thursday for MS and wants to be sure there are no contraindications. Patient is aware of message being routed to PCP for advice. No further questions at this time.

## 2016-02-10 NOTE — Telephone Encounter (Signed)
Please call her Tiffany Brady I looked up Tecfidera with metformin, as well as atenolol, hyzaar , and wellbutrin and did not find any significant contraindications/recations.  However, w/ tecfidera, she may get some abdominal pain, n/v/rash/diarrhea. So if she is having similar gi sx on metformin, they may become worse.  Please call us asap if she has severe rx/concerns and seek medical help. Thanks.

## 2016-02-11 ENCOUNTER — Telehealth: Payer: Self-pay | Admitting: *Deleted

## 2016-02-11 NOTE — Telephone Encounter (Signed)
Patient verified DOB Patient is aware of PCP stating her current medications and new MS medication have no significant reactions. Patient is aware of both medications having similar side effects such as abdominal discomfort and diarrhea. Patient states she was made aware of MS medication causing a rash. Patient advised to watch and manage symptoms over the next few weeks. Patient advised to seek medical attention if the symptoms are unbearable or extreme. Patient expressed her understanding and had no further question at this time.

## 2016-02-15 ENCOUNTER — Other Ambulatory Visit: Payer: Self-pay | Admitting: Internal Medicine

## 2016-02-17 ENCOUNTER — Ambulatory Visit: Payer: Self-pay

## 2016-02-22 ENCOUNTER — Other Ambulatory Visit: Payer: Self-pay | Admitting: Internal Medicine

## 2016-02-29 ENCOUNTER — Ambulatory Visit (INDEPENDENT_AMBULATORY_CARE_PROVIDER_SITE_OTHER): Payer: Self-pay | Admitting: Neurology

## 2016-02-29 ENCOUNTER — Encounter: Payer: Self-pay | Admitting: Neurology

## 2016-02-29 VITALS — BP 140/100 | HR 90 | Ht 64.0 in | Wt 254.0 lb

## 2016-02-29 DIAGNOSIS — G35 Multiple sclerosis: Secondary | ICD-10-CM

## 2016-02-29 NOTE — Patient Instructions (Addendum)
Start Tecfidera as instructed  Return to clinic in 5 months

## 2016-02-29 NOTE — Progress Notes (Signed)
Follow-up Visit   Date: 02/29/2016   Elin Fenley MRN: [id number] DOB: 1960-12-30   Interim History: Tiffany Brady is a 55 y.o. right-handed African American female with chronic pain syndrome, fibromyaglia, hypertension, and tobacco use returning to the clinic for follow-up of left sided weakness and pain.  The patient was accompanied to the clinic by self.  History of present illness: Starting in July 2016, she began experiencing numbness/tingling over the tips of her fingers on both hands which has steadily involved the whole hand. Denies any exacerbating or alleviating factors. She often tries to shake her hands because they always feel asleep. She also has numbness over the right leg above the knees, but this is intermittent. She endorses pain related weakness and has difficulty with walking and doing daily activities such as bathing and cooking because of the pain.   She also has a lot of whole body pain described as soreness which is tender to palpation. She feels as if someone cut her and bleeding on the inside or as if someone is breaking her collar bone.  She was recently started on Cymbalta helps alleviate the pain where she can function, but it is still always there. Her ANA, RF, and TSH is normal.   UPDATE 08/03/2015:  Patient presented to her PCP on 10/31 with complaints of generalized weakness, gait difficulty, bowel and bladder incontinenced and concerned about MS due to her daughter diagnosed with this.  She underwent MRI brain which showed white matter changes involving the periventricular region and subcortical white matter, concerning for MS.  Although initial imaging also suggested cervical cord compression, subsequent imaging of her cervical spine only showed multilevel spinal stenosis without cord involvement.   She complains episodic left sided weakness and numbness occuring several times per day, lasting a few minutes. She reports having urge incontinence  for the past 15 year, which is triggered by sneezing, coughing, or laughing. She does not have a regular OBGYN provider. She complains of gait difficulty but denies walks and walks unassisted.  She has chronic pain involving her whole body.  UPDATE 09/04/2015:  She started gabapentin 386m BID but had not noticed any changes in her left arm.  She is not using wrist splints due to expense.  She underwent CSF testing which showed normal IgG index, but there was increased oligoclonal bands both in the CSF and serum, but moreso apparent in the CSF.  She denies any new complaints.  She is seeing ortho tomorrow for left knee buckling and pain.  UPDATE 10/30/2015:  Since treating her with 3-days of IVMP, she did not notice any difference in any of her symptoms.  She continues to complain of a lot of myalgias and generalized pain throughout (knees, hips, elbows).  She does not see pain management and states her PCP manages her fibromyalgia.    UPDATE 02/29/2016:  She received her Tecfidera, but has not started this yet. She reports being confused and overwhelmed with all the new medications that she takes, so even contemplated stopping everything.  Upon further questioning, she states she was diagnosed with diabetes and kidney disease.  She has achy pain, and especially of the great toe bilaterally.  She denies numbness/tingling.    Medications:  Current Outpatient Prescriptions on File Prior to Visit  Medication Sig Dispense Refill  . allopurinol (ZYLOPRIM) 100 MG tablet Take 1 tablet (100 mg total) by mouth daily. 30 tablet 6  . atenolol (TENORMIN) 50 MG tablet Take 1 tablet (50 mg  total) by mouth daily. 30 tablet 4  . azithromycin (ZITHROMAX) 250 MG tablet Take 1 tablet (250 mg total) by mouth daily. Take first 2 tablets together, then 1 every day until finished. 6 tablet 0  . benzonatate (TESSALON) 100 MG capsule Take 1 capsule (100 mg total) by mouth every 8 (eight) hours. 21 capsule 0  . buPROPion  (WELLBUTRIN SR) 150 MG 12 hr tablet Take 1 tablet (150 mg total) by mouth 2 (two) times daily. 60 tablet 2  . cyclobenzaprine (FLEXERIL) 10 MG tablet Take 1 tablet (10 mg total) by mouth 2 (two) times daily as needed for muscle spasms. (Patient taking differently: Take 10 mg by mouth 2 (two) times daily. ) 60 tablet 3  . diclofenac sodium (VOLTAREN) 1 % GEL Apply 2 g topically 4 (four) times daily. 100 g 2  . Dimethyl Fumarate 120 & 240 MG MISC Take by mouth. Reported on 02/01/2016    . DULoxetine (CYMBALTA) 30 MG capsule Take 1 capsule (30 mg total) by mouth daily. 120 capsule 3  . gabapentin (NEURONTIN) 300 MG capsule Take 1 capsule (300 mg total) by mouth 3 (three) times daily. 180 capsule 5  . glucose blood test strip Use as instructed 100 each 12  . losartan-hydrochlorothiazide (HYZAAR) 100-25 MG tablet Take 1 tablet by mouth daily. 30 tablet 4  . metFORMIN (GLUCOPHAGE) 500 MG tablet Take 1 tablet (500 mg total) by mouth 2 (two) times daily with a meal. 60 tablet 3  . nystatin (NYSTATIN) powder Apply 5 g topically 3 (three) times daily. Under breast area 60 g 2  . Vitamin D, Ergocalciferol, (DRISDOL) 50000 UNITS CAPS capsule Take 1 capsule (50,000 Units total) by mouth every 7 (seven) days. Take 1 capsule by mouth every 7 days x 10 weeks 10 capsule 0  . Blood Glucose Monitoring Suppl (TRUE METRIX GO GLUCOSE METER) w/Device KIT 1 each by Does not apply route every 8 (eight) hours as needed. (Patient not taking: Reported on 02/29/2016) 1 kit 0  . TRUEPLUS LANCETS 26G MISC 1 each by Does not apply route every 8 (eight) hours as needed. (Patient not taking: Reported on 02/29/2016) 100 each 12   No current facility-administered medications on file prior to visit.    Allergies:  Allergies  Allergen Reactions  . Pork-Derived Products Hives    Review of Systems:  CONSTITUTIONAL: No fevers, chills, night sweats, or weight loss.  EYES: No visual changes or eye pain ENT: No hearing changes.  No  history of nose bleeds.   RESPIRATORY: No cough, wheezing and shortness of breath.   CARDIOVASCULAR: Negative for chest pain, and palpitations.   GI: Negative for abdominal discomfort, blood in stools or black stools.  No recent change in bowel habits.   GU:  +history of incontinence.   MUSCLOSKELETAL: +history of joint pain or swelling.  +myalgias.   SKIN: Negative for lesions, rash, and itching.   ENDOCRINE: Negative for cold or heat intolerance, polydipsia or goiter.   PSYCH:  + depression or anxiety symptoms.   NEURO: As Above.   Vital Signs:  BP 140/100 mmHg  Pulse 90  Ht 5' 4"  (1.626 m)  Wt 254 lb (115.214 kg)  BMI 43.58 kg/m2  LMP 04/21/2014  Neurological Exam: MENTAL STATUS including orientation to time, place, person, recent and remote memory, attention span and concentration, language, and fund of knowledge is normal.    CRANIAL NERVES: No visual field defects. Pupils equal round and reactive to light.  Normal conjugate,  extra-ocular eye movements in all directions of gaze.   Face is symmetric. Palate elevates symmetrically.  Tongue is midline.  MOTOR:  Motor strength is 5/5  in all extremities, after repeated effort and encouragement.  Tone is normal.    MSRs:  Reflexes are 2+/4 throughout  SENSORY:  Intact to vibration and temperature throughout.  COORDINATION/GAIT:  Gait is wide-based, slow.  Data: MRI cervical spine wwo contrast 07/31/2015: Multilevel degenerative change in the cervical spine with mild spinal stenosis at C3-4, C4-5, C5-6. Left foraminal narrowing at C5-6 due to disc and osteophyte  Spinal cord appears normal without cord lesion or abnormal enhancement. No cord compression.  MRI brain wwo contrast 07/24/2015: Extensive and premature for age white matter signal abnormality strongly suspicious for multiple sclerosis. Chronic microvascular ischemic changes are not excluded, but are less favored.  No restricted diffusion or abnormal postcontrast  enhancement at any location, but specifically in association with the white matter, to suggest an acute process.  Cervical spondylosis with suspected central disc protrusion/extrusion at C4-5 resulting in cord compression. This could contribute to numbness and pain in the extremities. Correlate clinically for radiculopathy or myelopathy. MRI cervical spine may be helpful in further evaluation as clinically indicated.  EMG of the upper extremities 05/26/2015: 1. Bilateral median neuropathy at or distal to the wrist, consistent with clinical diagnosis of carpal tunnel syndrome. Overall, these findings are mild in degree electrically and worse on the right. 2. There is no evidence of a cervical radiculopathy or diffuse myopathy affecting upper extremities.  Lab Results  Component Value Date   TSH 2.018 01/27/2014   Labs 09/29/2014: RF 12, ANA neg, ESR 23  CSF testing 08/14/2015:  R34 W0 P37 G67, IgG index 2.8, MPB < 2.0, OCB  >5 well defined gamma restriction bands that are also present in the  patient's corresponding serum sample, but some bands in the CSF are more prominent   IMPRESSION/PLAN: 1.  Probable relapsing remitting multiple sclerosis based on white matter changes on MRI brain and increased oligoclonal bands in the CSF.  IgG index was normal.  She does not have prior imaging for comparison, but based on the distribution of her white matter changes, this is most likely MS.  She did NOT have any improvement with 3-days of solumedrol suggesting that her pain is not related to MS.    - Patient was able to get financial clearance for Tecifdera.  I urged her to start the titration and continue Tecfidera 272m twice daily  - Check CBC and CMP at next visit  - MRI brain wwo to be performed after her next visit for annual surveillance  2.  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  3.  Bilateral carpal tunnel syndrome, noncompliant with wrist  splints  4.  Vitamin D deficiency, continue vitamin D 4000 units  5.  Chronic history of urge incontinence  6.  Fibromyalgia and chronic pain syndrome, followed by PCP  Return to clinic in 5 months   The duration of this appointment visit was 25 minutes of face-to-face time with the patient.  Greater than 50% of this time was spent in counseling, explanation of diagnosis, planning of further management, and coordination of care.   Thank you for allowing me to participate in patient's care.  If I can answer any additional questions, I would be pleased to do so.    Sincerely,    Donika K. PPosey Pronto DO

## 2016-03-28 ENCOUNTER — Encounter: Payer: Self-pay | Admitting: Internal Medicine

## 2016-03-28 ENCOUNTER — Ambulatory Visit: Payer: Self-pay | Attending: Internal Medicine | Admitting: Internal Medicine

## 2016-03-28 VITALS — BP 186/108 | HR 74 | Temp 98.3°F | Wt 251.0 lb

## 2016-03-28 DIAGNOSIS — G4700 Insomnia, unspecified: Secondary | ICD-10-CM | POA: Insufficient documentation

## 2016-03-28 DIAGNOSIS — E669 Obesity, unspecified: Secondary | ICD-10-CM | POA: Insufficient documentation

## 2016-03-28 DIAGNOSIS — E119 Type 2 diabetes mellitus without complications: Secondary | ICD-10-CM | POA: Insufficient documentation

## 2016-03-28 DIAGNOSIS — Z79899 Other long term (current) drug therapy: Secondary | ICD-10-CM | POA: Insufficient documentation

## 2016-03-28 DIAGNOSIS — N184 Chronic kidney disease, stage 4 (severe): Secondary | ICD-10-CM

## 2016-03-28 DIAGNOSIS — I129 Hypertensive chronic kidney disease with stage 1 through stage 4 chronic kidney disease, or unspecified chronic kidney disease: Secondary | ICD-10-CM | POA: Insufficient documentation

## 2016-03-28 DIAGNOSIS — R739 Hyperglycemia, unspecified: Secondary | ICD-10-CM

## 2016-03-28 DIAGNOSIS — E118 Type 2 diabetes mellitus with unspecified complications: Secondary | ICD-10-CM

## 2016-03-28 DIAGNOSIS — F17210 Nicotine dependence, cigarettes, uncomplicated: Secondary | ICD-10-CM | POA: Insufficient documentation

## 2016-03-28 DIAGNOSIS — E1122 Type 2 diabetes mellitus with diabetic chronic kidney disease: Secondary | ICD-10-CM | POA: Insufficient documentation

## 2016-03-28 DIAGNOSIS — I10 Essential (primary) hypertension: Secondary | ICD-10-CM

## 2016-03-28 DIAGNOSIS — Z9114 Patient's other noncompliance with medication regimen: Secondary | ICD-10-CM | POA: Insufficient documentation

## 2016-03-28 DIAGNOSIS — G894 Chronic pain syndrome: Secondary | ICD-10-CM | POA: Insufficient documentation

## 2016-03-28 DIAGNOSIS — M797 Fibromyalgia: Secondary | ICD-10-CM | POA: Insufficient documentation

## 2016-03-28 DIAGNOSIS — E790 Hyperuricemia without signs of inflammatory arthritis and tophaceous disease: Secondary | ICD-10-CM

## 2016-03-28 DIAGNOSIS — E559 Vitamin D deficiency, unspecified: Secondary | ICD-10-CM | POA: Insufficient documentation

## 2016-03-28 LAB — BASIC METABOLIC PANEL WITH GFR
BUN: 17 mg/dL (ref 7–25)
CALCIUM: 9.8 mg/dL (ref 8.6–10.4)
CHLORIDE: 105 mmol/L (ref 98–110)
CO2: 23 mmol/L (ref 20–31)
Creat: 1.4 mg/dL — ABNORMAL HIGH (ref 0.50–1.05)
GFR, EST NON AFRICAN AMERICAN: 42 mL/min — AB (ref >=60)
GFR, Est African American: 48 mL/min — ABNORMAL LOW (ref >=60)
Glucose, Bld: 90 mg/dL (ref 65–99)
Potassium: 4.3 mmol/L (ref 3.5–5.3)
SODIUM: 139 mmol/L (ref 135–146)

## 2016-03-28 LAB — URIC ACID: URIC ACID, SERUM: 6.2 mg/dL (ref 2.5–7.0)

## 2016-03-28 LAB — GLUCOSE, POCT (MANUAL RESULT ENTRY): POC GLUCOSE: 96 mg/dL (ref 70–99)

## 2016-03-28 MED ORDER — ATENOLOL 50 MG PO TABS: 50.0000 mg | ORAL_TABLET | Freq: Every day | ORAL | Status: DC

## 2016-03-28 MED ORDER — GLYBURIDE 5 MG PO TABS: 5.0000 mg | ORAL_TABLET | Freq: Every day | ORAL | Status: DC

## 2016-03-28 MED ORDER — DULOXETINE HCL 30 MG PO CPEP: 30.0000 mg | ORAL_CAPSULE | Freq: Every day | ORAL | Status: DC

## 2016-03-28 MED ORDER — LOSARTAN POTASSIUM-HCTZ 100-25 MG PO TABS: 1.0000 | ORAL_TABLET | Freq: Every day | ORAL | Status: DC

## 2016-03-28 MED ORDER — GLUCOSE BLOOD VI STRP: ORAL_STRIP | Status: DC

## 2016-03-28 MED ORDER — TRUEPLUS LANCETS 26G MISC: 1.0000 | Freq: Three times a day (TID) | Status: DC | PRN

## 2016-03-28 MED ORDER — TRUE METRIX GO GLUCOSE METER W/DEVICE KIT: 1.0000 | PACK | Freq: Three times a day (TID) | Status: DC | PRN

## 2016-03-28 MED ORDER — BUPROPION HCL ER (SR) 150 MG PO TB12: 150.0000 mg | ORAL_TABLET | Freq: Two times a day (BID) | ORAL | Status: DC

## 2016-03-28 MED ORDER — DICLOFENAC SODIUM 1 % TD GEL: 2.0000 g | Freq: Four times a day (QID) | TRANSDERMAL | Status: DC

## 2016-03-28 MED ORDER — CYCLOBENZAPRINE HCL 10 MG PO TABS: 10.0000 mg | ORAL_TABLET | Freq: Two times a day (BID) | ORAL | Status: DC | PRN

## 2016-03-28 MED ORDER — MELATONIN 3 MG PO CAPS: 1.0000 | ORAL_CAPSULE | Freq: Every evening | ORAL | Status: DC | PRN

## 2016-03-28 MED FILL — ?CYCLOBENZAPRINE 10 MG TABL: 10 | 30 days supply | Qty: 60 | Fill #3

## 2016-03-28 MED FILL — BUPROPION SR 150 MG TABLET: 150 | 30 days supply | Qty: 60 | Fill #0

## 2016-03-28 MED FILL — !TRUE METRIX BLOOD GLUCOSE: 1 days supply | Qty: 1 | Fill #0

## 2016-03-28 MED FILL — ?METFORMIN HCL 500MG TABLET: 500 | 30 days supply | Qty: 60 | Fill #0

## 2016-03-28 MED FILL — !CYMBALTA 30MG CAPSULE: 30 | 30 days supply | Qty: 30 | Fill #6

## 2016-03-28 MED FILL — ALLOPURINOL 100 MG TABLET: 100 | 30 days supply | Qty: 30 | Fill #3

## 2016-03-28 MED FILL — glyBURIDE 5 MG TABS: 5 | 30 days supply | Qty: 30 | Fill #0

## 2016-03-28 MED FILL — NYSTOP 100,000 UNITS/GM PWD: 100000 | 15 days supply | Qty: 60 | Fill #0

## 2016-03-28 MED FILL — TRUEplus LANCETS 28G MISC: 25 days supply | Qty: 100 | Fill #0

## 2016-03-28 MED FILL — LOSARTAN-HCTZ 100-25 MG TAB: 100-25 | 30 days supply | Qty: 30 | Fill #3

## 2016-03-28 MED FILL — VOLTAREN 1% GEL: 1 | 17 days supply | Qty: 100 | Fill #0

## 2016-03-28 MED FILL — TRUE METRIX TEST STRIP: 25 days supply | Qty: 100 | Fill #0

## 2016-03-28 NOTE — Progress Notes (Signed)
Tiffany Brady, is a 55 y.o. female  JAS:[id number]  ALP:[id number]  DOB - [birth year]  Chief Complaint  Patient presents with  . Follow-up    DM        Subjective:   Tiffany Brady is a 55 y.o. female here today for a follow up visit for htn,dm, fibromyagia, tob abuse. Since last seen in clinic, pt has had financial problems, and stopped taking ALL her meds for the last month at least.  C/o of ongoing right thigh pain, posterior region, had tattoo placed on right thigh in April (pain not same area).  Gets left knee pains when walks long distances, +sedentary lifestyle.   Still smoking 1 ppd daily.  Not watching carb diet either, eats fried foods at times, but states does not add salt.  Patient has No headache, No chest pain, No abdominal pain - No Nausea, No new weakness tingling or numbness, No Cough - SOB.  Problem  Fibromyalgia    ALLERGIES: Allergies  Allergen Reactions  . Pork-Derived Metallurgist    PAST MEDICAL HISTORY: Past Medical History  Diagnosis Date  . Hypertension   . Chronic pain syndrome   . Tobacco use   . MS (multiple sclerosis) ([place])   . Gout     MEDICATIONS AT HOME: Prior to Admission medications   Medication Sig Start Date End Date Taking? Authorizing Provider  atenolol (TENORMIN) 50 MG tablet Take 1 tablet (50 mg total) by mouth daily. 03/28/16   Maren Reamer, MD  Blood Glucose Monitoring Suppl (TRUE METRIX GO GLUCOSE METER) w/Device KIT 1 each by Does not apply route every 8 (eight) hours as needed. Patient not taking: Reported on 02/29/2016 02/02/16   Maren Reamer, MD  buPROPion [facility] SR) 150 MG 12 hr tablet Take 1 tablet (150 mg total) by mouth 2 (two) times daily. 03/28/16   Maren Reamer, MD  cyclobenzaprine (FLEXERIL) 10 MG tablet Take 1 tablet (10 mg total) by mouth 2 (two) times daily as needed for muscle spasms. 03/28/16   Maren Reamer, MD  diclofenac sodium (VOLTAREN) 1 % GEL Apply 2 g topically 4 (four) times  daily. 03/28/16   Maren Reamer, MD  Dimethyl Fumarate 120 & 240 MG MISC Take by mouth. Reported on 03/28/2016    Historical Provider, MD  DULoxetine (CYMBALTA) 30 MG capsule Take 1 capsule (30 mg total) by mouth daily. 03/28/16   Maren Reamer, MD  glucose blood test strip Use as instructed Patient not taking: Reported on 03/28/2016 02/02/16   Maren Reamer, MD  glyBURIDE (DIABETA) 5 MG tablet Take 1 tablet (5 mg total) by mouth daily with breakfast. 03/28/16   Maren Reamer, MD  losartan-hydrochlorothiazide (HYZAAR) 100-25 MG tablet Take 1 tablet by mouth daily. 03/28/16   Maren Reamer, MD  Melatonin 3 MG CAPS Take 1 capsule (3 mg total) by mouth at bedtime as needed. 03/28/16   Maren Reamer, MD  nystatin (NYSTATIN) powder Apply 5 g topically 3 (three) times daily. Under breast area Patient not taking: Reported on 03/28/2016 02/01/16   Maren Reamer, MD  TRUEPLUS LANCETS 26G MISC 1 each by Does not apply route every 8 (eight) hours as needed. Patient not taking: Reported on 02/29/2016 02/02/16   Maren Reamer, MD  Vitamin D, Ergocalciferol, (DRISDOL) 50000 UNITS CAPS capsule Take 1 capsule (50,000 Units total) by mouth every 7 (seven) days. Take 1 capsule by mouth every 7 days x 10 weeks Patient  not taking: Reported on 03/28/2016 08/03/15   [name], DO     Objective:   Filed Vitals:   03/28/16 1515  BP: 186/108  Pulse: 74  Temp: 98.3 F (36.8 C)  TempSrc: Oral  Weight: 251 lb (113.853 kg)    Exam General appearance : Awake, alert, not in any distress. Speech Clear. Not toxic looking, pleasant. HEENT: Atraumatic and Normocephalic, pupils equally reactive to light. Neck: supple, no JVD. No cervical lymphadenopathy.  Chest:Good air entry bilaterally, no added sounds. CVS: S1 S2 regular, no murmurs/gallups or rubs. Abdomen: Bowel sounds active, obese, Non tender and not distended with no gaurding, rigidity or rebound. Extremities: B/L Lower Ext shows no edema,  both legs are warm to touch, neg straight leg test bilaterally, mild ttp right quad region, no radicular findings. Neurology: Awake alert, and oriented X 3, CN II-XII grossly intact, Non focal Skin:No Rash  Data Review Lab Results  Component Value Date   HGBA1C 6.9* 02/01/2016    Depression screen PHQ 2/9 03/28/2016 02/01/2016  Decreased Interest 0 0  Down, Depressed, Hopeless 0 0  PHQ - 2 Score 0 0      Assessment & Plan   1. Essential hypertension Off all meds for about 1 month now, encouraged her to not miss meds, our pharmacy will work with her a payment plan, we do not want her to miss her meds. - atenolol (TENORMIN) 50 MG tablet; Take 1 tablet (50 mg total) by mouth daily.  Dispense: 90 tablet; Refill: 4 - losartan-hydrochlorothiazide (HYZAAR) 100-25 MG tablet; Take 1 tablet by mouth daily.  Dispense: 90 tablet; Refill: 4  2. Hyperglycemia w/ dm 2, new dx - never started metformin,  - chk bmp today, hold off on metformin for now - glyberide 63m qd - POCT glucose (manual entry) 90 - glucometer/supplies, reordered, never picked up last time  3. Chronic pain syndrome w/ underlying fibromyalgia - cyclobenzaprine (FLEXERIL) 10 MG tablet; Take 1 tablet (10 mg total) by mouth 2 (two) times daily as needed for muscle spasms.  Dispense: 60 tablet; Refill: 3 - DULoxetine (CYMBALTA) 30 MG capsule; Take 1 capsule (30 mg total) by mouth daily.  Dispense: 120 capsule; Refill: 3  4. Chronic kidney disease (CKD), stage IV (severe) (HCC) Recd tighter bp/dm control to prevent worsening disease. - BASIC METABOLIC PANEL WITH GFR  5. Noncompliance with medications Did not have money to pick up meds, encouraged her not to miss. Our pharmacy will work w/ her w/ payment plan, will even give her month supply w/o any money down so that she doesn't miss her meds!  6. Vitamin D deficiency rechk. - VITAMIN D 25 Hydroxy (Vit-D Deficiency, Fractures)  7. Elevated blood uric acid level Per pt,  does not recall gout hx, hx of elavated uric acid last year, will hold off on renewing atenolol for now, reck uric acid - Uric Acid  8. Cigarette nicotine dependence without complication Told her Pack cigs costing $4.5, about cost of 1 month supply meds, encouraged cessation, trial Welbutrin 150bid, which will help w/ this.  9. Obesity, unspecified Small frequent meals discussed, increase fiber intake, less white carbs,  Recd increase exercise, swimming is great low impact sport.  10. Fibromyalgia Renewed cymbalta.  11. Insomnia - trial melatonin 316mqhs, prn   Patient have been counseled extensively about nutrition and exercise  Return in about 4 weeks (around 04/25/2016) for htn, pap.  The patient was given clear instructions to go to ER or return  to medical center if symptoms don't improve, worsen or new problems develop. The patient verbalized understanding. The patient was told to call to get lab results if they haven't heard anything in the next week.   This note has been created with Surveyor, quantity. Any transcriptional errors are unintentional.   Maren Reamer, MD, [place] and [facility] [place], [place]   03/28/2016, 3:49 PM

## 2016-03-28 NOTE — Patient Instructions (Signed)
DASH Eating Plan DASH stands for "Dietary Approaches to Stop Hypertension." The DASH eating plan is a healthy eating plan that has been shown to reduce high blood pressure (hypertension). Additional health benefits may include reducing the risk of type 2 diabetes mellitus, heart disease, and stroke. The DASH eating plan may also help with weight loss. WHAT DO I NEED TO KNOW ABOUT THE DASH EATING PLAN? For the DASH eating plan, you will follow these general guidelines:  Choose foods with a percent daily value for sodium of less than 5% (as listed on the food label).  Use salt-free seasonings or herbs instead of table salt or sea salt.  Check with your health care provider or pharmacist before using salt substitutes.  Eat lower-sodium products, often labeled as "lower sodium" or "no salt added."  Eat fresh foods.  Eat more vegetables, fruits, and low-fat dairy products.  Choose whole grains. Look for the word "whole" as the first word in the ingredient list.  Choose fish and skinless chicken or turkey more often than red meat. Limit fish, poultry, and meat to 6 oz (170 g) each day.  Limit sweets, desserts, sugars, and sugary drinks.  Choose heart-healthy fats.  Limit cheese to 1 oz (28 g) per day.  Eat more home-cooked food and less restaurant, buffet, and fast food.  Limit fried foods.  Cook foods using methods other than frying.  Limit canned vegetables. If you do use them, rinse them well to decrease the sodium.  When eating at a restaurant, ask that your food be prepared with less salt, or no salt if possible. WHAT FOODS CAN I EAT? Seek help from a dietitian for individual calorie needs. Grains Whole grain or whole wheat bread. Brown rice. Whole grain or whole wheat pasta. Quinoa, bulgur, and whole grain cereals. Low-sodium cereals. Corn or whole wheat flour tortillas. Whole grain cornbread. Whole grain crackers. Low-sodium crackers. Vegetables Fresh or frozen vegetables  (raw, steamed, roasted, or grilled). Low-sodium or reduced-sodium tomato and vegetable juices. Low-sodium or reduced-sodium tomato sauce and paste. Low-sodium or reduced-sodium canned vegetables.  Fruits All fresh, canned (in natural juice), or frozen fruits. Meat and Other Protein Products Ground beef (85% or leaner), grass-fed beef, or beef trimmed of fat. Skinless chicken or turkey. Ground chicken or turkey. Pork trimmed of fat. All fish and seafood. Eggs. Dried beans, peas, or lentils. Unsalted nuts and seeds. Unsalted canned beans. Dairy Low-fat dairy products, such as skim or 1% milk, 2% or reduced-fat cheeses, low-fat ricotta or cottage cheese, or plain low-fat yogurt. Low-sodium or reduced-sodium cheeses. Fats and Oils Tub margarines without trans fats. Light or reduced-fat mayonnaise and salad dressings (reduced sodium). Avocado. Safflower, olive, or canola oils. Natural peanut or almond butter. Other Unsalted popcorn and pretzels. The items listed above may not be a complete list of recommended foods or beverages. Contact your dietitian for more options. WHAT FOODS ARE NOT RECOMMENDED? Grains White bread. White pasta. White rice. Refined cornbread. Bagels and croissants. Crackers that contain trans fat. Vegetables Creamed or fried vegetables. Vegetables in a cheese sauce. Regular canned vegetables. Regular canned tomato sauce and paste. Regular tomato and vegetable juices. Fruits Dried fruits. Canned fruit in light or heavy syrup. Fruit juice. Meat and Other Protein Products Fatty cuts of meat. Ribs, chicken wings, bacon, sausage, bologna, salami, chitterlings, fatback, hot dogs, bratwurst, and packaged luncheon meats. Salted nuts and seeds. Canned beans with salt. Dairy Whole or 2% milk, cream, half-and-half, and cream cheese. Whole-fat or sweetened yogurt. Full-fat   cheeses or blue cheese. Nondairy creamers and whipped toppings. Processed cheese, cheese spreads, or cheese  curds. Condiments Onion and garlic salt, seasoned salt, table salt, and sea salt. Canned and packaged gravies. Worcestershire sauce. Tartar sauce. Barbecue sauce. Teriyaki sauce. Soy sauce, including reduced sodium. Steak sauce. Fish sauce. Oyster sauce. Cocktail sauce. Horseradish. Ketchup and mustard. Meat flavorings and tenderizers. Bouillon cubes. Hot sauce. Tabasco sauce. Marinades. Taco seasonings. Relishes. Fats and Oils Butter, stick margarine, lard, shortening, ghee, and bacon fat. Coconut, palm kernel, or palm oils. Regular salad dressings. Other Pickles and olives. Salted popcorn and pretzels. The items listed above may not be a complete list of foods and beverages to avoid. Contact your dietitian for more information. WHERE CAN I FIND MORE INFORMATION? National Heart, Lung, and [employer] Institute: travelstabloid.com   This information is not intended to replace advice given to you by your health care provider. Make sure you discuss any questions you have with your health care provider.   Document Released: 08/18/2011 Document Revised: 09/19/2014 Document Reviewed: 07/03/2013 Elsevier Interactive Patient Education 2016 Else[employer] Inc.  - Diabetes Mellitus and Food It is important for you to manage your blood sugar (glucose) level. Your blood glucose level can be greatly affected by what you eat. Eating healthier foods in the appropriate amounts throughout the day at about the same time each day will help you control your blood glucose level. It can also help slow or prevent worsening of your diabetes mellitus. Healthy eating may even help you improve the level of your blood pressure and reach or maintain a healthy weight.  General recommendations for healthful eating and cooking habits include:  Eating meals and snacks regularly. Avoid going long periods of time without eating to lose weight.  Eating a diet that consists mainly of plant-based foods, such  as fruits, vegetables, nuts, legumes, and whole grains.  Using low-heat cooking methods, such as baking, instead of high-heat cooking methods, such as deep frying. Work with your dietitian to make sure you understand how to use the Nutrition Facts information on food labels. HOW CAN FOOD AFFECT ME? Carbohydrates Carbohydrates affect your blood glucose level more than any other type of food. Your dietitian will help you determine how many carbohydrates to eat at each meal and teach you how to count carbohydrates. Counting carbohydrates is important to keep your blood glucose at a healthy level, especially if you are using insulin or taking certain medicines for diabetes mellitus. Alcohol Alcohol can cause sudden decreases in blood glucose (hypoglycemia), especially if you use insulin or take certain medicines for diabetes mellitus. Hypoglycemia can be a life-threatening condition. Symptoms of hypoglycemia (sleepiness, dizziness, and disorientation) are similar to symptoms of having too much alcohol.  If your health care provider has given you approval to drink alcohol, do so in moderation and use the following guidelines:  Women should not have more than one drink per day, and men should not have more than two drinks per day. One drink is equal to:  12 oz of beer.  5 oz of wine.  1 oz of hard liquor.  Do not drink on an empty stomach.  Keep yourself hydrated. Have water, diet soda, or unsweetened iced tea.  Regular soda, juice, and other mixers might contain a lot of carbohydrates and should be counted. WHAT FOODS ARE NOT RECOMMENDED? As you make food choices, it is important to remember that all foods are not the same. Some foods have fewer nutrients per serving than other foods, even  though they might have the same number of calories or carbohydrates. It is difficult to get your body what it needs when you eat foods with fewer nutrients. Examples of foods that you should avoid that are  high in calories and carbohydrates but low in nutrients include:  Trans fats (most processed foods list trans fats on the Nutrition Facts label).  Regular soda.  Juice.  Candy.  Sweets, such as cake, pie, doughnuts, and cookies.  Fried foods. WHAT FOODS CAN I EAT? Eat nutrient-rich foods, which will nourish your body and keep you healthy. The food you should eat also will depend on several factors, including:  The calories you need.  The medicines you take.  Your weight.  Your blood glucose level.  Your blood pressure level.  Your cholesterol level. You should eat a variety of foods, including:  Protein.  Lean cuts of meat.  Proteins low in saturated fats, such as fish, egg whites, and beans. Avoid processed meats.  Fruits and vegetables.  Fruits and vegetables that may help control blood glucose levels, such as apples, mangoes, and yams.  Dairy products.  Choose fat-free or low-fat dairy products, such as milk, yogurt, and cheese.  Grains, bread, pasta, and rice.  Choose whole grain products, such as multigrain bread, whole oats, and brown rice. These foods may help control blood pressure.  Fats.  Foods containing healthful fats, such as nuts, avocado, olive oil, canola oil, and fish. DOES EVERYONE WITH DIABETES MELLITUS HAVE THE SAME MEAL PLAN? Because every person with diabetes mellitus is different, there is not one meal plan that works for everyone. It is very important that you meet with a dietitian who will help you create a meal plan that is just right for you.   This information is not intended to replace advice given to you by your health care provider. Make sure you discuss any questions you have with your health care provider.   Document Released: 05/26/2005 Document Revised: 09/19/2014 Document Reviewed: 07/26/2013 Elsevier Interactive Patient Education 2016 [place] for Eating Away From Home If You Have Diabetes Controlling your  level of blood glucose, also known as blood sugar, can be challenging. It can be even more difficult when you do not prepare your own meals. The following tips can help you manage your diabetes when you eat away from home. PLANNING AHEAD Plan ahead if you know you will be eating away from home:  Ask your health care provider how to time meals and medicine if you are taking insulin.  Make a list of restaurants near you that offer healthy choices. If they have a carry-out menu, take it home and plan what you will order ahead of time.  Look up the restaurant you want to eat at online. Many chain and fast-food restaurants list nutritional information online. Use this information to choose the healthiest options and to calculate how many carbohydrates will be in your meal.  Use a carbohydrate-counting book or mobile app to look up the carbohydrate content and serving size of the foods you want to eat.  Become familiar with serving sizes and learn to recognize how many servings are in a portion. This will allow you to estimate how many carbohydrates you can eat. FREE FOODS A "free food" is any food or drink that has less than 5 g of carbohydrates per serving. Free foods include:  Many vegetables.  Hard boiled eggs.  Nuts or seeds.  Olives.  Cheeses.  Meats. These  types of foods make good appetizer choices and are often available at salad bars. Lemon juice, vinegar, or a low-calorie salad dressing of fewer than 20 calories per serving can be used as a "free" salad dressing.  CHOICES TO REDUCE CARBOHYDRATES  Substitute nonfat sweetened yogurt with a sugar-free yogurt. Yogurt made from soy milk may also be used, but you will still want a sugar-free or plain option to choose a lower carbohydrate amount.  Ask your server to take away the bread basket or chips from your table.  Order fresh fruit. A salad bar often offers fresh fruit choices. Avoid canned fruit because it is usually packed in  sugar or syrup.  Order a salad, and eat it without dressing. Or, create a "free" salad dressing.  Ask for substitutions. For example, instead of [place] fries, request an order of a vegetable such as salad, green beans, or broccoli. OTHER TIPS   If you take insulin, take the insulin once your food arrives to your table. This will ensure your insulin and food are timed correctly.  Ask your server about the portion size before your order, and ask for a take-out box if the portion has more servings than you should have. When your food comes, leave the amount you should have on the plate, and put the rest in the take-out box.  Consider splitting an entree with someone and ordering a side salad.   This information is not intended to replace advice given to you by your health care provider. Make sure you discuss any questions you have with your health care provider.   Document Released: 08/29/2005 Document Revised: 05/20/2015 Document Reviewed: 11/26/2013 Elsevier Interactive Patient Education 2016 [employer].  - Diabetes and Exercise Exercising regularly is important. It is not just about losing weight. It has many health benefits, such as:  Improving your overall fitness, flexibility, and endurance.  Increasing your bone density.  Helping with weight control.  Decreasing your body fat.  Increasing your muscle strength.  Reducing stress and tension.  Improving your overall health. People with diabetes who exercise gain additional benefits because exercise:  Reduces appetite.  Improves the body's use of blood sugar (glucose).  Helps lower or control blood glucose.  Decreases blood pressure.  Helps control blood lipids (such as cholesterol and triglycerides).  Improves the body's use of the hormone insulin by:  Increasing the body's insulin sensitivity.  Reducing the body's insulin needs.  Decreases the risk for heart disease because exercising:  Lowers cholesterol  and triglycerides levels.  Increases the levels of good cholesterol (such as high-density lipoproteins [HDL]) in the body.  Lowers blood glucose levels. YOUR ACTIVITY PLAN  Choose an activity that you enjoy, and set realistic goals. To exercise safely, you should begin practicing any new physical activity slowly, and gradually increase the intensity of the exercise over time. Your health care provider or diabetes educator can help create an activity plan that works for you. General recommendations include:  Encouraging children to engage in at least 60 minutes of physical activity each day.  Stretching and performing strength training exercises, such as yoga or weight lifting, at least 2 times per week.  Performing a total of at least 150 minutes of moderate-intensity exercise each week, such as brisk walking or water aerobics.  Exercising at least 3 days per week, making sure you allow no more than 2 consecutive days to pass without exercising.  Avoiding long periods of inactivity (90 minutes or more). When you have to  spend an extended period of time sitting down, take frequent breaks to walk or stretch. RECOMMENDATIONS FOR EXERCISING WITH TYPE 1 OR TYPE 2 DIABETES   Check your blood glucose before exercising. If blood glucose levels are greater than 240 mg/dL, check for urine ketones. Do not exercise if ketones are present.  Avoid injecting insulin into areas of the body that are going to be exercised. For example, avoid injecting insulin into:  The arms when playing tennis.  The legs when jogging.  Keep a record of:  Food intake before and after you exercise.  Expected peak times of insulin action.  Blood glucose levels before and after you exercise.  The type and amount of exercise you have done.  Review your records with your health care provider. Your health care provider will help you to develop guidelines for adjusting food intake and insulin amounts before and after  exercising.  If you take insulin or oral hypoglycemic agents, watch for signs and symptoms of hypoglycemia. They include:  Dizziness.  Shaking.  Sweating.  Chills.  Confusion.  Drink plenty of water while you exercise to prevent dehydration or heat stroke. Body water is lost during exercise and must be replaced.  Talk to your health care provider before starting an exercise program to make sure it is safe for you. Remember, almost any type of activity is better than none.   This information is not intended to replace advice given to you by your health care provider. Make sure you discuss any questions you have with your health care provider.   Document Released: 11/19/2003 Document Revised: 01/13/2015 Document Reviewed: 02/05/2013 Elsevier Interactive Patient Education 2016 [place] Can Quit Smoking If you are ready to quit smoking or are thinking about it, congratulations! You have chosen to help yourself be healthier and live longer! There are lots of different ways to quit smoking. Nicotine gum, nicotine patches, a nicotine inhaler, or nicotine nasal spray can help with physical craving. Hypnosis, support groups, and medicines help break the habit of smoking. TIPS TO GET OFF AND STAY OFF CIGARETTES  Learn to predict your moods. Do not let a bad situation be your excuse to have a cigarette. Some situations in your life might tempt you to have a cigarette.  Ask friends and co-workers not to smoke around you.  Make your home smoke-free.  Never have "just one" cigarette. It leads to wanting another and another. Remind yourself of your decision to quit.  On a card, make a list of your reasons for not smoking. Read it at least the same number of times a day as you have a cigarette. Tell yourself everyday, "I do not want to smoke. I choose not to smoke."  Ask someone at home or work to help you with your plan to quit smoking.  Have something planned after you eat or  have a cup of coffee. Take a walk or get other exercise to perk you up. This will help to keep you from overeating.  Try a relaxation exercise to calm you down and decrease your stress. Remember, you may be tense and nervous the first two weeks after you quit. This will pass.  Find new activities to keep your hands busy. Play with a pen, coin, or rubber band. Doodle or draw things on paper.  Brush your teeth right after eating. This will help cut down the craving for the taste of tobacco after meals. You can try mouthwash too.  Try  gum, breath mints, or diet candy to keep something in your mouth. IF YOU SMOKE AND WANT TO QUIT:  Do not stock up on cigarettes. Never buy a carton. Wait until one pack is finished before you buy another.  Never carry cigarettes with you at work or at home.  Keep cigarettes as far away from you as possible. Leave them with someone else.  Never carry matches or a lighter with you.  Ask yourself, "Do I need this cigarette or is this just a reflex?"  Bet with someone that you can quit. Put cigarette money in a piggy bank every morning. If you smoke, you give up the money. If you do not smoke, by the end of the week, you keep the money.  Keep trying. It takes 21 days to change a habit!  Talk to your doctor about using medicines to help you quit. These include nicotine replacement gum, lozenges, or skin patches.   This information is not intended to replace advice given to you by your health care provider. Make sure you discuss any questions you have with your health care provider.   Document Released: 06/25/2009 Document Revised: 11/21/2011 Document Reviewed: 06/25/2009 Elsevier Interactive Patient Education [employer].

## 2016-03-29 ENCOUNTER — Other Ambulatory Visit: Payer: Self-pay | Admitting: Internal Medicine

## 2016-03-29 ENCOUNTER — Ambulatory Visit: Payer: Self-pay | Attending: Internal Medicine | Admitting: Pharmacist

## 2016-03-29 DIAGNOSIS — E118 Type 2 diabetes mellitus with unspecified complications: Secondary | ICD-10-CM

## 2016-03-29 DIAGNOSIS — E119 Type 2 diabetes mellitus without complications: Secondary | ICD-10-CM | POA: Insufficient documentation

## 2016-03-29 LAB — VITAMIN D 25 HYDROXY (VIT D DEFICIENCY, FRACTURES): VIT D 25 HYDROXY: 20 ng/mL — AB (ref 30–100)

## 2016-03-29 MED ORDER — VITAMIN D (ERGOCALCIFEROL) 1.25 MG (50000 UNIT) PO CAPS: 50000.0000 [IU] | ORAL_CAPSULE | ORAL | Status: DC

## 2016-03-29 NOTE — Progress Notes (Signed)
Patient arrived to clinic for diabetic blood glucose monitoring education.  Patient has the True Metrix Device.  Patient was educated on the use of the device and was able to demonstrate use. All questions were addressed.  Patient to follow up with Dr. Janne Napoleon.

## 2016-03-29 NOTE — Patient Instructions (Signed)
Blood Glucose Monitoring, Adult ° °Monitoring your blood glucose (also know as blood sugar) helps you to manage your diabetes. It also helps you and your health care provider monitor your diabetes and determine how well your treatment plan is working. °WHY SHOULD YOU MONITOR YOUR BLOOD GLUCOSE? °· It can help you understand how food, exercise, and medicine affect your blood glucose. °· It allows you to know what your blood glucose is at any given moment. You can quickly tell if you are having low blood glucose (hypoglycemia) or high blood glucose (hyperglycemia). °· It can help you and your health care provider know how to adjust your medicines. °· It can help you understand how to manage an illness or adjust medicine for exercise. °WHEN SHOULD YOU TEST? °Your health care provider will help you decide how often you should check your blood glucose. This may depend on the type of diabetes you have, your diabetes control, or the types of medicines you are taking. Be sure to write down all of your blood glucose readings so that this information can be reviewed with your health care provider. See below for examples of testing times that your health care provider may suggest. °Type 1 Diabetes °· Test at least 2 times per day if your diabetes is well controlled, if you are using an insulin pump, or if you perform multiple daily injections. °· If your diabetes is not well controlled or if you are sick, you may need to test more often. °· It is a good idea to also test: °¨ Before every insulin injection. °¨ Before and after exercise. °¨ Between meals and 2 hours after a meal. °¨ Occasionally between 2:00 a.m. and 3:00 a.m. °Type 2 Diabetes °· If you are taking insulin, test at least 2 times per day. However, it is best to test before every insulin injection. °· If you take medicines by mouth (orally), test 2 times a day. °· If you are on a controlled diet, test once a day. °· If your diabetes is not well controlled or if you  are sick, you may need to monitor more often. °HOW TO MONITOR YOUR BLOOD GLUCOSE °Supplies Needed °· Blood glucose meter. °· Test strips for your meter. Each meter has its own strips. You must use the strips that go with your own meter. °· A pricking needle (lancet). °· A device that holds the lancet (lancing device). °· A journal or log book to write down your results. °Procedure °· Wash your hands with soap and water. Alcohol is not preferred. °· Prick the side of your finger (not the tip) with the lancet. °· Gently milk the finger until a small drop of blood appears. °· Follow the instructions that come with your meter for inserting the test strip, applying blood to the strip, and using your blood glucose meter. °Other Areas to Get Blood for Testing °Some meters allow you to use other areas of your body (other than your finger) to test your blood. These areas are called alternative sites. The most common alternative sites are: °· The forearm. °· The thigh. °· The back area of the lower leg. °· The palm of the hand. °The blood flow in these areas is slower. Therefore, the blood glucose values you get may be delayed, and the numbers are different from what you would get from your fingers. Do not use alternative sites if you think you are having hypoglycemia. Your reading will not be accurate. Always use a finger if you   are having hypoglycemia. Also, if you cannot feel your lows (hypoglycemia unawareness), always use your fingers for your blood glucose checks. °ADDITIONAL TIPS FOR GLUCOSE MONITORING °· Do not reuse lancets. °· Always carry your supplies with you. °· All blood glucose meters have a 24-hour "hotline" number to call if you have questions or need help. °· Adjust (calibrate) your blood glucose meter with a control solution after finishing a few boxes of strips. °BLOOD GLUCOSE RECORD KEEPING °It is a good idea to keep a daily record or log of your blood glucose readings. Most glucose meters, if not all,  keep your glucose records stored in the meter. Some meters come with the ability to download your records to your home computer. Keeping a record of your blood glucose readings is especially helpful if you are wanting to look for patterns. Make notes to go along with the blood glucose readings because you might forget what happened at that exact time. Keeping good records helps you and your health care provider to work together to achieve good diabetes management.  °  °This information is not intended to replace advice given to you by your health care provider. Make sure you discuss any questions you have with your health care provider. °  °Document Released: 09/01/2003 Document Revised: 09/19/2014 Document Reviewed: 01/21/2013 °Elsevier Interactive Patient Education ©2016 Elsevier Inc. ° °

## 2016-03-31 ENCOUNTER — Telehealth: Payer: Self-pay

## 2016-03-31 NOTE — Telephone Encounter (Signed)
-----   Message from Maren Reamer, MD sent at 03/29/2016  1:32 PM EDT ----- Please call, your vit d remains low, but not as bade as few years ago.  rx for vit d placed, Very low vit d, can cause bone/muscle pain.  rx for Vit D replacement in chart, take weekly and will rechk levels in 3 months.  If rx too expensive, can buy OTC vit D of 1000units and take daily as well (but it may actually cost more than rx and not as effective).  Kidney function remains the same, w/ chronic kidney disease stage 3.  Need to take the bp meds and dm meds to prevent this from getting worse. Once we get to ckd stage 4-5 , we are talking about dialysis.   No signs of gout on labs. thanks

## 2016-03-31 NOTE — Telephone Encounter (Signed)
Clld pt  - advsd of lab results; Rx Vit D; importance of taking BP and DM medications to prevent CKD Stage 3 from getting worse. Pt stated she understood.

## 2016-04-18 ENCOUNTER — Telehealth: Payer: Self-pay | Admitting: Internal Medicine

## 2016-04-18 NOTE — Telephone Encounter (Signed)
Will forward to pcp

## 2016-04-18 NOTE — Telephone Encounter (Signed)
Called pt, confirmed dob.  Feels cbg dropping sometimes now that she is taking metfomin 500bid and glyburide 5mg  daily. No diarrhea. Does not check her cbg prior to eating something for the "low sugar".  No presyncope/syncope.  Recd decrease metformin to 500 qday and cont glyburide 5mg  qd for now. Recd chking her cbg  Next time she feels like it dropping as well.  Has appt this coming 8/27. Answered all questions.

## 2016-04-18 NOTE — Telephone Encounter (Signed)
Pt states her blood sugar has been fluctuating  Pt wants to talk to nurse to see what actions she can take to help this issue

## 2016-04-26 ENCOUNTER — Ambulatory Visit
Admission: RE | Admit: 2016-04-26 | Discharge: 2016-04-26 | Disposition: A | Discharge status: Home / Self Care | Payer: No Typology Code available for payment source | Source: Ambulatory Visit | Attending: Internal Medicine | Admitting: Internal Medicine

## 2016-04-26 DIAGNOSIS — Z1231 Encounter for screening mammogram for malignant neoplasm of breast: Secondary | ICD-10-CM

## 2016-04-28 ENCOUNTER — Other Ambulatory Visit: Payer: Self-pay | Admitting: Internal Medicine

## 2016-04-28 MED ORDER — METOPROLOL TARTRATE 25 MG PO TABS: 25.0000 mg | ORAL_TABLET | Freq: Two times a day (BID) | ORAL | 3 refills | Status: DC

## 2016-04-28 MED FILL — glyBURIDE 5 MG TABS: 5 | 30 days supply | Qty: 30 | Fill #1

## 2016-04-28 MED FILL — CYCLOBENZAPRINE 10 MG TAB: 10 | 30 days supply | Qty: 60 | Fill #0

## 2016-04-28 MED FILL — BUPROPION HCL SR 150 MG TAB: 150 | 30 days supply | Qty: 60 | Fill #0

## 2016-04-28 MED FILL — ?LOSARTAN-HCTZ 100-25 MG TA: 100MG-25MG | 30 days supply | Qty: 30 | Fill #0

## 2016-04-28 MED FILL — METOPROLOL TARTRATE 25 MG T: 25 | 30 days supply | Qty: 60 | Fill #0

## 2016-04-28 MED FILL — VIT D2 1.25 MG (50,000 UNIT: 1.25 MG | 90 days supply | Qty: 12 | Fill #0

## 2016-05-03 ENCOUNTER — Other Ambulatory Visit: Payer: Self-pay | Admitting: Internal Medicine

## 2016-05-05 ENCOUNTER — Telehealth: Payer: Self-pay

## 2016-05-05 NOTE — Telephone Encounter (Signed)
Contacted pt to go over mammogram results pt is aware and doesn't have any questions or concerns

## 2016-05-16 ENCOUNTER — Encounter (HOSPITAL_COMMUNITY): Payer: Self-pay | Admitting: Student-PharmD

## 2016-06-10 ENCOUNTER — Telehealth: Payer: Self-pay | Admitting: Internal Medicine

## 2016-06-10 NOTE — Telephone Encounter (Signed)
Patient called to report that losartsan-hydrochlorothiazide is making her dizzy. Patient would like advice on what to do.  Please follow up.

## 2016-06-10 NOTE — Telephone Encounter (Signed)
Contacted pt to make her aware to stop the medication and I will make Dr. Janne Napoleon aware. Also pt states she is suppose to pick up some medication I informed pt to pick up her medication but not the losartan if its making her dizzy. Pt states the medication makes her dizzy all the time

## 2016-06-13 NOTE — Telephone Encounter (Signed)
Please ask her to make appt w/ me soon for bp chk and readdress meds. thx

## 2016-06-13 NOTE — Telephone Encounter (Signed)
Please contact pt and schedule an appointment with Dr. Janne Napoleon

## 2016-06-21 ENCOUNTER — Telehealth: Payer: Self-pay | Admitting: Neurology

## 2016-06-21 NOTE — Telephone Encounter (Signed)
Patient wants to talk to someone about medication please call [phone]

## 2016-06-21 NOTE — Telephone Encounter (Signed)
Attempted to reach pt. Home line disconnected. Mobile had no VM set up.

## 2016-06-22 NOTE — Telephone Encounter (Signed)
Attempted to reach pt again. Again phone rang and rang then disconnected. Will close encounter at this time.

## 2016-07-06 ENCOUNTER — Ambulatory Visit: Payer: Self-pay | Admitting: Internal Medicine

## 2016-08-08 ENCOUNTER — Ambulatory Visit: Payer: Self-pay | Admitting: Neurology

## 2016-08-11 ENCOUNTER — Inpatient Hospital Stay
Admit: 2016-08-11 | Discharge: 2016-08-11 | Disposition: A | Discharge status: Home / Self Care | Payer: MEDICAID | Attending: Emergency Medicine

## 2016-08-11 ENCOUNTER — Emergency Department: Admit: 2016-08-11 | Payer: MEDICAID

## 2016-08-11 DIAGNOSIS — M25511 Pain in right shoulder: Secondary | ICD-10-CM

## 2016-08-11 MED ORDER — TRAMADOL 50 MG TAB
50 mg | ORAL_TABLET | Freq: Four times a day (QID) | ORAL | 0 refills | Status: AC | PRN
Start: 2016-08-11 — End: ?

## 2016-08-11 MED ORDER — TRAMADOL 50 MG TAB
50 mg | ORAL | Status: AC
Start: 2016-08-11 — End: 2016-08-11
  Administered 2016-08-11: 15:00:00 via ORAL

## 2016-08-11 MED FILL — TRAMADOL 50 MG TAB: 50 mg | ORAL | Qty: 1

## 2016-08-11 NOTE — ED Notes (Signed)
Pt medicated per MAR. No needs expressed. Pt calm and cooperative.
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
## 2016-08-11 NOTE — ED Notes (Signed)
Pt to X-Ray
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
## 2016-08-12 ENCOUNTER — Encounter: Payer: Self-pay | Admitting: Neurology

## 2017-02-20 ENCOUNTER — Ambulatory Visit: Payer: Self-pay | Admitting: Family Medicine

## 2017-02-21 ENCOUNTER — Encounter (HOSPITAL_COMMUNITY): Payer: Self-pay | Admitting: Emergency Medicine

## 2017-02-21 ENCOUNTER — Emergency Department (HOSPITAL_COMMUNITY)
Admission: EM | Admit: 2017-02-21 | Discharge: 2017-02-21 | Disposition: A | Discharge status: Home / Self Care | Payer: Medicare Other | Attending: Emergency Medicine | Admitting: Emergency Medicine

## 2017-02-21 DIAGNOSIS — I959 Hypotension, unspecified: Secondary | ICD-10-CM | POA: Diagnosis not present

## 2017-02-21 DIAGNOSIS — E119 Type 2 diabetes mellitus without complications: Secondary | ICD-10-CM | POA: Insufficient documentation

## 2017-02-21 DIAGNOSIS — I10 Essential (primary) hypertension: Secondary | ICD-10-CM | POA: Insufficient documentation

## 2017-02-21 DIAGNOSIS — Z79899 Other long term (current) drug therapy: Secondary | ICD-10-CM | POA: Diagnosis not present

## 2017-02-21 DIAGNOSIS — F17210 Nicotine dependence, cigarettes, uncomplicated: Secondary | ICD-10-CM | POA: Diagnosis not present

## 2017-02-21 LAB — I-STAT CHEM 8, ED
BUN: 21 mg/dL — ABNORMAL HIGH (ref 6–20)
CALCIUM ION: 1.17 mmol/L (ref 1.15–1.40)
CREATININE: 1.4 mg/dL — AB (ref 0.44–1.00)
Chloride: 104 mmol/L (ref 101–111)
GLUCOSE: 102 mg/dL — AB (ref 65–99)
HCT: 51 % — ABNORMAL HIGH (ref 36.0–46.0)
HEMOGLOBIN: 17.3 g/dL — AB (ref 12.0–15.0)
POTASSIUM: 4.3 mmol/L (ref 3.5–5.1)
Sodium: 141 mmol/L (ref 135–145)
TCO2: 28 mmol/L (ref 0–100)

## 2017-02-21 MED ORDER — ONDANSETRON 4 MG PO TBDP
ORAL_TABLET | ORAL | Status: AC
  Administered 2017-02-21: 15:00:00
  Filled 2017-02-21: qty 1

## 2017-02-21 MED ORDER — HYDRALAZINE HCL 10 MG PO TABS: 10.0000 mg | ORAL_TABLET | Freq: Once | ORAL | Status: DC

## 2017-02-21 MED ORDER — LISINOPRIL 10 MG PO TABS
10.0000 mg | ORAL_TABLET | Freq: Every day | ORAL | 0 refills | Status: DC
Start: 2017-02-21 — End: 2017-04-04

## 2017-02-21 MED ORDER — ONDANSETRON 4 MG PO TBDP: 4.0000 mg | ORAL_TABLET | Freq: Once | ORAL | Status: DC | PRN

## 2017-02-21 NOTE — ED Provider Notes (Signed)
[facility] DEPT Provider Note   CSN: [id number] Arrival date & time: 02/21/17  1443     History   Chief Complaint Chief Complaint  Patient presents with  . Hypertension  . Shoulder Pain  . Leg Pain    HPI Tiffany Brady is a 56 y.o. female.  The history is provided by the patient. No language interpreter was used.  Hypertension  Chronicity: Unknown chronicity. The problem occurs constantly. The problem has not changed since onset.Associated symptoms include headaches (chronic headaches from MS). Pertinent negatives include no chest pain, no abdominal pain and no shortness of breath. Nothing aggravates the symptoms. Nothing relieves the symptoms. Treatments tried: noncompliant with HTN meds due to poor followup.    Past Medical History:  Diagnosis Date  . Chronic pain syndrome   . Gout   . Hypertension   . MS (multiple sclerosis) (Tiffany Brady)   . Tobacco use     Patient Active Problem List   Diagnosis Date Noted  . Fibromyalgia 03/28/2016  . Diabetes mellitus (Tiffany Brady) 03/28/2016  . Multiple sclerosis (Tiffany Brady) 11/02/2015  . Smoking 04/24/2014  . Back muscle spasm 01/28/2014  . Migraine headache 07/23/2013  . Essential hypertension, benign 06/27/2013  . Chronic pain syndrome 06/27/2013  . Obesity, unspecified 06/27/2013  . Nicotine dependence 06/27/2013    Past Surgical History:  Procedure Laterality Date  . FOOT SURGERY Right     OB History    No data available       Home Medications    Prior to Admission medications   Medication Sig Start Date End Date Taking? Authorizing Provider  acetaminophen (TYLENOL) 500 MG tablet Take 500 mg by mouth every 6 (six) hours as needed for mild pain.   Yes [provider]  Blood Glucose Monitoring Suppl (TRUE METRIX GO GLUCOSE METER) w/Device KIT 1 each by Does not apply route every 8 (eight) hours as needed. 03/28/16  Yes Langeland, Dawn T, MD  cyclobenzaprine (FLEXERIL) 10 MG tablet Take 1 tablet (10 mg total) by  mouth 2 (two) times daily as needed for muscle spasms. 03/28/16  Yes Langeland, Dawn T, MD  diclofenac sodium (VOLTAREN) 1 % GEL Apply 2 g topically 4 (four) times daily. 03/28/16  Yes Langeland, Dawn T, MD  Dimethyl Fumarate (TECFIDERA) 240 MG CPDR Take 240 mg by mouth 2 (two) times daily.   Yes [provider]  Dimethyl Fumarate 120 & 240 MG MISC Take by mouth. Reported on 03/28/2016   Yes [provider]  DULoxetine (CYMBALTA) 60 MG capsule Take 60 mg by mouth daily.   Yes [provider]  glucose blood test strip Use as instructed 03/28/16  Yes Langeland, Dawn T, MD  meloxicam (MOBIC) 7.5 MG tablet Take 7.5 mg by mouth daily.   Yes [provider]  nystatin (NYSTATIN) powder Apply 5 g topically 3 (three) times daily. Under breast area 02/01/16  Yes Langeland, Dawn T, MD  TRUEPLUS LANCETS 26G MISC 1 each by Does not apply route every 8 (eight) hours as needed. 03/28/16  Yes Langeland, Dawn T, MD  atenolol (TENORMIN) 50 MG tablet Take 1 tablet (50 mg total) by mouth daily. Patient not taking: Reported on 02/21/2017 03/28/16   [name], MD  buPROPion [facility] [employer] SR) 150 MG 12 hr tablet Take 1 tablet (150 mg total) by mouth 2 (two) times daily. Patient not taking: Reported on 02/21/2017 03/28/16   [name], MD  DULoxetine (CYMBALTA) 30 MG capsule Take 1 capsule (30 mg total) by  mouth daily. Patient not taking: Reported on 02/21/2017 03/28/16   Tiffany Reamer, MD  glyBURIDE (DIABETA) 5 MG tablet Take 1 tablet (5 mg total) by mouth daily with breakfast. 03/28/16   Tiffany Reamer, MD  lisinopril (PRINIVIL,ZESTRIL) 10 MG tablet Take 1 tablet (10 mg total) by mouth daily. 02/21/17   Tiffany Emerald, MD  losartan-hydrochlorothiazide (HYZAAR) 100-25 MG tablet Take 1 tablet by mouth daily. Patient not taking: Reported on 02/21/2017 03/28/16   Tiffany Reamer, MD  Melatonin 3 MG CAPS Take 1 capsule (3 mg total) by mouth at bedtime as needed. Patient not taking:  Reported on 02/21/2017 03/28/16   [name], MD  metoprolol tartrate (LOPRESSOR) 25 MG tablet Take 1 tablet (25 mg total) by mouth 2 (two) times daily. Patient not taking: Reported on 02/21/2017 04/28/16   Tresa Garter, MD  Vitamin D, Ergocalciferol, (DRISDOL) 50000 units CAPS capsule Take 1 capsule (50,000 Units total) by mouth every 7 (seven) days. Patient not taking: Reported on 02/21/2017 03/29/16   Tiffany Reamer, MD    Family History Family History  Problem Relation Age of Onset  . Cancer Mother        Deceased  . Hypertension Mother   . Other Father        Deceased, 11  . HIV/AIDS Brother        Deceased  . Multiple sclerosis Daughter     Social History Social History  Substance Use Topics  . Smoking status: Current Every Day Smoker    Packs/day: 0.75    Years: 40.00    Types: Cigarettes  . Smokeless tobacco: Never Used  . Alcohol use 0.0 oz/week     Comment: occasional     Allergies   Pork-derived products   Review of Systems Review of Systems  Constitutional: Negative for chills and fever.  HENT: Negative for ear pain and sore throat.   Eyes: Negative for pain and visual disturbance.  Respiratory: Negative for cough and shortness of breath.   Cardiovascular: Negative for chest pain and palpitations.  Gastrointestinal: Negative for abdominal pain and vomiting.  Genitourinary: Negative for dysuria and hematuria.  Musculoskeletal: Negative for arthralgias and back pain.       Positive for diffuse myalgias from fibromyalgia  Skin: Negative for color change and rash.  Neurological: Positive for headaches (chronic headaches from MS). Negative for seizures and syncope.  All other systems reviewed and are negative.    Physical Exam Updated Vital Signs BP (!) 182/97   Pulse 69   Temp 97.6 F (36.4 C) (Oral)   Resp 19   Ht _0  (1.626 m)   Wt 117.5 kg (259 lb)   LMP 04/21/2014   SpO2 96%   BMI 44.46 kg/m   Physical Exam    Constitutional: She appears well-developed and well-nourished. No distress.  HENT:  Head: Normocephalic and atraumatic.  Eyes: Conjunctivae are normal.  Neck: Neck supple.  Cardiovascular: Normal rate and regular rhythm.   No murmur heard. Pulmonary/Chest: Effort normal and breath sounds normal. No respiratory distress.  Abdominal: Soft. There is no tenderness.  Musculoskeletal: She exhibits no edema.  Neurological: She is alert. No cranial nerve deficit. Coordination normal.  5/5 motor strength and intact sensation in all extremities. Finger-to-nose intact bilaterally  Skin: Skin is warm and dry.  Nursing note and vitals reviewed.    ED Treatments / Results  Labs (all labs ordered are listed, but only abnormal results are displayed) Labs Reviewed  I-STAT CHEM 8, ED - Abnormal; Notable for the following:       Result Value   BUN 21 (*)    Creatinine, Ser 1.40 (*)    Glucose, Bld 102 (*)    Hemoglobin 17.3 (*)    HCT 51.0 (*)    All other components within normal limits    EKG  EKG Interpretation None       Radiology No results found.  Procedures Procedures (including critical care time)  Medications Ordered in ED Medications  ondansetron (ZOFRAN-ODT) 4 MG disintegrating tablet (  Given 02/21/17 1507)     Initial Impression / Assessment and Plan / ED Course  I have reviewed the triage vital signs and the nursing notes.  Pertinent labs & imaging results that were available during my care of the patient were reviewed by me and considered in my medical decision making (see chart for details).     56 year old female history of MS, hypertension, fibromyalgia, diabetes, tobacco abuse who presents from senior living facility for hypertension. Patient reports systolic BP in 175F when checked at her facility.  She is not sure what her baseline blood pressure is. She has been rationing her lisinopril and other medications due to not yet receiving approval for medical  insurance. She moved to [place] briefly for medical evaluation but returned within the past several weeks. She has not been re-established with her [facility] doctors. She reports chronic pain from fibomyalgia. She had a headache earlier today that was less painful than her typical MS headaches. She denies other symptoms.  AF, VSS. No focal neuro deficits. Lungs CTAB. Abdomen soft, benign throughout.  Chem 8 unremarkable. BP remains 160-180. No acute symptoms or signs of end-organ damage. Suspect asymptomatic hypertension. Pt ambulating well and tolerating PO. She will f/u closely with PCP.   Return precautions provided for worsening symptoms. Pt will f/u with PCP at first availability. Pt verbalized agreement with plan.  Pt care d/w Dr. Zenia Resides  Final Clinical Impressions(s) / ED Diagnoses   Final diagnoses:  Asymptomatic hypertension    New Prescriptions Discharge Medication List as of 02/21/2017  7:27 PM       Renaldo Gornick, Pilar Plate, MD 02/22/17 1032

## 2017-02-21 NOTE — ED Notes (Signed)
Now endorses headache and nausea.

## 2017-02-21 NOTE — ED Provider Notes (Signed)
I saw and evaluated the patient, reviewed the resident's note and I agree with the findings and plan.   EKG Interpretation None     Patient here complaining of hypertension that was diagnosed at her senior living facility. Patient does have a history of same but is been noncompliant with her medication. Denies any chest pain or shortness of breath. Mild headache but no confusion or emesis. Will check electrolytes here including renal function and consider treating blood pressure acutely   Lacretia Leigh, MD 02/21/17 1719

## 2017-02-21 NOTE — ED Triage Notes (Signed)
Pt to ER for hypertension, states [facility] and it was "high." States she has felt jittery this week. NAD. States bilateral shoulder and leg pain, states has MS.

## 2017-03-21 MED FILL — ?BUPROPION HCL SR 150 MG TA: 150 | 30 days supply | Qty: 60 | Fill #1

## 2017-03-21 MED FILL — METOPROLOL TARTRATE 25 MG T: 25 | 30 days supply | Qty: 60 | Fill #1

## 2017-03-21 MED FILL — ?DULOXETINE HCL DR 30 MG CA: 30 MG | 30 days supply | Qty: 30 | Fill #0

## 2017-03-21 MED FILL — ?CYCLOBENZAPRINE 10 MG TABL: 10 | 30 days supply | Qty: 60 | Fill #1

## 2017-03-21 MED FILL — glyBURIDE 5 MG TABS: 5 | 30 days supply | Qty: 30 | Fill #2

## 2017-03-21 MED FILL — LOSARTAN-HCTZ 100-25 MG TAB: 100-25 | 30 days supply | Qty: 30 | Fill #1

## 2017-04-04 ENCOUNTER — Ambulatory Visit: Payer: Medicare Other | Attending: Internal Medicine | Admitting: Internal Medicine

## 2017-04-04 ENCOUNTER — Encounter: Payer: Self-pay | Admitting: Internal Medicine

## 2017-04-04 VITALS — BP 134/93 | HR 78 | Temp 98.0°F | Resp 16 | Wt 258.8 lb

## 2017-04-04 DIAGNOSIS — R42 Dizziness and giddiness: Secondary | ICD-10-CM | POA: Diagnosis not present

## 2017-04-04 DIAGNOSIS — G894 Chronic pain syndrome: Secondary | ICD-10-CM | POA: Insufficient documentation

## 2017-04-04 DIAGNOSIS — I10 Essential (primary) hypertension: Secondary | ICD-10-CM | POA: Diagnosis not present

## 2017-04-04 DIAGNOSIS — Z809 Family history of malignant neoplasm, unspecified: Secondary | ICD-10-CM | POA: Insufficient documentation

## 2017-04-04 DIAGNOSIS — Z7982 Long term (current) use of aspirin: Secondary | ICD-10-CM | POA: Insufficient documentation

## 2017-04-04 DIAGNOSIS — G35 Multiple sclerosis: Secondary | ICD-10-CM

## 2017-04-04 DIAGNOSIS — K029 Dental caries, unspecified: Secondary | ICD-10-CM | POA: Diagnosis not present

## 2017-04-04 DIAGNOSIS — M797 Fibromyalgia: Secondary | ICD-10-CM | POA: Diagnosis not present

## 2017-04-04 DIAGNOSIS — E1122 Type 2 diabetes mellitus with diabetic chronic kidney disease: Secondary | ICD-10-CM | POA: Diagnosis not present

## 2017-04-04 DIAGNOSIS — E669 Obesity, unspecified: Secondary | ICD-10-CM | POA: Insufficient documentation

## 2017-04-04 DIAGNOSIS — E559 Vitamin D deficiency, unspecified: Secondary | ICD-10-CM | POA: Diagnosis not present

## 2017-04-04 DIAGNOSIS — Z9889 Other specified postprocedural states: Secondary | ICD-10-CM | POA: Insufficient documentation

## 2017-04-04 DIAGNOSIS — Z79899 Other long term (current) drug therapy: Secondary | ICD-10-CM | POA: Insufficient documentation

## 2017-04-04 DIAGNOSIS — G43909 Migraine, unspecified, not intractable, without status migrainosus: Secondary | ICD-10-CM | POA: Insufficient documentation

## 2017-04-04 DIAGNOSIS — Z8249 Family history of ischemic heart disease and other diseases of the circulatory system: Secondary | ICD-10-CM | POA: Diagnosis not present

## 2017-04-04 DIAGNOSIS — Z720 Tobacco use: Secondary | ICD-10-CM | POA: Diagnosis not present

## 2017-04-04 DIAGNOSIS — F17210 Nicotine dependence, cigarettes, uncomplicated: Secondary | ICD-10-CM | POA: Diagnosis not present

## 2017-04-04 DIAGNOSIS — Z888 Allergy status to other drugs, medicaments and biological substances status: Secondary | ICD-10-CM | POA: Diagnosis not present

## 2017-04-04 DIAGNOSIS — N183 Chronic kidney disease, stage 3 unspecified: Secondary | ICD-10-CM | POA: Insufficient documentation

## 2017-04-04 DIAGNOSIS — I129 Hypertensive chronic kidney disease with stage 1 through stage 4 chronic kidney disease, or unspecified chronic kidney disease: Secondary | ICD-10-CM | POA: Insufficient documentation

## 2017-04-04 DIAGNOSIS — E119 Type 2 diabetes mellitus without complications: Secondary | ICD-10-CM | POA: Insufficient documentation

## 2017-04-04 DIAGNOSIS — M62830 Muscle spasm of back: Secondary | ICD-10-CM | POA: Diagnosis not present

## 2017-04-04 LAB — POCT GLYCOSYLATED HEMOGLOBIN (HGB A1C): HEMOGLOBIN A1C: 6.2

## 2017-04-04 LAB — GLUCOSE, POCT (MANUAL RESULT ENTRY): POC GLUCOSE: 122 mg/dL — AB (ref 70–99)

## 2017-04-04 MED ORDER — ASPIRIN EC 81 MG PO TBEC: 81.0000 mg | DELAYED_RELEASE_TABLET | Freq: Every day | ORAL | 1 refills | Status: AC

## 2017-04-04 MED ORDER — DICLOFENAC SODIUM 1 % TD GEL: 2.0000 g | Freq: Four times a day (QID) | TRANSDERMAL | 2 refills | Status: DC

## 2017-04-04 MED ORDER — LOSARTAN POTASSIUM-HCTZ 100-25 MG PO TABS: 0.5000 | ORAL_TABLET | Freq: Every day | ORAL | 6 refills | Status: DC

## 2017-04-04 MED ORDER — TRUEPLUS LANCETS 26G MISC: 1.0000 | Freq: Three times a day (TID) | 12 refills | Status: DC | PRN

## 2017-04-04 MED ORDER — GLUCOSE BLOOD VI STRP: ORAL_STRIP | 12 refills | Status: DC

## 2017-04-04 MED ORDER — CYCLOBENZAPRINE HCL 10 MG PO TABS: 10.0000 mg | ORAL_TABLET | Freq: Two times a day (BID) | ORAL | 3 refills | Status: DC | PRN

## 2017-04-04 MED ORDER — BUPROPION HCL ER (SR) 150 MG PO TB12: 150.0000 mg | ORAL_TABLET | Freq: Two times a day (BID) | ORAL | 2 refills | Status: DC

## 2017-04-04 MED ORDER — DULOXETINE HCL 30 MG PO CPEP: 30.0000 mg | ORAL_CAPSULE | Freq: Every day | ORAL | 6 refills | Status: DC

## 2017-04-04 NOTE — Progress Notes (Signed)
Patient ID: Tiffany Brady, female    DOB: 24-Apr-1961  MRN: [id number]  CC: re-establish; Diabetes; and Hypertension   Subjective: Tiffany Brady is a 56 y.o. female who presents for re-est care. Last seen 1 yr ago. Had moved to [place] and just moved back to [place]. Her concerns today include:  Pt with hx of DM tyoe 2, HTN, Tob, obesity, fibromyalgia, CKD stage 3 and MS.  Stopped taking all of her medications except for losartan/HCTZ and Flexeril because she was confused about what she is suppose to be taking and was experiencing significant dizziness. She has a bag with her today fill with med bottles  1.  DM: Off all DM meds for several months. States she was told by PCP in [place] that she does not have diabetes. Has glucometer but no stripes. Eating Habits: eating more veg, not a lot of fried foods and less starch "but I have a weakness for french fries." Exercise: walk every day around her apartment building for about 15 mins  2.  HTN -out of BP for several wks so she went to ER last mth to get meds -limits salt in foods -on Losartan/HCTZ 100/25 taking 1/2 a day. Did not take as yet for the morning  3. Fibromyalgia -has bottles of Cymbalta 60 mg and 30 mg but not taking -She was not sure what the medication was for. -Complains of a flareup of fibromyalgia over the past several weeks -hurting in RT leg and upper arms especially LT elbow  4.  Dx with MS last yr -last saw Neurology here 02/2016 Dr. Posey Pronto and was seeing Neuro in [place]. Last seen there about 6 mths ago -stopped taking Tecfidera 240 mg BID since 01/14/2017 for same reasons listed above. She also reports experiencing various symptoms including problems speaking when she takes the medication  5. Tob: smoking 1/2 pk a day Ready to quit. Quit for 2 wks in past. Tried Nicotine patches in past without good results -RF rxn for bupropion but has not started taking as yet  6. Vit D def: took high dose Vit D in  past.  Had colonsocopy in Feb in [place]. No polyps removed. 2. Mammogram and Pap smear   Patient Active Problem List   Diagnosis Date Noted  . Fibromyalgia 03/28/2016  . Diabetes mellitus ([place]) 03/28/2016  . Multiple sclerosis ([place]) 11/02/2015  . Smoking 04/24/2014  . Back muscle spasm 01/28/2014  . Migraine headache 07/23/2013  . Essential hypertension, benign 06/27/2013  . Chronic pain syndrome 06/27/2013  . Obesity, unspecified 06/27/2013  . Nicotine dependence 06/27/2013     Current Outpatient Prescriptions on File Prior to Visit  Medication Sig Dispense Refill  . Blood Glucose Monitoring Suppl (TRUE METRIX GO GLUCOSE METER) w/Device KIT 1 each by Does not apply route every 8 (eight) hours as needed. 1 kit 0  . Dimethyl Fumarate (TECFIDERA) 240 MG CPDR Take 240 mg by mouth 2 (two) times daily.     No current facility-administered medications on file prior to visit.     Allergies  Allergen Reactions  . Pork-Derived Metallurgist    Social History   Social History  . Marital status: Widowed    Spouse name: N/A  . Number of children: N/A  . Years of education: N/A   Occupational History  . Not on file.   Social History Main Topics  . Smoking status: Current Every Day Smoker    Packs/day: 0.75    Years: 40.00  Types: Cigarettes  . Smokeless tobacco: Never Used  . Alcohol use 0.0 oz/week     Comment: occasional  . Drug use: No  . Sexual activity: Not on file   Other Topics Concern  . Not on file   Social History Narrative   Lives with niece and her 3 children in a 2 story home.     Only goes upstairs once a day.  Has 1 child.  Does not work.  Trying to get disability.  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     Education: 10th grade.    Family History  Problem Relation Age of Onset  . Cancer Mother        Deceased  . Hypertension Mother   . Other Father        Deceased, 49  . HIV/AIDS Brother        Deceased  . Multiple  sclerosis Daughter     Past Surgical History:  Procedure Laterality Date  . FOOT SURGERY Right     ROS: Review of Systems Negative except as stated above  PHYSICAL EXAM: BP (!) 134/93   Pulse 78   Temp 98 F (36.7 C) (Oral)   Resp 16   Wt 258 lb 12.8 oz (117.4 kg)   LMP 04/21/2014   SpO2 96%   BMI 44.42 kg/m   Wt Readings from Last 3 Encounters:  04/04/17 258 lb 12.8 oz (117.4 kg)  02/21/17 259 lb (117.5 kg)  03/28/16 251 lb (113.9 kg)    Physical Exam  General appearance - alert, well appearing, obese middle-age African-American female and in no distress Mental status - alert, oriented to person, place, and time, normal mood, behavior, speech, dress, motor activity, and thought processes Eyes - pupils equal and reactive, extraocular eye movements intact Mouth - mucous membranes moist, pharynx normal without lesions. 2 molars decayed and broken off in gum Neck - supple, no significant adenopathy Chest - clear to auscultation, no wheezes, rales or rhonchi, symmetric air entry Heart - normal rate, regular rhythm, normal S1, S2, no murmurs, rubs, clicks or gallops Musculoskeletal -mild tenderness on palpation of the soft tissue in the upper and lower arms. Mild tenderness on palpation of left medial and lateral epicondyles at the elbow  Extremities - peripheral pulses normal, no pedal edema, no clubbing or cyanosis Diabetic Foot Exam - Simple   Simple Foot Form Visual Inspection No deformities, no ulcerations, no other skin breakdown bilaterally:  Yes Sensation Testing Intact to touch and monofilament testing bilaterally:  Yes Pulse Check Posterior Tibialis and Dorsalis pulse intact bilaterally:  Yes Comments      Depression screen PHQ 2/9 04/04/2017  Decreased Interest 0  Down, Depressed, Hopeless 0  PHQ - 2 Score 0   Results for orders placed or performed in visit on 04/04/17  POCT glucose (manual entry)  Result Value Ref Range   POC Glucose 122 (A) 70 - 99  mg/dl  POCT glycosylated hemoglobin (Hb A1C)  Result Value Ref Range   Hemoglobin A1C 6.2     ASSESSMENT AND PLAN: 1. Controlled type 2 diabetes mellitus without complication, without long-term current use of insulin ([place]) -Patient did have A1c in the range for diabetes last year. -She is apparently controlled now without medication. -Patient counseled on importance of healthy eating habits and regular exercise -Check blood sugars 2-3 times a week. Refill given on strips - POCT glucose (manual entry) - POCT glycosylated hemoglobin (Hb A1C) - TRUEPLUS LANCETS  26G MISC; 1 each by Does not apply route every 8 (eight) hours as needed.  Dispense: 100 each; Refill: 12 - glucose blood (TRUE METRIX BLOOD GLUCOSE TEST) test strip; Use as instructed  Dispense: 100 each; Refill: 12 - Comprehensive metabolic panel - CBC - Lipid panel - Microalbumin / creatinine urine ratio - Ambulatory referral to Ophthalmology - aspirin EC 81 MG tablet; Take 1 tablet (81 mg total) by mouth daily.  Dispense: 100 tablet; Refill: 1  2. Chronic pain syndrome -Restart Cymbalta at 30 mg. - cyclobenzaprine (FLEXERIL) 10 MG tablet; Take 1 tablet (10 mg total) by mouth 2 (two) times daily as needed for muscle spasms.  Dispense: 60 tablet; Refill: 3 - DULoxetine (CYMBALTA) 30 MG capsule; Take 1 capsule (30 mg total) by mouth daily.  Dispense: 30 capsule; Refill: 6 - diclofenac sodium (VOLTAREN) 1 % GEL; Apply 2 g topically 4 (four) times daily.  Dispense: 100 g; Refill: 2  3. Essential hypertension DASH diet discussed - losartan-hydrochlorothiazide (HYZAAR) 100-25 MG tablet; Take 0.5 tablets by mouth daily.  Dispense: 30 tablet; Refill: 6  4. Multiple sclerosis ([facility]) -hold of on taking Tecfidera until seen by neurology - Ambulatory referral to Neurology  5. Tobacco abuse Patient advised to quit smoking. Discussed health risks associated with smoking including lung and other types of cancers, chronic lung diseases  and CV risks.. Pt ready to give trail of quitting.  Discussed methods to help quit including quitting cold [place], use of NRT, Chantix and Bupropion.  - buPROPion (WELLBUTRIN SR) 150 MG 12 hr tablet; Take 1 tablet (150 mg total) by mouth 2 (two) times daily.  Dispense: 60 tablet; Refill: 2  6. CKD (chronic kidney disease) stage 3, GFR 30-59 ml/min   7. Dental cavities - Ambulatory referral to Dentistry  8. Vitamin D deficiency - VITAMIN D 25 Hydroxy (Vit-D Deficiency, Fractures)   I went through her med bag with her pointing out medications that we will continue on medicines that she will no longer be taking  Patient was given the opportunity to ask questions.  Patient verbalized understanding of the plan and was able to repeat key elements of the plan.   Orders Placed This Encounter  Procedures  . Comprehensive metabolic panel  . CBC  . Lipid panel  . VITAMIN D 25 Hydroxy (Vit-D Deficiency, Fractures)  . Microalbumin / creatinine urine ratio  . Ambulatory referral to Dentistry  . Ambulatory referral to Ophthalmology  . Ambulatory referral to Neurology  . POCT glucose (manual entry)  . POCT glycosylated hemoglobin (Hb A1C)     Requested Prescriptions   Signed Prescriptions Disp Refills  . buPROPion (WELLBUTRIN SR) 150 MG 12 hr tablet 60 tablet 2    Sig: Take 1 tablet (150 mg total) by mouth 2 (two) times daily.  . cyclobenzaprine (FLEXERIL) 10 MG tablet 60 tablet 3    Sig: Take 1 tablet (10 mg total) by mouth 2 (two) times daily as needed for muscle spasms.  . DULoxetine (CYMBALTA) 30 MG capsule 30 capsule 6    Sig: Take 1 capsule (30 mg total) by mouth daily.  Marland Kitchen losartan-hydrochlorothiazide (HYZAAR) 100-25 MG tablet 30 tablet 6    Sig: Take 0.5 tablets by mouth daily.  . diclofenac sodium (VOLTAREN) 1 % GEL 100 g 2    Sig: Apply 2 g topically 4 (four) times daily.  . TRUEPLUS LANCETS 26G MISC 100 each 12    Sig: 1 each by Does not apply route every 8 (eight)  hours as  needed.  Marland Kitchen glucose blood (TRUE METRIX BLOOD GLUCOSE TEST) test strip 100 each 12    Sig: Use as instructed  . aspirin EC 81 MG tablet 100 tablet 1    Sig: Take 1 tablet (81 mg total) by mouth daily.    Return in about 4 weeks (around 05/02/2017) for PAP.  Karle Plumber, MD, FACP

## 2017-04-04 NOTE — Patient Instructions (Signed)

## 2017-04-05 LAB — CBC
Hematocrit: 47.6 % — ABNORMAL HIGH (ref 34.0–46.6)
Hemoglobin: 16.1 g/dL — ABNORMAL HIGH (ref 11.1–15.9)
MCH: 30.6 pg (ref 26.6–33.0)
MCHC: 33.8 g/dL (ref 31.5–35.7)
MCV: 90 fL (ref 79–97)
PLATELETS: 245 10*3/uL (ref 150–379)
RBC: 5.27 x10E6/uL (ref 3.77–5.28)
RDW: 14.1 % (ref 12.3–15.4)
WBC: 8.1 10*3/uL (ref 3.4–10.8)

## 2017-04-05 LAB — LIPID PANEL
CHOLESTEROL TOTAL: 154 mg/dL (ref 100–199)
Chol/HDL Ratio: 2.9 ratio (ref 0.0–4.4)
HDL: 53 mg/dL (ref >39)
LDL Calculated: 91 mg/dL (ref 0–99)
TRIGLYCERIDES: 52 mg/dL (ref 0–149)
VLDL Cholesterol Cal: 10 mg/dL (ref 5–40)

## 2017-04-05 LAB — COMPREHENSIVE METABOLIC PANEL
A/G RATIO: 1.1 — AB (ref 1.2–2.2)
ALBUMIN: 4.1 g/dL (ref 3.5–5.5)
ALT: 13 IU/L (ref 0–32)
AST: 18 IU/L (ref 0–40)
Alkaline Phosphatase: 108 IU/L (ref 39–117)
BILIRUBIN TOTAL: 0.4 mg/dL (ref 0.0–1.2)
BUN / CREAT RATIO: 12 (ref 9–23)
BUN: 19 mg/dL (ref 6–24)
CHLORIDE: 100 mmol/L (ref 96–106)
CO2: 24 mmol/L (ref 20–29)
Calcium: 10.2 mg/dL (ref 8.7–10.2)
Creatinine, Ser: 1.53 mg/dL — ABNORMAL HIGH (ref 0.57–1.00)
GFR calc non Af Amer: 37 mL/min/{1.73_m2} — ABNORMAL LOW (ref >59)
GFR, EST AFRICAN AMERICAN: 43 mL/min/{1.73_m2} — AB (ref >59)
GLOBULIN, TOTAL: 3.6 g/dL (ref 1.5–4.5)
Glucose: 104 mg/dL — ABNORMAL HIGH (ref 65–99)
POTASSIUM: 4.3 mmol/L (ref 3.5–5.2)
SODIUM: 142 mmol/L (ref 134–144)
TOTAL PROTEIN: 7.7 g/dL (ref 6.0–8.5)

## 2017-04-05 LAB — MICROALBUMIN / CREATININE URINE RATIO
Creatinine, Urine: 166.8 mg/dL
MICROALB/CREAT RATIO: 9 mg/g{creat} (ref 0.0–30.0)
MICROALBUM., U, RANDOM: 15 ug/mL

## 2017-04-05 LAB — VITAMIN D 25 HYDROXY (VIT D DEFICIENCY, FRACTURES): Vit D, 25-Hydroxy: 17.5 ng/mL — ABNORMAL LOW (ref 30.0–100.0)

## 2017-04-17 ENCOUNTER — Telehealth: Payer: Self-pay

## 2017-04-17 DIAGNOSIS — H2513 Age-related nuclear cataract, bilateral: Secondary | ICD-10-CM | POA: Diagnosis not present

## 2017-04-17 DIAGNOSIS — E119 Type 2 diabetes mellitus without complications: Secondary | ICD-10-CM | POA: Diagnosis not present

## 2017-04-17 NOTE — Telephone Encounter (Signed)
Contacted pt to go over lab results pt didn't answer lvm asking pt to give me a call at her earliest convienence   If pt calls back please give results: kidney function not 100% and a little worse from 1 yr ago. Avoid taking over the counter pain meds except Tylenol. Will address further on f/u. Blood count slightly elevated likely due to history of Smoking, cholesterol levels are good. Vitamin D level still low. Recommend taking Vitamin 1000 IU daily. Can be purchased OTC.

## 2017-04-19 ENCOUNTER — Ambulatory Visit (INDEPENDENT_AMBULATORY_CARE_PROVIDER_SITE_OTHER): Payer: Medicare Other | Admitting: Neurology

## 2017-04-19 ENCOUNTER — Encounter: Payer: Self-pay | Admitting: Neurology

## 2017-04-19 VITALS — BP 128/82 | HR 84 | Ht 64.0 in | Wt 260.2 lb

## 2017-04-19 DIAGNOSIS — G35 Multiple sclerosis: Secondary | ICD-10-CM | POA: Diagnosis not present

## 2017-04-19 DIAGNOSIS — Z720 Tobacco use: Secondary | ICD-10-CM

## 2017-04-19 NOTE — Progress Notes (Signed)
Follow-up Visit   Date: 04/19/17   Tiffany Brady MRN: [id number] DOB: 01-Sep-1961   Interim History: Tiffany Brady is a 56 y.o. right-handed African American female with chronic pain syndrome, fibromyaglia, hypertension, and tobacco use returning to the clinic for follow-up of multiple sclerosis.  The patient was accompanied to the clinic by self.  History of present illness: Starting in July 2016, she began experiencing numbness/tingling over the tips of her fingers on both hands which has steadily involved the whole hand. She also has numbness over the right leg above the knees, but this is intermittent. She endorses pain related weakness and has difficulty with walking and doing daily activities such as bathing and cooking because of the pain.   She also has a lot of whole body pain described as soreness which is tender to palpation. She feels as if someone cut her and bleeding on the inside or as if someone is breaking her collar bone.  She was recently started on Cymbalta helps alleviate the pain where she can function, but it is still always there. Her ANA, RF, and TSH is normal. In October 2016, presented to her PCP's office with generalized weakness, gait difficulty, bowel and bladder incontinenced and concerned about MS due to her daughter diagnosed with this.  She underwent MRI brain which showed white matter changes involving the periventricular region and subcortical white matter, concerning for MS.  Although initial imaging also suggested cervical cord compression, subsequent imaging of her cervical spine only showed multilevel spinal stenosis without cord involvement.  She underwent CSF testing which showed normal IgG index, but there was increased oligoclonal bands both in the CSF and serum, but moreso apparent in the CSF.   She started gabapentin 356m BID but had not noticed any changes in her left arm.  She is not using wrist splints due to expense.   She complains  episodic left sided weakness and numbness occuring several times per day, lasting a few minutes. She reports having urge incontinence for the past 15 year, which is triggered by sneezing, coughing, or laughing. She does not have a regular OBGYN provider. She complains of gait difficulty but denies walks and walks unassisted.  She has chronic pain involving her whole body.  In early 2017, she was treated with 3-days of IVMP, and did not notice any difference in any of her symptoms.  She continues to complain of a lot of myalgias and generalized pain throughout (knees, hips, elbows).  She does not see pain management and PCP manages her fibromyalgia.    UPDATE 02/29/2016:  She received her Tecfidera, but has not started this yet. She reports being confused and overwhelmed with all the new medications that she takes, so even contemplated stopping everything.  Upon further questioning, she states she was diagnosed with diabetes and kidney disease.  She has achy pain, and especially of the great toe bilaterally.  She denies numbness/tingling.    UPDATE 04/19/2017:   She stopped all her medications about a month ago because of being stressed with her medications.  Around 2 months ago, she started having memory changes, speech difficulty described as stuttering and word-finding difficulty.  During the same time, [place] was extremely stressed [place] of having dispute with her niece, whom she was living with.  [place] who ordered MRI brain, but she did not follow-up and is unsure of the results or the providers name.  She has now moved back to [place] and is living in a senior citizen home by herself, which has improved her state of mind and is now interested in restarting her medications.  She continues to have neck pain and whole-body pain due to fibromyalgia.     Medications:  Current Outpatient Prescriptions on File Prior to Visit    Medication Sig Dispense Refill  . aspirin EC 81 MG tablet Take 1 tablet (81 mg total) by mouth daily. 100 tablet 1  . Blood Glucose Monitoring Suppl (TRUE METRIX GO GLUCOSE METER) w/Device KIT 1 each by Does not apply route every 8 (eight) hours as needed. 1 kit 0  . buPROPion (WELLBUTRIN SR) 150 MG 12 hr tablet Take 1 tablet (150 mg total) by mouth 2 (two) times daily. 60 tablet 2  . cyclobenzaprine (FLEXERIL) 10 MG tablet Take 1 tablet (10 mg total) by mouth 2 (two) times daily as needed for muscle spasms. 60 tablet 3  . diclofenac sodium (VOLTAREN) 1 % GEL Apply 2 g topically 4 (four) times daily. 100 g 2  . Dimethyl Fumarate (TECFIDERA) 240 MG CPDR Take 240 mg by mouth 2 (two) times daily.    . DULoxetine (CYMBALTA) 30 MG capsule Take 1 capsule (30 mg total) by mouth daily. 30 capsule 6  . glucose blood (TRUE METRIX BLOOD GLUCOSE TEST) test strip Use as instructed 100 each 12  . losartan-hydrochlorothiazide (HYZAAR) 100-25 MG tablet Take 0.5 tablets by mouth daily. 30 tablet 6  . TRUEPLUS LANCETS 26G MISC 1 each by Does not apply route every 8 (eight) hours as needed. 100 each 12   No current facility-administered medications on file prior to visit.     Allergies:  Allergies  Allergen Reactions  . Pork-Derived Products Hives    Review of Systems:  CONSTITUTIONAL: No fevers, chills, night sweats, or weight loss.  EYES: No visual changes or eye pain ENT: No hearing changes.  No history of nose bleeds.   RESPIRATORY: No cough, wheezing and shortness of breath.   CARDIOVASCULAR: Negative for chest pain, and palpitations.   GI: Negative for abdominal discomfort, blood in stools or black stools.  No recent change in bowel habits.   GU:  +history of incontinence.   MUSCLOSKELETAL: +history of joint pain or swelling.  +myalgias.   SKIN: Negative for lesions, rash, and itching.   ENDOCRINE: Negative for cold or heat intolerance, polydipsia or goiter.   PSYCH:  + depression or anxiety  symptoms.   NEURO: As Above.   Vital Signs:  BP 128/82   Pulse 84   Ht _0  (1.626 m)   Wt 260 lb 3.2 oz (118 kg)   LMP 04/21/2014   SpO2 99%   BMI 44.66 kg/m   Neurological Exam: MENTAL STATUS including orientation to time, place, person, recent and remote memory, attention span and concentration, language, and fund of knowledge is normal.    CRANIAL NERVES: No visual field defects. Pupils equal round and reactive to light.  Normal conjugate, extra-ocular eye movements in all directions of gaze.   Face is symmetric. Palate elevates symmetrically.  Tongue is midline.  MOTOR:  Motor strength is 5/5  in all extremities, after repeated effort and encouragement.  Tone is normal.    MSRs:  Reflexes are 2+/4 throughout  SENSORY:  Intact to vibration and temperature throughout.  COORDINATION/GAIT:  Gait is wide-based, unassisted (improved).  Data: MRI cervical spine wwo contrast 07/31/2015: Multilevel degenerative change in the cervical spine with mild  spinal stenosis at C3-4, C4-5, C5-6. Left foraminal narrowing at C5-6 due to disc and osteophyte.  Spinal cord appears normal without cord lesion or abnormal enhancement. No cord compression.  MRI brain wwo contrast 07/24/2015: Extensive and premature for age white matter signal abnormality strongly suspicious for multiple sclerosis. Chronic microvascular ischemic changes are not excluded, but are less favored.  No restricted diffusion or abnormal postcontrast enhancement at any location, but specifically in association with the white matter, to suggest an acute process.  Cervical spondylosis with suspected central disc protrusion/extrusion at C4-5 resulting in cord compression. This could contribute to numbness and pain in the extremities. Correlate clinically for radiculopathy or myelopathy. MRI cervical spine may be helpful in further evaluation as clinically indicated.  EMG of the upper extremities 05/26/2015: 1. Bilateral median  neuropathy at or distal to the wrist, consistent with clinical diagnosis of carpal tunnel syndrome. Overall, these findings are mild in degree electrically and worse on the right. 2. There is no evidence of a cervical radiculopathy or diffuse myopathy affecting upper extremities.  Lab Results  Component Value Date   TSH 2.018 01/27/2014   Labs 09/29/2014: RF 12, ANA neg, ESR 23  CSF testing 08/14/2015:  R34 W0 P37 G67, IgG index 2.8, MPB < 2.0, OCB  >5 well defined gamma restriction bands that are also present in the  patient's corresponding serum sample, but some bands in the CSF are more prominent   IMPRESSION/PLAN: 1.  Probable relapsing remitting multiple sclerosis based on white matter changes on MRI brain and increased oligoclonal bands in the CSF.  IgG index was normal.  She did NOT have any improvement with 3-days of solumedrol suggesting that her pain is not related to MS. Patient was able to get financial clearance for Tecifdera.  She is had MRI brain in March 2018 while living in [place], [place] and I have asked her to find out the name of the office and provider so we can request these records to review.  I have also asked her to request imaging of CD.    - Restart Tecfidera 255m twice daily.  - Check CBC and CMP at next month and every 3 months  2.  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  3.  Bilateral carpal tunnel syndrome, noncompliant with wrist splints.  Encouraged her to use wrist splints nightly.   4.  Vitamin D deficiency, continue vitamin D 4000 units  5.  Chronic history of urge incontinence, fibromyalgia and chronic pain syndrome, follow-up PCP  6.  Counseled on tobacco cessation and encouraged her to quit.  Return to clinic in 5 months   The duration of this appointment visit was 30 minutes of face-to-face time with the patient.  Greater than 50% of this time was spent in counseling, explanation of diagnosis, planning  of further management, and coordination of care.   Thank you for allowing me to participate in patient's care.  If I can answer any additional questions, I would be pleased to do so.    Sincerely,    Laurina Fischl K. PPosey Pronto DO

## 2017-04-19 NOTE — Patient Instructions (Addendum)
Restart Tecfidera 240mg  twice daily  Check CBC and CMP in September  Please send [name] the information of the doctor's office where you had MRI brain performed in [place], [place]   Encouraged her to use wrist splints nightly.   Continue vitamin D 4000 units  Stop smoking  Return to clinic in January

## 2017-04-21 ENCOUNTER — Other Ambulatory Visit: Payer: Self-pay | Admitting: *Deleted

## 2017-04-21 DIAGNOSIS — G35 Multiple sclerosis: Secondary | ICD-10-CM

## 2017-04-21 DIAGNOSIS — Z79899 Other long term (current) drug therapy: Secondary | ICD-10-CM

## 2017-04-21 NOTE — Progress Notes (Signed)
Will inform patient about labs when she calls back.

## 2017-04-25 ENCOUNTER — Telehealth: Payer: Self-pay | Admitting: Neurology

## 2017-04-25 NOTE — Telephone Encounter (Signed)
Patient lmom for you to please call her regarding some information that she has for you. Thanks

## 2017-04-25 NOTE — Telephone Encounter (Signed)
Patient called back with Dr. Acquanetta Sit Lloyd's phone number.  [phone].  Faxed release of information form to [phone].

## 2017-04-25 NOTE — Telephone Encounter (Signed)
Called and left message for patient to call me back.

## 2017-05-08 ENCOUNTER — Ambulatory Visit: Payer: Medicare Other | Attending: Internal Medicine | Admitting: Internal Medicine

## 2017-05-08 ENCOUNTER — Encounter: Payer: Self-pay | Admitting: Internal Medicine

## 2017-05-08 ENCOUNTER — Other Ambulatory Visit (HOSPITAL_COMMUNITY)
Admission: RE | Admit: 2017-05-08 | Discharge: 2017-05-08 | Disposition: A | Discharge status: Home / Self Care | Payer: Medicare Other | Source: Ambulatory Visit | Attending: Internal Medicine | Admitting: Internal Medicine

## 2017-05-08 VITALS — BP 138/83 | HR 82 | Temp 98.4°F | Resp 18 | Ht 64.0 in | Wt 259.0 lb

## 2017-05-08 DIAGNOSIS — H9191 Unspecified hearing loss, right ear: Secondary | ICD-10-CM | POA: Diagnosis not present

## 2017-05-08 DIAGNOSIS — N393 Stress incontinence (female) (male): Secondary | ICD-10-CM | POA: Insufficient documentation

## 2017-05-08 DIAGNOSIS — F17210 Nicotine dependence, cigarettes, uncomplicated: Secondary | ICD-10-CM | POA: Insufficient documentation

## 2017-05-08 DIAGNOSIS — G35 Multiple sclerosis: Secondary | ICD-10-CM | POA: Insufficient documentation

## 2017-05-08 DIAGNOSIS — Z23 Encounter for immunization: Secondary | ICD-10-CM | POA: Diagnosis not present

## 2017-05-08 DIAGNOSIS — I129 Hypertensive chronic kidney disease with stage 1 through stage 4 chronic kidney disease, or unspecified chronic kidney disease: Secondary | ICD-10-CM | POA: Diagnosis not present

## 2017-05-08 DIAGNOSIS — Z7982 Long term (current) use of aspirin: Secondary | ICD-10-CM | POA: Insufficient documentation

## 2017-05-08 DIAGNOSIS — E1122 Type 2 diabetes mellitus with diabetic chronic kidney disease: Secondary | ICD-10-CM | POA: Insufficient documentation

## 2017-05-08 DIAGNOSIS — Z01419 Encounter for gynecological examination (general) (routine) without abnormal findings: Secondary | ICD-10-CM | POA: Insufficient documentation

## 2017-05-08 DIAGNOSIS — G894 Chronic pain syndrome: Secondary | ICD-10-CM | POA: Diagnosis not present

## 2017-05-08 DIAGNOSIS — E669 Obesity, unspecified: Secondary | ICD-10-CM | POA: Diagnosis not present

## 2017-05-08 DIAGNOSIS — Z8249 Family history of ischemic heart disease and other diseases of the circulatory system: Secondary | ICD-10-CM | POA: Diagnosis not present

## 2017-05-08 DIAGNOSIS — M5412 Radiculopathy, cervical region: Secondary | ICD-10-CM | POA: Insufficient documentation

## 2017-05-08 DIAGNOSIS — M797 Fibromyalgia: Secondary | ICD-10-CM | POA: Diagnosis not present

## 2017-05-08 DIAGNOSIS — E559 Vitamin D deficiency, unspecified: Secondary | ICD-10-CM | POA: Insufficient documentation

## 2017-05-08 DIAGNOSIS — N183 Chronic kidney disease, stage 3 unspecified: Secondary | ICD-10-CM

## 2017-05-08 DIAGNOSIS — E119 Type 2 diabetes mellitus without complications: Secondary | ICD-10-CM

## 2017-05-08 DIAGNOSIS — Z79899 Other long term (current) drug therapy: Secondary | ICD-10-CM | POA: Insufficient documentation

## 2017-05-08 DIAGNOSIS — Z6841 Body Mass Index (BMI) 40.0 and over, adult: Secondary | ICD-10-CM | POA: Diagnosis not present

## 2017-05-08 DIAGNOSIS — Z124 Encounter for screening for malignant neoplasm of cervix: Secondary | ICD-10-CM | POA: Diagnosis not present

## 2017-05-08 LAB — GLUCOSE, POCT (MANUAL RESULT ENTRY): POC Glucose: 117 mg/dl — AB (ref 70–99)

## 2017-05-08 MED ORDER — SOLIFENACIN SUCCINATE 5 MG PO TABS: 5.0000 mg | ORAL_TABLET | Freq: Every day | ORAL | 1 refills | Status: DC

## 2017-05-08 MED ORDER — DICLOFENAC SODIUM 1 % TD GEL: 2.0000 g | Freq: Four times a day (QID) | TRANSDERMAL | 2 refills | Status: DC

## 2017-05-08 NOTE — Progress Notes (Signed)
Patient ID: Tiffany Brady, female    DOB: 1961-04-08  MRN: [id number]  CC: Gynecologic Exam   Subjective: Tiffany Brady is a 56 y.o. female who presents for PAP/breast exam Her concerns today include:  Pt with hx of DM tyoe 2, HTN, Tob, obesity, fibromyalgia, CKD stage 3 and MS.    1. MS: since last visit, she saw neurologist Dr. Posey Pronto and restarted on Tecfidera. Doing okay on this  2. On lab test  Kidney function was a little worse compared to last yr. eGFR 43  3. Vitamin D level still low. Recommend taking Vitamin 1000 IU daily OTC. However still had 6 tablets of high-dose vitamin D at home. Taking 1 per week.  4. DM:  Saw eye doctor and given prescription for new lenses.  5. G2P1 (miscarriage) No abnormal paps in past No vaginal dischg or itching at this time Sexually active with one partner C/o incontinence with laughing/coughing. Able to hold urine when she gets urge. Wearing pampers -did Kegels for over 1 yr without improvement  6. Complaining of pain in the neck with radiation to the left arm. History of multilevel DJD with mild spinal stenosis. Saw ortho about 2 years ago and had injections to the neck with good results. Requesting referral again.  7. Decreased hearing in the right ear that is getting progressively worse 2 years. No history of trauma to the ear   Patient Active Problem List   Diagnosis Date Noted  . CKD (chronic kidney disease) stage 3, GFR 30-59 ml/min 04/04/2017  . Vitamin D deficiency 04/04/2017  . Controlled type 2 diabetes mellitus without complication, without long-term current use of insulin ([place]) 04/04/2017  . Fibromyalgia 03/28/2016  . Multiple sclerosis ([place]) 11/02/2015  . Tobacco abuse 04/24/2014  . Back muscle spasm 01/28/2014  . Migraine headache 07/23/2013  . Essential hypertension 06/27/2013  . Chronic pain syndrome 06/27/2013  . Obesity, unspecified 06/27/2013     Current Outpatient Prescriptions on File Prior to  Visit  Medication Sig Dispense Refill  . aspirin EC 81 MG tablet Take 1 tablet (81 mg total) by mouth daily. 100 tablet 1  . Blood Glucose Monitoring Suppl (TRUE METRIX GO GLUCOSE METER) w/Device KIT 1 each by Does not apply route every 8 (eight) hours as needed. 1 kit 0  . buPROPion (WELLBUTRIN SR) 150 MG 12 hr tablet Take 1 tablet (150 mg total) by mouth 2 (two) times daily. 60 tablet 2  . cyclobenzaprine (FLEXERIL) 10 MG tablet Take 1 tablet (10 mg total) by mouth 2 (two) times daily as needed for muscle spasms. 60 tablet 3  . diclofenac sodium (VOLTAREN) 1 % GEL Apply 2 g topically 4 (four) times daily. 100 g 2  . Dimethyl Fumarate (TECFIDERA) 240 MG CPDR Take 240 mg by mouth 2 (two) times daily.    . DULoxetine (CYMBALTA) 30 MG capsule Take 1 capsule (30 mg total) by mouth daily. 30 capsule 6  . glucose blood (TRUE METRIX BLOOD GLUCOSE TEST) test strip Use as instructed 100 each 12  . losartan-hydrochlorothiazide (HYZAAR) 100-25 MG tablet Take 0.5 tablets by mouth daily. 30 tablet 6  . TRUEPLUS LANCETS 26G MISC 1 each by Does not apply route every 8 (eight) hours as needed. 100 each 12   No current facility-administered medications on file prior to visit.     Allergies  Allergen Reactions  . Pork-Derived Metallurgist    Social History   Social History  . Marital status: Widowed  Spouse name: N/A  . Number of children: N/A  . Years of education: N/A   Occupational History  . Not on file.   Social History Main Topics  . Smoking status: Current Every Day Smoker    Packs/day: 0.75    Years: 40.00    Types: Cigarettes  . Smokeless tobacco: Never Used  . Alcohol use 0.0 oz/week     Comment: occasional  . Drug use: No  . Sexual activity: Not on file   Other Topics Concern  . Not on file   Social History Narrative   Lives with niece and her 3 children in a 2 story home.     Only goes upstairs once a day.  Has 1 child.  Does not work.  Trying to get disability.  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     Education: 10th grade.    Family History  Problem Relation Age of Onset  . Cancer Mother        Deceased  . Hypertension Mother   . Other Father        Deceased, 62  . HIV/AIDS Brother        Deceased  . Multiple sclerosis Daughter     Past Surgical History:  Procedure Laterality Date  . FOOT SURGERY Right     ROS: Review of Systems negative except as stated above   PHYSICAL EXAM: BP 138/83 (BP Location: Left Arm, Patient Position: Sitting, Cuff Size: Large)   Pulse 82   Temp 98.4 F (36.9 C) (Oral)   Resp 18   Ht _0  (1.626 m)   Wt 259 lb (117.5 kg)   LMP 04/21/2014   SpO2 96%   BMI 44.46 kg/m   Physical Exam  General appearance - alert, well appearing, and in no distress Mental status - alert, oriented to person, place, and time, normal mood, behavior, speech, dress, motor activity, and thought processes Ears - bilateral TM's and external ear canals normal Breasts - breasts appear normal, no suspicious masses, no skin or nipple changes or axillary nodes Pelvic - CMA: [place] present: normal external genitalia, vulva, vagina, cervix, uterus and adnexa Musculoskeletal - neck: Mild tenderness of the mid to lower cervical spine. Discomfort with rotation of the neck. Good range of motion at both shoulders.  Results for orders placed or performed in visit on 05/08/17  Glucose (CBG)  Result Value Ref Range   POC Glucose 117 (A) 70 - 99 mg/dl    Lab Results  Component Value Date   CHOL 154 04/04/2017   HDL 53 04/04/2017   LDLCALC 91 04/04/2017   TRIG 52 04/04/2017   CHOLHDL 2.9 04/04/2017   Lab Results  Component Value Date   WBC 8.1 04/04/2017   HGB 16.1 (H) 04/04/2017   HCT 47.6 (H) 04/04/2017   MCV 90 04/04/2017   PLT 245 04/04/2017     Chemistry      Component Value Date/Time   NA 142 04/04/2017 1052   K 4.3 04/04/2017 1052   CL 100 04/04/2017 1052   CO2 24 04/04/2017 1052   BUN 19 04/04/2017  1052   CREATININE 1.53 (H) 04/04/2017 1052   CREATININE 1.40 (H) 03/28/2016 1601      Component Value Date/Time   CALCIUM 10.2 04/04/2017 1052   ALKPHOS 108 04/04/2017 1052   AST 18 04/04/2017 1052   ALT 13 04/04/2017 1052   BILITOT 0.4 04/04/2017 1052      ASSESSMENT AND  PLAN: 1. Pap smear for cervical cancer screening - Cytology - PAP  2. Controlled type 2 diabetes mellitus without complication, without long-term current use of insulin (HCC) Diet control. - Glucose (CBG)  3. Stress incontinence Discussed referral to urogynecology versus trial of Vesicare. Patient informed that this medication is used to treat more urge incontinence than stress. However she is willing to give it a try and if no improvement will call back to request referral to the uro-gynecologist - solifenacin (VESICARE) 5 MG tablet; Take 1 tablet (5 mg total) by mouth daily.  Dispense: 30 tablet; Refill: 1  4. CKD (chronic kidney disease) stage 3, GFR 30-59 ml/min Advised against use of NSAIDs  5. Decreased hearing, right ENT referral  6. Cervical radiculopathy -Orthopedic referral  7. Vitamin D deficiency Complete weekly high-dose vitamin D. Then vitamin D 1000 IU OTC  8. Need for influenza vaccination - Flu Vaccine QUAD 6+ mos PF IM (Fluarix Quad PF)  9. Chronic pain syndrome - diclofenac sodium (VOLTAREN) 1 % GEL; Apply 2 g topically 4 (four) times daily.  Dispense: 100 g; Refill: 2   Patient was given the opportunity to ask questions.  Patient verbalized understanding of the plan and was able to repeat key elements of the plan.   Orders Placed This Encounter  Procedures  . Glucose (CBG)     Requested Prescriptions    No prescriptions requested or ordered in this encounter    No Follow-up on file.  Karle Plumber, MD, FACP

## 2017-05-08 NOTE — Patient Instructions (Signed)
Urinary Incontinence Urinary incontinence is the involuntary loss of urine from your bladder. What are the causes? There are many causes of urinary incontinence. They include:  Medicines.  Infections.  Prostatic enlargement, leading to overflow of urine from your bladder.  Surgery.  Neurological diseases.  Emotional factors.  What are the signs or symptoms? Urinary Incontinence can be divided into four types: 1. Urge incontinence. Urge incontinence is the involuntary loss of urine before you have the opportunity to go to the bathroom. There is a sudden urge to void but not enough time to reach a bathroom. 2. Stress incontinence. Stress incontinence is the sudden loss of urine with any activity that forces urine to pass. It is commonly caused by anatomical changes to the pelvis and sphincter areas of your body. 3. Overflow incontinence. Overflow incontinence is the loss of urine from an obstructed opening to your bladder. This results in a backup of urine and a resultant buildup of pressure within the bladder. When the pressure within the bladder exceeds the closing pressure of the sphincter, the urine overflows, which causes incontinence, similar to water overflowing a dam. 4. Total incontinence. Total incontinence is the loss of urine as a result of the inability to store urine within your bladder.  How is this diagnosed? Evaluating the cause of incontinence may require:  A thorough and complete medical and obstetric history.  A complete physical exam.  Laboratory tests such as a urine culture and sensitivities.  When additional tests are indicated, they can include:  An ultrasound exam.  Kidney and bladder X-rays.  Cystoscopy. This is an exam of the bladder using a narrow scope.  Urodynamic testing to test the nerve function to the bladder and sphincter areas.  How is this treated? Treatment for urinary incontinence depends on the cause:  For urge incontinence caused  by a bacterial infection, antibiotics will be prescribed. If the urge incontinence is related to medicines you take, your health care provider may have you change the medicine.  For stress incontinence, surgery to re-establish anatomical support to the bladder or sphincter, or both, will often correct the condition.  For overflow incontinence caused by an enlarged prostate, an operation to open the channel through the enlarged prostate will allow the flow of urine out of the bladder. In women with fibroids, a hysterectomy may be recommended.  For total incontinence, surgery on your urinary sphincter may help. An artificial urinary sphincter (an inflatable cuff placed around the urethra) may be required. In women who have developed a hole-like passage between their bladder and vagina (vesicovaginal fistula), surgery to close the fistula often is required.  Follow these instructions at home:  Normal daily hygiene and the use of pads or adult diapers that are changed regularly will help prevent odors and skin damage.  Avoid caffeine. It can overstimulate your bladder.  Use the bathroom regularly. Try about every 2-3 hours to go to the bathroom, even if you do not feel the need to do so. Take time to empty your bladder completely. After urinating, wait a minute. Then try to urinate again.  For causes involving nerve dysfunction, keep a log of the medicines you take and a journal of the times you go to the bathroom. Contact a health care provider if:  You experience worsening of pain instead of improvement in pain after your procedure.  Your incontinence becomes worse instead of better. Get help right away if:  You experience fever or shaking chills.  You are unable to   pass your urine.  You have redness spreading into your groin or down into your thighs. This information is not intended to replace advice given to you by your health care provider. Make sure you discuss any questions you have  with your health care provider. Document Released: 10/06/2004 Document Revised: 04/08/2016 Document Reviewed: 02/05/2013 Elsevier Interactive Patient Education  2018 Elsevier Inc.  

## 2017-05-10 ENCOUNTER — Other Ambulatory Visit: Payer: Self-pay

## 2017-05-10 ENCOUNTER — Other Ambulatory Visit: Payer: Self-pay | Admitting: Internal Medicine

## 2017-05-10 LAB — CYTOLOGY - PAP
Bacterial vaginitis: NEGATIVE
CANDIDA VAGINITIS: POSITIVE — AB
Chlamydia: NEGATIVE
DIAGNOSIS: NEGATIVE
HPV: NOT DETECTED
Neisseria Gonorrhea: NEGATIVE
Trichomonas: NEGATIVE

## 2017-05-10 MED ORDER — FLUCONAZOLE 150 MG PO TABS: 150.0000 mg | ORAL_TABLET | Freq: Once | ORAL | 0 refills | Status: AC

## 2017-05-16 ENCOUNTER — Telehealth: Payer: Self-pay

## 2017-05-16 NOTE — Telephone Encounter (Signed)
Contacted pt to go over pap results pt didn't answer and was unable to lvm.  If pt calls back please give results: pap was negative for any cancer cells. + for yeast. Rxn sent to our pharmacy for Diflucan to treat. Tests for Chlamydia, gonorrhea and Trichomonas were negative.

## 2017-05-17 ENCOUNTER — Telehealth: Payer: Self-pay | Admitting: Internal Medicine

## 2017-05-17 NOTE — Telephone Encounter (Signed)
Pt called to request someone to call her for lab result, please follow up

## 2017-05-25 ENCOUNTER — Telehealth: Payer: Self-pay | Admitting: Neurology

## 2017-05-25 NOTE — Telephone Encounter (Signed)
Records received from [facility] in [place]  MRI brain with and without contrast 10/26/2016: Extensive periventricular hyperintensity which demonstrate distribution and morphology consistent with multiple sclerosis. There is no evidence of enhancement to suggest acute demyelination.   Please inform patient that I have received and reviewed her records from [place]. We will repeat MRI in the spring of 2019.  Donika K. Posey Pronto, DO

## 2017-05-29 ENCOUNTER — Other Ambulatory Visit: Payer: Self-pay | Admitting: *Deleted

## 2017-05-29 DIAGNOSIS — G35 Multiple sclerosis: Secondary | ICD-10-CM

## 2017-05-29 MED ORDER — AMBULATORY NON FORMULARY MEDICATION: 1.0000 [IU] | Freq: Every day | 0 refills | Status: DC

## 2017-05-29 MED FILL — LOSARTAN-HCTZ 100-25 MG TAB: 100-25 | 60 days supply | Qty: 30 | Fill #0 | Status: TO

## 2017-05-29 MED FILL — FLUCONAZOLE 150 MG TABLET: 150 | 1 days supply | Qty: 1 | Fill #0

## 2017-05-29 MED FILL — BUPROPION SR 150 MG TABLET: 150 | 30 days supply | Qty: 60 | Fill #0 | Status: TO

## 2017-05-29 MED FILL — ?CYCLOBENZAPRINE 10 MG TABL: 10 | 30 days supply | Qty: 60 | Fill #0 | Status: TO

## 2017-05-29 MED FILL — ?DULOXETINE HCL DR 30 MG CA: 30 MG | 30 days supply | Qty: 30 | Fill #0 | Status: TO

## 2017-05-29 NOTE — Telephone Encounter (Signed)
Patient given information per Dr. Posey Pronto.  Patient requested Rx for quad cane.  Rx mailed to patient.

## 2017-06-01 ENCOUNTER — Ambulatory Visit (INDEPENDENT_AMBULATORY_CARE_PROVIDER_SITE_OTHER): Payer: Medicare Other

## 2017-06-01 ENCOUNTER — Other Ambulatory Visit (INDEPENDENT_AMBULATORY_CARE_PROVIDER_SITE_OTHER): Payer: Self-pay

## 2017-06-01 ENCOUNTER — Ambulatory Visit (INDEPENDENT_AMBULATORY_CARE_PROVIDER_SITE_OTHER): Payer: Medicare Other | Admitting: Orthopaedic Surgery

## 2017-06-01 DIAGNOSIS — G894 Chronic pain syndrome: Secondary | ICD-10-CM

## 2017-06-01 DIAGNOSIS — M5412 Radiculopathy, cervical region: Secondary | ICD-10-CM

## 2017-06-01 DIAGNOSIS — M542 Cervicalgia: Secondary | ICD-10-CM

## 2017-06-01 DIAGNOSIS — G35 Multiple sclerosis: Secondary | ICD-10-CM

## 2017-06-01 DIAGNOSIS — M797 Fibromyalgia: Secondary | ICD-10-CM | POA: Diagnosis not present

## 2017-06-01 NOTE — Progress Notes (Signed)
Office Visit Note   Patient: Tiffany Brady           Date of Birth: 1961-06-17           MRN: [id number] Visit Date: 06/01/2017              Requested by: Ladell Pier, MD [address] [place], [place] [address] PCP: Ladell Pier, MD   Assessment & Plan: Visit Diagnoses:  1. Neck pain   2. Cervical radiculopathy   3. Chronic pain syndrome   4. Fibromyalgia   5. Multiple sclerosis ([place])     Plan: I do feel that she would benefit from a combined approach of physical therapy for her neck shoulders back as well as balance and coordination. She said the injection that Dr. Ernestina Patches provided in her cervical spine 2016 work greatly and she like to have this again. I do feel that a left C7-T1 interlaminar injection would be appropriate given the success of her last injection. I'll see her back myself in 4 weeks and hopefully by then she'll that physical therapy as well as her injection.  Follow-Up Instructions: Return in about 4 weeks (around 06/29/2017).   Orders:  Orders Placed This Encounter  Procedures  . XR Cervical Spine 2 or 3 views   No orders of the defined types were placed in this encounter.     Procedures: No procedures performed   Clinical Data: No additional findings.   Subjective: No chief complaint on file. The patient is someone who is been seen her almost before by my partner Dr.Xu as well as Dr. Ernestina Patches. She is someone who has chronic pain syndrome due to her [place] and multiple sclerosis. In 2016 Dr. Ernestina Patches provided an intralaminar injection in her cervical spine at C7-T1 on the left side due to radicular symptoms and a disc osteophyte complex at the C5-C6 level that was causing radicular symptoms on her left side. She still has chronic pain of her neck and shoulders it does radiate down her left side still. She felt like that injection mother and it was very successful and would like to try that again. She has not been through any physical therapy  here in [place] and [place] here from [place] where she did have physical therapy before.  HPI  Review of Systems She currently denies any headache, chest pain, shortness of breath, fever, chills, nausea, vomiting.  Objective: Vital Signs: LMP 04/21/2014   Physical Exam She is alert and oriented 3 and in no acute distress Ortho Exam Examination of her cervical spine shows full motion but pain in all planes of motion. She has multiple trigger points in her upper extremities causing pain is well and is certainly can clotting examination is well due to her pain. Specialty Comments:  No specialty comments available.  Imaging: Xr Cervical Spine 2 Or 3 Views  Result Date: 06/01/2017 2 views of the cervical spine show chronic degenerative changes with loss of her cervical lordosis. She has more of a kyphosis that I do not believe this is changed over the last years.    PMFS History: Patient Active Problem List   Diagnosis Date Noted  . Stress incontinence 05/08/2017  . Cervical radiculopathy 05/08/2017  . CKD (chronic kidney disease) stage 3, GFR 30-59 ml/min 04/04/2017  . Vitamin D deficiency 04/04/2017  . Controlled type 2 diabetes mellitus without complication, without long-term current use of insulin ([place]) 04/04/2017  . Fibromyalgia 03/28/2016  . Multiple sclerosis ([place]) 11/02/2015  .  Tobacco abuse 04/24/2014  . Back muscle spasm 01/28/2014  . Migraine headache 07/23/2013  . Essential hypertension 06/27/2013  . Chronic pain syndrome 06/27/2013  . Obesity, unspecified 06/27/2013   Past Medical History:  Diagnosis Date  . Chronic pain syndrome   . Gout   . Hypertension   . MS (multiple sclerosis) ([place])   . Tobacco use     Family History  Problem Relation Age of Onset  . Cancer Mother        Deceased  . Hypertension Mother   . Other Father        Deceased, 48  . HIV/AIDS Brother        Deceased  . Multiple sclerosis Daughter     Past Surgical History:    Procedure Laterality Date  . FOOT SURGERY Right    Social History   Occupational History  . Not on file.   Social History Main Topics  . Smoking status: Current Every Day Smoker    Packs/day: 0.75    Years: 40.00    Types: Cigarettes  . Smokeless tobacco: Never Used  . Alcohol use 0.0 oz/week     Comment: occasional  . Drug use: No  . Sexual activity: Not on file

## 2017-06-02 ENCOUNTER — Ambulatory Visit: Payer: Medicare Other | Admitting: Neurology

## 2017-06-06 ENCOUNTER — Other Ambulatory Visit: Payer: Self-pay | Admitting: Internal Medicine

## 2017-06-06 DIAGNOSIS — Z1231 Encounter for screening mammogram for malignant neoplasm of breast: Secondary | ICD-10-CM

## 2017-06-09 ENCOUNTER — Ambulatory Visit: Payer: Medicare Other | Attending: Internal Medicine | Admitting: Physical Therapy

## 2017-06-09 ENCOUNTER — Encounter: Payer: Self-pay | Admitting: Physical Therapy

## 2017-06-09 DIAGNOSIS — M62838 Other muscle spasm: Secondary | ICD-10-CM

## 2017-06-09 DIAGNOSIS — R293 Abnormal posture: Secondary | ICD-10-CM

## 2017-06-09 DIAGNOSIS — R2689 Other abnormalities of gait and mobility: Secondary | ICD-10-CM | POA: Diagnosis not present

## 2017-06-09 DIAGNOSIS — M542 Cervicalgia: Secondary | ICD-10-CM | POA: Diagnosis not present

## 2017-06-09 NOTE — Therapy (Signed)
[place], [place], [address] Phone: [phone]   Fax:  [phone]  Physical Therapy Evaluation  Patient Details  Name: Tiffany Brady MRN: [id number] Date of Birth: 06-19-1961 Referring Provider: Zollie Beckers MD  Encounter Date: 06/09/2017      PT End of Session - 06/09/17 0929    Visit Number 1   Number of Visits 17   Date for PT Re-Evaluation 08/04/17   Authorization Type MCR: KX mod by 15th visit, progress note byt 10th visit   PT Start Time 0845   PT Stop Time 0926   PT Time Calculation (min) 41 min   Activity Tolerance Patient tolerated treatment well;Patient limited by pain   Behavior During Therapy [facility] for tasks assessed/performed      Past Medical History:  Diagnosis Date  . Chronic kidney disease    pt reported kidney failure  . Chronic pain syndrome   . Gout   . Hypertension   . MS (multiple sclerosis) ([place])   . Tobacco use     Past Surgical History:  Procedure Laterality Date  . FOOT SURGERY Right     There were no vitals filed for this visit.       Subjective Assessment - 06/09/17 0854    Subjective pt is a 56 y.o F with CC of neck pain that has been going on for a few years with no specific MOI, which pt reported it may be due to work. pt reported pain radiates down both arms  down to the hands and all fingers with N/T. pt reports no changes in pain since but can flucutate depending on activity. pt reports having intemrittent HA, tinnitis and occasional double vision.    Pertinent History hx or MS and chronic pain.    Limitations Lifting;Standing;Walking;Sitting   How long can you sit comfortably? 15- 20 min   How long can you stand comfortably? 10-15 min with [facility]   How long can you walk comfortably? 10-15 min with [facility]   Diagnostic tests 06/01/2017 x-ray   Patient Stated Goals stop hurting, playing billards, to pick up grandchildren, to sleep better, be able to perofrm  personal hygiene better without pain   Currently in Pain? Yes   Pain Score 10-Worst pain ever  didn't take any medication   Pain Location Neck   Pain Orientation Right;Left;Mid   Pain Descriptors / Indicators Pins and needles;Numbness;Sharp;Tightness;Burning   Pain Type Chronic pain   Pain Radiating Towards pain and N/T into bil arms wrist and hands.    Pain Onset More than a month ago   Pain Frequency Constant   Aggravating Factors  prolonged standing / walking, any neck movements, reaching, cleaning self   Pain Relieving Factors laying down, trying to walk,    Effect of Pain on Daily Activities limited endurance with any activity, difficulty with personal hygiene,             [facility] PT Assessment - 06/09/17 0902      Assessment   Medical Diagnosis cervical radiculopthy, chronic pain syndrom and MS   Referring Provider Zollie Beckers MD   Onset Date/Surgical Date --  for a few years   Hand Dominance Right   Next MD Visit --  3-4 weeks   Prior Therapy no     Precautions   Precaution Comments hx of MS      Restrictions   Weight Bearing Restrictions No     Balance Screen   Has the  patient fallen in the past 6 months No  but a couple near falls   Has the patient had a decrease in activity level because of a fear of falling?  No   Is the patient reluctant to leave their home because of a fear of falling?  No     Home Environment   Living Environment Private residence   Living Arrangements Alone   Type of Home Apartment   Home Access Level entry   Home Layout One level   Botines - single point     Prior Function   Level of [place] with basic ADLs   Vocation On disability   Leisure billards, smoking cigarettes, watching  Football     Cognition   Overall Cognitive Status Within Functional Limits for tasks assessed     Observation/Other Assessments   Focus on Therapeutic Outcomes (FOTO)  82% limited  predicted 59% limited      Posture/Postural Control   Posture/Postural Control Postural limitations   Postural Limitations Forward head;Rounded Shoulders     ROM / Strength   AROM / PROM / Strength AROM;Strength     AROM   AROM Assessment Site Cervical   Cervical Flexion 22  PDM   Cervical Extension 22  PDM   Cervical - Right Side Bend 10   Cervical - Left Side Bend 6  ERP   Cervical - Right Rotation 37  PDM   Cervical - Left Rotation 27  PDM     Strength   Strength Assessment Site Shoulder   Right/Left Shoulder Right;Left   Right Shoulder Flexion 3/5  pain during testing   Right Shoulder Extension 4-/5  pain during testing   Right Shoulder ABduction 3+/5  in available ROM, with pain during testing   Right Shoulder Internal Rotation 3+/5  pain during testing    Right Shoulder External Rotation 3+/5  pain during testing    Left Shoulder Flexion 3/5   Left Shoulder Extension 3+/5  pain during testing   Left Shoulder ABduction 3+/5  in available ROM, with pain during testing   Left Shoulder Internal Rotation 3/5  pain during testing    Left Shoulder External Rotation 3/5  pain during testing      Palpation   Palpation comment TTP with tightness in bil upper traps. levator scapulae, sub-occipitals     Special Tests    Special Tests Cervical   Cervical Tests Spurling's;Dictraction;other     Spurling's   Findings Positive   Side --  bil     Distraction Test   Findngs Positive   Comment decreased referral pain to the UE     other    Findings Positive   Side --  bil with L>R   Comment ULTT median nerver     Ambulation/Gait   Ambulation/Gait Yes   Assistive device Straight cane   Gait Pattern Step-through pattern;Decreased stride length;Trendelenburg;Antalgic;Trunk flexed            Objective measurements completed on examination: See above findings.          [facility] Adult PT Treatment/Exercise - 06/09/17 0953      Neck Exercises: Seated   Neck Retraction --  1 x 10    Other Seated Exercise seated thoracic 1 x 10    Other Seated Exercise upper cervical rotation 2 x 10  using hand on chest for tactile cues     Neck Exercises: Stretches   Upper Trapezius Stretch 2 reps;30 seconds  PT Education - 06/09/17 0924    Education provided Yes   Education Details evaluation findings, POC, goals, HEP with proper form, anatomy of the area involved and effect of referral sx.   Person(s) Educated Patient   Methods Explanation;Verbal cues;Handout   Comprehension Verbalized understanding;Verbal cues required          PT Short Term Goals - 06/09/17 0942      PT SHORT TERM GOAL #1   Title pt to be I with inital HEp    Time 4   Period Weeks   Status New   Target Date 07/07/17     PT SHORT TERM GOAL #2   Title pt to demonstrate proper posture with sitting/ standing and lifting mechanics to prevent and reduce neck pain   Time 4   Period Weeks   Status New   Target Date 07/07/17     PT SHORT TERM GOAL #3   Title pt to decrease cervical muslce spasm to reduce pain to </= 7/10 and promote cervical mobility    Time 4   Period Weeks   Status New   Target Date 07/07/17           PT Long Term Goals - 06/09/17 0946      PT LONG TERM GOAL #1   Title pt to increase cervical mobility by >/= 15 degrees for flexion, 10 degrees extension / sidebending and rotation to assist with ADLs and safety with driving    Time 8   Period Weeks   Target Date 08/04/17     PT LONG TERM GOAL #2   Title pt to increase bil UE strength to >/= 4/5 to assist with functional strength required for lifting and carrying activities.   Time 8   Period Weeks   Status New   Target Date 08/04/17     PT LONG TERM GOAL #3   Title pt to report decreased N/T and pain down bil UE to demonstrate improvement in condition for QOL   Time 8   Period Weeks   Status New   Target Date 08/04/17     PT LONG TERM GOAL #4   Title pt to increase FOTO score to </= 59%  limited to demonstrate improvement in function   Time 8   Period Weeks   Status New   Target Date 08/04/17     PT LONG TERM GOAL #5   Title pt to be I with all HEP given as of last visit    Time 8   Period Weeks   Status New   Target Date 08/04/17                Plan - 06/09/17 8119    Clinical Impression Statement pt presents to OPPT with CC of neck pain with referral down bil UE that started a few years ago. significant limtation with cervical mobility in all planes. TTP in bil upper traps, levator scapulae and sub-occipitals. pt demonstrated 4/4 positive finding cluster testing for cervical radiculapthy indicaiting high likelihood of pathology. She would benefit from physcial therapty to decrease neck pain, improve mobility, and maximize her function by addressing the deficits listed.    History and Personal Factors relevant to plan of care: hx of MS, hx or chronic pain syndrome, age   Clinical Presentation Evolving   Clinical Presentation due to: limited neck mobility, muscle spasm, referred pain down bil UE, fluctuating symptoms   Clinical Decision Making Moderate   Rehab Potential Good  PT Frequency 2x / week   PT Duration 8 weeks   PT Treatment/Interventions ADLs/Self Care Home Management;Cryotherapy;Electrical Stimulation;Iontophoresis 4mg /ml Dexamethasone;Moist Heat;Ultrasound;Therapeutic activities;Therapeutic exercise;Dry needling;Taping;Manual techniques;Passive range of motion;Patient/family education   PT Next Visit Plan review HEP and update PRN, trial manual traction, soft tissue work on upper trap/ sub-occipitals, thoracic mobility, modalities PRN   PT Home Exercise Plan upper trap stretch, chin tuck, upper cervical rotation, seated thoracic rotation    Consulted and Agree with Plan of Care Patient      Patient will benefit from skilled therapeutic intervention in order to improve the following deficits and impairments:  Abnormal gait, Pain, Obesity, Improper  body mechanics, Postural dysfunction, Decreased endurance, Decreased activity tolerance, Decreased range of motion, Decreased strength, Decreased mobility, Increased fascial restricitons, Increased muscle spasms  Visit Diagnosis: Cervicalgia - Plan: PT plan of care cert/re-cert  Other muscle spasm - Plan: PT plan of care cert/re-cert  Abnormal posture - Plan: PT plan of care cert/re-cert  Other abnormalities of gait and mobility - Plan: PT plan of care cert/re-cert      G-Codes - 01/65/53 7482    Functional Assessment Tool Used (Outpatient Only) ROM, pain, FOTO/ Clincal judgement   Functional Limitation Changing and maintaining body position   Changing and Maintaining Body Position Current Status (L0786) At least 60 percent but less than 80 percent impaired, limited or restricted   Changing and Maintaining Body Position Goal Status (L5449) At least 40 percent but less than 60 percent impaired, limited or restricted       Problem List Patient Active Problem List   Diagnosis Date Noted  . Stress incontinence 05/08/2017  . Cervical radiculopathy 05/08/2017  . CKD (chronic kidney disease) stage 3, GFR 30-59 ml/min 04/04/2017  . Vitamin D deficiency 04/04/2017  . Controlled type 2 diabetes mellitus without complication, without long-term current use of insulin ([place]) 04/04/2017  . Fibromyalgia 03/28/2016  . Multiple sclerosis ([place]) 11/02/2015  . Tobacco abuse 04/24/2014  . Back muscle spasm 01/28/2014  . Migraine headache 07/23/2013  . Essential hypertension 06/27/2013  . Chronic pain syndrome 06/27/2013  . Obesity, unspecified 06/27/2013   Starr Lake PT, DPT, LAT, ATC  06/09/17  10:01 AM      [facility] [address] [place], [place], [address] Phone: [phone]   Fax:  [phone]  Name: Tiffany Brady MRN: [id number] Date of Birth: 12-13-1960

## 2017-06-14 ENCOUNTER — Ambulatory Visit: Payer: Medicare Other

## 2017-06-16 ENCOUNTER — Ambulatory Visit: Payer: Medicare Other | Admitting: Physical Therapy

## 2017-06-19 ENCOUNTER — Ambulatory Visit: Payer: Medicare Other | Admitting: Physical Therapy

## 2017-06-21 ENCOUNTER — Encounter: Payer: Medicare Other | Admitting: Physical Therapy

## 2017-06-22 ENCOUNTER — Encounter (INDEPENDENT_AMBULATORY_CARE_PROVIDER_SITE_OTHER): Payer: Medicare Other | Admitting: Physical Medicine and Rehabilitation

## 2017-06-26 ENCOUNTER — Telehealth: Payer: Self-pay | Admitting: Physical Therapy

## 2017-06-26 ENCOUNTER — Encounter: Payer: Self-pay | Admitting: Pharmacist

## 2017-06-26 ENCOUNTER — Ambulatory Visit: Payer: Medicare Other | Attending: Internal Medicine | Admitting: Physical Therapy

## 2017-06-26 DIAGNOSIS — R2689 Other abnormalities of gait and mobility: Secondary | ICD-10-CM | POA: Insufficient documentation

## 2017-06-26 DIAGNOSIS — R293 Abnormal posture: Secondary | ICD-10-CM | POA: Insufficient documentation

## 2017-06-26 DIAGNOSIS — M542 Cervicalgia: Secondary | ICD-10-CM | POA: Insufficient documentation

## 2017-06-26 DIAGNOSIS — M62838 Other muscle spasm: Secondary | ICD-10-CM | POA: Insufficient documentation

## 2017-06-26 NOTE — Telephone Encounter (Signed)
Spoke to patient regarding no-show this morning. She states she already called and spoke to someone. She overslept and called to cancel her appointment.

## 2017-06-26 NOTE — Progress Notes (Signed)
Prior authorization request received for Voltaren Gel. [employer] only covers for osteoarthritis. Submitted that patient was using it for pain and oral NSAIDs were contraindicated due to CKD. Awaiting response from [employer] - can take up to 72 hours

## 2017-06-28 ENCOUNTER — Ambulatory Visit (INDEPENDENT_AMBULATORY_CARE_PROVIDER_SITE_OTHER): Payer: Medicare Other | Admitting: Orthopaedic Surgery

## 2017-06-29 ENCOUNTER — Ambulatory Visit: Payer: Medicare Other | Admitting: Physical Therapy

## 2017-06-29 DIAGNOSIS — M542 Cervicalgia: Secondary | ICD-10-CM

## 2017-06-29 DIAGNOSIS — R2689 Other abnormalities of gait and mobility: Secondary | ICD-10-CM

## 2017-06-29 DIAGNOSIS — M62838 Other muscle spasm: Secondary | ICD-10-CM | POA: Diagnosis not present

## 2017-06-29 DIAGNOSIS — R293 Abnormal posture: Secondary | ICD-10-CM | POA: Diagnosis not present

## 2017-06-29 NOTE — Therapy (Signed)
[place], [place], [address] Phone: [phone]   Fax:  [phone]  Physical Therapy Treatment  Patient Details  Name: Tiffany Brady MRN: [id number] Date of Birth: 1961/08/28 Referring Provider: Zollie Beckers MD  Encounter Date: 06/29/2017      PT End of Session - 06/29/17 0933    Visit Number 2   Number of Visits 17   Date for PT Re-Evaluation 08/04/17   Authorization Type MCR: KX mod by 15th visit, progress note byt 10th visit   PT Start Time 0930   PT Stop Time 1025   PT Time Calculation (min) 55 min      Past Medical History:  Diagnosis Date  . Chronic kidney disease    pt reported kidney failure  . Chronic pain syndrome   . Gout   . Hypertension   . MS (multiple sclerosis) ([place])   . Tobacco use     Past Surgical History:  Procedure Laterality Date  . FOOT SURGERY Right     There were no vitals filed for this visit.      Subjective Assessment - 06/29/17 0931    Subjective I am in a 10/10 pain all the time.    Currently in Pain? Yes   Pain Score 10-Worst pain ever   Pain Location Neck   Pain Orientation Right;Left   Aggravating Factors  doing hair, wiping self, everything    Pain Relieving Factors sit up and rock                         [facility] Adult PT Treatment/Exercise - 06/29/17 0001      Self-Care   Self-Care Posture;Other Self-Care Comments   Posture Seated posture education with retrun demonstration   Other Self-Care Comments  Sleep positions for neutral spine, use of bolster      Neck Exercises: Seated   Neck Retraction --  1 x 10   Other Seated Exercise seated scap squeeze and shoulder rolls    Other Seated Exercise upper cervical rotation 2 x 10, also AROM side bending x 4 each way   using hand on chest for tactile cues     Modalities   Modalities Moist Heat     Moist Heat Therapy   Number Minutes Moist Heat 15 Minutes   Moist Heat  Location Cervical     Manual Therapy   Manual Therapy Soft tissue mobilization;Passive ROM;Manual Traction;Other (comment)   Soft tissue mobilization gentle upper trap, cervical paraspinals, and suboccipitals   Passive ROM side bend and rotation gently   Manual Traction cervical distraction                   PT Short Term Goals - 06/09/17 0942      PT SHORT TERM GOAL #1   Title pt to be I with inital HEp    Time 4   Period Weeks   Status New   Target Date 07/07/17     PT SHORT TERM GOAL #2   Title pt to demonstrate proper posture with sitting/ standing and lifting mechanics to prevent and reduce neck pain   Time 4   Period Weeks   Status New   Target Date 07/07/17     PT SHORT TERM GOAL #3   Title pt to decrease cervical muslce spasm to reduce pain to </= 7/10 and promote cervical mobility    Time 4   Period Weeks  Status New   Target Date 07/07/17           PT Long Term Goals - 06/09/17 0946      PT LONG TERM GOAL #1   Title pt to increase cervical mobility by >/= 15 degrees for flexion, 10 degrees extension / sidebending and rotation to assist with ADLs and safety with driving    Time 8   Period Weeks   Target Date 08/04/17     PT LONG TERM GOAL #2   Title pt to increase bil UE strength to >/= 4/5 to assist with functional strength required for lifting and carrying activities.   Time 8   Period Weeks   Status New   Target Date 08/04/17     PT LONG TERM GOAL #3   Title pt to report decreased N/T and pain down bil UE to demonstrate improvement in condition for QOL   Time 8   Period Weeks   Status New   Target Date 08/04/17     PT LONG TERM GOAL #4   Title pt to increase FOTO score to </= 59% limited to demonstrate improvement in function   Time 8   Period Weeks   Status New   Target Date 08/04/17     PT LONG TERM GOAL #5   Title pt to be I with all HEP given as of last visit    Time 8   Period Weeks   Status New   Target Date 08/04/17                Plan - 06/29/17 1257    Clinical Impression Statement Pt responded well to treatment. SHe has pain with all motions but says they feel good at the same time.  Time spent educating on neutral spine sleep positions,posture and importance of postural exercises. She felt pain relief with cervical distraction. Tolerated gentle massage in supine. .Trial of HMP.    PT Next Visit Plan assess tolerance to this session; review HEP and update PRN, trial manual traction, soft tissue work on upper trap/ sub-occipitals, thoracic mobility, modalities PRN   Consulted and Agree with Plan of Care Patient      Patient will benefit from skilled therapeutic intervention in order to improve the following deficits and impairments:  Abnormal gait, Pain, Obesity, Improper body mechanics, Postural dysfunction, Decreased endurance, Decreased activity tolerance, Decreased range of motion, Decreased strength, Decreased mobility, Increased fascial restricitons, Increased muscle spasms  Visit Diagnosis: Cervicalgia  Other muscle spasm  Abnormal posture  Other abnormalities of gait and mobility     Problem List Patient Active Problem List   Diagnosis Date Noted  . Stress incontinence 05/08/2017  . Cervical radiculopathy 05/08/2017  . CKD (chronic kidney disease) stage 3, GFR 30-59 ml/min ([facility]) 04/04/2017  . Vitamin D deficiency 04/04/2017  . Controlled type 2 diabetes mellitus without complication, without long-term current use of insulin ([place]) 04/04/2017  . Fibromyalgia 03/28/2016  . Multiple sclerosis ([place]) 11/02/2015  . Tobacco abuse 04/24/2014  . Back muscle spasm 01/28/2014  . Migraine headache 07/23/2013  . Essential hypertension 06/27/2013  . Chronic pain syndrome 06/27/2013  . Obesity, unspecified 06/27/2013    Dorene Ar, PTA 06/29/2017, 1:02 PM  [facility] [address] [place], [place], [address] Phone:  [phone]   Fax:  [phone]  Name: Tiffany Brady MRN: [id number] Date of Birth: Jan 15, 1961

## 2017-07-03 ENCOUNTER — Encounter (INDEPENDENT_AMBULATORY_CARE_PROVIDER_SITE_OTHER): Payer: Self-pay | Admitting: Physical Medicine and Rehabilitation

## 2017-07-03 ENCOUNTER — Ambulatory Visit (INDEPENDENT_AMBULATORY_CARE_PROVIDER_SITE_OTHER): Payer: Medicare Other

## 2017-07-03 ENCOUNTER — Ambulatory Visit (INDEPENDENT_AMBULATORY_CARE_PROVIDER_SITE_OTHER): Payer: Medicare Other | Admitting: Physical Medicine and Rehabilitation

## 2017-07-03 VITALS — BP 140/87 | HR 89

## 2017-07-03 DIAGNOSIS — M5412 Radiculopathy, cervical region: Secondary | ICD-10-CM

## 2017-07-03 DIAGNOSIS — G35 Multiple sclerosis: Secondary | ICD-10-CM | POA: Diagnosis not present

## 2017-07-03 DIAGNOSIS — M797 Fibromyalgia: Secondary | ICD-10-CM

## 2017-07-03 MED ORDER — LIDOCAINE HCL (PF) 1 % IJ SOLN: 2.0000 mL | Freq: Once | INTRAMUSCULAR | Status: DC

## 2017-07-03 MED ORDER — BETAMETHASONE SOD PHOS & ACET 6 (3-3) MG/ML IJ SUSP
12.0000 mg | Freq: Once | INTRAMUSCULAR | Status: AC
  Administered 2017-07-03: 12 mg

## 2017-07-03 NOTE — Patient Instructions (Signed)

## 2017-07-03 NOTE — Progress Notes (Signed)
BILATERAL SHOULDER PAIN RADIATING DOWN ARMS LEGS. PATIENT STATES THAT SHE DOES HAVE M.S.

## 2017-07-04 ENCOUNTER — Ambulatory Visit: Payer: Medicare Other | Admitting: Physical Therapy

## 2017-07-04 NOTE — Procedures (Signed)
Tiffany Brady is a 56 year old right-hand dominant female sent here by Dr. Ninfa Linden for cervical epidural injection. She had cervical epidural injection 2016 with good results in terms of decreasing her pain for quite some time. Her MRI at the time showed really just generalized spondylosis without frank nerve compression. She reports pain in both shoulders down both arms and legs. She has significant fibromyalgia and history of multiple sclerosis. She has been going to physical therapy and has had medication management and I think it is time to complete the epidural injection again diagnostically and hopefully therapeutically.  Cervical Epidural Steroid Injection - Interlaminar Approach with Fluoroscopic Guidance  Patient: Tiffany Brady      Date of Birth: 1961/07/17 MRN: [id number] PCP: Ladell Pier, MD      Visit Date: 07/03/2017   Universal Protocol:    Date/Time: 10/23/185:25 AM  Consent Given By: the patient  Position: PRONE  Additional Comments: Vital signs were monitored before and after the procedure. Patient was prepped and draped in the usual sterile fashion. The correct patient, procedure, and site was verified.   Injection Procedure Details:  Procedure Site One Meds Administered:  Meds ordered this encounter  Medications  . lidocaine (PF) (XYLOCAINE) 1 % injection 2 mL  . betamethasone acetate-betamethasone sodium phosphate (CELESTONE) injection 12 mg     Laterality: Left  Location/Site:  C7-T1  Needle size: 20 G  Needle type: Touhy  Needle Placement: Paramedian epidural space  Findings:  -Contrast Used: 0.5 mL iohexol 180 mg iodine/mL   -Comments: Excellent flow of contrast into the epidural space.  Procedure Details: Using a paramedian approach from the side mentioned above, the region overlying the inferior lamina was localized under fluoroscopic visualization and the soft tissues overlying this structure were infiltrated with 4 ml. of 1% Lidocaine  without Epinephrine. A # 20 gauge, Tuohy needle was inserted into the epidural space using a paramedian approach.  The epidural space was localized using loss of resistance along with lateral and contralateral oblique bi-planar fluoroscopic views.  After negative aspirate for air, blood, and CSF, a 2 ml. volume of Isovue-250 was injected into the epidural space and the flow of contrast was observed. Radiographs were obtained for documentation purposes.   The injectate was administered into the level noted above.  Additional Comments:  The patient tolerated the procedure well Dressing: Band-Aid    Post-procedure details: Patient was observed during the procedure. Post-procedure instructions were reviewed.  Patient left the clinic in stable condition.

## 2017-07-05 ENCOUNTER — Ambulatory Visit
Admission: RE | Admit: 2017-07-05 | Discharge: 2017-07-05 | Disposition: A | Discharge status: Home / Self Care | Payer: Medicare Other | Source: Ambulatory Visit | Attending: Internal Medicine | Admitting: Internal Medicine

## 2017-07-05 DIAGNOSIS — Z1231 Encounter for screening mammogram for malignant neoplasm of breast: Secondary | ICD-10-CM

## 2017-07-06 ENCOUNTER — Ambulatory Visit: Payer: Medicare Other | Admitting: Physical Therapy

## 2017-07-06 DIAGNOSIS — M542 Cervicalgia: Secondary | ICD-10-CM

## 2017-07-06 DIAGNOSIS — R293 Abnormal posture: Secondary | ICD-10-CM

## 2017-07-06 DIAGNOSIS — M62838 Other muscle spasm: Secondary | ICD-10-CM

## 2017-07-06 DIAGNOSIS — R2689 Other abnormalities of gait and mobility: Secondary | ICD-10-CM

## 2017-07-06 NOTE — Therapy (Addendum)
[place], [place], [address] Phone: [phone]   Fax:  [phone]  Physical Therapy Treatment / Discharge Summary  Patient Details  Name: Tiffany Brady MRN: [id number] Date of Birth: 08-24-1961 Referring Provider: Zollie Beckers MD  Encounter Date: 07/06/2017      PT End of Session - 07/06/17 0852    Visit Number 3   Number of Visits 17   Date for PT Re-Evaluation 08/04/17   Authorization Type MCR: KX mod by 15th visit, progress note byt 10th visit   PT Start Time 0845   PT Stop Time 0940   PT Time Calculation (min) 55 min      Past Medical History:  Diagnosis Date  . Chronic kidney disease    pt reported kidney failure  . Chronic pain syndrome   . Gout   . Hypertension   . MS (multiple sclerosis) ([place])   . Tobacco use     Past Surgical History:  Procedure Laterality Date  . FOOT SURGERY Right     There were no vitals filed for this visit.      Subjective Assessment - 07/06/17 0847    Subjective I got my injection. I can move a little better.    Currently in Pain? Yes   Pain Score 8    Pain Location Neck   Pain Orientation Right;Left   Pain Radiating Towards pain and N/T in bil arms wrist and hands.                          [facility] Adult PT Treatment/Exercise - 07/06/17 0001      Neck Exercises: Seated   Neck Retraction --  1 x 10   Other Seated Exercise seated scap squeeze and shoulder rolls    Other Seated Exercise upper cervical rotation 2 x 10, also AROM side bending x 4 each way   using hand on chest for tactile cues     Modalities   Modalities Traction     Moist Heat Therapy   Number Minutes Moist Heat 10 Minutes   Moist Heat Location Cervical     Traction   Type of Traction Cervical   Min (lbs) 12   Max (lbs) 5   Hold Time 60   Rest Time 20   Time 10     Manual Therapy   Soft tissue mobilization IASTM seated to upper trap , periscapular       Neck Exercises: Stretches   Upper Trapezius Stretch 2 reps;30 seconds                  PT Short Term Goals - 06/09/17 0942      PT SHORT TERM GOAL #1   Title pt to be I with inital HEp    Time 4   Period Weeks   Status New   Target Date 07/07/17     PT SHORT TERM GOAL #2   Title pt to demonstrate proper posture with sitting/ standing and lifting mechanics to prevent and reduce neck pain   Time 4   Period Weeks   Status New   Target Date 07/07/17     PT SHORT TERM GOAL #3   Title pt to decrease cervical muslce spasm to reduce pain to </= 7/10 and promote cervical mobility    Time 4   Period Weeks   Status New   Target Date 07/07/17  PT Long Term Goals - 06/09/17 0946      PT LONG TERM GOAL #1   Title pt to increase cervical mobility by >/= 15 degrees for flexion, 10 degrees extension / sidebending and rotation to assist with ADLs and safety with driving    Time 8   Period Weeks   Target Date 08/04/17     PT LONG TERM GOAL #2   Title pt to increase bil UE strength to >/= 4/5 to assist with functional strength required for lifting and carrying activities.   Time 8   Period Weeks   Status New   Target Date 08/04/17     PT LONG TERM GOAL #3   Title pt to report decreased N/T and pain down bil UE to demonstrate improvement in condition for QOL   Time 8   Period Weeks   Status New   Target Date 08/04/17     PT LONG TERM GOAL #4   Title pt to increase FOTO score to </= 59% limited to demonstrate improvement in function   Time 8   Period Weeks   Status New   Target Date 08/04/17     PT LONG TERM GOAL #5   Title pt to be I with all HEP given as of last visit    Time 8   Period Weeks   Status New   Target Date 08/04/17               Plan - 07/06/17 0909    Clinical Impression Statement Pt reports hurting a little after last treatment. She has tried a couple of the exercises, prefers side bending. We reviewed her postural HEP  and stretches. Performed IASTM seated to upper traps and scapular muscles. Trial of cervical traction today follwed by HMP. Pt reports feeling better at end of session and does not feel any pain. Pt will see othopedic next weeks and wants to HOLD until she speaks to him about her LE pain. She will call and schedule if he recommends her to do so. I asked her to schedule within 30 days.    PT Next Visit Plan assess tolerance to this session; review HEP and update PRN, trial manual traction, soft tissue work on upper trap/ sub-occipitals, thoracic mobility, modalities PRN   PT Home Exercise Plan upper trap stretch, chin tuck, upper cervical rotation, seated thoracic rotation    Consulted and Agree with Plan of Care Patient      Patient will benefit from skilled therapeutic intervention in order to improve the following deficits and impairments:  Abnormal gait, Pain, Obesity, Improper body mechanics, Postural dysfunction, Decreased endurance, Decreased activity tolerance, Decreased range of motion, Decreased strength, Decreased mobility, Increased fascial restricitons, Increased muscle spasms  Visit Diagnosis: Cervicalgia  Other muscle spasm  Abnormal posture  Other abnormalities of gait and mobility     Problem List Patient Active Problem List   Diagnosis Date Noted  . Stress incontinence 05/08/2017  . Cervical radiculopathy 05/08/2017  . CKD (chronic kidney disease) stage 3, GFR 30-59 ml/min (HCC) 04/04/2017  . Vitamin D deficiency 04/04/2017  . Controlled type 2 diabetes mellitus without complication, without long-term current use of insulin ([place]) 04/04/2017  . Fibromyalgia 03/28/2016  . Multiple sclerosis ([place]) 11/02/2015  . Tobacco abuse 04/24/2014  . Back muscle spasm 01/28/2014  . Migraine headache 07/23/2013  . Essential hypertension 06/27/2013  . Chronic pain syndrome 06/27/2013  . Obesity, unspecified 06/27/2013    Dorene Ar, PTA 07/06/2017, 9:49  AM  [facility] [address] [place], [place], [address] Phone: [phone]   Fax:  [phone]  Name: Raegan Winders MRN: [id number] Date of Birth: January 10, 1961       PHYSICAL THERAPY DISCHARGE SUMMARY  Visits from Start of Care: 3  Current functional level related to goals / functional outcomes: See goals   Remaining deficits: unknown   Education / Equipment: HEP  Plan: Patient agrees to discharge.  Patient goals were not met. Patient is being discharged due to not returning since the last visit.  ?????    Kristoffer Leamon PT, DPT, LAT, ATC  09/19/17  1:32 PM

## 2017-07-10 ENCOUNTER — Other Ambulatory Visit (INDEPENDENT_AMBULATORY_CARE_PROVIDER_SITE_OTHER): Payer: Self-pay

## 2017-07-10 ENCOUNTER — Telehealth (INDEPENDENT_AMBULATORY_CARE_PROVIDER_SITE_OTHER): Payer: Self-pay | Admitting: Orthopaedic Surgery

## 2017-07-10 ENCOUNTER — Ambulatory Visit (INDEPENDENT_AMBULATORY_CARE_PROVIDER_SITE_OTHER): Payer: Medicare Other | Admitting: Orthopaedic Surgery

## 2017-07-10 DIAGNOSIS — M797 Fibromyalgia: Secondary | ICD-10-CM

## 2017-07-10 DIAGNOSIS — M5412 Radiculopathy, cervical region: Secondary | ICD-10-CM | POA: Diagnosis not present

## 2017-07-10 MED ORDER — NABUMETONE 750 MG PO TABS: 750.0000 mg | ORAL_TABLET | Freq: Two times a day (BID) | ORAL | 3 refills | Status: DC

## 2017-07-10 NOTE — Progress Notes (Signed)
The patient returns for follow-up after having an interlaminar injection at [facility] by Dr. Ernestina Patches.  This was just 4 days ago so this is not had a chance to fully work.  She also has multiple sclerosis and chronic pain syndrome from fibromyalgia.  She is not sure if the injection helped yet like it in the past but is been too early to tell.  There is been no other change in her medical status except for neck pain bilateral shoulder pain and bilateral leg pain.  He embolus with a cane.  She is morbidly obese.  There is no acute findings on physical exam today other than global pain.  There is no significant deficits that I could see.  I would like to try Relafen as an anti-inflammatory 500 mg twice a day with food since she is not on any oral anti-inflammatories.  We will see her back in a month to see how she is doing overall.

## 2017-07-10 NOTE — Telephone Encounter (Signed)
Sent in to pharmacy.  

## 2017-07-10 NOTE — Telephone Encounter (Signed)
Returned call to patient advised the pharmacy is [facility] on [address]. The number to contact patient is [phone]

## 2017-07-10 NOTE — Telephone Encounter (Signed)
Please send in Relafen 500 mg 1 p.o. twice daily with meals as needed for pain and inflammation.  #60 with 3 refills.

## 2017-07-10 NOTE — Telephone Encounter (Signed)
Want me to send the Naprosyn into this pharmacy that you mentioned today?

## 2017-08-07 ENCOUNTER — Ambulatory Visit (INDEPENDENT_AMBULATORY_CARE_PROVIDER_SITE_OTHER): Payer: Medicare Other | Admitting: Orthopaedic Surgery

## 2017-08-21 ENCOUNTER — Ambulatory Visit (INDEPENDENT_AMBULATORY_CARE_PROVIDER_SITE_OTHER): Payer: Medicare Other | Admitting: Orthopaedic Surgery

## 2017-08-28 ENCOUNTER — Ambulatory Visit (INDEPENDENT_AMBULATORY_CARE_PROVIDER_SITE_OTHER): Payer: Medicare Other | Admitting: Orthopaedic Surgery

## 2017-09-11 ENCOUNTER — Other Ambulatory Visit: Payer: Self-pay | Admitting: Internal Medicine

## 2017-09-11 DIAGNOSIS — Z720 Tobacco use: Secondary | ICD-10-CM

## 2017-09-11 DIAGNOSIS — G894 Chronic pain syndrome: Secondary | ICD-10-CM

## 2017-09-15 ENCOUNTER — Other Ambulatory Visit: Payer: Self-pay | Admitting: Internal Medicine

## 2017-09-15 DIAGNOSIS — G894 Chronic pain syndrome: Secondary | ICD-10-CM

## 2017-09-19 ENCOUNTER — Ambulatory Visit: Payer: Medicare Other | Attending: Internal Medicine | Admitting: Internal Medicine

## 2017-09-19 ENCOUNTER — Encounter: Payer: Self-pay | Admitting: Internal Medicine

## 2017-09-19 VITALS — BP 142/80 | HR 79 | Temp 98.5°F | Resp 16 | Wt 256.4 lb

## 2017-09-19 DIAGNOSIS — Z91018 Allergy to other foods: Secondary | ICD-10-CM | POA: Insufficient documentation

## 2017-09-19 DIAGNOSIS — G35 Multiple sclerosis: Secondary | ICD-10-CM | POA: Insufficient documentation

## 2017-09-19 DIAGNOSIS — E1122 Type 2 diabetes mellitus with diabetic chronic kidney disease: Secondary | ICD-10-CM | POA: Insufficient documentation

## 2017-09-19 DIAGNOSIS — Z7982 Long term (current) use of aspirin: Secondary | ICD-10-CM | POA: Insufficient documentation

## 2017-09-19 DIAGNOSIS — I129 Hypertensive chronic kidney disease with stage 1 through stage 4 chronic kidney disease, or unspecified chronic kidney disease: Secondary | ICD-10-CM | POA: Insufficient documentation

## 2017-09-19 DIAGNOSIS — I10 Essential (primary) hypertension: Secondary | ICD-10-CM | POA: Diagnosis not present

## 2017-09-19 DIAGNOSIS — M5412 Radiculopathy, cervical region: Secondary | ICD-10-CM | POA: Diagnosis not present

## 2017-09-19 DIAGNOSIS — M797 Fibromyalgia: Secondary | ICD-10-CM | POA: Diagnosis not present

## 2017-09-19 DIAGNOSIS — Z79899 Other long term (current) drug therapy: Secondary | ICD-10-CM | POA: Insufficient documentation

## 2017-09-19 DIAGNOSIS — Z6841 Body Mass Index (BMI) 40.0 and over, adult: Secondary | ICD-10-CM | POA: Diagnosis not present

## 2017-09-19 DIAGNOSIS — G894 Chronic pain syndrome: Secondary | ICD-10-CM | POA: Insufficient documentation

## 2017-09-19 DIAGNOSIS — Z8249 Family history of ischemic heart disease and other diseases of the circulatory system: Secondary | ICD-10-CM | POA: Insufficient documentation

## 2017-09-19 DIAGNOSIS — F17210 Nicotine dependence, cigarettes, uncomplicated: Secondary | ICD-10-CM | POA: Insufficient documentation

## 2017-09-19 DIAGNOSIS — Z820 Family history of epilepsy and other diseases of the nervous system: Secondary | ICD-10-CM | POA: Insufficient documentation

## 2017-09-19 DIAGNOSIS — E119 Type 2 diabetes mellitus without complications: Secondary | ICD-10-CM

## 2017-09-19 DIAGNOSIS — N183 Chronic kidney disease, stage 3 unspecified: Secondary | ICD-10-CM

## 2017-09-19 DIAGNOSIS — E669 Obesity, unspecified: Secondary | ICD-10-CM | POA: Insufficient documentation

## 2017-09-19 DIAGNOSIS — F17200 Nicotine dependence, unspecified, uncomplicated: Secondary | ICD-10-CM

## 2017-09-19 LAB — POCT GLYCOSYLATED HEMOGLOBIN (HGB A1C): HEMOGLOBIN A1C: 6.4

## 2017-09-19 LAB — GLUCOSE, POCT (MANUAL RESULT ENTRY): POC GLUCOSE: 106 mg/dL — AB (ref 70–99)

## 2017-09-19 MED ORDER — TRAMADOL HCL 50 MG PO TABS: 50.0000 mg | ORAL_TABLET | Freq: Two times a day (BID) | ORAL | 1 refills | Status: DC | PRN

## 2017-09-19 MED ORDER — NICOTINE 14 MG/24HR TD PT24: 14.0000 mg | MEDICATED_PATCH | Freq: Every day | TRANSDERMAL | 0 refills | Status: DC

## 2017-09-19 MED ORDER — NICOTINE 21 MG/24HR TD PT24: 21.0000 mg | MEDICATED_PATCH | Freq: Every day | TRANSDERMAL | 0 refills | Status: DC

## 2017-09-19 NOTE — Progress Notes (Signed)
Patient ID: Tiffany Brady, female    DOB: 09-04-1961  MRN: [id number]  CC: Medication Refill; Diabetes; and Hypertension   Subjective: Tiffany Brady is a 57 y.o. female who presents for chronic ds management. Last seen 03/2017 Her concerns today include:  Pt with hx of DM tyoe 2, HTN, Tob, obesity, fibromyalgia, CKD stage 3 and MS.   1. Had all top teeth pulled 07/2017 -having to eat soft foods until she is fitted and receives dentures  2. DM: does not check BS. Not on any meds; she has been diet controlled  3. HTN: compliant with Losartan/HCTZ and salt restriction. No CP or SOB  4. MS: was having some dizziness several 3 wks ago. She felt one of her meds may have been causing it so she stopped taking all of her med except BP med.  She stopped the Tecifdera and Vit D.  Has appt on Friday with her neurologist -she stopped Vesicare which was prescribed last visit for incontinence  5.  Cervical Radiculopathy:  -she has seen Dr. Ninfa Linden. Had inj to cervical spine. Did not help as much as it did in the past -given rxn for Relafen but her insurance did not cover Takes Flexeril and Tylenol which help a little  6. Smoking less than 1/2 pk a day. Trying to quit  Patient Active Problem List   Diagnosis Date Noted  . Stress incontinence 05/08/2017  . Cervical radiculopathy 05/08/2017  . CKD (chronic kidney disease) stage 3, GFR 30-59 ml/min (HCC) 04/04/2017  . Vitamin D deficiency 04/04/2017  . Controlled type 2 diabetes mellitus without complication, without long-term current use of insulin ([place]) 04/04/2017  . Fibromyalgia 03/28/2016  . Multiple sclerosis ([place]) 11/02/2015  . Tobacco abuse 04/24/2014  . Back muscle spasm 01/28/2014  . Migraine headache 07/23/2013  . Essential hypertension 06/27/2013  . Chronic pain syndrome 06/27/2013  . Obesity, unspecified 06/27/2013     Current Outpatient Medications on File Prior to Visit  Medication Sig Dispense Refill  .  losartan-hydrochlorothiazide (HYZAAR) 100-25 MG tablet Take 0.5 tablets by mouth daily. 30 tablet 6  . aspirin EC 81 MG tablet Take 1 tablet (81 mg total) by mouth daily. (Patient not taking: Reported on 09/19/2017) 100 tablet 1  . Blood Glucose Monitoring Suppl (TRUE METRIX GO GLUCOSE METER) w/Device KIT 1 each by Does not apply route every 8 (eight) hours as needed. (Patient not taking: Reported on 09/19/2017) 1 kit 0  . cyclobenzaprine (FLEXERIL) 10 MG tablet TAKE 1 TABLET BY MOUTH TWICE A DAY AS NEEDED FOR MUSCLE SPASMS (Patient not taking: Reported on 09/19/2017) 60 tablet 0  . diclofenac sodium (VOLTAREN) 1 % GEL Apply 2 g topically 4 (four) times daily. (Patient not taking: Reported on 09/19/2017) 100 g 2  . Dimethyl Fumarate (TECFIDERA) 240 MG CPDR Take 240 mg by mouth 2 (two) times daily.    Marland Kitchen glucose blood (TRUE METRIX BLOOD GLUCOSE TEST) test strip Use as instructed (Patient not taking: Reported on 09/19/2017) 100 each 12  . TRUEPLUS LANCETS 26G MISC 1 each by Does not apply route every 8 (eight) hours as needed. (Patient not taking: Reported on 09/19/2017) 100 each 12   No current facility-administered medications on file prior to visit.     Allergies  Allergen Reactions  . Pork-Derived Metallurgist    Social History   Socioeconomic History  . Marital status: Widowed    Spouse name: Not on file  . Number of children: Not on file  .  Years of education: Not on file  . Highest education level: Not on file  Social Needs  . Financial resource strain: Not on file  . Food insecurity - worry: Not on file  . Food insecurity - inability: Not on file  . Transportation needs - medical: Not on file  . Transportation needs - non-medical: Not on file  Occupational History  . Not on file  Tobacco Use  . Smoking status: Current Every Day Smoker    Packs/day: 0.75    Years: 40.00    Pack years: 30.00    Types: Cigarettes  . Smokeless tobacco: Never Used  Substance and Sexual Activity  .  Alcohol use: Yes    Alcohol/week: 0.0 oz    Comment: occasional  . Drug use: No  . Sexual activity: Not on file  Other Topics Concern  . Not on file  Social History Narrative   Lives with niece and her 3 children in a 2 story home.     Only goes upstairs once a day.  Has 1 child.  Does not work.  Trying to get disability.  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     Education: 10th grade.    Family History  Problem Relation Age of Onset  . Cancer Mother        Deceased  . Hypertension Mother   . Other Father        Deceased, 35  . HIV/AIDS Brother        Deceased  . Multiple sclerosis Daughter     Past Surgical History:  Procedure Laterality Date  . FOOT SURGERY Right     ROS: Review of Systems Neg except as above PHYSICAL EXAM: BP (!) 142/80   Pulse 79   Temp 98.5 F (36.9 C) (Oral)   Resp 16   Wt 256 lb 6.4 oz (116.3 kg)   LMP 04/21/2014   SpO2 96%   BMI 44.01 kg/m   Wt Readings from Last 3 Encounters:  09/19/17 256 lb 6.4 oz (116.3 kg)  05/08/17 259 lb (117.5 kg)  04/19/17 260 lb 3.2 oz (118 kg)  Repeat BP 142/80  Physical Exam  General appearance - alert, well appearing, obese female and in no distress Mental status - alert, oriented to person, place, and time, normal mood, behavior, speech, dress, motor activity, and thought processes Chest - clear to auscultation, no wheezes, rales or rhonchi, symmetric air entry Heart - normal rate, regular rhythm, normal S1, S2, no murmurs, rubs, clicks or gallops Musculoskeletal - ambulates with a cane Extremities - no LE edema  Results for orders placed or performed in visit on 09/19/17  POCT glucose (manual entry)  Result Value Ref Range   POC Glucose 106 (A) 70 - 99 mg/dl  POCT glycosylated hemoglobin (Hb A1C)  Result Value Ref Range   Hemoglobin A1C 6.4     ASSESSMENT AND PLAN: 1. Controlled type 2 diabetes mellitus without complication, without long-term current use of insulin  (HCC) -encouraged healthy eating habits and exercise as tolerated - POCT glucose (manual entry) - POCT glycosylated hemoglobin (Hb A1C)  2. CKD (chronic kidney disease) stage 3, GFR 30-59 ml/min (HCC) -advised against NSAIDS due to kidney function. She never filled rxn for Relafen  3. Essential hypertension Close to goal. Continue Losartan/HCTZ  4. Tobacco dependence Patient advised to quit smoking. Discussed health risks associated with smoking including lung and other types of cancers, chronic lung diseases and CV risks.. Pt ready  to give trail of quitting.  Discussed methods to help quit including quitting cold [place], use of NRT, Chantix and Bupropion.  - nicotine (NICODERM CQ - DOSED IN MG/24 HOURS) 21 mg/24hr patch; Place 1 patch (21 mg total) onto the skin daily.  Dispense: 28 patch; Refill: 0 - nicotine (NICODERM CQ - DOSED IN MG/24 HOURS) 14 mg/24hr patch; Place 1 patch (14 mg total) onto the skin daily.  Dispense: 28 patch; Refill: 0  5. Cervical radiculopathy Discussed trying her with Tramadol to use PRN. However when we discuss putting her on control substance prescribing agreement, pt declined rxn stating that she smokes marijuana and does not wish to give it up  6. Multiple sclerosis ([place]) Keep appt with neurologist later this wk  Patient was given the opportunity to ask questions.  Patient verbalized understanding of the plan and was able to repeat key elements of the plan.   Orders Placed This Encounter  Procedures  . POCT glucose (manual entry)  . POCT glycosylated hemoglobin (Hb A1C)     Requested Prescriptions   Signed Prescriptions Disp Refills  . nicotine (NICODERM CQ - DOSED IN MG/24 HOURS) 21 mg/24hr patch 28 patch 0    Sig: Place 1 patch (21 mg total) onto the skin daily.  . nicotine (NICODERM CQ - DOSED IN MG/24 HOURS) 14 mg/24hr patch 28 patch 0    Sig: Place 1 patch (14 mg total) onto the skin daily.  . traMADol (ULTRAM) 50 MG tablet 60 tablet 1     Sig: Take 1 tablet (50 mg total) by mouth every 12 (twelve) hours as needed.    Return in about 4 months (around 01/17/2018).  Karle Plumber, MD, FACP

## 2017-09-22 ENCOUNTER — Ambulatory Visit (INDEPENDENT_AMBULATORY_CARE_PROVIDER_SITE_OTHER): Payer: Medicare Other | Admitting: Neurology

## 2017-09-22 ENCOUNTER — Encounter: Payer: Self-pay | Admitting: Neurology

## 2017-09-22 ENCOUNTER — Other Ambulatory Visit (INDEPENDENT_AMBULATORY_CARE_PROVIDER_SITE_OTHER): Payer: Medicare Other

## 2017-09-22 VITALS — BP 120/80 | HR 84 | Wt 259.5 lb

## 2017-09-22 DIAGNOSIS — F17210 Nicotine dependence, cigarettes, uncomplicated: Secondary | ICD-10-CM | POA: Diagnosis not present

## 2017-09-22 DIAGNOSIS — G35 Multiple sclerosis: Secondary | ICD-10-CM

## 2017-09-22 DIAGNOSIS — G5603 Carpal tunnel syndrome, bilateral upper limbs: Secondary | ICD-10-CM | POA: Diagnosis not present

## 2017-09-22 DIAGNOSIS — Z720 Tobacco use: Secondary | ICD-10-CM

## 2017-09-22 DIAGNOSIS — G35A Relapsing-remitting multiple sclerosis: Secondary | ICD-10-CM

## 2017-09-22 LAB — CBC WITH DIFFERENTIAL/PLATELET
BASOS PCT: 1.3 % (ref 0.0–3.0)
Basophils Absolute: 0.1 10*3/uL (ref 0.0–0.1)
EOS ABS: 0.6 10*3/uL (ref 0.0–0.7)
Eosinophils Relative: 8.3 % — ABNORMAL HIGH (ref 0.0–5.0)
HCT: 49.2 % — ABNORMAL HIGH (ref 36.0–46.0)
HEMOGLOBIN: 15.8 g/dL — AB (ref 12.0–15.0)
LYMPHS PCT: 36.6 % (ref 12.0–46.0)
Lymphs Abs: 2.6 10*3/uL (ref 0.7–4.0)
MCHC: 32.1 g/dL (ref 30.0–36.0)
MCV: 95.1 fl (ref 78.0–100.0)
MONOS PCT: 8.1 % (ref 3.0–12.0)
Monocytes Absolute: 0.6 10*3/uL (ref 0.1–1.0)
Neutro Abs: 3.2 10*3/uL (ref 1.4–7.7)
Neutrophils Relative %: 45.7 % (ref 43.0–77.0)
Platelets: 216 10*3/uL (ref 150.0–400.0)
RBC: 5.18 Mil/uL — ABNORMAL HIGH (ref 3.87–5.11)
RDW: 13.8 % (ref 11.5–15.5)
WBC: 7.1 10*3/uL (ref 4.0–10.5)

## 2017-09-22 LAB — COMPREHENSIVE METABOLIC PANEL
ALBUMIN: 3.9 g/dL (ref 3.5–5.2)
ALT: 14 U/L (ref 0–35)
AST: 17 U/L (ref 0–37)
Alkaline Phosphatase: 74 U/L (ref 39–117)
BUN: 16 mg/dL (ref 6–23)
CALCIUM: 10 mg/dL (ref 8.4–10.5)
CHLORIDE: 102 meq/L (ref 96–112)
CO2: 31 meq/L (ref 19–32)
CREATININE: 1.52 mg/dL — AB (ref 0.40–1.20)
GFR: 45.19 mL/min — ABNORMAL LOW (ref >60.00)
Glucose, Bld: 94 mg/dL (ref 70–99)
POTASSIUM: 4.4 meq/L (ref 3.5–5.1)
Sodium: 138 mEq/L (ref 135–145)
Total Bilirubin: 0.6 mg/dL (ref 0.2–1.2)
Total Protein: 7.9 g/dL (ref 6.0–8.3)

## 2017-09-22 NOTE — Patient Instructions (Signed)
1.  MRI brain wwo contrast 2.  Continue Tecfidera 240mg  twice daily.  You can take aspirin for flushing, if needed 3.  Check labs 4.  Proud of you for cutting back on smoking - you are 1 cigarette away from stopping!  You can do it!!  Return to clinic 6 months

## 2017-09-22 NOTE — Progress Notes (Signed)
Follow-up Visit   Date: 09/22/17   Tiffany Brady MRN: [id number] DOB: 08-23-1961   Interim History: Tiffany Brady is a 57 y.o. right-handed African American female with chronic pain syndrome, fibromyaglia, hypertension, and tobacco use returning to the clinic for follow-up of multiple sclerosis.  The patient was accompanied to the clinic by self.  History of present illness: Starting in July 2016, she began experiencing numbness/tingling over the tips of her fingers on both hands which has steadily involved the whole hand. She also has numbness over the right leg above the knees, but this is intermittent. She endorses pain related weakness and has difficulty with walking and doing daily activities such as bathing and cooking because of the pain.   She also has a lot of whole body pain described as soreness which is tender to palpation. She feels as if someone cut her and bleeding on the inside or as if someone is breaking her collar bone.  She was recently started on Cymbalta helps alleviate the pain where she can function, but it is still always there. Her ANA, RF, and TSH is normal. In October 2016, presented to her PCP's office with generalized weakness, gait difficulty, bowel and bladder incontinenced and concerned about MS due to her daughter diagnosed with this.  She underwent MRI brain which showed white matter changes involving the periventricular region and subcortical white matter, concerning for MS.  Although initial imaging also suggested cervical cord compression, subsequent imaging of her cervical spine only showed multilevel spinal stenosis without cord involvement.  She underwent CSF testing which showed normal IgG index, but there was increased oligoclonal bands both in the CSF and serum, but moreso apparent in the CSF.   She started gabapentin 349m BID but had not noticed any changes in her left arm.  She is not using wrist splints due to expense.   She complains  episodic left sided weakness and numbness occuring several times per day, lasting a few minutes. She reports having urge incontinence for the past 15 year, which is triggered by sneezing, coughing, or laughing. She does not have a regular OBGYN provider. She complains of gait difficulty but denies walks and walks unassisted.  She has chronic pain involving her whole body.  In early 2017, she was treated with 3-days of IVMP, and did not notice any difference in any of her symptoms.  She continues to complain of a lot of myalgias and generalized pain throughout (knees, hips, elbows).  She does not see pain management and PCP manages her fibromyalgia.    UPDATE 04/19/2017:   She stopped all her medications about a month ago because of being stressed with her medications.  Around 2 months ago, she started having memory changes, speech difficulty described as stuttering and word-finding difficulty.  During the same time, [place] was extremely stressed [place] [facility] [place] [facility] [place], whom [place].  [place] who ordered MRI brain, but she did not follow-up and is unsure of the results or the providers name.  [place], which has improved her state of mind and is now interested in restarting her medications.  She continues to have neck pain and whole-body pain due to fibromyalgia.   UPDATE 09/22/2017:  She is here for follow-up visit.  A few weeks ago, she developed dizziness and  stopped several medications, including Tecfidera and later found that it was another medication, so is planning on restarting it today.  She is also trying to quit smoking and is down to 1 cigarette/day!  She had recent eye exam and needs new glasses due to change in visual acuity, but has not been able to afford this yet.  She denies any new neurological symptoms.  She  continues to have generalized pain from fibromyalgia.   Medications:  Current Outpatient Medications on File Prior to Visit  Medication Sig Dispense Refill  . aspirin EC 81 MG tablet Take 1 tablet (81 mg total) by mouth daily. 100 tablet 1  . Blood Glucose Monitoring Suppl (TRUE METRIX GO GLUCOSE METER) w/Device KIT 1 each by Does not apply route every 8 (eight) hours as needed. 1 kit 0  . cyclobenzaprine (FLEXERIL) 10 MG tablet TAKE 1 TABLET BY MOUTH TWICE A DAY AS NEEDED FOR MUSCLE SPASMS 60 tablet 0  . diclofenac sodium (VOLTAREN) 1 % GEL Apply 2 g topically 4 (four) times daily. 100 g 2  . Dimethyl Fumarate (TECFIDERA) 240 MG CPDR Take 240 mg by mouth 2 (two) times daily.    Marland Kitchen glucose blood (TRUE METRIX BLOOD GLUCOSE TEST) test strip Use as instructed 100 each 12  . losartan-hydrochlorothiazide (HYZAAR) 100-25 MG tablet Take 0.5 tablets by mouth daily. 30 tablet 6  . nicotine (NICODERM CQ - DOSED IN MG/24 HOURS) 14 mg/24hr patch Place 1 patch (14 mg total) onto the skin daily. 28 patch 0  . nicotine (NICODERM CQ - DOSED IN MG/24 HOURS) 21 mg/24hr patch Place 1 patch (21 mg total) onto the skin daily. 28 patch 0  . TRUEPLUS LANCETS 26G MISC 1 each by Does not apply route every 8 (eight) hours as needed. 100 each 12   No current facility-administered medications on file prior to visit.     Allergies:  Allergies  Allergen Reactions  . Pork-Derived Products Hives    Review of Systems:  CONSTITUTIONAL: No fevers, chills, night sweats, or weight loss.  EYES: No visual changes or eye pain ENT: No hearing changes.  No history of nose bleeds.   RESPIRATORY: No cough, wheezing and shortness of breath.   CARDIOVASCULAR: Negative for chest pain, and palpitations.   GI: Negative for abdominal discomfort, blood in stools or black stools.  No recent change in bowel habits.   GU:  No history of incontinence.   MUSCLOSKELETAL: +history of joint pain or swelling.  +myalgias.   SKIN: Negative for  lesions, rash, and itching.   ENDOCRINE: Negative for cold or heat intolerance, polydipsia or goiter.   PSYCH:  No depression or anxiety symptoms.   NEURO: As Above.   Vital Signs:  BP 120/80   Pulse 84   Wt 259 lb 8 oz (117.7 kg)   LMP 04/21/2014   SpO2 98%   BMI 44.54 kg/m   General Medical Exam:   General:  Well appearing, comfortable   Neurological Exam: MENTAL STATUS including orientation to time, place, person, recent and remote memory, attention span and concentration, language, and fund of knowledge is normal.  Speech is not dysarthric.  CRANIAL NERVES: No visual field defects. Pupils equal round and reactive to light.  Normal conjugate, extra-ocular eye movements in all directions of gaze.  No ptosis.  Face is symmetric. Palate elevates symmetrically.  Tongue is midline.  MOTOR:  Motor strength is 5/5 in all extremities.  No pronator drift.  Tone is normal.  MSRs:  Reflexes are 2+/4 throughout.  SENSORY:  Intact to vibration.  COORDINATION/GAIT:  Normal finger to nose.  Gait is wide-based, unassisted.  Data: Records received from [facility] in [place]  MRI brain with and without contrast 10/26/2016: Extensive periventricular hyperintensity which demonstrate distribution and morphology consistent with multiple sclerosis. There is no evidence of enhancement to suggest acute demyelination.  MRI cervical spine wwo contrast 07/31/2015: Multilevel degenerative change in the cervical spine with mild spinal stenosis at C3-4, C4-5, C5-6. Left foraminal narrowing at C5-6 due to disc and osteophyte.  Spinal cord appears normal without cord lesion or abnormal enhancement. No cord compression.  MRI brain wwo contrast 07/24/2015: Extensive and premature for age white matter signal abnormality strongly suspicious for multiple sclerosis. Chronic microvascular ischemic changes are not excluded, but are less favored.  No restricted diffusion or abnormal  postcontrast enhancement at any location, but specifically in association with the white matter, to suggest an acute process.  Cervical spondylosis with suspected central disc protrusion/extrusion at C4-5 resulting in cord compression. This could contribute to numbness and pain in the extremities. Correlate clinically for radiculopathy or myelopathy. MRI cervical spine may be helpful in further evaluation as clinically indicated.  EMG of the upper extremities 05/26/2015: 1. Bilateral median neuropathy at or distal to the wrist, consistent with clinical diagnosis of carpal tunnel syndrome. Overall, these findings are mild in degree electrically and worse on the right. 2. There is no evidence of a cervical radiculopathy or diffuse myopathy affecting upper extremities.  Lab Results  Component Value Date   TSH 2.018 01/27/2014   Labs 09/29/2014: RF 12, ANA neg, ESR 23  CSF testing 08/14/2015:  R34 W0 P37 G67, IgG index 2.8, MPB < 2.0, OCB  >5 well defined gamma restriction bands that are also present in the patient's corresponding serum sample, but some bands in the CSF are more prominent   IMPRESSION/PLAN: 1.  Probable relapsing remitting multiple sclerosis based on white matter changes on MRI brain and increased oligoclonal bands in the CSF.  IgG index was normal.  She did not have any improvement with 3-days of solumedrol suggesting that her pain is not related to MS. Patient was able to get financial clearance for Tecifdera.  We reviewed her MRI brain from February 2018 which shows findings consistent with MS, no active lesions.  She is due for annual surveillance imaging.  Clinically, stable no interval relapses.   - MRI brain wwo contrast  - Continue Tecfidera 229m twice daily.  - Continue vitamin D 4000 units  - Check CBC and CMP today and every 3 months  2.  Multilevel cervical spinal stenosis without myelopathy, followed by orthopaedics and received ESI.  3.  Bilateral carpal tunnel  syndrome (mild), now compliant with wrist splints.   4.  Tobacco abuse, she is trying to quit and is down to 1 cigarette per day!  She was reminded of the dangers of tobacco abuse including stroke, cancer, and MI, as well as benefits of tobacco cessation. Approximately 5 mins were spent counseling patient cessation techniques and encouragement.  She will be starting to use nicotene patch today. I will reassess her progress at the next follow-up visit.  Return to clinic in 6 months   Thank you for allowing me to participate in patient's care.  If I can answer any additional questions, I would be pleased to do so.    Sincerely,    Tiffany Dory K. PPosey Pronto DO

## 2017-09-25 ENCOUNTER — Telehealth: Payer: Self-pay | Admitting: *Deleted

## 2017-09-25 NOTE — Telephone Encounter (Signed)
-----   Message from [name], DO sent at 09/22/2017  5:49 PM EST ----- Please inform patient that labs are stable.  Recheck in 3 months.

## 2017-09-25 NOTE — Telephone Encounter (Signed)
Attempted to contact patient.  No answer and no voicemail. 

## 2017-09-26 ENCOUNTER — Other Ambulatory Visit: Payer: Self-pay | Admitting: *Deleted

## 2017-09-26 ENCOUNTER — Telehealth: Payer: Self-pay | Admitting: *Deleted

## 2017-09-26 NOTE — Telephone Encounter (Signed)
Patient given results

## 2017-09-26 NOTE — Telephone Encounter (Signed)
-----   Message from [name], DO sent at 09/22/2017  5:49 PM EST ----- Please inform patient that labs are stable.  Recheck in 3 months.

## 2017-09-27 ENCOUNTER — Other Ambulatory Visit: Payer: Self-pay | Admitting: *Deleted

## 2017-09-27 DIAGNOSIS — Z79899 Other long term (current) drug therapy: Secondary | ICD-10-CM

## 2017-11-20 ENCOUNTER — Other Ambulatory Visit: Payer: Self-pay | Admitting: Internal Medicine

## 2017-11-20 DIAGNOSIS — F17200 Nicotine dependence, unspecified, uncomplicated: Secondary | ICD-10-CM

## 2017-11-24 ENCOUNTER — Other Ambulatory Visit: Payer: Self-pay | Admitting: Internal Medicine

## 2017-12-05 ENCOUNTER — Telehealth: Payer: Self-pay | Admitting: Neurology

## 2017-12-05 NOTE — Telephone Encounter (Signed)
Patient called needing to speak with you regarding her Leg and Hip pain. She is not sure what's causing it. Thanks

## 2017-12-06 NOTE — Telephone Encounter (Signed)
Called patient and informed her that we will put her on our urgent waiting list.

## 2017-12-08 ENCOUNTER — Telehealth: Payer: Self-pay | Admitting: Internal Medicine

## 2017-12-14 ENCOUNTER — Other Ambulatory Visit: Payer: Self-pay | Admitting: Internal Medicine

## 2017-12-14 DIAGNOSIS — F17200 Nicotine dependence, unspecified, uncomplicated: Secondary | ICD-10-CM

## 2017-12-23 ENCOUNTER — Other Ambulatory Visit: Payer: Self-pay | Admitting: Internal Medicine

## 2018-01-03 DIAGNOSIS — H903 Sensorineural hearing loss, bilateral: Secondary | ICD-10-CM | POA: Diagnosis not present

## 2018-01-03 DIAGNOSIS — G35 Multiple sclerosis: Secondary | ICD-10-CM | POA: Diagnosis not present

## 2018-01-03 DIAGNOSIS — H918X3 Other specified hearing loss, bilateral: Secondary | ICD-10-CM | POA: Diagnosis not present

## 2018-01-03 DIAGNOSIS — IMO0001 Reserved for inherently not codable concepts without codable children: Secondary | ICD-10-CM | POA: Insufficient documentation

## 2018-01-03 DIAGNOSIS — H9191 Unspecified hearing loss, right ear: Secondary | ICD-10-CM | POA: Diagnosis not present

## 2018-01-11 ENCOUNTER — Other Ambulatory Visit: Payer: Self-pay | Admitting: Internal Medicine

## 2018-01-16 ENCOUNTER — Telehealth: Payer: Self-pay | Admitting: Internal Medicine

## 2018-01-16 MED ORDER — OXYBUTYNIN CHLORIDE 5 MG PO TABS: 5.0000 mg | ORAL_TABLET | Freq: Two times a day (BID) | ORAL | 3 refills | Status: DC

## 2018-01-16 NOTE — Telephone Encounter (Signed)
-----   Message from Frederich Cha, CPhT sent at 01/15/2018  4:19 PM EDT ----- Regarding: Prior Auth. I received a PA for Ms. Jimmye Norman for [employer], her insurance prefers oxybutynin,  Myrbetriq or Toviaz. She needs to have tried and failed at least one of their preferred options in order for them to consider a PA.

## 2018-01-16 NOTE — Telephone Encounter (Signed)
Contacted and left a detailed vm informing pt about rx([facility])and Dr. Wynetta Emery response and if she has questions or concerns to give me a call

## 2018-01-18 ENCOUNTER — Ambulatory Visit: Payer: Medicare Other | Attending: Internal Medicine | Admitting: Internal Medicine

## 2018-01-18 ENCOUNTER — Encounter: Payer: Self-pay | Admitting: Internal Medicine

## 2018-01-18 VITALS — BP 144/93 | HR 74 | Temp 97.7°F | Resp 16 | Wt 255.6 lb

## 2018-01-18 DIAGNOSIS — Z820 Family history of epilepsy and other diseases of the nervous system: Secondary | ICD-10-CM | POA: Diagnosis not present

## 2018-01-18 DIAGNOSIS — E1122 Type 2 diabetes mellitus with diabetic chronic kidney disease: Secondary | ICD-10-CM | POA: Insufficient documentation

## 2018-01-18 DIAGNOSIS — R1032 Left lower quadrant pain: Secondary | ICD-10-CM

## 2018-01-18 DIAGNOSIS — G894 Chronic pain syndrome: Secondary | ICD-10-CM | POA: Diagnosis not present

## 2018-01-18 DIAGNOSIS — I129 Hypertensive chronic kidney disease with stage 1 through stage 4 chronic kidney disease, or unspecified chronic kidney disease: Secondary | ICD-10-CM | POA: Insufficient documentation

## 2018-01-18 DIAGNOSIS — M797 Fibromyalgia: Secondary | ICD-10-CM | POA: Diagnosis not present

## 2018-01-18 DIAGNOSIS — E119 Type 2 diabetes mellitus without complications: Secondary | ICD-10-CM

## 2018-01-18 DIAGNOSIS — Z79899 Other long term (current) drug therapy: Secondary | ICD-10-CM | POA: Diagnosis not present

## 2018-01-18 DIAGNOSIS — Z7982 Long term (current) use of aspirin: Secondary | ICD-10-CM | POA: Diagnosis not present

## 2018-01-18 DIAGNOSIS — Z91018 Allergy to other foods: Secondary | ICD-10-CM | POA: Insufficient documentation

## 2018-01-18 DIAGNOSIS — F17210 Nicotine dependence, cigarettes, uncomplicated: Secondary | ICD-10-CM | POA: Insufficient documentation

## 2018-01-18 DIAGNOSIS — N183 Chronic kidney disease, stage 3 (moderate): Secondary | ICD-10-CM | POA: Diagnosis not present

## 2018-01-18 DIAGNOSIS — R42 Dizziness and giddiness: Secondary | ICD-10-CM | POA: Diagnosis not present

## 2018-01-18 DIAGNOSIS — I10 Essential (primary) hypertension: Secondary | ICD-10-CM | POA: Diagnosis not present

## 2018-01-18 DIAGNOSIS — E669 Obesity, unspecified: Secondary | ICD-10-CM | POA: Diagnosis not present

## 2018-01-18 DIAGNOSIS — G35 Multiple sclerosis: Secondary | ICD-10-CM | POA: Diagnosis not present

## 2018-01-18 DIAGNOSIS — E559 Vitamin D deficiency, unspecified: Secondary | ICD-10-CM | POA: Diagnosis not present

## 2018-01-18 DIAGNOSIS — J301 Allergic rhinitis due to pollen: Secondary | ICD-10-CM | POA: Diagnosis not present

## 2018-01-18 DIAGNOSIS — Z8249 Family history of ischemic heart disease and other diseases of the circulatory system: Secondary | ICD-10-CM | POA: Insufficient documentation

## 2018-01-18 DIAGNOSIS — Z6841 Body Mass Index (BMI) 40.0 and over, adult: Secondary | ICD-10-CM | POA: Diagnosis not present

## 2018-01-18 LAB — GLUCOSE, POCT (MANUAL RESULT ENTRY): POC Glucose: 112 mg/dl — AB (ref 70–99)

## 2018-01-18 LAB — POCT GLYCOSYLATED HEMOGLOBIN (HGB A1C): HEMOGLOBIN A1C: 6

## 2018-01-18 MED ORDER — FLUTICASONE PROPIONATE 50 MCG/ACT NA SUSP: 1.0000 | Freq: Every day | NASAL | 1 refills | Status: DC | PRN

## 2018-01-18 NOTE — Progress Notes (Signed)
Patient ID: Tiffany Brady, female    DOB: Aug 03, 1961  MRN: [id number]  CC: Diabetes and Hypertension   Subjective: Tiffany Brady is a 57 y.o. female who presents for chronic ds management.  Her concerns today include:  Pt with hx of DM type 2, HTN, Tob, obesity, fibromyalgia, CKD stage 3 and MS.  C/o intermittent pain LT upper quadrant abdome this wk. No relation to food.  No N/V/D or constipation.  No dysuria  MS:  Intermittent dizziness. No relation to position changes.  Can last 3-5 mins. No dec hearing or ringing in the ears.  Saw ENT recently and had hearing eval.  Has dec hearing on RT but not to point of needing aid Sees neurologist tomorrow Taking Vit D 2000 IU Has not pick up rxn as yet for Ditropan  HTN: compliant with med but did not take as yet for the morning No CP/SOB  DM: out of stripes, so has not checked BS recently -appetite goes and comes "but I can eat something sweet."  Loves apple pies, cup cake.   Allergy symptoms over past 2 wks to include sneezing, rhinorrhea and itchy eyes.  Patient Active Problem List   Diagnosis Date Noted  . Stress incontinence 05/08/2017  . Cervical radiculopathy 05/08/2017  . CKD (chronic kidney disease) stage 3, GFR 30-59 ml/min (HCC) 04/04/2017  . Vitamin D deficiency 04/04/2017  . Controlled type 2 diabetes mellitus without complication, without long-term current use of insulin ([place]) 04/04/2017  . Fibromyalgia 03/28/2016  . Multiple sclerosis ([place]) 11/02/2015  . Tobacco abuse 04/24/2014  . Back muscle spasm 01/28/2014  . Migraine headache 07/23/2013  . Essential hypertension 06/27/2013  . Chronic pain syndrome 06/27/2013  . Obesity, unspecified 06/27/2013     Current Outpatient Medications on File Prior to Visit  Medication Sig Dispense Refill  . aspirin EC 81 MG tablet Take 1 tablet (81 mg total) by mouth daily. 100 tablet 1  . Blood Glucose Monitoring Suppl (TRUE METRIX GO GLUCOSE METER) w/Device KIT 1 each by  Does not apply route every 8 (eight) hours as needed. 1 kit 0  . cyclobenzaprine (FLEXERIL) 10 MG tablet TAKE 1 TABLET BY MOUTH TWICE A DAY AS NEEDED FOR MUSCLE SPASMS 60 tablet 0  . diclofenac sodium (VOLTAREN) 1 % GEL Apply 2 g topically 4 (four) times daily. 100 g 2  . Dimethyl Fumarate (TECFIDERA) 240 MG CPDR Take 240 mg by mouth 2 (two) times daily.    . DULoxetine (CYMBALTA) 30 MG capsule TAKE 1 CAPSULE BY MOUTH DAILY 30 capsule 5  . glucose blood (TRUE METRIX BLOOD GLUCOSE TEST) test strip Use as instructed 100 each 12  . losartan-hydrochlorothiazide (HYZAAR) 100-25 MG tablet Take 0.5 tablets by mouth daily. 30 tablet 6  . nicotine (NICODERM CQ - DOSED IN MG/24 HOURS) 14 mg/24hr patch PLACE 1 PATCH (14 MG TOTAL) ONTO THE SKIN DAILY. 30 patch 1  . oxybutynin (DITROPAN) 5 MG tablet Take 1 tablet (5 mg total) by mouth 2 (two) times daily. 60 tablet 3  . TRUEPLUS LANCETS 26G MISC 1 each by Does not apply route every 8 (eight) hours as needed. 100 each 12   No current facility-administered medications on file prior to visit.     Allergies  Allergen Reactions  . Pork-Derived Metallurgist    Social History   Socioeconomic History  . Marital status: Widowed    Spouse name: Not on file  . Number of children: Not on file  . Years  of education: Not on file  . Highest education level: Not on file  Occupational History  . Not on file  Social Needs  . Financial resource strain: Not on file  . Food insecurity:    Worry: Not on file    Inability: Not on file  . Transportation needs:    Medical: Not on file    Non-medical: Not on file  Tobacco Use  . Smoking status: Current Every Day Smoker    Packs/day: 0.75    Years: 40.00    Pack years: 30.00    Types: Cigarettes  . Smokeless tobacco: Never Used  Substance and Sexual Activity  . Alcohol use: Yes    Alcohol/week: 0.0 oz    Comment: occasional  . Drug use: No  . Sexual activity: Not on file  Lifestyle  . Physical activity:     Days per week: Not on file    Minutes per session: Not on file  . Stress: Not on file  Relationships  . Social connections:    Talks on phone: Not on file    Gets together: Not on file    Attends religious service: Not on file    Active member of club or organization: Not on file    Attends meetings of clubs or organizations: Not on file    Relationship status: Not on file  . Intimate partner violence:    Fear of current or ex partner: Not on file    Emotionally abused: Not on file    Physically abused: Not on file    Forced sexual activity: Not on file  Other Topics Concern  . Not on file  Social History Narrative   Lives with niece and her 3 children in a 2 story home.     Only goes upstairs once a day.  Has 1 child.  Does not work.  Trying to get disability.  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     Education: 10th grade.    Family History  Problem Relation Age of Onset  . Cancer Mother        Deceased  . Hypertension Mother   . Other Father        Deceased, 98  . HIV/AIDS Brother        Deceased  . Multiple sclerosis Daughter     Past Surgical History:  Procedure Laterality Date  . FOOT SURGERY Right     ROS: Review of Systems Except as stated above PHYSICAL EXAM: BP (!) 144/93   Pulse 74   Temp 97.7 F (36.5 C) (Oral)   Resp 16   Wt 255 lb 9.6 oz (115.9 kg)   LMP 04/21/2014   SpO2 97%   BMI 43.87 kg/m   Wt Readings from Last 3 Encounters:  01/18/18 255 lb 9.6 oz (115.9 kg)  09/22/17 259 lb 8 oz (117.7 kg)  09/19/17 256 lb 6.4 oz (116.3 kg)  BP siting and standing 140/90  Physical Exam  General appearance - alert, well appearing, and in no distress Mental status - alert, oriented to person, place, and time, normal mood, behavior, speech, dress, motor activity, and thought processes Neck - supple, no significant adenopathy Chest - clear to auscultation, no wheezes, rales or rhonchi, symmetric air entry Heart - normal rate,  regular rhythm, normal S1, S2, no murmurs, rubs, clicks or gallops Abdomen: Normal bowel sounds.  Soft nontender no organomegaly Extremities - peripheral pulses normal, no pedal edema, no clubbing or  cyanosis MSK: Ambulates with a cane Results for orders placed or performed in visit on 01/18/18  POCT glucose (manual entry)  Result Value Ref Range   POC Glucose 112 (A) 70 - 99 mg/dl  POCT glycosylated hemoglobin (Hb A1C)  Result Value Ref Range   Hemoglobin A1C 6.0    A1C 6.0/BS 112  ASSESSMENT AND PLAN: 1. Controlled type 2 diabetes mellitus without complication, without long-term current use of insulin ([place]) At goal.  Encourage her to chose healthy snacks - POCT glucose (manual entry) - POCT glycosylated hemoglobin (Hb A1C)  2. Essential hypertension Not at goal.  Given rxn to get BP device to check BP at home.  In particular I have requested that she check her blood pressure whenever she feels dizzy to see if she is running low - DME Other see comment - CBC - Comprehensive metabolic panel  3. Dizziness I suspect this may be related to MS. orthostatic blood pressure check today was negative.  Will check CBC and other blood work today  4. Seasonal allergic rhinitis due to pollen -Recommend over-the-counter Claritin - fluticasone (FLONASE) 50 MCG/ACT nasal spray; Place 1 spray into both nostrils daily as needed for allergies or rhinitis.  Dispense: 16 g; Refill: 1  5. Multiple sclerosis [facility]) Appointment with neurology  6. Left lower quadrant pain Will observe for now.  If she has any recurrent episodes she will follow-up  Patient was given the opportunity to ask questions.  Patient verbalized understanding of the plan and was able to repeat key elements of the plan.   Orders Placed This Encounter  Procedures  . DME Other see comment  . CBC  . Comprehensive metabolic panel  . POCT glucose (manual entry)  . POCT glycosylated hemoglobin (Hb A1C)     Requested  Prescriptions   Signed Prescriptions Disp Refills  . fluticasone (FLONASE) 50 MCG/ACT nasal spray 16 g 1    Sig: Place 1 spray into both nostrils daily as needed for allergies or rhinitis.    Return in about 3 months (around 04/20/2018).  Karle Plumber, MD, FACP

## 2018-01-18 NOTE — Patient Instructions (Signed)
Purchase and use Claritin over the counter as needed for allergy symptoms.

## 2018-01-19 ENCOUNTER — Ambulatory Visit (INDEPENDENT_AMBULATORY_CARE_PROVIDER_SITE_OTHER): Payer: Medicare Other | Admitting: Neurology

## 2018-01-19 ENCOUNTER — Telehealth: Payer: Self-pay

## 2018-01-19 ENCOUNTER — Encounter: Payer: Self-pay | Admitting: Neurology

## 2018-01-19 VITALS — BP 120/80 | HR 97 | Ht 64.0 in | Wt 255.2 lb

## 2018-01-19 DIAGNOSIS — G35 Multiple sclerosis: Secondary | ICD-10-CM

## 2018-01-19 DIAGNOSIS — R42 Dizziness and giddiness: Secondary | ICD-10-CM

## 2018-01-19 LAB — CBC
Hematocrit: 45.2 % (ref 34.0–46.6)
Hemoglobin: 15.1 g/dL (ref 11.1–15.9)
MCH: 30.6 pg (ref 26.6–33.0)
MCHC: 33.4 g/dL (ref 31.5–35.7)
MCV: 92 fL (ref 79–97)
PLATELETS: 214 10*3/uL (ref 150–379)
RBC: 4.93 x10E6/uL (ref 3.77–5.28)
RDW: 14 % (ref 12.3–15.4)
WBC: 7 10*3/uL (ref 3.4–10.8)

## 2018-01-19 LAB — COMPREHENSIVE METABOLIC PANEL
ALT: 14 IU/L (ref 0–32)
AST: 17 IU/L (ref 0–40)
Albumin/Globulin Ratio: 1.3 (ref 1.2–2.2)
Albumin: 4 g/dL (ref 3.5–5.5)
Alkaline Phosphatase: 74 IU/L (ref 39–117)
BUN/Creatinine Ratio: 9 (ref 9–23)
BUN: 15 mg/dL (ref 6–24)
Bilirubin Total: 0.4 mg/dL (ref 0.0–1.2)
CALCIUM: 9.7 mg/dL (ref 8.7–10.2)
CO2: 24 mmol/L (ref 20–29)
CREATININE: 1.69 mg/dL — AB (ref 0.57–1.00)
Chloride: 102 mmol/L (ref 96–106)
GFR calc Af Amer: 38 mL/min/{1.73_m2} — ABNORMAL LOW (ref >59)
GFR, EST NON AFRICAN AMERICAN: 33 mL/min/{1.73_m2} — AB (ref >59)
Globulin, Total: 3 g/dL (ref 1.5–4.5)
Glucose: 102 mg/dL — ABNORMAL HIGH (ref 65–99)
Potassium: 4.1 mmol/L (ref 3.5–5.2)
Sodium: 140 mmol/L (ref 134–144)
Total Protein: 7 g/dL (ref 6.0–8.5)

## 2018-01-19 MED ORDER — MECLIZINE HCL 12.5 MG PO TABS: 12.5000 mg | ORAL_TABLET | Freq: Three times a day (TID) | ORAL | 3 refills | Status: DC | PRN

## 2018-01-19 NOTE — Progress Notes (Signed)
Follow-up Visit   Date: 01/19/18   Tiffany Brady MRN: [id number] DOB: [birth year]   Interim History: Tiffany Brady is a 57 y.o. right-handed African American female with chronic pain syndrome, fibromyaglia, hypertension, and tobacco use returning to the clinic for follow-up of multiple sclerosis.  The patient was accompanied to the clinic by self.  History of present illness: Starting in July 2016, she began experiencing numbness/tingling over the tips of her fingers on both hands which has steadily involved the whole hand. She also has numbness over the right leg above the knees, but this is intermittent. She endorses pain related weakness and has difficulty with walking and doing daily activities such as bathing and cooking because of the pain.   She also has a lot of whole body pain described as soreness which is tender to palpation. She feels as if someone cut her and bleeding on the inside or as if someone is breaking her collar bone.  She was recently started on Cymbalta helps alleviate the pain where she can function, but it is still always there. Her ANA, RF, and TSH is normal. In October 2016, presented to her PCP's office with generalized weakness, gait difficulty, bowel and bladder incontinenced and concerned about MS due to her daughter diagnosed with this.  She underwent MRI brain which showed white matter changes involving the periventricular region and subcortical white matter, concerning for MS.  Although initial imaging also suggested cervical cord compression, subsequent imaging of her cervical spine only showed multilevel spinal stenosis without cord involvement.  She underwent CSF testing which showed normal IgG index, but there was increased oligoclonal bands both in the CSF and serum, but moreso apparent in the CSF.   She started gabapentin 340m BID but had not noticed any changes in her left arm.  She is not using wrist splints due to expense.   She complains  episodic left sided weakness and numbness occuring several times per day, lasting a few minutes. She reports having urge incontinence for the past 15 year, which is triggered by sneezing, coughing, or laughing. She does not have a regular OBGYN provider. She complains of gait difficulty but denies walks and walks unassisted.  She has chronic pain involving her whole body.  In early 2017, she was treated with 3-days of IVMP, and did not notice any difference in any of her symptoms.  She continues to complain of a lot of myalgias and generalized pain throughout (knees, hips, elbows).  She does not see pain management and PCP manages her fibromyalgia.    UPDATE 04/19/2017:   She stopped all her medications about a month ago because of being stressed with her medications.  Around 2 months ago, she started having memory changes, speech difficulty described as stuttering and word-finding difficulty.  During the same time, [place] was extremely stressed [place], whom she [place].  [place] who ordered MRI brain, but she did not follow-up and is unsure of the results or the providers name.  [place], which has improved her state of mind and is now interested in restarting her medications.  She continues to have neck pain and whole-body pain due to fibromyalgia.   UPDATE 09/22/2017:  She is here for follow-up visit.  A few weeks ago, she developed dizziness and  stopped several medications, including Tecfidera and later found that it was another medication, so is planning on restarting it today.  She is also trying to quit smoking and is down to 1 cigarette/day!  She had recent eye exam and needs new glasses due to change in visual acuity, but has not been able to afford this yet.    UPDATE 01/19/2018:  She is here for follow-up  visit.  MRI brain was ordered at her last visit in January, but she did not follow through with this because she was not contacted with an appointment.  She continues to have generalized of the arm and legs from fibromyalgia.  She still has dizzy spells which lasts about 1 minute and improved by resting.  She does not have spinning sensation, nausea, or vomiting. She saw ENT who did not find inner ear pathology.  She also complains of bilateral hip and heel pain.   Medications:  Current Outpatient Medications on File Prior to Visit  Medication Sig Dispense Refill  . aspirin EC 81 MG tablet Take 1 tablet (81 mg total) by mouth daily. 100 tablet 1  . Blood Glucose Monitoring Suppl (TRUE METRIX GO GLUCOSE METER) w/Device KIT 1 each by Does not apply route every 8 (eight) hours as needed. 1 kit 0  . cyclobenzaprine (FLEXERIL) 10 MG tablet TAKE 1 TABLET BY MOUTH TWICE A DAY AS NEEDED FOR MUSCLE SPASMS 60 tablet 0  . diclofenac sodium (VOLTAREN) 1 % GEL Apply 2 g topically 4 (four) times daily. 100 g 2  . Dimethyl Fumarate (TECFIDERA) 240 MG CPDR Take 240 mg by mouth 2 (two) times daily.    . DULoxetine (CYMBALTA) 30 MG capsule TAKE 1 CAPSULE BY MOUTH DAILY 30 capsule 5  . fluticasone (FLONASE) 50 MCG/ACT nasal spray Place 1 spray into both nostrils daily as needed for allergies or rhinitis. 16 g 1  . glucose blood (TRUE METRIX BLOOD GLUCOSE TEST) test strip Use as instructed 100 each 12  . losartan-hydrochlorothiazide (HYZAAR) 100-25 MG tablet Take 0.5 tablets by mouth daily. 30 tablet 6  . nicotine (NICODERM CQ - DOSED IN MG/24 HOURS) 14 mg/24hr patch PLACE 1 PATCH (14 MG TOTAL) ONTO THE SKIN DAILY. 30 patch 1  . oxybutynin (DITROPAN) 5 MG tablet Take 1 tablet (5 mg total) by mouth 2 (two) times daily. 60 tablet 3  . TRUEPLUS LANCETS 26G MISC 1 each by Does not apply route every 8 (eight) hours as needed. 100 each 12   No current facility-administered medications on file prior to visit.      Allergies:  Allergies  Allergen Reactions  . Pork-Derived Products Hives    Review of Systems:  CONSTITUTIONAL: No fevers, chills, night sweats, or weight loss.  EYES: No visual changes or eye pain ENT: No hearing changes.  No history of nose bleeds.   RESPIRATORY: No cough, wheezing and shortness of breath.   CARDIOVASCULAR: Negative for chest pain, and palpitations.   GI: Negative for abdominal discomfort, blood in stools or black stools.  No recent change in bowel habits.   GU:  No history of incontinence.   MUSCLOSKELETAL: +history of joint pain or swelling.  +myalgias.   SKIN: Negative for lesions, rash, and itching.   ENDOCRINE: Negative for cold or heat intolerance, polydipsia or goiter.   PSYCH:  No depression or anxiety symptoms.   NEURO: As Above.   Vital Signs:  BP 120/80   Pulse 97   Ht _0  (1.626 m)  Wt 255 lb 4 oz (115.8 kg)   LMP 04/21/2014   SpO2 96%   BMI 43.81 kg/m   General Medical Exam:   General:  Well appearing, comfortable  Neck:  No carotid bruits. Respiratory:  Clear to auscultation, good air entry bilaterally.   Cardiac:  Regular rate and rhythm, no murmur.   Ext:  Tenderness over the hips and legs  Neurological Exam: MENTAL STATUS including orientation to time, place, person, recent and remote memory, attention span and concentration, language, and fund of knowledge is normal.  Speech is not dysarthric.  CRANIAL NERVES: No visual field defects. Pupils equal round and reactive to light.  Normal conjugate, extra-ocular eye movements in all directions of gaze. There is fatigable nystagmus to the right.  No ptosis.  Face is symmetric. Palate elevates symmetrically.  Tongue is midline.  MOTOR:  Motor strength is 5/5 in all extremities.  No pronator drift.  Tone is normal.    MSRs:  Reflexes are 2+/4 throughout.  SENSORY:  Intact to vibration.  COORDINATION/GAIT:  Normal finger to nose.  Gait is wide-based, unassisted, antalgic due to  heel pain  Data: Records received from [facility] in [place]  MRI brain with and without contrast 10/26/2016: Extensive periventricular hyperintensity which demonstrate distribution and morphology consistent with multiple sclerosis. There is no evidence of enhancement to suggest acute demyelination.  MRI cervical spine wwo contrast 07/31/2015: Multilevel degenerative change in the cervical spine with mild spinal stenosis at C3-4, C4-5, C5-6. Left foraminal narrowing at C5-6 due to disc and osteophyte.  Spinal cord appears normal without cord lesion or abnormal enhancement. No cord compression.  MRI brain wwo contrast 07/24/2015: Extensive and premature for age white matter signal abnormality strongly suspicious for multiple sclerosis. Chronic microvascular ischemic changes are not excluded, but are less favored.  No restricted diffusion or abnormal postcontrast enhancement at any location, but specifically in association with the white matter, to suggest an acute process.  Cervical spondylosis with suspected central disc protrusion/extrusion at C4-5 resulting in cord compression. This could contribute to numbness and pain in the extremities. Correlate clinically for radiculopathy or myelopathy. MRI cervical spine may be helpful in further evaluation as clinically indicated.  EMG of the upper extremities 05/26/2015: 1. Bilateral median neuropathy at or distal to the wrist, consistent with clinical diagnosis of carpal tunnel syndrome. Overall, these findings are mild in degree electrically and worse on the right. 2. There is no evidence of a cervical radiculopathy or diffuse myopathy affecting upper extremities.  Lab Results  Component Value Date   TSH 2.018 01/27/2014   Labs 09/29/2014: RF 12, ANA neg, ESR 23  CSF testing 08/14/2015:  R34 W0 P37 G67, IgG index 2.8, MPB < 2.0, OCB  >5 well defined gamma restriction bands that are also present in the patient's  corresponding serum sample, but some bands in the CSF are more prominent   IMPRESSION/PLAN: 1.  Relapsing remitting multiple sclerosis based on white matter changes on MRI brain and increased oligoclonal bands in the CSF.  IgG index was normal.  She did not have any improvement with 3-days of solumedrol suggesting that her pain is not related to MS. She was started on Tecfidera and getting financial assistance.  MRI brain from February 2018 shows findings consistent with MS, no active lesions.  She is due for annual surveillance imaging.   - MRI brain wwo contrast  - Continue Tecfidera 239m twice daily.  - Continue vitamin D 4000 units  -  Check CBC and CMP today and every 3 months. Yesterday's labs reviewed and stable.   2.  Dizziness.  Need imaging to determine if any new lesions in the cerebellum could be contributing to this.  Start meclizine 12.14m TID as needed for symptom management.   3.  Generalized myalgias due to fibromyalgia, followed by PCP  Return to clinic in 4 months   Thank you for allowing me to participate in patient's care.  If I can answer any additional questions, I would be pleased to do so.    Sincerely,    Bettey Muraoka K. PPosey Pronto DO

## 2018-01-19 NOTE — Patient Instructions (Signed)
Start meclizine 12.5mg  as needed for dizziness three times daily  Continue Tecfidera 240mg  twice daily  MRI brain wwo contrast  Return to clinic in 4 months

## 2018-01-19 NOTE — Telephone Encounter (Signed)
Contacted pt to go over lab results pt is aware and doesn't have any questions or concerns 

## 2018-01-24 ENCOUNTER — Telehealth: Payer: Self-pay | Admitting: Neurology

## 2018-01-24 ENCOUNTER — Other Ambulatory Visit: Payer: Self-pay | Admitting: *Deleted

## 2018-01-24 ENCOUNTER — Other Ambulatory Visit: Payer: Self-pay | Admitting: Internal Medicine

## 2018-01-24 DIAGNOSIS — Z720 Tobacco use: Secondary | ICD-10-CM

## 2018-01-24 MED ORDER — DIMETHYL FUMARATE 240 MG PO CPDR: 240.0000 mg | DELAYED_RELEASE_CAPSULE | Freq: Two times a day (BID) | ORAL | 3 refills | Status: DC

## 2018-01-24 NOTE — Telephone Encounter (Signed)
Patient has already been scheduled for 01-31-18.  I will re-order the tecfidera.

## 2018-01-24 NOTE — Telephone Encounter (Signed)
Pt called and said she has not been contacted yet to schedule her MRI, also she needs a refill on her [facility]? Pt does not know name of pharmacy and pt said she usually gets a year suppy

## 2018-01-27 ENCOUNTER — Other Ambulatory Visit: Payer: Self-pay | Admitting: Internal Medicine

## 2018-01-27 DIAGNOSIS — F17200 Nicotine dependence, unspecified, uncomplicated: Secondary | ICD-10-CM

## 2018-01-31 ENCOUNTER — Ambulatory Visit
Admission: RE | Admit: 2018-01-31 | Discharge: 2018-01-31 | Disposition: A | Discharge status: Home / Self Care | Payer: Medicare Other | Source: Ambulatory Visit | Attending: Neurology | Admitting: Neurology

## 2018-01-31 DIAGNOSIS — G35 Multiple sclerosis: Secondary | ICD-10-CM | POA: Diagnosis not present

## 2018-01-31 DIAGNOSIS — R42 Dizziness and giddiness: Secondary | ICD-10-CM

## 2018-01-31 MED ORDER — GADOBENATE DIMEGLUMINE 529 MG/ML IV SOLN
10.0000 mL | Freq: Once | INTRAVENOUS | Status: AC | PRN
  Administered 2018-01-31: 10 mL via INTRAVENOUS

## 2018-02-02 ENCOUNTER — Telehealth: Payer: Self-pay | Admitting: *Deleted

## 2018-02-02 NOTE — Telephone Encounter (Signed)
Attempted to contact patient.  No answer and no voicemail. 

## 2018-02-02 NOTE — Telephone Encounter (Signed)
-----   Message from [name], DO sent at 02/01/2018  5:17 PM EDT ----- Please inform patient that her MRI brain shows stable MS disease, no new lesions. When compared to her last MRI from 2016, there has been a small microhemorrhage (tiny bleed) in 2 areas; most likely due to hypertension.  Please instruct her to stay on her blood pressure medications and follow BP closely.

## 2018-02-07 ENCOUNTER — Telehealth: Payer: Self-pay | Admitting: *Deleted

## 2018-02-07 NOTE — Telephone Encounter (Signed)
-----   Message from [name], DO sent at 02/01/2018  5:17 PM EDT ----- Please inform patient that her MRI brain shows stable MS disease, no new lesions. When compared to her last MRI from 2016, there has been a small microhemorrhage (tiny bleed) in 2 areas; most likely due to hypertension.  Please instruct her to stay on her blood pressure medications and follow BP closely.

## 2018-02-07 NOTE — Telephone Encounter (Signed)
Patient given results and instructions.   

## 2018-02-19 ENCOUNTER — Other Ambulatory Visit: Payer: Self-pay | Admitting: Internal Medicine

## 2018-02-19 DIAGNOSIS — J301 Allergic rhinitis due to pollen: Secondary | ICD-10-CM

## 2018-02-22 ENCOUNTER — Other Ambulatory Visit: Payer: Self-pay | Admitting: Internal Medicine

## 2018-02-22 DIAGNOSIS — Z720 Tobacco use: Secondary | ICD-10-CM

## 2018-03-12 ENCOUNTER — Other Ambulatory Visit: Payer: Self-pay | Admitting: Internal Medicine

## 2018-03-12 DIAGNOSIS — Z720 Tobacco use: Secondary | ICD-10-CM

## 2018-03-26 ENCOUNTER — Encounter

## 2018-03-26 ENCOUNTER — Ambulatory Visit: Payer: Medicare Other | Admitting: Neurology

## 2018-03-28 ENCOUNTER — Other Ambulatory Visit: Payer: Self-pay

## 2018-03-28 DIAGNOSIS — F17200 Nicotine dependence, unspecified, uncomplicated: Secondary | ICD-10-CM

## 2018-03-28 MED ORDER — NICOTINE 14 MG/24HR TD PT24: 14.0000 mg | MEDICATED_PATCH | Freq: Every day | TRANSDERMAL | 1 refills | Status: DC

## 2018-03-29 ENCOUNTER — Other Ambulatory Visit: Payer: Self-pay | Admitting: Internal Medicine

## 2018-03-29 DIAGNOSIS — J301 Allergic rhinitis due to pollen: Secondary | ICD-10-CM

## 2018-04-19 ENCOUNTER — Encounter: Payer: Self-pay | Admitting: Internal Medicine

## 2018-04-19 DIAGNOSIS — E119 Type 2 diabetes mellitus without complications: Secondary | ICD-10-CM | POA: Diagnosis not present

## 2018-04-19 DIAGNOSIS — G35 Multiple sclerosis: Secondary | ICD-10-CM | POA: Diagnosis not present

## 2018-04-19 DIAGNOSIS — H2513 Age-related nuclear cataract, bilateral: Secondary | ICD-10-CM | POA: Diagnosis not present

## 2018-04-19 LAB — HM DIABETES EYE EXAM

## 2018-04-20 ENCOUNTER — Ambulatory Visit: Payer: Medicare Other | Attending: Internal Medicine | Admitting: Internal Medicine

## 2018-04-20 ENCOUNTER — Encounter: Payer: Self-pay | Admitting: Internal Medicine

## 2018-04-20 VITALS — BP 150/95 | HR 79 | Temp 97.9°F | Resp 16 | Wt 253.4 lb

## 2018-04-20 DIAGNOSIS — E669 Obesity, unspecified: Secondary | ICD-10-CM | POA: Diagnosis not present

## 2018-04-20 DIAGNOSIS — Z720 Tobacco use: Secondary | ICD-10-CM | POA: Diagnosis not present

## 2018-04-20 DIAGNOSIS — E559 Vitamin D deficiency, unspecified: Secondary | ICD-10-CM | POA: Insufficient documentation

## 2018-04-20 DIAGNOSIS — I129 Hypertensive chronic kidney disease with stage 1 through stage 4 chronic kidney disease, or unspecified chronic kidney disease: Secondary | ICD-10-CM | POA: Insufficient documentation

## 2018-04-20 DIAGNOSIS — E1122 Type 2 diabetes mellitus with diabetic chronic kidney disease: Secondary | ICD-10-CM | POA: Diagnosis not present

## 2018-04-20 DIAGNOSIS — G894 Chronic pain syndrome: Secondary | ICD-10-CM | POA: Insufficient documentation

## 2018-04-20 DIAGNOSIS — Z8249 Family history of ischemic heart disease and other diseases of the circulatory system: Secondary | ICD-10-CM | POA: Insufficient documentation

## 2018-04-20 DIAGNOSIS — R221 Localized swelling, mass and lump, neck: Secondary | ICD-10-CM | POA: Diagnosis not present

## 2018-04-20 DIAGNOSIS — N183 Chronic kidney disease, stage 3 (moderate): Secondary | ICD-10-CM | POA: Diagnosis not present

## 2018-04-20 DIAGNOSIS — Z6841 Body Mass Index (BMI) 40.0 and over, adult: Secondary | ICD-10-CM | POA: Insufficient documentation

## 2018-04-20 DIAGNOSIS — M79604 Pain in right leg: Secondary | ICD-10-CM | POA: Diagnosis not present

## 2018-04-20 DIAGNOSIS — F17210 Nicotine dependence, cigarettes, uncomplicated: Secondary | ICD-10-CM | POA: Insufficient documentation

## 2018-04-20 DIAGNOSIS — Z79899 Other long term (current) drug therapy: Secondary | ICD-10-CM | POA: Diagnosis not present

## 2018-04-20 DIAGNOSIS — M79605 Pain in left leg: Secondary | ICD-10-CM | POA: Insufficient documentation

## 2018-04-20 DIAGNOSIS — M654 Radial styloid tenosynovitis [de Quervain]: Secondary | ICD-10-CM | POA: Insufficient documentation

## 2018-04-20 DIAGNOSIS — G35 Multiple sclerosis: Secondary | ICD-10-CM | POA: Diagnosis not present

## 2018-04-20 DIAGNOSIS — M79671 Pain in right foot: Secondary | ICD-10-CM | POA: Diagnosis not present

## 2018-04-20 DIAGNOSIS — I10 Essential (primary) hypertension: Secondary | ICD-10-CM | POA: Diagnosis not present

## 2018-04-20 DIAGNOSIS — Z91018 Allergy to other foods: Secondary | ICD-10-CM | POA: Diagnosis not present

## 2018-04-20 DIAGNOSIS — Z820 Family history of epilepsy and other diseases of the nervous system: Secondary | ICD-10-CM | POA: Insufficient documentation

## 2018-04-20 DIAGNOSIS — Z7982 Long term (current) use of aspirin: Secondary | ICD-10-CM | POA: Insufficient documentation

## 2018-04-20 DIAGNOSIS — M797 Fibromyalgia: Secondary | ICD-10-CM | POA: Insufficient documentation

## 2018-04-20 DIAGNOSIS — E119 Type 2 diabetes mellitus without complications: Secondary | ICD-10-CM | POA: Diagnosis not present

## 2018-04-20 LAB — POCT GLYCOSYLATED HEMOGLOBIN (HGB A1C): HBA1C, POC (PREDIABETIC RANGE): 5.8 % (ref 5.7–6.4)

## 2018-04-20 LAB — GLUCOSE, POCT (MANUAL RESULT ENTRY): POC Glucose: 137 mg/dl — AB (ref 70–99)

## 2018-04-20 MED ORDER — GABAPENTIN 300 MG PO CAPS: 300.0000 mg | ORAL_CAPSULE | Freq: Every day | ORAL | 3 refills | Status: DC

## 2018-04-20 MED ORDER — DICLOFENAC SODIUM 1 % TD GEL: 2.0000 g | Freq: Four times a day (QID) | TRANSDERMAL | 2 refills | Status: DC

## 2018-04-20 NOTE — Patient Instructions (Signed)
Start gabapentin 300 mg at bedtime. Use the Voltaren gel on the wrists. Please go to [facility] to get an x-ray of the right heel.

## 2018-04-20 NOTE — Progress Notes (Signed)
Patient ID: Tiffany Brady, female    DOB: Apr 22, 1961  MRN: [id number]  CC: Diabetes and Hypertension   Subjective: Tiffany Brady is a 57 y.o. female who presents for chronic ds management Her concerns today include:  Pt with hx of DM type 2, HTN, Tob, obesity, fibromyalgia, CKD stage 3 and MS.  C/o intermittent pain and swelling LT side of neck above clavicle for yrs -It is swollen and a little sore today.  No dysphasia.  Pain in LT from and along the radial wrist x 3 wks. thumb gets stuck and has to pop it back in place.  Also starting to have the same problem on the right side.    Pain in RT posterior  heel x 2 mths.  She points to the area of insertion of the Achilles tendon onto the calcaneal bone.  Little swelling.  No injury.   Pain in legs at nights x 2 wks.  Sometimes cramps and sometimes sharp.  Moving the legs make it worse.  Bettter with rubbing the legs  HTN:  Did not take as yet.  Reports compliance with Cozaar/HCTZ  Tob dep: 1 pk last 4 days.  Wants to quit.  Has the patches but not using them consistently  She has seen her neurologist since last visit with me.  She had MRI of the head to evaluate for any new lesions in the cerebellum given her complaint at that time of dizziness.  MRI findings were stable. Patient Active Problem List   Diagnosis Date Noted  . Stress incontinence 05/08/2017  . Cervical radiculopathy 05/08/2017  . CKD (chronic kidney disease) stage 3, GFR 30-59 ml/min (HCC) 04/04/2017  . Vitamin D deficiency 04/04/2017  . Controlled type 2 diabetes mellitus without complication, without long-term current use of insulin ([place]) 04/04/2017  . Fibromyalgia 03/28/2016  . Multiple sclerosis ([place]) 11/02/2015  . Tobacco abuse 04/24/2014  . Back muscle spasm 01/28/2014  . Migraine headache 07/23/2013  . Essential hypertension 06/27/2013  . Chronic pain syndrome 06/27/2013  . Obesity, unspecified 06/27/2013     Current Outpatient Medications on  File Prior to Visit  Medication Sig Dispense Refill  . aspirin EC 81 MG tablet Take 1 tablet (81 mg total) by mouth daily. 100 tablet 1  . Blood Glucose Monitoring Suppl (TRUE METRIX GO GLUCOSE METER) w/Device KIT 1 each by Does not apply route every 8 (eight) hours as needed. 1 kit 0  . cyclobenzaprine (FLEXERIL) 10 MG tablet TAKE 1 TABLET BY MOUTH TWICE A DAY AS NEEDED FOR MUSCLE SPASMS 60 tablet 0  . Dimethyl Fumarate (TECFIDERA) 240 MG CPDR Take 1 capsule (240 mg total) by mouth 2 (two) times daily. 180 capsule 3  . DULoxetine (CYMBALTA) 30 MG capsule TAKE 1 CAPSULE BY MOUTH DAILY 30 capsule 5  . fluticasone (FLONASE) 50 MCG/ACT nasal spray PLACE ONE SPRAY INTO BOTH NOSTRILS DAILY AS NEEDED FOR ALLERGIES OR RHINITIS 16 g 0  . glucose blood (TRUE METRIX BLOOD GLUCOSE TEST) test strip Use as instructed 100 each 12  . losartan-hydrochlorothiazide (HYZAAR) 100-25 MG tablet Take 0.5 tablets by mouth daily. 30 tablet 6  . meclizine (ANTIVERT) 12.5 MG tablet Take 1 tablet (12.5 mg total) by mouth 3 (three) times daily as needed for dizziness. 30 tablet 3  . nicotine (NICODERM CQ - DOSED IN MG/24 HOURS) 14 mg/24hr patch Place 1 patch (14 mg total) onto the skin daily. 30 patch 1  . oxybutynin (DITROPAN) 5 MG tablet TAKE 1 TABLET (5  MG TOTAL) BY MOUTH TWO (TWO) TIMES DAILY. 60 tablet 0  . TRUEPLUS LANCETS 26G MISC 1 each by Does not apply route every 8 (eight) hours as needed. 100 each 12   No current facility-administered medications on file prior to visit.     Allergies  Allergen Reactions  . Pork-Derived Metallurgist    Social History   Socioeconomic History  . Marital status: Widowed    Spouse name: Not on file  . Number of children: Not on file  . Years of education: Not on file  . Highest education level: Not on file  Occupational History  . Not on file  Social Needs  . Financial resource strain: Not on file  . Food insecurity:    Worry: Not on file    Inability: Not on file    . Transportation needs:    Medical: Not on file    Non-medical: Not on file  Tobacco Use  . Smoking status: Current Every Day Smoker    Packs/day: 0.75    Years: 40.00    Pack years: 30.00    Types: Cigarettes  . Smokeless tobacco: Never Used  Substance and Sexual Activity  . Alcohol use: Yes    Alcohol/week: 0.0 standard drinks    Comment: occasional  . Drug use: No  . Sexual activity: Not on file  Lifestyle  . Physical activity:    Days per week: Not on file    Minutes per session: Not on file  . Stress: Not on file  Relationships  . Social connections:    Talks on phone: Not on file    Gets together: Not on file    Attends religious service: Not on file    Active member of club or organization: Not on file    Attends meetings of clubs or organizations: Not on file    Relationship status: Not on file  . Intimate partner violence:    Fear of current or ex partner: Not on file    Emotionally abused: Not on file    Physically abused: Not on file    Forced sexual activity: Not on file  Other Topics Concern  . Not on file  Social History Narrative   Lives with niece and her 3 children in a 2 story home.     Only goes upstairs once a day.  Has 1 child.  Does not work.  Trying to get disability.  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     Education: 10th grade.    Family History  Problem Relation Age of Onset  . Cancer Mother        Deceased  . Hypertension Mother   . Other Father        Deceased, 55  . HIV/AIDS Brother        Deceased  . Multiple sclerosis Daughter     Past Surgical History:  Procedure Laterality Date  . FOOT SURGERY Right     ROS: Review of Systems Negative except as stated above  PHYSICAL EXAM: BP (!) 150/95   Pulse 79   Temp 97.9 F (36.6 C) (Oral)   Resp 16   Wt 253 lb 6.4 oz (114.9 kg)   LMP 04/21/2014   SpO2 96%   BMI 43.50 kg/m   Physical Exam  General appearance - alert, well appearing, obese female and  in no distress Mental status - normal mood, behavior, speech, dress, motor activity, and thought processes Neck -  supple, no significant adenopathy or thyromegaly.  She has mild enlargement of the soft tissue above the left clavicle, slightly tender to touch.  I think this is fatty tissue but cannot exclude a soft tissue mass Chest - clear to auscultation, no wheezes, rales or rhonchi, symmetric air entry Heart - normal rate, regular rhythm, normal S1, S2, no murmurs, rubs, clicks or gallops Musculoskeletal -hands: Mild tenderness along the lateral aspect of both thumb and along the radial aspect of the wrist.  Grip is okay. Right foot: Mild tenderness at the insertion of the Achilles tendon.  No thickening or erythema of the tendon Patient ambulates with a cane. Extremities -no lower extremity edema.   Results for orders placed or performed in visit on 04/20/18  POCT glucose (manual entry)  Result Value Ref Range   POC Glucose 137 (A) 70 - 99 mg/dl  POCT glycosylated hemoglobin (Hb A1C)  Result Value Ref Range   Hemoglobin A1C     HbA1c POC (<> result, manual entry)     HbA1c, POC (prediabetic range) 5.8 5.7 - 6.4 %   HbA1c, POC (controlled diabetic range)      ASSESSMENT AND PLAN: 1. Essential hypertension Not at goal.  She is not taken her medication as yet for today.  Advised her to take when she returns home.  2. Tobacco abuse Encouraged her to use the nicotine patches consistently if indeed she is wanting to quit smoking  3. Pain in both lower extremities I think this is likely related to MS.  We will try her with low-dose of gabapentin at night - gabapentin (NEURONTIN) 300 MG capsule; Take 1 capsule (300 mg total) by mouth at bedtime.  Dispense: 90 capsule; Refill: 3  4. De Quervain's tenosynovitis, bilateral - diclofenac sodium (VOLTAREN) 1 % GEL; Apply 2 g topically 4 (four) times daily.  Dispense: 100 g; Refill: 2 - Ambulatory referral to Orthopedic Surgery  5. Neck  mass I think this is fatty tissue but we will go ahead and get an ultrasound to look at this area - US Soft Tissue Head/Neck; Future  6. Pain of right heel Think this is some tendinitis or enthesitis.  We will get an x-ray to rule out bone spur. - DG Ankle Complete Right; Future  7. Controlled type 2 diabetes mellitus without complication, without long-term current use of insulin (HCC) Diet controlled - POCT glucose (manual entry) - POCT glycosylated hemoglobin (Hb A1C)  Patient was given the opportunity to ask questions.  Patient verbalized understanding of the plan and was able to repeat key elements of the plan.   Orders Placed This Encounter  Procedures  . DG Ankle Complete Right  . US Soft Tissue Head/Neck  . Ambulatory referral to Orthopedic Surgery  . POCT glucose (manual entry)  . POCT glycosylated hemoglobin (Hb A1C)     Requested Prescriptions   Signed Prescriptions Disp Refills  . gabapentin (NEURONTIN) 300 MG capsule 90 capsule 3    Sig: Take 1 capsule (300 mg total) by mouth at bedtime.  . diclofenac sodium (VOLTAREN) 1 % GEL 100 g 2    Sig: Apply 2 g topically 4 (four) times daily.    Return in about 3 months (around 07/21/2018).  Karle Plumber, MD, FACP

## 2018-05-01 ENCOUNTER — Ambulatory Visit (INDEPENDENT_AMBULATORY_CARE_PROVIDER_SITE_OTHER): Payer: Medicare Other | Admitting: Orthopaedic Surgery

## 2018-05-01 ENCOUNTER — Encounter (INDEPENDENT_AMBULATORY_CARE_PROVIDER_SITE_OTHER): Payer: Self-pay | Admitting: Orthopaedic Surgery

## 2018-05-01 DIAGNOSIS — M654 Radial styloid tenosynovitis [de Quervain]: Secondary | ICD-10-CM | POA: Diagnosis not present

## 2018-05-01 MED ORDER — PREDNISONE 10 MG (21) PO TBPK: ORAL_TABLET | Freq: Every day | ORAL | 1 refills | Status: AC

## 2018-05-01 NOTE — Progress Notes (Signed)
Office Visit Note   Patient: Tiffany Brady           Date of Birth: 05-08-1961           MRN: [id number] Visit Date: 05/01/2018              Requested by: Ladell Pier, MD [address] [place], [place] [address] PCP: Ladell Pier, MD   Assessment & Plan: Visit Diagnoses:  1. De Quervain's tenosynovitis, bilateral     Plan: Impression is bilateral de Quervain's tenosynovitis left worse than right.  Today we discussed possible treatment options to include steroid injections.  Patient declined steroid injection today.  She will be fitted for bilateral thumb spica splints she will continue diclofenac gel or Tylenol as needed for pain.  We will also prescribe a course of prednisone Dosepak.  She will follow-up as needed if her pain persist.  Follow-Up Instructions: Return if symptoms worsen or fail to improve.   Orders:  No orders of the defined types were placed in this encounter.  Meds ordered this encounter  Medications  . predniSONE (STERAPRED UNI-PAK 21 TAB) 10 MG (21) TBPK tablet    Sig: Take by mouth daily for 6 days.    Dispense:  21 tablet    Refill:  1      Procedures: No procedures performed   Clinical Data: No additional findings.   Subjective: Chief Complaint  Patient presents with  . Left Wrist - Pain  . Right Wrist - Pain    HPI  Patient is a 57 year old, right-hand-dominant female presents with bilateral thumb and wrist pain.  She denies any mechanism of injury and her pain is been ongoing for several months.  The left is worse than the right.  She localizes her pain over the medial aspect of her thumb and wrist with slight radiation into the forearm.  Her pain is worse with use of her hand and in particular radial deviation of the wrist.  She has not found anything that provides relief.  She has been wearing a standard wrist brace that she says makes it more painful.  She cannot take NSAIDs due to renal issues.  She has been using Voltaren  gel is not been helpful.  She has no relief with Tylenol.  She denies any numbness or tingling into the hand.  She reports mild occasional swelling around the wrist.  She denies erythema or bruising.  She denies any history of gout  Review of Systems See HPI  Objective: Vital Signs: LMP 04/21/2014   Physical Exam  Awake, alert, patient appears comfortable.  Ambulates with cane  Ortho Exam Left hand: Inspection: No obvious deformity. No swelling, erythema or bruising Palpation: Tenderness over the first dorsal compartment.  No specific tenderness over the first [facility] ROM: Full ROM of the wrist.  Slight limitation with opposition of thumb with reported pain over the radial aspect of the wrist Strength: 5/5 strength Neurovascular: NV intact Special tests: Positive Finkelstein's, negative tinnel's at the carpal tunnel,    Righthand: Inspection: No obvious deformity. No swelling, erythema or bruising Palpation: Tenderness over the first dorsal compartment.  No specific tenderness over the first [facility] of the right hand ROM: Full ROM of the wrist.  Slight limitation with opposition of thumb with reported pain over the radial aspect of the wrist Strength: 5/5 strength Neurovascular: NV intact Special tests: Positive Finkelstein's, negative tinnel's at the carpal tunnel, no pain with compression and manipulation of the [facility]  Specialty Comments:  No specialty comments available.  Imaging: No results found.   PMFS History: Patient Active Problem List   Diagnosis Date Noted  . Stress incontinence 05/08/2017  . Cervical radiculopathy 05/08/2017  . CKD (chronic kidney disease) stage 3, GFR 30-59 ml/min (HCC) 04/04/2017  . Vitamin D deficiency 04/04/2017  . Controlled type 2 diabetes mellitus without complication, without long-term current use of insulin ([place]) 04/04/2017  . Fibromyalgia 03/28/2016  . Multiple sclerosis ([place]) 11/02/2015  . Tobacco abuse 04/24/2014  . Back muscle spasm  01/28/2014  . Migraine headache 07/23/2013  . Essential hypertension 06/27/2013  . Chronic pain syndrome 06/27/2013  . Obesity, unspecified 06/27/2013   Past Medical History:  Diagnosis Date  . Chronic kidney disease    pt reported kidney failure  . Chronic pain syndrome   . Gout   . Hypertension   . MS (multiple sclerosis) ([place])   . Tobacco use     Family History  Problem Relation Age of Onset  . Cancer Mother        Deceased  . Hypertension Mother   . Other Father        Deceased, 73  . HIV/AIDS Brother        Deceased  . Multiple sclerosis Daughter     Past Surgical History:  Procedure Laterality Date  . FOOT SURGERY Right    Social History   Occupational History  . Not on file  Tobacco Use  . Smoking status: Current Every Day Smoker    Packs/day: 0.75    Years: 40.00    Pack years: 30.00    Types: Cigarettes  . Smokeless tobacco: Never Used  Substance and Sexual Activity  . Alcohol use: Yes    Alcohol/week: 0.0 standard drinks    Comment: occasional  . Drug use: No  . Sexual activity: Not on file

## 2018-05-02 ENCOUNTER — Other Ambulatory Visit: Payer: Self-pay | Admitting: Internal Medicine

## 2018-05-02 DIAGNOSIS — I10 Essential (primary) hypertension: Secondary | ICD-10-CM

## 2018-05-02 DIAGNOSIS — J301 Allergic rhinitis due to pollen: Secondary | ICD-10-CM

## 2018-05-04 ENCOUNTER — Ambulatory Visit (HOSPITAL_COMMUNITY): Admission: RE | Admit: 2018-05-04 | Payer: Medicare Other | Source: Ambulatory Visit

## 2018-05-28 ENCOUNTER — Other Ambulatory Visit: Payer: Self-pay | Admitting: Internal Medicine

## 2018-05-28 DIAGNOSIS — Z1231 Encounter for screening mammogram for malignant neoplasm of breast: Secondary | ICD-10-CM

## 2018-06-17 NOTE — Progress Notes (Signed)
Follow-up Visit   Date: 06/18/18   Tiffany Brady MRN: [id number] DOB: 04/10/1961   Interim History: Tiffany Brady is a 57 y.o. right-handed African American female with chronic pain syndrome, fibromyaglia, hypertension, and tobacco use returning to the clinic for follow-up of multiple sclerosis.  The patient was accompanied to the clinic by significant other, Mr. Tiffany Brady.  History of present illness: Starting in July 2016, she began experiencing numbness/tingling over the tips of her fingers on both hands which has steadily involved the whole hand. She has multiple other symptoms including intermittent numbness over the right leg above the knees, pain related weakness and has difficulty with walking, whole body pain described as soreness which is tender to palpation. She has a diagnosis of fibromyalgia and takes Cymbalta which helps alleviate the pain where she can function, but it is still always there. Her ANA, RF, and TSH is normal. In October 2016, presented to her PCP's office with generalized weakness, gait difficulty, bowel and bladder incontinenced and concerned about MS due to her daughter diagnosed with this.  She underwent MRI brain which showed white matter changes involving the periventricular region and subcortical white matter, concerning for MS.  Although initial imaging also suggested cervical cord compression, subsequent imaging of her cervical spine only showed multilevel spinal stenosis without cord involvement.  She underwent CSF testing which showed normal IgG index, but there was increased oligoclonal bands both in the CSF and serum, but moreso apparent in the CSF.   She started gabapentin 388m BID but had not noticed any changes in her left arm.  She is not using wrist splints due to expense.   She has urge incontinence for the past 15 year, which is triggered by sneezing, coughing, or laughing. She does not have a regular OBGYN provider.  In early 2017, she was  treated with 3-days of IVMP, and did not notice any difference in any of her symptoms.  She continues to complain of a lot of myalgias and generalized pain throughout (knees, hips, elbows).  She does not see pain management and PCP manages her fibromyalgia.    UPDATE 04/19/2017:   She stopped all her medications about a month ago because of being stressed with her medications.  Around 2 months ago, she started having memory changes, speech difficulty described as stuttering and word-finding difficulty.  During the same time, she was extremely stressed [place] having dispute with her niece, whom she was living with.  [place] who ordered MRI brain, but she did not follow-up and is unsure of the results or the providers name.  [place], which has improved her state of mind and is now interested in restarting her medications.  She continues to have neck pain and whole-body pain due to fibromyalgia.   UPDATE 01/19/2018:   MRI brain was ordered at her last visit in January, but she did not follow through with this because she was not contacted with an appointment.  She continues to have generalized of the arm and legs from fibromyalgia.  She still has dizzy spells which lasts about 1 minute and improved by resting.  She does not have spinning sensation, nausea, or vomiting. She saw ENT who did not find inner ear pathology.  She also complains of bilateral hip and heel pain.   UPDATE 06/18/2018:  She  is here for 4 month follow-up visit.  Today, she continues to have intermittent spells of dizziness, described as room-spinning, without associated nausea or vomiting.  Duration can vary from a few seconds to all day.  She has suffered one fall.  She also has generalized weakness of the legs and feels as if they are unstable.  She walks with a cane.  Numbness/tingling  of the arms and legs is unchanged.  She has a lot of muscle tenderness and soreness.  She is constantly tired and feels weak. She continues to smoke, 0.3PPD./  Medications:  Current Outpatient Medications on File Prior to Visit  Medication Sig Dispense Refill  . aspirin EC 81 MG tablet Take 1 tablet (81 mg total) by mouth daily. 100 tablet 1  . Blood Glucose Monitoring Suppl (TRUE METRIX GO GLUCOSE METER) w/Device KIT 1 each by Does not apply route every 8 (eight) hours as needed. 1 kit 0  . cyclobenzaprine (FLEXERIL) 10 MG tablet TAKE 1 TABLET BY MOUTH TWICE A DAY AS NEEDED FOR MUSCLE SPASMS 60 tablet 0  . diclofenac sodium (VOLTAREN) 1 % GEL Apply 2 g topically 4 (four) times daily. 100 g 2  . Dimethyl Fumarate (TECFIDERA) 240 MG CPDR Take 1 capsule (240 mg total) by mouth 2 (two) times daily. 180 capsule 3  . DULoxetine (CYMBALTA) 30 MG capsule TAKE 1 CAPSULE BY MOUTH DAILY 30 capsule 5  . fluticasone (FLONASE) 50 MCG/ACT nasal spray PLACE ONE SPRAY INTO BOTH NOSTRILS DAILY AS NEEDED FOR ALLERGIES OR RHINITIS 16 g 2  . glucose blood (TRUE METRIX BLOOD GLUCOSE TEST) test strip Use as instructed 100 each 12  . losartan-hydrochlorothiazide (HYZAAR) 100-25 MG tablet TAKE ONE-HALF TABLET BY MOUTH DAILY 30 tablet 2  . nicotine (NICODERM CQ - DOSED IN MG/24 HOURS) 14 mg/24hr patch Place 1 patch (14 mg total) onto the skin daily. 30 patch 1  . TRUEPLUS LANCETS 26G MISC 1 each by Does not apply route every 8 (eight) hours as needed. 100 each 12   No current facility-administered medications on file prior to visit.     Allergies:  Allergies  Allergen Reactions  . Pork-Derived Products Hives    Review of Systems:  CONSTITUTIONAL: No fevers, chills, night sweats, or weight loss.  EYES: No visual changes or eye pain ENT: No hearing changes.  No history of nose bleeds.   RESPIRATORY: No cough, wheezing and shortness of breath.   CARDIOVASCULAR: Negative for chest pain, and palpitations.   GI:  Negative for abdominal discomfort, blood in stools or black stools.  No recent change in bowel habits.   GU:  No history of incontinence.   MUSCLOSKELETAL: +history of joint pain or swelling.  +myalgias.   SKIN: Negative for lesions, rash, and itching.   ENDOCRINE: Negative for cold or heat intolerance, polydipsia or goiter.   PSYCH:  No depression or anxiety symptoms.   NEURO: As Above.   Vital Signs:  BP 112/70   Pulse 84   Ht 5' 4"  (1.626 m)   Wt 263 lb (119.3 kg)   LMP 04/21/2014   SpO2 95%   BMI 45.14 kg/m   General Medical Exam:   General:  Well appearing, comfortable  Eyes/ENT: see cranial nerve examination.   Neck: No masses appreciated.  Full range of motion without tenderness.  No carotid bruits. Respiratory:  Clear to auscultation, good air entry bilaterally.   Cardiac:  Regular rate and rhythm, no murmur.   Ext:  Generalized tenderness of  the muscles to light palpation  Neurological Exam: MENTAL STATUS including orientation to time, place, person, recent and remote memory, attention span and concentration, language, and fund of knowledge is normal.  Speech is not dysarthric.  CRANIAL NERVES: No visual field defects. Pupils equal round and reactive to light.  Normal conjugate, extra-ocular eye movements in all directions of gaze. There is fatigable nystagmus to the right.  No ptosis.  Face is symmetric. Palate elevates symmetrically.  Tongue is midline.  MOTOR:  Motor strength is 5/5 in all extremities.  No pronator drift.  Tone is normal.    MSRs:  Reflexes are 3+/4 throughout.  SENSORY:  Intact to vibration, pin prick, and temperature.  COORDINATION/GAIT:  Normal finger to nose.  Finger tapping is inconsistently slowed (effort).  Gait is wide-based, nonphysiological with shakiness of the legs and appearance of buckling, assisted with walker   Data: Records received from [facility] in [place]  MRI brain with and without contrast  10/26/2016: Extensive periventricular hyperintensity which demonstrate distribution and morphology consistent with multiple sclerosis. There is no evidence of enhancement to suggest acute demyelination.  MRI cervical spine wwo contrast 07/31/2015: Multilevel degenerative change in the cervical spine with mild spinal stenosis at C3-4, C4-5, C5-6. Left foraminal narrowing at C5-6 due to disc and osteophyte.  Spinal cord appears normal without cord lesion or abnormal enhancement. No cord compression.  MRI brain wwo contrast 07/24/2015: Extensive and premature for age white matter signal abnormality strongly suspicious for multiple sclerosis. Chronic microvascular ischemic changes are not excluded, but are less favored.  No restricted diffusion or abnormal postcontrast enhancement at any location, but specifically in association with the white matter, to suggest an acute process.  Cervical spondylosis with suspected central disc protrusion/extrusion at C4-5 resulting in cord compression. This could contribute to numbness and pain in the extremities. Correlate clinically for radiculopathy or myelopathy. MRI cervical spine may be helpful in further evaluation as clinically indicated.  EMG of the upper extremities 05/26/2015: 1. Bilateral median neuropathy at or distal to the wrist, consistent with clinical diagnosis of carpal tunnel syndrome. Overall, these findings are mild in degree electrically and worse on the right. 2. There is no evidence of a cervical radiculopathy or diffuse myopathy affecting upper extremities.  MRI brain wwo contrast 02/01/2018: Numerous small periventricular white matter hyperintensities are stable since 2016 and are consistent with the clinical diagnosis of multiple sclerosis. No active lesions identified. Interval development of chronic microhemorrhage in the left thalamus and left occipital lobe, question hypertension  Labs 09/29/2014: RF 12, ANA neg, ESR 23  CSF  testing 08/14/2015:  R34 W0 P37 G67, IgG index 2.8, MPB < 2.0, OCB  >5 well defined gamma restriction bands that are also present in the patient's corresponding serum sample, but some bands in the CSF are more prominent   IMPRESSION/PLAN: 1. Relapsing remitting multiple sclerosis based on white matter changes on MRI brain and presence of oligoclonal bands in the CSF.  She was initially given 3-days Solumedrol without relief of whole body pain, suggesting that her whole body pain is not due to multiple sclerosis and most likely secondary to fibromyalgia.  She has been on Tecfidera since 2017 and is tolerating this.  She is getting financial assistance. MRI brain from May 2019 was viewed and is stable as compared to 2017 with stable periventricular white matter changes, no active lesions.  There is an interval left thalamic and left occipital microhemorrhage, likely hypertensive.  BP is well controlled.  -  Continue Tecfidera 249m twice daily.  - Continue vitamin D 4000 units  - Check CBC and CMP today and every 3 months.   2.  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  BP is well controlled.  I have strongly urged her to quit smoking to control risk factors for future events.    3.  Gait abnormality with brisk reflexes.  Check MRI lumbar spine wo for canal stenosis and MRI thoracic spine wwo contrast for spinal cord lesion.  Start physical therapy.  Prescription given for 4 prong cane.   4. Fatigue, check vitamin B12, vitamin D, and TSH  5.  Dizziness, unclear etiology.  No cerebellar lesions or pri5ary ENT pathology.  6.  Generalized myalgias due to fibromyalgia, followed by PCP  Return to clinic in 4   Thank you for allowing me to participate in patient's care.  If I can answer any additional questions, I would be pleased to do so.    Sincerely,    Avriana Joo K. PPosey Pronto DO

## 2018-06-18 ENCOUNTER — Ambulatory Visit (INDEPENDENT_AMBULATORY_CARE_PROVIDER_SITE_OTHER): Payer: Medicare Other | Admitting: Neurology

## 2018-06-18 ENCOUNTER — Other Ambulatory Visit: Payer: Self-pay | Admitting: Internal Medicine

## 2018-06-18 ENCOUNTER — Encounter: Payer: Self-pay | Admitting: Neurology

## 2018-06-18 ENCOUNTER — Telehealth: Payer: Self-pay | Admitting: Neurology

## 2018-06-18 VITALS — BP 112/70 | HR 84 | Ht 64.0 in | Wt 263.0 lb

## 2018-06-18 DIAGNOSIS — R202 Paresthesia of skin: Secondary | ICD-10-CM | POA: Diagnosis not present

## 2018-06-18 DIAGNOSIS — G35 Multiple sclerosis: Secondary | ICD-10-CM

## 2018-06-18 DIAGNOSIS — R2681 Unsteadiness on feet: Secondary | ICD-10-CM

## 2018-06-18 DIAGNOSIS — I619 Nontraumatic intracerebral hemorrhage, unspecified: Secondary | ICD-10-CM | POA: Diagnosis not present

## 2018-06-18 DIAGNOSIS — M79605 Pain in left leg: Secondary | ICD-10-CM

## 2018-06-18 DIAGNOSIS — R42 Dizziness and giddiness: Secondary | ICD-10-CM | POA: Diagnosis not present

## 2018-06-18 DIAGNOSIS — R292 Abnormal reflex: Secondary | ICD-10-CM | POA: Diagnosis not present

## 2018-06-18 DIAGNOSIS — M79604 Pain in right leg: Secondary | ICD-10-CM | POA: Diagnosis not present

## 2018-06-18 DIAGNOSIS — M797 Fibromyalgia: Secondary | ICD-10-CM

## 2018-06-18 MED ORDER — AMBULATORY NON FORMULARY MEDICATION: 0 refills | Status: DC

## 2018-06-18 NOTE — Patient Instructions (Addendum)
Check labs. Your provider has requested that you have labwork completed today. Please go to [facility] ([id number]) on the second floor of this building before leaving the office today. You do not need to check in. If you are not called within 15 minutes please check with the front desk.    MRI thoracic spine wwo contrast MRI lumbar spine without contrast We have sent a referral to [facility] for your MRI and they will call you directly to schedule your appt. They are located at [place]. If you need to contact them directly please call [phone].   Start physical therapy We will send a referral to [facility] and they will call you directly to set this up.   Return to clinic in 4 months

## 2018-06-18 NOTE — Telephone Encounter (Signed)
Referral faxed to [facility] at [phone] with confirmation received. They will contact patient to schedule.

## 2018-06-22 NOTE — Telephone Encounter (Signed)
error 

## 2018-06-27 DIAGNOSIS — M79604 Pain in right leg: Secondary | ICD-10-CM | POA: Diagnosis not present

## 2018-06-27 DIAGNOSIS — M79602 Pain in left arm: Secondary | ICD-10-CM | POA: Diagnosis not present

## 2018-06-27 DIAGNOSIS — M542 Cervicalgia: Secondary | ICD-10-CM | POA: Diagnosis not present

## 2018-06-27 DIAGNOSIS — M79605 Pain in left leg: Secondary | ICD-10-CM | POA: Diagnosis not present

## 2018-06-27 DIAGNOSIS — G35 Multiple sclerosis: Secondary | ICD-10-CM | POA: Diagnosis not present

## 2018-06-27 DIAGNOSIS — M79601 Pain in right arm: Secondary | ICD-10-CM | POA: Diagnosis not present

## 2018-06-27 DIAGNOSIS — R2681 Unsteadiness on feet: Secondary | ICD-10-CM | POA: Diagnosis not present

## 2018-07-02 ENCOUNTER — Telehealth: Payer: Self-pay | Admitting: Internal Medicine

## 2018-07-02 ENCOUNTER — Other Ambulatory Visit: Payer: Self-pay | Admitting: Internal Medicine

## 2018-07-02 DIAGNOSIS — G894 Chronic pain syndrome: Secondary | ICD-10-CM

## 2018-07-02 NOTE — Telephone Encounter (Signed)
Tiffany Brady

## 2018-07-06 ENCOUNTER — Ambulatory Visit
Admission: RE | Admit: 2018-07-06 | Discharge: 2018-07-06 | Disposition: A | Discharge status: Home / Self Care | Payer: Medicare Other | Source: Ambulatory Visit | Attending: Internal Medicine | Admitting: Internal Medicine

## 2018-07-06 ENCOUNTER — Other Ambulatory Visit (INDEPENDENT_AMBULATORY_CARE_PROVIDER_SITE_OTHER): Payer: Medicare Other

## 2018-07-06 DIAGNOSIS — G35 Multiple sclerosis: Secondary | ICD-10-CM | POA: Diagnosis not present

## 2018-07-06 DIAGNOSIS — Z1231 Encounter for screening mammogram for malignant neoplasm of breast: Secondary | ICD-10-CM | POA: Diagnosis not present

## 2018-07-06 DIAGNOSIS — R292 Abnormal reflex: Secondary | ICD-10-CM

## 2018-07-06 DIAGNOSIS — R202 Paresthesia of skin: Secondary | ICD-10-CM | POA: Diagnosis not present

## 2018-07-06 DIAGNOSIS — M79605 Pain in left leg: Secondary | ICD-10-CM | POA: Diagnosis not present

## 2018-07-06 DIAGNOSIS — R2681 Unsteadiness on feet: Secondary | ICD-10-CM

## 2018-07-06 DIAGNOSIS — M79604 Pain in right leg: Secondary | ICD-10-CM

## 2018-07-06 DIAGNOSIS — R6889 Other general symptoms and signs: Secondary | ICD-10-CM | POA: Diagnosis not present

## 2018-07-08 ENCOUNTER — Other Ambulatory Visit: Payer: Medicare Other

## 2018-07-09 ENCOUNTER — Telehealth: Payer: Self-pay | Admitting: *Deleted

## 2018-07-09 ENCOUNTER — Other Ambulatory Visit: Payer: Self-pay | Admitting: Internal Medicine

## 2018-07-09 DIAGNOSIS — M654 Radial styloid tenosynovitis [de Quervain]: Secondary | ICD-10-CM

## 2018-07-09 DIAGNOSIS — G894 Chronic pain syndrome: Secondary | ICD-10-CM

## 2018-07-09 LAB — TSH: TSH: 2.57 mIU/L (ref 0.40–4.50)

## 2018-07-09 LAB — VITAMIN B12: VITAMIN B 12: 325 pg/mL (ref 200–1100)

## 2018-07-09 LAB — VITAMIN D 1,25 DIHYDROXY
VITAMIN D3 1, 25 (OH): 18 pg/mL
Vitamin D 1, 25 (OH)2 Total: 18 pg/mL (ref 18–72)
Vitamin D2 1, 25 (OH)2: <8 pg/mL

## 2018-07-09 NOTE — Telephone Encounter (Signed)
Patient given results and instructions.   

## 2018-07-09 NOTE — Telephone Encounter (Signed)
-----   Message from [name], DO sent at 07/09/2018  8:07 AM EDT ----- Please let her know that vitamin D is low-normal - start vitamin D 5000units daily.  TSH and B12 is normal. Thanks.

## 2018-07-23 ENCOUNTER — Encounter: Payer: Self-pay | Admitting: Internal Medicine

## 2018-07-23 ENCOUNTER — Ambulatory Visit: Payer: Medicare Other | Attending: Internal Medicine | Admitting: Internal Medicine

## 2018-07-23 VITALS — BP 165/100 | HR 79 | Temp 97.8°F | Resp 16 | Wt 261.0 lb

## 2018-07-23 DIAGNOSIS — M79641 Pain in right hand: Secondary | ICD-10-CM | POA: Diagnosis not present

## 2018-07-23 DIAGNOSIS — I10 Essential (primary) hypertension: Secondary | ICD-10-CM

## 2018-07-23 DIAGNOSIS — M79604 Pain in right leg: Secondary | ICD-10-CM | POA: Insufficient documentation

## 2018-07-23 DIAGNOSIS — M79601 Pain in right arm: Secondary | ICD-10-CM | POA: Diagnosis not present

## 2018-07-23 DIAGNOSIS — M797 Fibromyalgia: Secondary | ICD-10-CM | POA: Insufficient documentation

## 2018-07-23 DIAGNOSIS — M62838 Other muscle spasm: Secondary | ICD-10-CM | POA: Diagnosis not present

## 2018-07-23 DIAGNOSIS — G35 Multiple sclerosis: Secondary | ICD-10-CM | POA: Diagnosis not present

## 2018-07-23 DIAGNOSIS — E119 Type 2 diabetes mellitus without complications: Secondary | ICD-10-CM

## 2018-07-23 DIAGNOSIS — M79605 Pain in left leg: Secondary | ICD-10-CM | POA: Diagnosis not present

## 2018-07-23 DIAGNOSIS — M79602 Pain in left arm: Secondary | ICD-10-CM | POA: Diagnosis not present

## 2018-07-23 DIAGNOSIS — G894 Chronic pain syndrome: Secondary | ICD-10-CM | POA: Insufficient documentation

## 2018-07-23 DIAGNOSIS — Z6841 Body Mass Index (BMI) 40.0 and over, adult: Secondary | ICD-10-CM | POA: Diagnosis not present

## 2018-07-23 DIAGNOSIS — M654 Radial styloid tenosynovitis [de Quervain]: Secondary | ICD-10-CM | POA: Diagnosis not present

## 2018-07-23 DIAGNOSIS — E1122 Type 2 diabetes mellitus with diabetic chronic kidney disease: Secondary | ICD-10-CM | POA: Diagnosis not present

## 2018-07-23 DIAGNOSIS — M79642 Pain in left hand: Secondary | ICD-10-CM | POA: Diagnosis not present

## 2018-07-23 DIAGNOSIS — Z23 Encounter for immunization: Secondary | ICD-10-CM | POA: Insufficient documentation

## 2018-07-23 DIAGNOSIS — Z720 Tobacco use: Secondary | ICD-10-CM

## 2018-07-23 DIAGNOSIS — I129 Hypertensive chronic kidney disease with stage 1 through stage 4 chronic kidney disease, or unspecified chronic kidney disease: Secondary | ICD-10-CM | POA: Insufficient documentation

## 2018-07-23 DIAGNOSIS — Z7982 Long term (current) use of aspirin: Secondary | ICD-10-CM | POA: Diagnosis not present

## 2018-07-23 DIAGNOSIS — Z8249 Family history of ischemic heart disease and other diseases of the circulatory system: Secondary | ICD-10-CM | POA: Diagnosis not present

## 2018-07-23 DIAGNOSIS — E669 Obesity, unspecified: Secondary | ICD-10-CM | POA: Insufficient documentation

## 2018-07-23 DIAGNOSIS — E559 Vitamin D deficiency, unspecified: Secondary | ICD-10-CM | POA: Insufficient documentation

## 2018-07-23 DIAGNOSIS — Z79899 Other long term (current) drug therapy: Secondary | ICD-10-CM | POA: Insufficient documentation

## 2018-07-23 DIAGNOSIS — N183 Chronic kidney disease, stage 3 (moderate): Secondary | ICD-10-CM | POA: Diagnosis not present

## 2018-07-23 DIAGNOSIS — F17210 Nicotine dependence, cigarettes, uncomplicated: Secondary | ICD-10-CM | POA: Insufficient documentation

## 2018-07-23 LAB — GLUCOSE, POCT (MANUAL RESULT ENTRY): POC Glucose: 112 mg/dl — AB (ref 70–99)

## 2018-07-23 MED ORDER — TETANUS-DIPHTH-ACELL PERTUSSIS 5-2.5-18.5 LF-MCG/0.5 IM SUSP: 0.5000 mL | Freq: Once | INTRAMUSCULAR | 0 refills | Status: AC

## 2018-07-23 MED ORDER — PREGABALIN 25 MG PO CAPS: 25.0000 mg | ORAL_CAPSULE | Freq: Two times a day (BID) | ORAL | 2 refills | Status: DC

## 2018-07-23 MED ORDER — GLUCOSE BLOOD VI STRP: ORAL_STRIP | 12 refills | Status: DC

## 2018-07-23 MED ORDER — CYCLOBENZAPRINE HCL 10 MG PO TABS: 10.0000 mg | ORAL_TABLET | Freq: Two times a day (BID) | ORAL | 2 refills | Status: DC | PRN

## 2018-07-23 NOTE — Patient Instructions (Addendum)
Stop gabapentin.  Start Lyrica instead as discussed.  You have been referred for some physical therapy.  Please take your blood pressure daily as prescribed. I have sent a prescription to your pharmacy for refill on your test strips.    Influenza Virus Vaccine injection (Fluarix) What is this medicine? INFLUENZA VIRUS VACCINE (in floo EN zuh VAHY ruhs vak SEEN) helps to reduce the risk of getting influenza also known as the flu. This medicine may be used for other purposes; ask your health care provider or pharmacist if you have questions. COMMON BRAND NAME(S): Fluarix, Fluzone What should I tell my health care provider before I take this medicine? They need to know if you have any of these conditions: -bleeding disorder like hemophilia -fever or infection -Guillain-Barre syndrome or other neurological problems -immune system problems -infection with the human immunodeficiency virus (HIV) or AIDS -low blood platelet counts -multiple sclerosis -an unusual or allergic reaction to influenza virus vaccine, eggs, chicken proteins, latex, gentamicin, other medicines, foods, dyes or preservatives -pregnant or trying to get pregnant -breast-feeding How should I use this medicine? This vaccine is for injection into a muscle. It is given by a health care professional. A copy of Vaccine Information Statements will be given before each vaccination. Read this sheet carefully each time. The sheet may change frequently. Talk to your pediatrician regarding the use of this medicine in children. Special care may be needed. Overdosage: If you think you have taken too much of this medicine contact a poison control center or emergency room at once. NOTE: This medicine is only for you. Do not share this medicine with others. What if I miss a dose? This does not apply. What may interact with this medicine? -chemotherapy or radiation therapy -medicines that lower your immune system like etanercept,  anakinra, infliximab, and adalimumab -medicines that treat or prevent blood clots like warfarin -phenytoin -steroid medicines like prednisone or cortisone -theophylline -vaccines This list may not describe all possible interactions. Give your health care provider a list of all the medicines, herbs, non-prescription drugs, or dietary supplements you use. Also tell them if you smoke, drink alcohol, or use illegal drugs. Some items may interact with your medicine. What should I watch for while using this medicine? Report any side effects that do not go away within 3 days to your doctor or health care professional. Call your health care provider if any unusual symptoms occur within 6 weeks of receiving this vaccine. You may still catch the flu, but the illness is not usually as bad. You cannot get the flu from the vaccine. The vaccine will not protect against colds or other illnesses that may cause fever. The vaccine is needed every year. What side effects may I notice from receiving this medicine? Side effects that you should report to your doctor or health care professional as soon as possible: -allergic reactions like skin rash, itching or hives, swelling of the face, lips, or tongue Side effects that usually do not require medical attention (report to your doctor or health care professional if they continue or are bothersome): -fever -headache -muscle aches and pains -pain, tenderness, redness, or swelling at site where injected -weak or tired This list may not describe all possible side effects. Call your doctor for medical advice about side effects. You may report side effects to FDA at 1-800-FDA-1088. Where should I keep my medicine? This vaccine is only given in a clinic, pharmacy, doctor's office, or other health care setting and will not be  stored at home. NOTE: This sheet is a summary. It may not cover all possible information. If you have questions about this medicine, talk to your  doctor, pharmacist, or health care provider.  2018 Elsevier/Gold Standard (2008-03-26 09:30:40)   Td Vaccine (Tetanus and Diphtheria): What You Need to Know 1. Why get vaccinated? Tetanus  and diphtheria are very serious diseases. They are rare in the [place] today, but people who do become infected often have severe complications. Td vaccine is used to protect adolescents and adults from both of these diseases. Both tetanus and diphtheria are infections caused by bacteria. Diphtheria spreads from person to person through coughing or sneezing. Tetanus-causing bacteria enter the body through cuts, scratches, or wounds. TETANUS (lockjaw) causes painful muscle tightening and stiffness, usually all over the body.  It can lead to tightening of muscles in the head and neck so you can't open your mouth, swallow, or sometimes even breathe. Tetanus kills about 1 out of every 10 people who are infected even after receiving the best medical care.  DIPHTHERIA can cause a thick coating to form in the back of the throat.  It can lead to breathing problems, paralysis, heart failure, and death.  Before vaccines, as many as 200,000 cases of diphtheria and hundreds of cases of tetanus were reported in the [place] each year. Since vaccination began, reports of cases for both diseases have dropped by about 99%. 2. Td vaccine Td vaccine can protect adolescents and adults from tetanus and diphtheria. Td is usually given as a booster dose every 10 years but it can also be given earlier after a severe and dirty wound or burn. Another vaccine, called Tdap, which protects against pertussis in addition to tetanus and diphtheria, is sometimes recommended instead of Td vaccine. Your doctor or the person giving you the vaccine can give you more information. Td may safely be given at the same time as other vaccines. 3. Some people should not get this vaccine  A person who has ever had a life-threatening  allergic reaction after a previous dose of any tetanus or diphtheria containing vaccine, OR has a severe allergy to any part of this vaccine, should not get Td vaccine. Tell the person giving the vaccine about any severe allergies.  Talk to your doctor if you: ? had severe pain or swelling after any vaccine containing diphtheria or tetanus, ? ever had a condition called Guillain Barre Syndrome (GBS), ? aren't feeling well on the day the shot is scheduled. 4. What are the risks from Td vaccine? With any medicine, including vaccines, there is a chance of side effects. These are usually mild and go away on their own. Serious reactions are also possible but are rare. Most people who get Td vaccine do not have any problems with it. Mild problems following Td vaccine: (Did not interfere with activities)  Pain where the shot was given (about 8 people in 10)  Redness or swelling where the shot was given (about 1 person in 4)  Mild fever (rare)  Headache (about 1 person in 4)  Tiredness (about 1 person in 4)  Moderate problems following Td vaccine: (Interfered with activities, but did not require medical attention)  Fever over 102F (rare)  Severe problems following Td vaccine: (Unable to perform usual activities; required medical attention)  Swelling, severe pain, bleeding and/or redness in the arm where the shot was given (rare).  Problems that could happen after any vaccine:  People sometimes faint after a medical  procedure, including vaccination. Sitting or lying down for about 15 minutes can help prevent fainting, and injuries caused by a fall. Tell your doctor if you feel dizzy, or have vision changes or ringing in the ears.  Some people get severe pain in the shoulder and have difficulty moving the arm where a shot was given. This happens very rarely.  Any medication can cause a severe allergic reaction. Such reactions from a vaccine are very rare, estimated at fewer than 1 in a  million doses, and would happen within a few minutes to a few hours after the vaccination. As with any medicine, there is a very remote chance of a vaccine causing a serious injury or death. The safety of vaccines is always being monitored. For more information, visit: http://www.aguilar.org/ 5. What if there is a serious reaction? What should I look for? Look for anything that concerns you, such as signs of a severe allergic reaction, very high fever, or unusual behavior. Signs of a severe allergic reaction can include hives, swelling of the face and throat, difficulty breathing, a fast heartbeat, dizziness, and weakness. These would usually start a few minutes to a few hours after the vaccination. What should I do?  If you think it is a severe allergic reaction or other emergency that can't wait, call 9-1-1 or get the person to the nearest hospital. Otherwise, call your doctor.  Afterward, the reaction should be reported to the Vaccine Adverse Event Reporting System (VAERS). Your doctor might file this report, or you can do it yourself through the VAERS web site at www.vaers.SamedayNews.es, or by calling 431-358-7030. ? VAERS does not give medical advice. 6. The National Vaccine Injury Compensation Program The [employer] Vaccine Injury Compensation Program (VICP) is a federal program that was created to compensate people who may have been injured by certain vaccines. Persons who believe they may have been injured by a vaccine can learn about the program and about filing a claim by calling 2543399429 or visiting the [place] website at GoldCloset.com.ee. There is a time limit to file a claim for compensation. 7. How can I learn more?  Ask your doctor. He or she can give you the vaccine package insert or suggest other sources of information.  Call your local or state health department.  Contact the Centers for Disease Control and Prevention (CDC): ? Call (908)754-7451  (1-800-CDC-INFO) ? Visit CDC's website at http://hunter.com/ CDC Td Vaccine VIS (12/22/15) This information is not intended to replace advice given to you by your health care provider. Make sure you discuss any questions you have with your health care provider. Document Released: 06/26/2006 Document Revised: 05/19/2016 Document Reviewed: 05/19/2016 Elsevier Interactive Patient Education  2017 [employer].

## 2018-07-23 NOTE — Progress Notes (Signed)
Patient ID: Tiffany Brady, female    DOB: 16-Sep-1960  MRN: [id number]  CC: Diabetes   Subjective: Tiffany Brady is a 57 y.o. female who presents for chronic ds management.  Her concerns today include:  Pt with hx of DM type 2, HTN, Tob, obesity, fibromyalgia, CKD stage 3 and MS.  HTN:  Compliant with Cozaar/HCTZ and salt restriction.  Did not take med as yet for the a.m.  She thinks it is elve because she is in pain.  BP on recent visit with neurologist was 112/70  DM:  Out of stripes; request RF. Reports that she eats only once a day due to decrease appetite  Tob dependence: Smoking less because "sometimes I can't get out side because I can't smoke in the house."  Smoking 1 pk/Q3days improved from 1 pk/Q 2day. Did not tolerate Chantix.  Has the patches which she uses occasionally.    C/O increasing pain all over.  "My legs, my arms and my hands."  Prescribed Gabapentin on last visit to help with nocturnal leg pain.  She felt she was in more pain when she took it. Last took it about 4-5 days ago.  Had a fall 3 wks ago.  "My legs get weak."  Thinks she gets swelling in the wrists along the thumb.  Saw Dr. Erlinda Hong 04/2018 per my referral for De Quervain's tenosynovitis.  She declined steroid injection.  He recommended thumb splints which she has been using.  She would like to see him again to get the injections. -Complains of muscle spasms in legs and arms.  Request RF on Flexeril -Her neurologist has ordered MRI of the lumbar and thoracic spine given her complaints of weakness in the legs and recent fall. Patient Active Problem List   Diagnosis Date Noted  . Stress incontinence 05/08/2017  . Cervical radiculopathy 05/08/2017  . CKD (chronic kidney disease) stage 3, GFR 30-59 ml/min (HCC) 04/04/2017  . Vitamin D deficiency 04/04/2017  . Controlled type 2 diabetes mellitus without complication, without long-term current use of insulin ([place]) 04/04/2017  . Fibromyalgia 03/28/2016  .  Multiple sclerosis ([place]) 11/02/2015  . Tobacco abuse 04/24/2014  . Back muscle spasm 01/28/2014  . Migraine headache 07/23/2013  . Essential hypertension 06/27/2013  . Chronic pain syndrome 06/27/2013  . Obesity, unspecified 06/27/2013     Current Outpatient Medications on File Prior to Visit  Medication Sig Dispense Refill  . AMBULATORY NON FORMULARY MEDICATION Walker (4 prong) DX: G35 1 Device 0  . aspirin EC 81 MG tablet Take 1 tablet (81 mg total) by mouth daily. 100 tablet 1  . Blood Glucose Monitoring Suppl (TRUE METRIX GO GLUCOSE METER) w/Device KIT 1 each by Does not apply route every 8 (eight) hours as needed. 1 kit 0  . diclofenac sodium (VOLTAREN) 1 % GEL APPLY TWO GRAMS TOPICALLY FOUR TIMES A DAY 100 g 1  . Dimethyl Fumarate (TECFIDERA) 240 MG CPDR Take 1 capsule (240 mg total) by mouth 2 (two) times daily. 180 capsule 3  . DULoxetine (CYMBALTA) 30 MG capsule TAKE ONE CAPSULE BY MOUTH DAILY 30 capsule 2  . fluticasone (FLONASE) 50 MCG/ACT nasal spray PLACE ONE SPRAY INTO BOTH NOSTRILS DAILY AS NEEDED FOR ALLERGIES OR RHINITIS 16 g 2  . losartan-hydrochlorothiazide (HYZAAR) 100-25 MG tablet TAKE ONE-HALF TABLET BY MOUTH DAILY 30 tablet 2  . nicotine (NICODERM CQ - DOSED IN MG/24 HOURS) 14 mg/24hr patch Place 1 patch (14 mg total) onto the skin daily. 30 patch 1  .  TRUEPLUS LANCETS 26G MISC 1 each by Does not apply route every 8 (eight) hours as needed. 100 each 12   No current facility-administered medications on file prior to visit.     Allergies  Allergen Reactions  . Pork-Derived Metallurgist    Social History   Socioeconomic History  . Marital status: Widowed    Spouse name: Not on file  . Number of children: Not on file  . Years of education: Not on file  . Highest education level: Not on file  Occupational History  . Not on file  Social Needs  . Financial resource strain: Not on file  . Food insecurity:    Worry: Not on file    Inability: Not on file    . Transportation needs:    Medical: Not on file    Non-medical: Not on file  Tobacco Use  . Smoking status: Current Every Day Smoker    Packs/day: 0.75    Years: 40.00    Pack years: 30.00    Types: Cigarettes  . Smokeless tobacco: Never Used  Substance and Sexual Activity  . Alcohol use: Yes    Alcohol/week: 0.0 standard drinks    Comment: occasional  . Drug use: No  . Sexual activity: Not on file  Lifestyle  . Physical activity:    Days per week: Not on file    Minutes per session: Not on file  . Stress: Not on file  Relationships  . Social connections:    Talks on phone: Not on file    Gets together: Not on file    Attends religious service: Not on file    Active member of club or organization: Not on file    Attends meetings of clubs or organizations: Not on file    Relationship status: Not on file  . Intimate partner violence:    Fear of current or ex partner: Not on file    Emotionally abused: Not on file    Physically abused: Not on file    Forced sexual activity: Not on file  Other Topics Concern  . Not on file  Social History Narrative   Lives with niece and her 3 children in a 2 story home.     Only goes upstairs once a day.  Has 1 child.  Does not work.  Trying to get disability.  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     Education: 10th grade.    Family History  Problem Relation Age of Onset  . Cancer Mother        Deceased  . Hypertension Mother   . Other Father        Deceased, 4  . HIV/AIDS Brother        Deceased  . Multiple sclerosis Daughter   . Breast cancer Neg Hx     Past Surgical History:  Procedure Laterality Date  . FOOT SURGERY Right     ROS: Review of Systems Negative except as stated above PHYSICAL EXAM: BP (!) 165/100 (BP Location: Right Arm, Patient Position: Sitting, Cuff Size: Large)   Pulse 79   Temp 97.8 F (36.6 C) (Oral)   Resp 16   Wt 261 lb (118.4 kg)   LMP 04/21/2014   SpO2 97%   BMI 44.80  kg/m   Physical Exam  General appearance - alert, well appearing, obese middle-aged African-American female and in no distress Mental status - normal mood, behavior, speech, dress, motor activity, and thought  processes Neck - supple, no significant adenopathy Chest - clear to auscultation, no wheezes, rales or rhonchi, symmetric air entry Heart - normal rate, regular rhythm, normal S1, S2, no murmurs, rubs, clicks or gallops Musculoskeletal -patient ambulates with a cane.  Wrists: No edema or erythema.  Mild tenderness on palpation along the lateral aspect of the thumb.  Discomfort with range of motion of the thumb Extremities -no lower extremity edema Results for orders placed or performed in visit on 07/23/18  POCT glucose (manual entry)  Result Value Ref Range   POC Glucose 112 (A) 70 - 99 mg/dl   Lab Results  Component Value Date   HGBA1C 5.8 04/20/2018   ASSESSMENT AND PLAN:  1. Controlled type 2 diabetes mellitus without complication, without long-term current use of insulin (HCC) Last A1c was at goal.  Refill given on test strips so that she can check her blood sugars at least once a day.  Healthy eating habits encouraged. - POCT glucose (manual entry) - Microalbumin / creatinine urine ratio - glucose blood (TRUE METRIX BLOOD GLUCOSE TEST) test strip; Use as instructed  Dispense: 100 each; Refill: 12  2. Essential hypertension Not at goal.  However no change made in medication as patient has not taken medicine as yet for today.  Advised patient to take her medicine every morning as prescribed. - Basic Metabolic Panel  3. Tobacco abuse Advised to quit.  Commended her on cutting back.  Encourage her to use the nicotine patches consistently if they are helpful in helping to reduce cravings for cigarettes  4. De Quervain's tenosynovitis, bilateral Will refer back to Dr. Erlinda Hong per her request  5. Multiple sclerosis (HCC) - cyclobenzaprine (FLEXERIL) 10 MG tablet; Take 1 tablet  (10 mg total) by mouth 2 (two) times daily as needed for muscle spasms.  Dispense: 40 tablet; Refill: 2  6. Fibromyalgia Stop gabapentin.  Recommend referral to physical therapy to help direct her towards graded exercise. We will try her with low-dose Lyrica - pregabalin (LYRICA) 25 MG capsule; Take 1 capsule (25 mg total) by mouth 2 (two) times daily.  Dispense: 60 capsule; Refill: 2 - Ambulatory referral to Physical Therapy  7. Need for Tdap vaccination Given today.  8. Need for immunization against influenza Given today. - Flu Vaccine QUAD 36+ mos IM    Patient was given the opportunity to ask questions.  Patient verbalized understanding of the plan and was able to repeat key elements of the plan.   Orders Placed This Encounter  Procedures  . Flu Vaccine QUAD 36+ mos IM  . Microalbumin / creatinine urine ratio  . Basic Metabolic Panel  . Ambulatory referral to Physical Therapy  . POCT glucose (manual entry)   Requested Prescriptions   Signed Prescriptions Disp Refills  . Tdap (BOOSTRIX) 5-2.5-18.5 LF-MCG/0.5 injection 0.5 mL 0    Sig: Inject 0.5 mLs into the muscle once for 1 dose.  Marland Kitchen glucose blood (TRUE METRIX BLOOD GLUCOSE TEST) test strip 100 each 12    Sig: Use as instructed  . pregabalin (LYRICA) 25 MG capsule 60 capsule 2    Sig: Take 1 capsule (25 mg total) by mouth 2 (two) times daily.  . cyclobenzaprine (FLEXERIL) 10 MG tablet 40 tablet 2    Sig: Take 1 tablet (10 mg total) by mouth 2 (two) times daily as needed for muscle spasms.  . Tdap (BOOSTRIX) 5-2.5-18.5 LF-MCG/0.5 injection 0.5 mL 0    Sig: Inject 0.5 mLs into the muscle once for  1 dose.    Return in about 3 months (around 10/23/2018).  Karle Plumber, MD, FACP

## 2018-07-23 NOTE — Progress Notes (Signed)
Pt states she is having pain all over

## 2018-07-24 LAB — BASIC METABOLIC PANEL
BUN / CREAT RATIO: 11 (ref 9–23)
BUN: 16 mg/dL (ref 6–24)
CHLORIDE: 103 mmol/L (ref 96–106)
CO2: 22 mmol/L (ref 20–29)
CREATININE: 1.47 mg/dL — AB (ref 0.57–1.00)
Calcium: 9.7 mg/dL (ref 8.7–10.2)
GFR calc Af Amer: 45 mL/min/{1.73_m2} — ABNORMAL LOW (ref >59)
GFR calc non Af Amer: 39 mL/min/{1.73_m2} — ABNORMAL LOW (ref >59)
GLUCOSE: 101 mg/dL — AB (ref 65–99)
POTASSIUM: 4.4 mmol/L (ref 3.5–5.2)
SODIUM: 142 mmol/L (ref 134–144)

## 2018-07-24 LAB — MICROALBUMIN / CREATININE URINE RATIO
CREATININE, UR: 196.3 mg/dL
MICROALB/CREAT RATIO: 5.5 mg/g{creat} (ref 0.0–30.0)
Microalbumin, Urine: 10.8 ug/mL

## 2018-08-02 ENCOUNTER — Ambulatory Visit (INDEPENDENT_AMBULATORY_CARE_PROVIDER_SITE_OTHER): Payer: Medicare Other | Admitting: Orthopaedic Surgery

## 2018-08-02 DIAGNOSIS — Z6841 Body Mass Index (BMI) 40.0 and over, adult: Secondary | ICD-10-CM | POA: Diagnosis not present

## 2018-08-02 DIAGNOSIS — M654 Radial styloid tenosynovitis [de Quervain]: Secondary | ICD-10-CM | POA: Diagnosis not present

## 2018-08-02 MED ORDER — LIDOCAINE HCL 1 % IJ SOLN
0.3000 mL | INTRAMUSCULAR | Status: AC | PRN
  Administered 2018-08-02: .3 mL

## 2018-08-02 MED ORDER — METHYLPREDNISOLONE ACETATE 40 MG/ML IJ SUSP
13.3300 mg | INTRAMUSCULAR | Status: AC | PRN
  Administered 2018-08-02: 13.33 mg

## 2018-08-02 MED ORDER — BUPIVACAINE HCL 0.25 % IJ SOLN
0.3300 mL | INTRAMUSCULAR | Status: AC | PRN
  Administered 2018-08-02: .33 mL

## 2018-08-02 NOTE — Progress Notes (Signed)
Office Visit Note   Patient: Tiffany Brady           Date of Birth: [birth year]           MRN: [id number] Visit Date: 08/02/2018              Requested by: Ladell Pier, MD [address] [place], [place] [address] PCP: Ladell Pier, MD   Assessment & Plan: Visit Diagnoses:  1. De Quervain's disease (tenosynovitis)   2. Morbid obesity ([place])   3. Body mass index 40.0-44.9, adult (HCC)     Plan: Impression is de Quervain's Tina synovitis left greater than right.  Today, we injected both of these with cortisone.  She will continue her thumb spica splints as needed.  Follow-up with [name] as needed.  Of note, we did discuss surgical intervention to include first dorsal compartment release. The patient meets the AMA guidelines for Morbid (severe) obesity with a BMI > 40.0 and I have recommended weight loss.\   Follow-Up Instructions: Return if symptoms worsen or fail to improve.   Orders:  Orders Placed This Encounter  Procedures  . Hand/UE Inj: bilateral extensor compartment 1   No orders of the defined types were placed in this encounter.     Procedures: Hand/UE Inj: bilateral extensor compartment 1 for de Quervain's tenosynovitis on 08/02/2018 10:30 AM Medications (Right): 0.3 mL lidocaine 1 %; 0.33 mL bupivacaine 0.25 %; 13.33 mg methylPREDNISolone acetate 40 MG/ML Medications (Left): 0.3 mL lidocaine 1 %; 0.33 mL bupivacaine 0.25 %; 13.33 mg methylPREDNISolone acetate 40 MG/ML      Clinical Data: No additional findings.   Subjective: Chief Complaint  Patient presents with  . Left Wrist - Follow-up  . Right Wrist - Follow-up    HPI patient is a pleasant 57 year old female who presents to our clinic today with bilateral wrist pain left greater than right.  History of de Quervain's tenosynovitis for the past several years.  She was seen in our office in August 2019 for this.  She was provided thumb spica splints and topical anti-inflammatories as she is  unable to take oral anti-inflammatories due to underlying kidney issues.  She has noticed minimal improvement in symptoms since wearing the splints.  She has increased pain when she is cooking or washing dishes.  She has not tried any other medications.  Review of Systems as detailed in HPI.  All others reviewed and are negative.   Objective: Vital Signs: LMP 04/21/2014   Physical Exam well-developed well-nourished female no acute distress.  Alert and oriented x3.  Ortho Exam's examination of both wrists reveal marked tenderness to the first dorsal compartment left greater than right.  Markedly positive Wynn Maudlin.  Negative grind both CMC joint.  She is neurovascularly intact distally.  Specialty Comments:  No specialty comments available.  Imaging: No new imaging   PMFS History: Patient Active Problem List   Diagnosis Date Noted  . De Quervain's disease (tenosynovitis) 08/02/2018  . Body mass index 40.0-44.9, adult ([place]) 08/02/2018  . Stress incontinence 05/08/2017  . Cervical radiculopathy 05/08/2017  . CKD (chronic kidney disease) stage 3, GFR 30-59 ml/min (HCC) 04/04/2017  . Vitamin D deficiency 04/04/2017  . Controlled type 2 diabetes mellitus without complication, without long-term current use of insulin ([place]) 04/04/2017  . Fibromyalgia 03/28/2016  . Multiple sclerosis ([place]) 11/02/2015  . Tobacco abuse 04/24/2014  . Back muscle spasm 01/28/2014  . Migraine headache 07/23/2013  . Essential hypertension 06/27/2013  . Chronic pain syndrome  06/27/2013  . Morbid obesity ([place]) 06/27/2013   Past Medical History:  Diagnosis Date  . Chronic kidney disease    pt reported kidney failure  . Chronic pain syndrome   . Gout   . Hypertension   . MS (multiple sclerosis) ([place])   . Tobacco use     Family History  Problem Relation Age of Onset  . Cancer Mother        Deceased  . Hypertension Mother   . Other Father        Deceased, 68  . HIV/AIDS Brother        Deceased    . Multiple sclerosis Daughter   . Breast cancer Neg Hx     Past Surgical History:  Procedure Laterality Date  . FOOT SURGERY Right    Social History   Occupational History  . Not on file  Tobacco Use  . Smoking status: Current Every Day Smoker    Packs/day: 0.75    Years: 40.00    Pack years: 30.00    Types: Cigarettes  . Smokeless tobacco: Never Used  Substance and Sexual Activity  . Alcohol use: Yes    Alcohol/week: 0.0 standard drinks    Comment: occasional  . Drug use: No  . Sexual activity: Not on file

## 2018-08-21 ENCOUNTER — Encounter (HOSPITAL_COMMUNITY): Payer: Self-pay | Admitting: *Deleted

## 2018-08-21 ENCOUNTER — Inpatient Hospital Stay (HOSPITAL_COMMUNITY)
Admission: EM | Admit: 2018-08-21 | Discharge: 2018-08-23 | DRG: 247 | Disposition: A | Discharge status: Home / Self Care | Payer: Medicare Other | Attending: Interventional Cardiology | Admitting: Interventional Cardiology

## 2018-08-21 ENCOUNTER — Other Ambulatory Visit: Payer: Self-pay

## 2018-08-21 ENCOUNTER — Emergency Department (HOSPITAL_COMMUNITY): Payer: Medicare Other

## 2018-08-21 DIAGNOSIS — Z91018 Allergy to other foods: Secondary | ICD-10-CM

## 2018-08-21 DIAGNOSIS — G4730 Sleep apnea, unspecified: Secondary | ICD-10-CM | POA: Diagnosis present

## 2018-08-21 DIAGNOSIS — E785 Hyperlipidemia, unspecified: Secondary | ICD-10-CM | POA: Diagnosis present

## 2018-08-21 DIAGNOSIS — I129 Hypertensive chronic kidney disease with stage 1 through stage 4 chronic kidney disease, or unspecified chronic kidney disease: Secondary | ICD-10-CM | POA: Diagnosis present

## 2018-08-21 DIAGNOSIS — Z79899 Other long term (current) drug therapy: Secondary | ICD-10-CM | POA: Diagnosis not present

## 2018-08-21 DIAGNOSIS — I214 Non-ST elevation (NSTEMI) myocardial infarction: Secondary | ICD-10-CM | POA: Diagnosis present

## 2018-08-21 DIAGNOSIS — F410 Panic disorder [episodic paroxysmal anxiety] without agoraphobia: Secondary | ICD-10-CM | POA: Diagnosis not present

## 2018-08-21 DIAGNOSIS — Z8249 Family history of ischemic heart disease and other diseases of the circulatory system: Secondary | ICD-10-CM | POA: Diagnosis not present

## 2018-08-21 DIAGNOSIS — I2510 Atherosclerotic heart disease of native coronary artery without angina pectoris: Secondary | ICD-10-CM | POA: Diagnosis present

## 2018-08-21 DIAGNOSIS — R112 Nausea with vomiting, unspecified: Secondary | ICD-10-CM | POA: Diagnosis not present

## 2018-08-21 DIAGNOSIS — J9811 Atelectasis: Secondary | ICD-10-CM | POA: Diagnosis present

## 2018-08-21 DIAGNOSIS — Z820 Family history of epilepsy and other diseases of the nervous system: Secondary | ICD-10-CM | POA: Diagnosis not present

## 2018-08-21 DIAGNOSIS — N183 Chronic kidney disease, stage 3 (moderate): Secondary | ICD-10-CM | POA: Diagnosis present

## 2018-08-21 DIAGNOSIS — G894 Chronic pain syndrome: Secondary | ICD-10-CM | POA: Diagnosis present

## 2018-08-21 DIAGNOSIS — Z7982 Long term (current) use of aspirin: Secondary | ICD-10-CM

## 2018-08-21 DIAGNOSIS — I10 Essential (primary) hypertension: Secondary | ICD-10-CM | POA: Diagnosis not present

## 2018-08-21 DIAGNOSIS — E1122 Type 2 diabetes mellitus with diabetic chronic kidney disease: Secondary | ICD-10-CM | POA: Diagnosis present

## 2018-08-21 DIAGNOSIS — M797 Fibromyalgia: Secondary | ICD-10-CM | POA: Diagnosis not present

## 2018-08-21 DIAGNOSIS — F17210 Nicotine dependence, cigarettes, uncomplicated: Secondary | ICD-10-CM | POA: Diagnosis present

## 2018-08-21 DIAGNOSIS — G35 Multiple sclerosis: Secondary | ICD-10-CM | POA: Diagnosis present

## 2018-08-21 DIAGNOSIS — Z955 Presence of coronary angioplasty implant and graft: Secondary | ICD-10-CM

## 2018-08-21 DIAGNOSIS — R0602 Shortness of breath: Secondary | ICD-10-CM | POA: Diagnosis not present

## 2018-08-21 DIAGNOSIS — R079 Chest pain, unspecified: Secondary | ICD-10-CM | POA: Diagnosis not present

## 2018-08-21 DIAGNOSIS — M109 Gout, unspecified: Secondary | ICD-10-CM | POA: Diagnosis present

## 2018-08-21 DIAGNOSIS — R0683 Snoring: Secondary | ICD-10-CM | POA: Diagnosis present

## 2018-08-21 LAB — APTT: aPTT: 69 seconds — ABNORMAL HIGH (ref 24–36)

## 2018-08-21 LAB — HEPATIC FUNCTION PANEL
ALT: 16 U/L (ref 0–44)
AST: 16 U/L (ref 15–41)
Albumin: 2.6 g/dL — ABNORMAL LOW (ref 3.5–5.0)
Alkaline Phosphatase: 55 U/L (ref 38–126)
Bilirubin, Direct: 0.1 mg/dL (ref 0.0–0.2)
Indirect Bilirubin: 0.5 mg/dL (ref 0.3–0.9)
Total Bilirubin: 0.6 mg/dL (ref 0.3–1.2)
Total Protein: 6.4 g/dL — ABNORMAL LOW (ref 6.5–8.1)

## 2018-08-21 LAB — HEMOGLOBIN A1C
Hgb A1c MFr Bld: 6.1 % — ABNORMAL HIGH (ref 4.8–5.6)
Mean Plasma Glucose: 128.37 mg/dL

## 2018-08-21 LAB — CBC
HCT: 50 % — ABNORMAL HIGH (ref 36.0–46.0)
Hemoglobin: 15.2 g/dL — ABNORMAL HIGH (ref 12.0–15.0)
MCH: 29.1 pg (ref 26.0–34.0)
MCHC: 30.4 g/dL (ref 30.0–36.0)
MCV: 95.6 fL (ref 80.0–100.0)
NRBC: 0 % (ref 0.0–0.2)
Platelets: 235 10*3/uL (ref 150–400)
RBC: 5.23 MIL/uL — ABNORMAL HIGH (ref 3.87–5.11)
RDW: 13.2 % (ref 11.5–15.5)
WBC: 8.2 10*3/uL (ref 4.0–10.5)

## 2018-08-21 LAB — BASIC METABOLIC PANEL
Anion gap: 10 (ref 5–15)
BUN: 8 mg/dL (ref 6–20)
CHLORIDE: 104 mmol/L (ref 98–111)
CO2: 25 mmol/L (ref 22–32)
Calcium: 9.2 mg/dL (ref 8.9–10.3)
Creatinine, Ser: 1.43 mg/dL — ABNORMAL HIGH (ref 0.44–1.00)
GFR calc Af Amer: 47 mL/min — ABNORMAL LOW (ref >60)
GFR calc non Af Amer: 40 mL/min — ABNORMAL LOW (ref >60)
Glucose, Bld: 98 mg/dL (ref 70–99)
Potassium: 4.1 mmol/L (ref 3.5–5.1)
Sodium: 139 mmol/L (ref 135–145)

## 2018-08-21 LAB — I-STAT TROPONIN, ED
TROPONIN I, POC: 0.22 ng/mL — AB (ref 0.00–0.08)
Troponin i, poc: 0.13 ng/mL (ref 0.00–0.08)

## 2018-08-21 LAB — MAGNESIUM: Magnesium: 1.6 mg/dL — ABNORMAL LOW (ref 1.7–2.4)

## 2018-08-21 LAB — LIPID PANEL
Cholesterol: 129 mg/dL (ref 0–200)
HDL: 42 mg/dL (ref >40)
LDL Cholesterol: 81 mg/dL (ref 0–99)
Total CHOL/HDL Ratio: 3.1 RATIO
Triglycerides: 30 mg/dL (ref <150)
VLDL: 6 mg/dL (ref 0–40)

## 2018-08-21 LAB — T4, FREE: Free T4: 0.88 ng/dL (ref 0.82–1.77)

## 2018-08-21 LAB — TSH: TSH: 1.673 u[IU]/mL (ref 0.350–4.500)

## 2018-08-21 LAB — TROPONIN I
TROPONIN I: 0.22 ng/mL — AB (ref <0.03)
Troponin I: 0.27 ng/mL (ref <0.03)

## 2018-08-21 MED ORDER — SODIUM CHLORIDE 0.9 % IV SOLN
INTRAVENOUS | Status: DC
  Administered 2018-08-21: 16:00:00 via INTRAVENOUS

## 2018-08-21 MED ORDER — SODIUM CHLORIDE 0.9 % IV SOLN: 250.0000 mL | INTRAVENOUS | Status: DC | PRN

## 2018-08-21 MED ORDER — ASPIRIN 81 MG PO CHEW
324.0000 mg | CHEWABLE_TABLET | Freq: Once | ORAL | Status: AC
  Administered 2018-08-21: 324 mg via ORAL
  Filled 2018-08-21: qty 4

## 2018-08-21 MED ORDER — DULOXETINE HCL 30 MG PO CPEP
30.0000 mg | ORAL_CAPSULE | Freq: Every day | ORAL | Status: DC
  Administered 2018-08-22 – 2018-08-23 (×2): 30 mg via ORAL
  Filled 2018-08-21 (×2): qty 1

## 2018-08-21 MED ORDER — SODIUM CHLORIDE 0.9% FLUSH: 3.0000 mL | INTRAVENOUS | Status: DC | PRN

## 2018-08-21 MED ORDER — NITROGLYCERIN IN D5W 200-5 MCG/ML-% IV SOLN
0.0000 ug/min | INTRAVENOUS | Status: DC
  Administered 2018-08-21: 5 ug/min via INTRAVENOUS
  Filled 2018-08-21: qty 250

## 2018-08-21 MED ORDER — SODIUM CHLORIDE 0.9% FLUSH: 3.0000 mL | Freq: Two times a day (BID) | INTRAVENOUS | Status: DC

## 2018-08-21 MED ORDER — ASPIRIN EC 81 MG PO TBEC
81.0000 mg | DELAYED_RELEASE_TABLET | Freq: Every day | ORAL | Status: DC
  Administered 2018-08-23: 10:00:00 81 mg via ORAL
  Filled 2018-08-21 (×2): qty 1

## 2018-08-21 MED ORDER — PREGABALIN 25 MG PO CAPS
25.0000 mg | ORAL_CAPSULE | Freq: Two times a day (BID) | ORAL | Status: DC
  Administered 2018-08-21 – 2018-08-23 (×3): 25 mg via ORAL
  Filled 2018-08-21 (×4): qty 1

## 2018-08-21 MED ORDER — ACETAMINOPHEN 325 MG PO TABS
650.0000 mg | ORAL_TABLET | ORAL | Status: DC | PRN
  Administered 2018-08-21 – 2018-08-22 (×2): 650 mg via ORAL
  Filled 2018-08-21 (×2): qty 2

## 2018-08-21 MED ORDER — DIMETHYL FUMARATE 240 MG PO CPDR
240.0000 mg | DELAYED_RELEASE_CAPSULE | Freq: Two times a day (BID) | ORAL | Status: DC
  Administered 2018-08-23: 10:00:00 240 mg via ORAL
  Filled 2018-08-21 (×3): qty 1

## 2018-08-21 MED ORDER — CYCLOBENZAPRINE HCL 10 MG PO TABS: 10.0000 mg | ORAL_TABLET | Freq: Two times a day (BID) | ORAL | Status: DC | PRN

## 2018-08-21 MED ORDER — SODIUM CHLORIDE 0.9 % WEIGHT BASED INFUSION: 1.0000 mL/kg/h | INTRAVENOUS | Status: DC

## 2018-08-21 MED ORDER — SODIUM CHLORIDE 0.9 % WEIGHT BASED INFUSION: 3.0000 mL/kg/h | INTRAVENOUS | Status: DC

## 2018-08-21 MED ORDER — SODIUM CHLORIDE 0.9 % IV SOLN
0.1500 mg/kg/h | INTRAVENOUS | Status: DC
  Administered 2018-08-21 – 2018-08-22 (×2): 0.15 mg/kg/h via INTRAVENOUS
  Filled 2018-08-21 (×2): qty 250

## 2018-08-21 MED ORDER — NICOTINE 14 MG/24HR TD PT24
14.0000 mg | MEDICATED_PATCH | Freq: Every day | TRANSDERMAL | Status: DC
  Filled 2018-08-21: qty 1

## 2018-08-21 MED ORDER — METOPROLOL TARTRATE 25 MG PO TABS
25.0000 mg | ORAL_TABLET | Freq: Two times a day (BID) | ORAL | Status: DC
  Administered 2018-08-21 – 2018-08-23 (×4): 25 mg via ORAL
  Filled 2018-08-21 (×4): qty 1

## 2018-08-21 MED ORDER — NITROGLYCERIN 0.4 MG SL SUBL: 0.4000 mg | SUBLINGUAL_TABLET | SUBLINGUAL | Status: DC | PRN

## 2018-08-21 MED ORDER — ASPIRIN 81 MG PO CHEW: 324.0000 mg | CHEWABLE_TABLET | ORAL | Status: DC

## 2018-08-21 MED ORDER — ATORVASTATIN CALCIUM 80 MG PO TABS
80.0000 mg | ORAL_TABLET | Freq: Every day | ORAL | Status: DC
  Administered 2018-08-21 – 2018-08-22 (×2): 80 mg via ORAL
  Filled 2018-08-21 (×2): qty 1

## 2018-08-21 MED ORDER — ASPIRIN 81 MG PO CHEW
81.0000 mg | CHEWABLE_TABLET | ORAL | Status: AC
  Administered 2018-08-22: 81 mg via ORAL
  Filled 2018-08-21: qty 1

## 2018-08-21 MED ORDER — ONDANSETRON HCL 4 MG/2ML IJ SOLN
4.0000 mg | Freq: Four times a day (QID) | INTRAMUSCULAR | Status: DC | PRN
  Administered 2018-08-22: 21:00:00 4 mg via INTRAVENOUS
  Filled 2018-08-21: qty 2

## 2018-08-21 MED ORDER — ASPIRIN 300 MG RE SUPP: 300.0000 mg | RECTAL | Status: DC

## 2018-08-21 NOTE — ED Notes (Signed)
Trop result 0.13 given to Charge RN [employer]

## 2018-08-21 NOTE — ED Provider Notes (Addendum)
[facility] EMERGENCY DEPARTMENT Provider Note   CSN: [id number] Arrival date & time: 08/21/18  1013     History   Chief Complaint Chief Complaint  Patient presents with  . Chest Pain   HPI Tiffany Brady is a 57 y.o. female CKD, TIIDM, HTN, and MS presenting with intermittent non-exertional central chest pain with associated nausea that began two days ago. She describes the pain as burning and is not occurring currently. She endorses right arm pain and denies left arm pain, numbness or tingling. The pain occurs at night and has occurred both before and after eating dinner. She states the pain was somewhat relieved with TUMs and endorses increased . After taking medication last night she vomited up her dinner, and did not see any black or bright red blood. She denies recent illness or sick contacts.   She endorses baseline SOB that is unchanged from usual. She denies upper back pain, changes in urination, bowel movement, or leg swelling. She has a history of fibromyalgia and has diffuse pain in her chest wall and legs that is different from the burning pain she felt for the past two nights.   She has smoked 1/2ppd for the past 43 years. She rarely drinks. She does not use drugs recreationally and has no family history of CVD or CAD.    HPI  Past Medical History:  Diagnosis Date  . Chronic kidney disease    pt reported kidney failure  . Chronic pain syndrome   . Gout   . Hypertension   . MS (multiple sclerosis) ([place])   . Tobacco use     Patient Active Problem List   Diagnosis Date Noted  . De Quervain's disease (tenosynovitis) 08/02/2018  . Body mass index 40.0-44.9, adult ([place]) 08/02/2018  . Stress incontinence 05/08/2017  . Cervical radiculopathy 05/08/2017  . CKD (chronic kidney disease) stage 3, GFR 30-59 ml/min (HCC) 04/04/2017  . Vitamin D deficiency 04/04/2017  . Controlled type 2 diabetes mellitus without complication, without long-term current  use of insulin ([place]) 04/04/2017  . Fibromyalgia 03/28/2016  . Multiple sclerosis ([place]) 11/02/2015  . Tobacco abuse 04/24/2014  . Back muscle spasm 01/28/2014  . Migraine headache 07/23/2013  . Essential hypertension 06/27/2013  . Chronic pain syndrome 06/27/2013  . Morbid obesity ([place]) 06/27/2013    Past Surgical History:  Procedure Laterality Date  . FOOT SURGERY Right      OB History   None      Home Medications    Prior to Admission medications   Medication Sig Start Date End Date Taking? Authorizing Provider  AMBULATORY NON FORMULARY MEDICATION Walker (4 prong) DX: G35 06/18/18   Patel, Arvin Collard K, DO  aspirin EC 81 MG tablet Take 1 tablet (81 mg total) by mouth daily. 04/04/17   Ladell Pier, MD  Blood Glucose Monitoring Suppl (TRUE METRIX GO GLUCOSE METER) w/Device KIT 1 each by Does not apply route every 8 (eight) hours as needed. 03/28/16   Maren Reamer, MD  cyclobenzaprine (FLEXERIL) 10 MG tablet Take 1 tablet (10 mg total) by mouth 2 (two) times daily as needed for muscle spasms. 07/23/18   Ladell Pier, MD  diclofenac sodium (VOLTAREN) 1 % GEL APPLY TWO GRAMS TOPICALLY FOUR TIMES A DAY 07/09/18   Ladell Pier, MD  Dimethyl Fumarate (TECFIDERA) 240 MG CPDR Take 1 capsule (240 mg total) by mouth 2 (two) times daily. 01/24/18   Patel, Arvin Collard K, DO  DULoxetine (CYMBALTA) 30 MG  capsule TAKE ONE CAPSULE BY MOUTH DAILY 06/18/18   Ladell Pier, MD  fluticasone [facility]) 50 MCG/ACT nasal spray PLACE ONE SPRAY INTO BOTH NOSTRILS DAILY AS NEEDED FOR ALLERGIES OR RHINITIS 05/02/18   Ladell Pier, MD  glucose blood (TRUE METRIX BLOOD GLUCOSE TEST) test strip Use as instructed 07/23/18   Ladell Pier, MD  losartan-hydrochlorothiazide (HYZAAR) 100-25 MG tablet TAKE ONE-HALF TABLET BY MOUTH DAILY 05/02/18   Ladell Pier, MD  nicotine (NICODERM CQ - DOSED IN MG/24 HOURS) 14 mg/24hr patch Place 1 patch (14 mg total) onto the skin daily. 03/28/18    Ladell Pier, MD  pregabalin (LYRICA) 25 MG capsule Take 1 capsule (25 mg total) by mouth 2 (two) times daily. 07/23/18   Ladell Pier, MD  TRUEPLUS LANCETS 26G MISC 1 each by Does not apply route every 8 (eight) hours as needed. 04/04/17   Ladell Pier, MD    Family History Family History  Problem Relation Age of Onset  . Cancer Mother        Deceased  . Hypertension Mother   . Other Father        Deceased, 45  . HIV/AIDS Brother        Deceased  . Multiple sclerosis Daughter   . Breast cancer Neg Hx     Social History Social History   Tobacco Use  . Smoking status: Current Every Day Smoker    Packs/day: 0.75    Years: 40.00    Pack years: 30.00    Types: Cigarettes  . Smokeless tobacco: Never Used  Substance Use Topics  . Alcohol use: Yes    Alcohol/week: 0.0 standard drinks    Comment: occasional  . Drug use: No     Allergies   Pork-derived products   Review of Systems All other review of systems negative except as noted in HPI.   Physical Exam Updated Vital Signs BP (!) 176/109   Pulse 75   Temp 98 F (36.7 C) (Oral)   Resp 18   Ht _0  (1.626 m)   Wt 120.2 kg   LMP 04/21/2014   SpO2 95%   BMI 45.49 kg/m   Physical Exam   ED Treatments / Results  Labs (all labs ordered are listed, but only abnormal results are displayed) Labs Reviewed  BASIC METABOLIC PANEL - Abnormal; Notable for the following components:      Result Value   Creatinine, Ser 1.43 (*)    GFR calc non Af Amer 40 (*)    GFR calc Af Amer 47 (*)    All other components within normal limits  CBC - Abnormal; Notable for the following components:   RBC 5.23 (*)    Hemoglobin 15.2 (*)    HCT 50.0 (*)    All other components within normal limits  I-STAT TROPONIN, ED - Abnormal; Notable for the following components:   Troponin i, poc 0.13 (*)    All other components within normal limits  I-STAT TROPONIN, ED - Abnormal; Notable for the following components:    Troponin i, poc 0.22 (*)    All other components within normal limits    EKG EKG Interpretation  Date/Time:  Tuesday August 21 2018 11:27:30 EST Ventricular Rate:  79 PR Interval:  152 QRS Duration: 72 QT Interval:  378 QTC Calculation: 433 R Axis:   38 Text Interpretation:  Normal sinus rhythm Right atrial enlargement Cannot rule out Anterior infarct , age undetermined Abnormal ECG  rate slower, borderline T wave flattening compared to previous, no ST elevation Confirmed by Theotis Burrow [id number]) on 08/21/2018 11:14:17 AM   Radiology Dg Chest 2 View  Result Date: 08/21/2018 CLINICAL DATA:  Shortness of breath, chest tightness. Right arm pain EXAM: CHEST - 2 VIEW COMPARISON:  12/04/2015 FINDINGS: Mild peribronchial thickening. Heart is normal size. Minimal bibasilar atelectasis. No effusions or acute bony abnormality. IMPRESSION: Bronchitic changes, bibasilar atelectasis. Electronically Signed   By: Rolm Baptise M.D.   On: 08/21/2018 11:08    Procedures Procedures (including critical care time)  Medications Ordered in ED Medications  aspirin chewable tablet 324 mg (324 mg Oral Given 08/21/18 1233)     Initial Impression / Assessment and Plan / ED Course  I have reviewed the triage vital signs and the nursing notes.  Pertinent labs & imaging results that were available during my care of the patient were reviewed by me and considered in my medical decision making (see chart for details).    57yo female with PMH HTN, MS, TIIDM, and CKD presenting with two days of chest pain, nausea, vomiting, found to have positive troponin. EKG did not show acute STEMI. Chest pain currently resolved. Unable to start heparin or lovenox as she has allergy to pork-derived products, discussed with pharmacy and will defer to cardiology. Given asa 325 mg. Repeat troponin increased from .13 >> .22. Cardiology consulted for admission.    Final Clinical Impressions(s) / ED Diagnoses   Final  diagnoses:  NSTEMI (non-ST elevated myocardial infarction) [facility])    ED Discharge Orders    None       Seawell, Jaimie A, DO 08/21/18 1424    Seawell, Jaimie A, DO 08/21/18 1506    Little, Wenda Overland, MD 08/22/18 [phone]

## 2018-08-21 NOTE — H&P (Addendum)
The patient has been seen in conjunction with Cecilie Kicks, NP-C. All aspects of care have been considered and discussed. The patient has been personally interviewed, examined, and all clinical data has been reviewed.   The patient has multiple risk factors including tobacco use, hyperlipidemia, untreated diabetes mellitus, hypertension, and and presence of elevated cardiac markers has a clinical diagnosis of acute coronary syndrome/non-ST elevation myocardial infarction.  She is allergic to heparin and therefore Aggrastat for ACS will be started.  She will continue on aspirin.  We have recommended coronary angiography.  Will need to use bivalirudin during coronary angiography.  Scheduled for 4 PM tomorrow afternoon. The patient was counseled to undergo left heart catheterization, coronary angiography, and possible percutaneous coronary intervention with stent implantation. The procedural risks and benefits were discussed in detail. The risks discussed included death, stroke, myocardial infarction, life-threatening bleeding, limb ischemia, kidney injury, allergy, and possible emergency cardiac surgery. The risk of these significant complications were estimated to occur less than 1% of the time. After discussion, the patient has agreed to proceed.     Cardiology Admission History and Physical:   Patient ID: Tiffany Brady MRN: [id number]; DOB: [birth year]   Admission date: 08/21/2018  Primary Care Provider: Ladell Pier, MD Primary Cardiologist: Sinclair Grooms, MD NEW Primary Electrophysiologist:  None   Chief Complaint:  Chest pain   Patient Profile:   Tiffany Brady is a 57 y.o. female with hx of DM-2, HTN, tobacco use, obesity , fibromyalgia, CKD-3 and MS now presents with chest pain.     History of Present Illness:   Tiffany Brady with hx of DM-2, HTN, tobacco use, obesity, fibromyalgia, CKD-3 and MS presented to ER today with chest pain since Sunday.  She had one  episode at rest on Sunday with vomiting.  Pain a pressure in chest and to Rt shoulder.  No SOB, no diaphoresis.  Took PPI and eventually pain resolved.  Then last pm another episode no vomiting. eventually resolved.  Today she was worried it would return.  She has mild discomfort not.  Usually with her MS she uses her cane to ambulate but is able to do ADLs and housekeeping.       Previous echo 04/03/14 with EF 60-65%, no RWMA G1DD    Troponin 0.13 and troponin 0.22  EKG SR no acute changes Na 139 K+ 4.1 Cr 1.43  hgb 15.2 and plts 235.  2 V CXR Bronchitic changes, bibasilar atelectasis  Has rec'd ASA 324 mg here in ER  BP 141/98 to 176/109 with continued mild chest discomfort.  + snoring and periods of apnea per pt   Past Medical History:  Diagnosis Date  . Chronic kidney disease    pt reported kidney failure  . Chronic pain syndrome   . Gout   . Hypertension   . MS (multiple sclerosis) ([place])   . Tobacco use     Past Surgical History:  Procedure Laterality Date  . FOOT SURGERY Right      Medications Prior to Admission: Prior to Admission medications   Medication Sig Start Date End Date Taking? Authorizing Provider  AMBULATORY NON FORMULARY MEDICATION Walker (4 prong) DX: G35 06/18/18   Patel, Arvin Collard K, DO  aspirin EC 81 MG tablet Take 1 tablet (81 mg total) by mouth daily. 04/04/17   Ladell Pier, MD  Blood Glucose Monitoring Suppl (TRUE METRIX GO GLUCOSE METER) w/Device KIT 1 each by Does not apply route every 8 (eight) hours as  needed. 03/28/16   Maren Reamer, MD  cyclobenzaprine (FLEXERIL) 10 MG tablet Take 1 tablet (10 mg total) by mouth 2 (two) times daily as needed for muscle spasms. 07/23/18   Ladell Pier, MD  diclofenac sodium (VOLTAREN) 1 % GEL APPLY TWO GRAMS TOPICALLY FOUR TIMES A DAY 07/09/18   Ladell Pier, MD  Dimethyl Fumarate (TECFIDERA) 240 MG CPDR Take 1 capsule (240 mg total) by mouth 2 (two) times daily. 01/24/18   Narda Amber K, DO    DULoxetine (CYMBALTA) 30 MG capsule TAKE ONE CAPSULE BY MOUTH DAILY 06/18/18   Ladell Pier, MD  fluticasone [facility]) 50 MCG/ACT nasal spray PLACE ONE SPRAY INTO BOTH NOSTRILS DAILY AS NEEDED FOR ALLERGIES OR RHINITIS 05/02/18   Ladell Pier, MD  glucose blood (TRUE METRIX BLOOD GLUCOSE TEST) test strip Use as instructed 07/23/18   Ladell Pier, MD  losartan-hydrochlorothiazide (HYZAAR) 100-25 MG tablet TAKE ONE-HALF TABLET BY MOUTH DAILY 05/02/18   Ladell Pier, MD  nicotine (NICODERM CQ - DOSED IN MG/24 HOURS) 14 mg/24hr patch Place 1 patch (14 mg total) onto the skin daily. 03/28/18   Ladell Pier, MD  pregabalin (LYRICA) 25 MG capsule Take 1 capsule (25 mg total) by mouth 2 (two) times daily. 07/23/18   Ladell Pier, MD  TRUEPLUS LANCETS 26G MISC 1 each by Does not apply route every 8 (eight) hours as needed. 04/04/17   Ladell Pier, MD     Allergies:    Allergies  Allergen Reactions  . Pork-Derived Metallurgist    Social History:   Social History   Socioeconomic History  . Marital status: Widowed    Spouse name: Not on file  . Number of children: Not on file  . Years of education: Not on file  . Highest education level: Not on file  Occupational History  . Not on file  Social Needs  . Financial resource strain: Not on file  . Food insecurity:    Worry: Not on file    Inability: Not on file  . Transportation needs:    Medical: Not on file    Non-medical: Not on file  Tobacco Use  . Smoking status: Current Every Day Smoker    Packs/day: 0.75    Years: 40.00    Pack years: 30.00    Types: Cigarettes  . Smokeless tobacco: Never Used  Substance and Sexual Activity  . Alcohol use: Yes    Alcohol/week: 0.0 standard drinks    Comment: occasional  . Drug use: No  . Sexual activity: Not on file  Lifestyle  . Physical activity:    Days per week: Not on file    Minutes per session: Not on file  . Stress: Not on file  Relationships   . Social connections:    Talks on phone: Not on file    Gets together: Not on file    Attends religious service: Not on file    Active member of club or organization: Not on file    Attends meetings of clubs or organizations: Not on file    Relationship status: Not on file  . Intimate partner violence:    Fear of current or ex partner: Not on file    Emotionally abused: Not on file    Physically abused: Not on file    Forced sexual activity: Not on file  Other Topics Concern  . Not on file  Social History Narrative   Lives with  niece and her 3 children in a 2 story home.     Only goes upstairs once a day.  Has 1 child.  Does not work.  Trying to get disability.  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     Education: 10th grade.    Family History:   The patient's family history includes Cancer in her mother; HIV/AIDS in her brother; Hypertension in her mother; Multiple sclerosis in her daughter; Other in her father. There is no history of Breast cancer.   Father died of old age at 46-- no history of CAD  ROS:  Please see the history of present illness.  General:no colds or fevers, no weight changes Skin:no rashes or ulcers HEENT:no blurred vision, no congestion CV:see HPI PUL:see HPI GI:no diarrhea constipation or melena, no indigestion GU:no hematuria, no dysuria MS:no joint pain, no claudication, + MS walks with a cane Neuro:no syncope, occ lightheadedness  was on medication  Endo:+ diabetes diet controlled but not sure she has been able to stick to diet., no thyroid disease All other ROS reviewed and negative.     Physical Exam/Data:   Vitals:   08/21/18 1145 08/21/18 1200 08/21/18 1300 08/21/18 1315  BP: (!) 142/109 (!) 146/86 (!) 178/109 (!) 176/109  Pulse: 69 71 71 75  Resp: _0 Temp:      TempSrc:      SpO2: 97% 98% 95% 95%  Weight:      Height:       No intake or output data in the 24 hours ending 08/21/18 1400 Filed Weights    08/21/18 1122  Weight: 120.2 kg   Body mass index is 45.49 kg/m.  General:  Well nourished, well developed, in no acute distress, continues with mild chest discomfort HEENT: normal Lymph: no adenopathy Neck: no JVD Endocrine:  No thryomegaly Vascular: No carotid bruits; pedal pulses 2+ bilaterally   Cardiac:  normal S1, S2; RRR; no murmur, gallup rub or click Lungs:  clear to auscultation bilaterally, no wheezing, rhonchi or rales  Abd: soft, nontender, no hepatomegaly  Ext: no lower ext edema Musculoskeletal:  No deformities, BUE and BLE strength normal and equal Skin: warm and dry  Neuro:  Alert and oriented X 3 MAE follows commands, no focal abnormalities noted Psych:  Normal affect     Relevant CV Studies: Echo 04/03/18 Study Conclusions  - Left ventricle: The cavity size was normal. There was mild concentric hypertrophy. Systolic function was normal. The estimated ejection fraction was in the range of 60% to 65%. Wall motion was normal; there were no regional wall motion abnormalities. There was an increased relative contribution of atrial contraction to ventricular filling. Doppler parameters are consistent with abnormal left ventricular relaxation (grade 1 diastolic dysfunction).   Renal artery duplex  2014 Summary: Findings suggest 1-59% renal artery stenosis bilaterally. There is evidence of mildly elevated left intrarenal resistive indices.  Laboratory Data:  Chemistry Recent Labs  Lab 08/21/18 1035  NA 139  K 4.1  CL 104  CO2 25  GLUCOSE 98  BUN 8  CREATININE 1.43*  CALCIUM 9.2  GFRNONAA 40*  GFRAA 47*  ANIONGAP 10    No results for input(s): PROT, ALBUMIN, AST, ALT, ALKPHOS, BILITOT in the last 168 hours. Hematology Recent Labs  Lab 08/21/18 1035  WBC 8.2  RBC 5.23*  HGB 15.2*  HCT 50.0*  MCV 95.6  MCH 29.1  MCHC 30.4  RDW 13.2  PLT 235  Cardiac EnzymesNo results for input(s): TROPONINI in the last 168 hours.    Recent Labs  Lab 08/21/18 1048 08/21/18 1340  TROPIPOC 0.13* 0.22*    BNPNo results for input(s): BNP, PROBNP in the last 168 hours.  DDimer No results for input(s): DDIMER in the last 168 hours.  Radiology/Studies:  Dg Chest 2 View  Result Date: 08/21/2018 CLINICAL DATA:  Shortness of breath, chest tightness. Right arm pain EXAM: CHEST - 2 VIEW COMPARISON:  12/04/2015 FINDINGS: Mild peribronchial thickening. Heart is normal size. Minimal bibasilar atelectasis. No effusions or acute bony abnormality. IMPRESSION: Bronchitic changes, bibasilar atelectasis. Electronically Signed   By: Rolm Baptise M.D.   On: 08/21/2018 11:08    Assessment and Plan:   1. NSTEMI with elevated Troponin poc of 0.13 and 0.22.  Pain 1st episode Sunday night another episode last pm.  Now with mild mid to right chest discomfort but nothing as severe as other episodes.  Allergic to pork and heparin is derived from pork   -  May need bivalirudin instead, will defer to Dr. Tamala Julian who will see the pt. Yes will use aggrastat.  With ongoing chest discomfort and elevated BP will add IV NTG to control both.   Admit to progressive bed with tele.  Statin may be contraindicated with MS -will have pharmacy add input.  Will need cardiac cath to further eval. 2. HTN currently uncontrolled.  Will add IV NTG on outpt hyzaar- hold for cath, will add BB 3. Tobacco use continue nicoderm  4. MS continue outpt meds.  5.   probable sleep apnea will need outpt study  Severity of Illness: The appropriate patient status for this patient is INPATIENT. Inpatient status is judged to be reasonable and necessary in order to provide the required intensity of service to ensure the patient's safety. The patient's presenting symptoms, physical exam findings, and initial radiographic and laboratory data in the context of their chronic comorbidities is felt to place them at high risk for further clinical deterioration. Furthermore, it is not anticipated  that the patient will be medically stable for discharge from the hospital within 2 midnights of admission. The following factors support the patient status of inpatient.   " The patient's presenting symptoms include chest pain with vomitin. " The worrisome physical exam findings include chest pain. " The initial radiographic and laboratory data are worrisome because of elevated troponin for NSTEMI. " The chronic co-morbidities include .   * I certify that at the point of admission it is my clinical judgment that the patient will require inpatient hospital care spanning beyond 2 midnights from the point of admission due to high intensity of service, high risk for further deterioration and high frequency of surveillance required.*    For questions or updates, please contact [place] Please consult [email or web address] for contact info under        Signed, Cecilie Kicks, NP  08/21/2018 2:00 PM

## 2018-08-21 NOTE — Progress Notes (Signed)
ANTICOAGULATION CONSULT NOTE - follow up [place] for bivalrudin Indication: chest pain/ACS   Patient Measurements: Height: 5\' 4"  (162.6 cm) Weight: 261 lb 1.6 oz (118.4 kg) IBW/kg (Calculated) : 54.7   Vital Signs: Temp: 97.7 F (36.5 C) (12/10 2105) Temp Source: Oral (12/10 2105) BP: 128/88 (12/10 2105) Pulse Rate: 63 (12/10 2105)  Labs: Recent Labs    08/21/18 1035 08/21/18 1557 08/21/18 2035  HGB 15.2*  --   --   HCT 50.0*  --   --   PLT 235  --   --   APTT  --   --  69*  CREATININE 1.43*  --   --   TROPONINI  --  0.27*  --      Medical History: Past Medical History:  Diagnosis Date  . Chronic kidney disease    pt reported kidney failure  . Chronic pain syndrome   . Gout   . Hypertension   . MS (multiple sclerosis) ([place])   . Tobacco use      Assessment: 57 yo female with NSTEMI. Pork products allergy - will place on bivalirudin. CBC stable. SCr 1.4, eCrCl ~ 50 ml/min.  bivalirudan 0.15 mg/kg/hr aptt 69sec at goal    Goal of Therapy:  aPTT 50-85 seconds Monitor platelets by anticoagulation protocol: Yes   Plan:  -continue Bivalirudin 0.15 mg/kg/hr -aptt and cbc daily    Bonnita Nasuti Pharm.D. CPP, BCPS Clinical Pharmacist [phone] 08/21/2018 9:44 PM

## 2018-08-21 NOTE — ED Triage Notes (Signed)
Pt in c/o chest pain with vomiting that started on Sunday, reports some shortness of breath, pain is central, vomiting started last night

## 2018-08-21 NOTE — Progress Notes (Signed)
ANTICOAGULATION CONSULT NOTE - Initial Consult  Pharmacy Consult for bivalrudin Indication: chest pain/ACS   Patient Measurements: Height: 5\' 4"  (162.6 cm) Weight: 265 lb (120.2 kg) IBW/kg (Calculated) : 54.7   Vital Signs: Temp: 98 F (36.7 C) (12/10 1029) Temp Source: Oral (12/10 1029) BP: 176/109 (12/10 1315) Pulse Rate: 75 (12/10 1315)  Labs: Recent Labs    08/21/18 1035  HGB 15.2*  HCT 50.0*  PLT 235  CREATININE 1.43*     Medical History: Past Medical History:  Diagnosis Date  . Chronic kidney disease    pt reported kidney failure  . Chronic pain syndrome   . Gout   . Hypertension   . MS (multiple sclerosis) ([place])   . Tobacco use      Assessment: 57 yo female with NSTEMI. Pork products allergy - will place on bivalirudin. CBC stable. SCr 1.4, eCrCl ~ 50 ml/min.  Discussed statin use in MS patients with provider.    Goal of Therapy:  aPTT 50-85 seconds Monitor platelets by anticoagulation protocol: Yes   Plan:  -Bivalirudin 0.15 mg/kg/hr -aptt q2hr until two therapeutic levels -daily CBC    Jaasia Viglione, Jake Church 08/21/2018,3:23 PM

## 2018-08-22 ENCOUNTER — Other Ambulatory Visit (HOSPITAL_COMMUNITY): Payer: Medicare Other

## 2018-08-22 ENCOUNTER — Inpatient Hospital Stay (HOSPITAL_COMMUNITY): Payer: Medicare Other

## 2018-08-22 ENCOUNTER — Inpatient Hospital Stay (HOSPITAL_COMMUNITY)
Admission: EM | Disposition: A | Discharge status: Home / Self Care | Payer: Self-pay | Source: Home / Self Care | Attending: Interventional Cardiology

## 2018-08-22 DIAGNOSIS — I214 Non-ST elevation (NSTEMI) myocardial infarction: Secondary | ICD-10-CM

## 2018-08-22 DIAGNOSIS — I2510 Atherosclerotic heart disease of native coronary artery without angina pectoris: Secondary | ICD-10-CM

## 2018-08-22 HISTORY — PX: LEFT HEART CATH AND CORONARY ANGIOGRAPHY: CATH118249

## 2018-08-22 LAB — CBC
HCT: 44.4 % (ref 36.0–46.0)
Hemoglobin: 14.3 g/dL (ref 12.0–15.0)
MCH: 29.7 pg (ref 26.0–34.0)
MCHC: 32.2 g/dL (ref 30.0–36.0)
MCV: 92.1 fL (ref 80.0–100.0)
Platelets: 195 10*3/uL (ref 150–400)
RBC: 4.82 MIL/uL (ref 3.87–5.11)
RDW: 13.2 % (ref 11.5–15.5)
WBC: 7.9 10*3/uL (ref 4.0–10.5)
nRBC: 0 % (ref 0.0–0.2)

## 2018-08-22 LAB — BASIC METABOLIC PANEL
Anion gap: 6 (ref 5–15)
BUN: 8 mg/dL (ref 6–20)
CO2: 27 mmol/L (ref 22–32)
Calcium: 8.9 mg/dL (ref 8.9–10.3)
Chloride: 106 mmol/L (ref 98–111)
Creatinine, Ser: 1.38 mg/dL — ABNORMAL HIGH (ref 0.44–1.00)
GFR calc non Af Amer: 42 mL/min — ABNORMAL LOW (ref >60)
GFR, EST AFRICAN AMERICAN: 49 mL/min — AB (ref >60)
GLUCOSE: 117 mg/dL — AB (ref 70–99)
Potassium: 3.7 mmol/L (ref 3.5–5.1)
Sodium: 139 mmol/L (ref 135–145)

## 2018-08-22 LAB — GLUCOSE, CAPILLARY
GLUCOSE-CAPILLARY: 122 mg/dL — AB (ref 70–99)
Glucose-Capillary: 99 mg/dL (ref 70–99)
Glucose-Capillary: 102 mg/dL — ABNORMAL HIGH (ref 70–99)
Glucose-Capillary: 108 mg/dL — ABNORMAL HIGH (ref 70–99)
Glucose-Capillary: 113 mg/dL — ABNORMAL HIGH (ref 70–99)
Glucose-Capillary: 107 mg/dL — ABNORMAL HIGH (ref 70–99)

## 2018-08-22 LAB — APTT: aPTT: 61 seconds — ABNORMAL HIGH (ref 24–36)

## 2018-08-22 LAB — PROTIME-INR
INR: 1.46
Prothrombin Time: 17.6 seconds — ABNORMAL HIGH (ref 11.4–15.2)

## 2018-08-22 LAB — ECHOCARDIOGRAM COMPLETE
Height: 64 in
Weight: 4212.8 oz

## 2018-08-22 LAB — HCG, SERUM, QUALITATIVE: Preg, Serum: NEGATIVE

## 2018-08-22 LAB — POCT ACTIVATED CLOTTING TIME: Activated Clotting Time: 312 seconds

## 2018-08-22 LAB — TROPONIN I: TROPONIN I: 0.2 ng/mL — AB (ref <0.03)

## 2018-08-22 LAB — HIV ANTIBODY (ROUTINE TESTING W REFLEX): HIV Screen 4th Generation wRfx: NONREACTIVE

## 2018-08-22 SURGERY — LEFT HEART CATH AND CORONARY ANGIOGRAPHY
Anesthesia: LOCAL

## 2018-08-22 MED ORDER — HEPARIN (PORCINE) IN NACL 1000-0.9 UT/500ML-% IV SOLN
INTRAVENOUS | Status: AC
  Filled 2018-08-22: qty 1000

## 2018-08-22 MED ORDER — FENTANYL CITRATE (PF) 100 MCG/2ML IJ SOLN
INTRAMUSCULAR | Status: DC | PRN
  Administered 2018-08-22 (×2): 25 ug via INTRAVENOUS

## 2018-08-22 MED ORDER — LABETALOL HCL 5 MG/ML IV SOLN: 10.0000 mg | INTRAVENOUS | Status: AC | PRN

## 2018-08-22 MED ORDER — SODIUM CHLORIDE 0.9 % IV SOLN
INTRAVENOUS | Status: DC | PRN
  Administered 2018-08-22: 1.75 mg/kg/h via INTRAVENOUS

## 2018-08-22 MED ORDER — HYDRALAZINE HCL 20 MG/ML IJ SOLN
5.0000 mg | INTRAMUSCULAR | Status: AC | PRN
  Administered 2018-08-22: 5 mg via INTRAVENOUS
  Filled 2018-08-22: qty 1

## 2018-08-22 MED ORDER — MAGNESIUM SULFATE 2 GM/50ML IV SOLN
2.0000 g | Freq: Once | INTRAVENOUS | Status: AC
  Administered 2018-08-22: 2 g via INTRAVENOUS
  Filled 2018-08-22: qty 50

## 2018-08-22 MED ORDER — LABETALOL HCL 5 MG/ML IV SOLN
INTRAVENOUS | Status: DC | PRN
  Administered 2018-08-22: 20 mg via INTRAVENOUS

## 2018-08-22 MED ORDER — SODIUM CHLORIDE 0.9 % WEIGHT BASED INFUSION: 1.0000 mL/kg/h | INTRAVENOUS | Status: AC

## 2018-08-22 MED ORDER — BIVALIRUDIN TRIFLUOROACETATE 250 MG IV SOLR
INTRAVENOUS | Status: AC
  Filled 2018-08-22: qty 250

## 2018-08-22 MED ORDER — SODIUM CHLORIDE 0.9% FLUSH: 3.0000 mL | INTRAVENOUS | Status: DC | PRN

## 2018-08-22 MED ORDER — BIVALIRUDIN BOLUS VIA INFUSION - CUPID
INTRAVENOUS | Status: DC | PRN
  Administered 2018-08-22: 89.55 mg via INTRAVENOUS

## 2018-08-22 MED ORDER — ALPRAZOLAM 0.5 MG PO TABS
0.5000 mg | ORAL_TABLET | Freq: Three times a day (TID) | ORAL | Status: DC | PRN
  Administered 2018-08-22: 0.5 mg via ORAL
  Filled 2018-08-22: qty 1

## 2018-08-22 MED ORDER — FENTANYL CITRATE (PF) 100 MCG/2ML IJ SOLN
INTRAMUSCULAR | Status: AC
  Filled 2018-08-22: qty 2

## 2018-08-22 MED ORDER — MIDAZOLAM HCL 2 MG/2ML IJ SOLN
INTRAMUSCULAR | Status: DC | PRN
  Administered 2018-08-22 (×2): 1 mg via INTRAVENOUS

## 2018-08-22 MED ORDER — SODIUM CHLORIDE 0.9 % IV SOLN: 250.0000 mL | INTRAVENOUS | Status: DC | PRN

## 2018-08-22 MED ORDER — LABETALOL HCL 5 MG/ML IV SOLN
INTRAVENOUS | Status: AC
  Filled 2018-08-22: qty 4

## 2018-08-22 MED ORDER — ANGIOPLASTY BOOK
Freq: Once | Status: AC
  Administered 2018-08-22: 1
  Filled 2018-08-22: qty 1

## 2018-08-22 MED ORDER — MIDAZOLAM HCL 2 MG/2ML IJ SOLN
INTRAMUSCULAR | Status: AC
  Filled 2018-08-22: qty 2

## 2018-08-22 MED ORDER — THE SENSUOUS HEART BOOK
Freq: Once | Status: AC
  Administered 2018-08-22: 22:00:00 1
  Filled 2018-08-22: qty 1

## 2018-08-22 MED ORDER — TICAGRELOR 90 MG PO TABS
ORAL_TABLET | ORAL | Status: AC
  Filled 2018-08-22: qty 2

## 2018-08-22 MED ORDER — LIDOCAINE HCL (PF) 1 % IJ SOLN
INTRAMUSCULAR | Status: DC | PRN
  Administered 2018-08-22: 2 mL

## 2018-08-22 MED ORDER — TICAGRELOR 90 MG PO TABS
ORAL_TABLET | ORAL | Status: DC | PRN
  Administered 2018-08-22: 180 mg via ORAL

## 2018-08-22 MED ORDER — HEART ATTACK BOUNCING BOOK
Freq: Once | Status: AC
  Administered 2018-08-22: 1
  Filled 2018-08-22: qty 1

## 2018-08-22 MED ORDER — VERAPAMIL HCL 2.5 MG/ML IV SOLN
INTRAVENOUS | Status: AC
  Filled 2018-08-22: qty 2

## 2018-08-22 MED ORDER — TICAGRELOR 90 MG PO TABS
90.0000 mg | ORAL_TABLET | Freq: Two times a day (BID) | ORAL | Status: DC
  Administered 2018-08-23: 05:00:00 90 mg via ORAL
  Filled 2018-08-22: qty 1

## 2018-08-22 MED ORDER — IOHEXOL 350 MG/ML SOLN
INTRAVENOUS | Status: DC | PRN
  Administered 2018-08-22: 130 mL via INTRACARDIAC

## 2018-08-22 MED ORDER — NITROGLYCERIN 1 MG/10 ML FOR IR/CATH LAB
INTRA_ARTERIAL | Status: AC
  Filled 2018-08-22: qty 10

## 2018-08-22 MED ORDER — VERAPAMIL HCL 2.5 MG/ML IV SOLN
INTRAVENOUS | Status: DC | PRN
  Administered 2018-08-22: 10 mL via INTRA_ARTERIAL

## 2018-08-22 MED ORDER — SODIUM CHLORIDE 0.9% FLUSH
3.0000 mL | Freq: Two times a day (BID) | INTRAVENOUS | Status: DC
  Administered 2018-08-23: 10:00:00 3 mL via INTRAVENOUS

## 2018-08-22 MED ORDER — NITROGLYCERIN 1 MG/10 ML FOR IR/CATH LAB
INTRA_ARTERIAL | Status: DC | PRN
  Administered 2018-08-22: 200 ug

## 2018-08-22 MED ORDER — LIDOCAINE HCL (PF) 1 % IJ SOLN
INTRAMUSCULAR | Status: AC
  Filled 2018-08-22: qty 30

## 2018-08-22 SURGICAL SUPPLY — 18 items
BALLN SAPPHIRE 2.5X15 (BALLOONS) ×2
BALLN SAPPHIRE ~~LOC~~ 3.0X10 (BALLOONS) ×2 IMPLANT
BALLOON SAPPHIRE 2.5X15 (BALLOONS) ×1 IMPLANT
CATH 5FR JL3.5 JR4 ANG PIG MP (CATHETERS) ×2 IMPLANT
CATH LAUNCHER 5F EBU3.5 (CATHETERS) ×2 IMPLANT
CATH LAUNCHER 6FR EBU3.5 (CATHETERS) ×2 IMPLANT
DEVICE RAD COMP TR BAND LRG (VASCULAR PRODUCTS) ×2 IMPLANT
GLIDESHEATH SLEND SS 6F .021 (SHEATH) ×2 IMPLANT
GUIDEWIRE INQWIRE 1.5J.035X260 (WIRE) ×1 IMPLANT
INQWIRE 1.5J .035X260CM (WIRE) ×2
KIT ENCORE 26 ADVANTAGE (KITS) ×2 IMPLANT
KIT HEART LEFT (KITS) ×2 IMPLANT
KIT HEMO VALVE WATCHDOG (MISCELLANEOUS) ×2 IMPLANT
PACK CARDIAC CATHETERIZATION (CUSTOM PROCEDURE TRAY) ×2 IMPLANT
STENT SYNERGY DES 2.75X16 (Permanent Stent) ×2 IMPLANT
TRANSDUCER W/STOPCOCK (MISCELLANEOUS) ×2 IMPLANT
TUBING CIL FLEX 10 FLL-RA (TUBING) ×2 IMPLANT
WIRE COUGAR XT STRL 190CM (WIRE) ×2 IMPLANT

## 2018-08-22 NOTE — Progress Notes (Signed)
ANTICOAGULATION CONSULT NOTE - follow up [place] for bivalrudin Indication: chest pain/ACS   Patient Measurements: Height: 5\' 4"  (162.6 cm) Weight: 263 lb 4.8 oz (119.4 kg) IBW/kg (Calculated) : 54.7   Vital Signs: Temp: 98 F (36.7 C) (12/11 0624) Temp Source: Oral (12/11 0624) BP: 154/98 (12/11 0624) Pulse Rate: 72 (12/11 0624)  Labs: Recent Labs    08/21/18 1035 08/21/18 1557 08/21/18 2035 08/22/18 0225  HGB 15.2*  --   --  14.3  HCT 50.0*  --   --  44.4  PLT 235  --   --  195  APTT  --   --  69* 61*  LABPROT  --   --   --  17.6*  INR  --   --   --  1.46  CREATININE 1.43*  --   --  1.38*  TROPONINI  --  0.27* 0.22* 0.20*     Medical History: Past Medical History:  Diagnosis Date  . Chronic kidney disease    pt reported kidney failure  . Chronic pain syndrome   . Gout   . Hypertension   . MS (multiple sclerosis) ([place])   . Tobacco use      Assessment: 33 yoF admitted with NSTEMI and started on bivalirudin due to pork allergy. Morning aPTT remains at goal at 61 seconds, CBC wnl and stable.   Goal of Therapy:  aPTT 50-85 seconds Monitor platelets by anticoagulation protocol: Yes   Plan:  -Continue bivalirudin 0.15 mg/kg/h -Daily aPTT and CBC   Arrie Senate, PharmD, BCPS Clinical Pharmacist [phone] Please check AMION for all [place] numbers 08/22/2018

## 2018-08-22 NOTE — Interval H&P Note (Signed)
History and Physical Interval Note:  08/22/2018 4:03 PM  Tiffany Brady  has presented today for surgery, with the diagnosis of ns  The various methods of treatment have been discussed with the patient and family. After consideration of risks, benefits and other options for treatment, the patient has consented to  Procedure(s): LEFT HEART CATH AND CORONARY ANGIOGRAPHY (N/A) as a surgical intervention .  The patient's history has been reviewed, patient examined, no change in status, stable for surgery.  I have reviewed the patient's chart and labs.  Questions were answered to the patient's satisfaction.     Sherren Mocha

## 2018-08-22 NOTE — Progress Notes (Signed)
Called by RN, pt very anxious and did not want to wear nicoderm patch but will add xanax to meds for now.  Her BP is elevated as well and is being treated.

## 2018-08-22 NOTE — Progress Notes (Addendum)
The patient has been seen in conjunction with Vin Bhagat, PA-C. All aspects of care have been considered and discussed. The patient has been personally interviewed, examined, and all clinical data has been reviewed.   Awaiting cath.  Discharge plans will be dependent upon findings.  Low persistent elevation in troponin in the setting of significant coronary cardiovascular risk factors.  Progress Note  Patient Name: Tiffany Brady Date of Encounter: 08/22/2018  Primary Cardiologist: Sinclair Grooms, MD   Subjective   Feeling well. No chest pain, sob or palpitations.   Inpatient Medications    Scheduled Meds: . aspirin EC  81 mg Oral Daily  . atorvastatin  80 mg Oral q1800  . Dimethyl Fumarate  240 mg Oral BID  . DULoxetine  30 mg Oral Daily  . metoprolol tartrate  25 mg Oral BID  . nicotine  14 mg Transdermal Daily  . pregabalin  25 mg Oral BID  . sodium chloride flush  3 mL Intravenous Q12H   Continuous Infusions: . sodium chloride 10 mL/hr at 08/21/18 1829  . sodium chloride    . sodium chloride 1 mL/kg/hr (08/22/18 0503)  . bivalirudin (ANGIOMAX) infusion 0.5 mg/mL (Non-ACS indications) 0.15 mg/kg/hr (08/22/18 0741)  . magnesium sulfate 1 - 4 g bolus IVPB    . nitroGLYCERIN 5 mcg/min (08/21/18 1810)   PRN Meds: sodium chloride, acetaminophen, cyclobenzaprine, nitroGLYCERIN, ondansetron (ZOFRAN) IV, sodium chloride flush   Vital Signs    Vitals:   08/21/18 1630 08/21/18 2105 08/21/18 2320 08/22/18 0624  BP:  128/88 (!) 158/88 (!) 154/98  Pulse:  63  72  Resp: 17 18  18   Temp:  97.7 F (36.5 C)  98 F (36.7 C)  TempSrc: Oral Oral  Oral  SpO2: 100% 98%  93%  Weight: 118.4 kg   119.4 kg  Height: 5\' 4"  (1.626 m)       Intake/Output Summary (Last 24 hours) at 08/22/2018 0917 Last data filed at 08/22/2018 0513 Gross per 24 hour  Intake 662.84 ml  Output 525 ml  Net 137.84 ml   Filed Weights   08/21/18 1122 08/21/18 1630 08/22/18 0624  Weight: 120.2  kg 118.4 kg 119.4 kg    Telemetry    SR at 50-60s- Personally Reviewed  ECG   None today   Physical Exam   GEN: obese female in no acute distress.   Neck: No JVD Cardiac: RRR, no murmurs, rubs, or gallops.  Respiratory: Clear to auscultation bilaterally. GI: Soft, nontender, non-distended  MS: No edema; No deformity. Neuro:  Nonfocal  Psych: Normal affect   Labs    Chemistry Recent Labs  Lab 08/21/18 1035 08/21/18 1557 08/22/18 0225  NA 139  --  139  K 4.1  --  3.7  CL 104  --  106  CO2 25  --  27  GLUCOSE 98  --  117*  BUN 8  --  8  CREATININE 1.43*  --  1.38*  CALCIUM 9.2  --  8.9  PROT  --  6.4*  --   ALBUMIN  --  2.6*  --   AST  --  16  --   ALT  --  16  --   ALKPHOS  --  55  --   BILITOT  --  0.6  --   GFRNONAA 40*  --  42*  GFRAA 47*  --  72*  ANIONGAP 10  --  6     Hematology Recent Labs  Lab 08/21/18 1035 08/22/18 0225  WBC 8.2 7.9  RBC 5.23* 4.82  HGB 15.2* 14.3  HCT 50.0* 44.4  MCV 95.6 92.1  MCH 29.1 29.7  MCHC 30.4 32.2  RDW 13.2 13.2  PLT 235 195    Cardiac Enzymes Recent Labs  Lab 08/21/18 1557 08/21/18 2035 08/22/18 0225  TROPONINI 0.27* 0.22* 0.20*    Recent Labs  Lab 08/21/18 1048 08/21/18 1340  TROPIPOC 0.13* 0.22*     Radiology    Dg Chest 2 View  Result Date: 08/21/2018 CLINICAL DATA:  Shortness of breath, chest tightness. Right arm pain EXAM: CHEST - 2 VIEW COMPARISON:  12/04/2015 FINDINGS: Mild peribronchial thickening. Heart is normal size. Minimal bibasilar atelectasis. No effusions or acute bony abnormality. IMPRESSION: Bronchitic changes, bibasilar atelectasis. Electronically Signed   By: Rolm Baptise M.D.   On: 08/21/2018 11:08    Cardiac Studies   Pending echo and cath  Patient Profile     57 y.o. female with hx of DM-2, HTN, tobacco use, obesity , fibromyalgia, CKD-3 and MS now presents with chest pain and found to have NSTEMI   Assessment & Plan    1. NSTEMI - Troponin peaked at 0.27, now  trending down. Chest pain free on nitro and heparin gtt. Cath today. Continue ASA, statin and BB.   2. HTN - BP improving. Continue BB and Iv nitro. Will add oral agent post cath.   3. HLD - 08/21/2018: Cholesterol 129; HDL 42; LDL Cholesterol 81; Triglycerides 30; VLDL 6  - Continue statin  4. CKD stage III - Scr at baseline 1.3-1.4  5. Tobacco smoking - Advised cessation  For questions or updates, please contact [place] Please consult [email or web address] for contact info under        SignedLeanor Kail, PA  08/22/2018, 9:17 AM

## 2018-08-22 NOTE — Progress Notes (Signed)
  Echocardiogram 2D Echocardiogram has been performed.  Tiffany Brady 08/22/2018, 4:02 PM

## 2018-08-22 NOTE — H&P (View-Only) (Signed)
The patient has been seen in conjunction with Vin Bhagat, PA-C. All aspects of care have been considered and discussed. The patient has been personally interviewed, examined, and all clinical data has been reviewed.   Awaiting cath.  Discharge plans will be dependent upon findings.  Low persistent elevation in troponin in the setting of significant coronary cardiovascular risk factors.  Progress Note  Patient Name: Tiffany Brady Date of Encounter: 08/22/2018  Primary Cardiologist: Sinclair Grooms, MD   Subjective   Feeling well. No chest pain, sob or palpitations.   Inpatient Medications    Scheduled Meds: . aspirin EC  81 mg Oral Daily  . atorvastatin  80 mg Oral q1800  . Dimethyl Fumarate  240 mg Oral BID  . DULoxetine  30 mg Oral Daily  . metoprolol tartrate  25 mg Oral BID  . nicotine  14 mg Transdermal Daily  . pregabalin  25 mg Oral BID  . sodium chloride flush  3 mL Intravenous Q12H   Continuous Infusions: . sodium chloride 10 mL/hr at 08/21/18 1829  . sodium chloride    . sodium chloride 1 mL/kg/hr (08/22/18 0503)  . bivalirudin (ANGIOMAX) infusion 0.5 mg/mL (Non-ACS indications) 0.15 mg/kg/hr (08/22/18 0741)  . magnesium sulfate 1 - 4 g bolus IVPB    . nitroGLYCERIN 5 mcg/min (08/21/18 1810)   PRN Meds: sodium chloride, acetaminophen, cyclobenzaprine, nitroGLYCERIN, ondansetron (ZOFRAN) IV, sodium chloride flush   Vital Signs    Vitals:   08/21/18 1630 08/21/18 2105 08/21/18 2320 08/22/18 0624  BP:  128/88 (!) 158/88 (!) 154/98  Pulse:  63  72  Resp: 17 18  18   Temp:  97.7 F (36.5 C)  98 F (36.7 C)  TempSrc: Oral Oral  Oral  SpO2: 100% 98%  93%  Weight: 118.4 kg   119.4 kg  Height: 5\' 4"  (1.626 m)       Intake/Output Summary (Last 24 hours) at 08/22/2018 0917 Last data filed at 08/22/2018 0513 Gross per 24 hour  Intake 662.84 ml  Output 525 ml  Net 137.84 ml   Filed Weights   08/21/18 1122 08/21/18 1630 08/22/18 0624  Weight: 120.2  kg 118.4 kg 119.4 kg    Telemetry    SR at 50-60s- Personally Reviewed  ECG   None today   Physical Exam   GEN: obese female in no acute distress.   Neck: No JVD Cardiac: RRR, no murmurs, rubs, or gallops.  Respiratory: Clear to auscultation bilaterally. GI: Soft, nontender, non-distended  MS: No edema; No deformity. Neuro:  Nonfocal  Psych: Normal affect   Labs    Chemistry Recent Labs  Lab 08/21/18 1035 08/21/18 1557 08/22/18 0225  NA 139  --  139  K 4.1  --  3.7  CL 104  --  106  CO2 25  --  27  GLUCOSE 98  --  117*  BUN 8  --  8  CREATININE 1.43*  --  1.38*  CALCIUM 9.2  --  8.9  PROT  --  6.4*  --   ALBUMIN  --  2.6*  --   AST  --  16  --   ALT  --  16  --   ALKPHOS  --  55  --   BILITOT  --  0.6  --   GFRNONAA 40*  --  42*  GFRAA 47*  --  42*  ANIONGAP 10  --  6     Hematology Recent Labs  Lab 08/21/18 1035 08/22/18 0225  WBC 8.2 7.9  RBC 5.23* 4.82  HGB 15.2* 14.3  HCT 50.0* 44.4  MCV 95.6 92.1  MCH 29.1 29.7  MCHC 30.4 32.2  RDW 13.2 13.2  PLT 235 195    Cardiac Enzymes Recent Labs  Lab 08/21/18 1557 08/21/18 2035 08/22/18 0225  TROPONINI 0.27* 0.22* 0.20*    Recent Labs  Lab 08/21/18 1048 08/21/18 1340  TROPIPOC 0.13* 0.22*     Radiology    Dg Chest 2 View  Result Date: 08/21/2018 CLINICAL DATA:  Shortness of breath, chest tightness. Right arm pain EXAM: CHEST - 2 VIEW COMPARISON:  12/04/2015 FINDINGS: Mild peribronchial thickening. Heart is normal size. Minimal bibasilar atelectasis. No effusions or acute bony abnormality. IMPRESSION: Bronchitic changes, bibasilar atelectasis. Electronically Signed   By: Rolm Baptise M.D.   On: 08/21/2018 11:08    Cardiac Studies   Pending echo and cath  Patient Profile     57 y.o. female with hx of DM-2, HTN, tobacco use, obesity , fibromyalgia, CKD-3 and MS now presents with chest pain and found to have NSTEMI   Assessment & Plan    1. NSTEMI - Troponin peaked at 0.27, now  trending down. Chest pain free on nitro and heparin gtt. Cath today. Continue ASA, statin and BB.   2. HTN - BP improving. Continue BB and Iv nitro. Will add oral agent post cath.   3. HLD - 08/21/2018: Cholesterol 129; HDL 42; LDL Cholesterol 81; Triglycerides 30; VLDL 6  - Continue statin  4. CKD stage III - Scr at baseline 1.3-1.4  5. Tobacco smoking - Advised cessation  For questions or updates, please contact [place] Please consult [email or web address] for contact info under        SignedLeanor Kail, PA  08/22/2018, 9:17 AM

## 2018-08-23 ENCOUNTER — Other Ambulatory Visit: Payer: Self-pay | Admitting: Internal Medicine

## 2018-08-23 ENCOUNTER — Encounter (HOSPITAL_COMMUNITY): Payer: Self-pay | Admitting: Cardiovascular Disease

## 2018-08-23 ENCOUNTER — Telehealth: Payer: Self-pay | Admitting: Cardiology

## 2018-08-23 DIAGNOSIS — J301 Allergic rhinitis due to pollen: Secondary | ICD-10-CM

## 2018-08-23 LAB — BASIC METABOLIC PANEL
Anion gap: 10 (ref 5–15)
BUN: 9 mg/dL (ref 6–20)
CO2: 21 mmol/L — ABNORMAL LOW (ref 22–32)
Calcium: 8.6 mg/dL — ABNORMAL LOW (ref 8.9–10.3)
Chloride: 107 mmol/L (ref 98–111)
Creatinine, Ser: 1.2 mg/dL — ABNORMAL HIGH (ref 0.44–1.00)
GFR calc Af Amer: 58 mL/min — ABNORMAL LOW (ref >60)
GFR calc non Af Amer: 50 mL/min — ABNORMAL LOW (ref >60)
Glucose, Bld: 107 mg/dL — ABNORMAL HIGH (ref 70–99)
POTASSIUM: 3.7 mmol/L (ref 3.5–5.1)
Sodium: 138 mmol/L (ref 135–145)

## 2018-08-23 LAB — CBC
HCT: 42.8 % (ref 36.0–46.0)
Hemoglobin: 14 g/dL (ref 12.0–15.0)
MCH: 30 pg (ref 26.0–34.0)
MCHC: 32.7 g/dL (ref 30.0–36.0)
MCV: 91.6 fL (ref 80.0–100.0)
Platelets: 198 10*3/uL (ref 150–400)
RBC: 4.67 MIL/uL (ref 3.87–5.11)
RDW: 13 % (ref 11.5–15.5)
WBC: 7.2 10*3/uL (ref 4.0–10.5)
nRBC: 0 % (ref 0.0–0.2)

## 2018-08-23 LAB — MAGNESIUM: Magnesium: 2 mg/dL (ref 1.7–2.4)

## 2018-08-23 MED ORDER — ATORVASTATIN CALCIUM 80 MG PO TABS: 80.0000 mg | ORAL_TABLET | Freq: Every day | ORAL | 6 refills | Status: DC

## 2018-08-23 MED ORDER — AMLODIPINE BESYLATE 5 MG PO TABS
5.0000 mg | ORAL_TABLET | Freq: Every day | ORAL | Status: DC
  Administered 2018-08-23: 10:00:00 5 mg via ORAL
  Filled 2018-08-23: qty 1

## 2018-08-23 MED ORDER — NITROGLYCERIN 0.4 MG SL SUBL: 0.4000 mg | SUBLINGUAL_TABLET | SUBLINGUAL | 12 refills | Status: AC | PRN

## 2018-08-23 MED ORDER — CLOPIDOGREL BISULFATE 75 MG PO TABS: 75.0000 mg | ORAL_TABLET | Freq: Every day | ORAL | 11 refills | Status: DC

## 2018-08-23 MED ORDER — CLOPIDOGREL BISULFATE 75 MG PO TABS: 75.0000 mg | ORAL_TABLET | Freq: Every day | ORAL | Status: DC

## 2018-08-23 MED ORDER — TICAGRELOR 90 MG PO TABS: 90.0000 mg | ORAL_TABLET | Freq: Two times a day (BID) | ORAL | Status: DC

## 2018-08-23 MED ORDER — AMLODIPINE BESYLATE 5 MG PO TABS: 5.0000 mg | ORAL_TABLET | Freq: Every day | ORAL | 6 refills | Status: DC

## 2018-08-23 MED ORDER — CLOPIDOGREL BISULFATE 75 MG PO TABS
300.0000 mg | ORAL_TABLET | Freq: Every day | ORAL | Status: DC
  Administered 2018-08-23: 12:00:00 300 mg via ORAL
  Filled 2018-08-23: qty 4

## 2018-08-23 MED ORDER — METOPROLOL TARTRATE 25 MG PO TABS: 25.0000 mg | ORAL_TABLET | Freq: Two times a day (BID) | ORAL | 6 refills | Status: DC

## 2018-08-23 MED FILL — Heparin Sod (Porcine)-NaCl IV Soln 1000 Unit/500ML-0.9%: INTRAVENOUS | Qty: 1000 | Status: AC

## 2018-08-23 NOTE — Care Management Note (Signed)
Case Management Note  Patient Details  Name: Moe Brier MRN: [id number] Date of Birth: July 27, 1961  Subjective/Objective:     From home, s/p stent intervention, will be on brilinta, NCM gave patient information regarding co pay.                 Action/Plan: DC home when ready.  Expected Discharge Date:  08/23/18               Expected Discharge Plan:  Home/Self Care  In-House Referral:     Discharge planning Services  CM Consult, Medication Assistance  Post Acute Care Choice:    Choice offered to:     DME Arranged:    DME Agency:     HH Arranged:    HH Agency:     Status of Service:  Completed, signed off  If discussed at [employer] of Stay Meetings, dates discussed:    Additional Comments:  Zenon Mayo, RN 08/23/2018, 11:56 AM

## 2018-08-23 NOTE — Progress Notes (Signed)
CARDIAC REHAB PHASE I   PRE:  Rate/Rhythm: 73 SR  BP:  Supine: 169/88  Sitting:   Standing:    SaO2:   MODE:  Ambulation: 375 ft   POST:  Rate/Rhythm: 83 SR  BP:  Supine:   Sitting: 204/100, 199/113  Standing:    SaO2: 97%RA [id number] Pt walked 375 ft on RA with her cane. Tolerated fairly well. No CP. MI education completed with pt who voiced understanding. Stressed importance of brilinta with stent. Discussed NTG use, MI restrictions, heart healthy diet and watching carbs, walking as tolerated with MS, CRP 2, smoking cessation. Pt stated she has to go outside of her apartment to smoke and this is helping her quit. Referred to [place] CRP 2. BP elevated. RN notified. Gave smoking cessation handout and encouraged to call [phone] as needed.   Graylon Good, RN BSN  08/23/2018 9:07 AM

## 2018-08-23 NOTE — Telephone Encounter (Signed)
Patient is currently admitted in hospital @9 :43am.

## 2018-08-23 NOTE — Telephone Encounter (Signed)
New Message  Patient has a TOC appt with Arcelia Jew 09/07/2018.

## 2018-08-23 NOTE — Discharge Summary (Addendum)
The patient has been seen in conjunction with Vin Bhagat, PAC. All aspects of care have been considered and discussed. The patient has been personally interviewed, examined, and all clinical data has been reviewed.   The patient did well with single-vessel PCI of ramus intermedius.  No recurrence of pain since intervention.  Aggressive secondary risk modification including smoking cessation discussed in detail.  10 to 14-day TOC.  46-monthdual antiplatelet therapy.   Discharge Summary    Patient ID: Tiffany KensingerMRN: [id number] DOB: [id number] Admit date: 08/21/2018 Discharge date: 08/23/2018  Primary Care Provider: JLadell Pier MD  Primary Cardiologist: HSinclair Grooms MD   Discharge Diagnoses    Active Problems:   NSTEMI (non-ST elevated myocardial infarction) [place]   HTN   HLD   CKD stage III   Tobacco abuse  Allergies Allergies  Allergen Reactions  . Pork-Derived Products Hives    Diagnostic Studies/Procedures    LEFT HEART CATH AND CORONARY ANGIOGRAPHY  Conclusion     Ost Ramus to Ramus lesion is 90% stenosed.  A drug-eluting stent was successfully placed using a STENT SYNERGY DES 2.75X16.  Post intervention, there is a 0% residual stenosis.   1. 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 Mild nonobstructive dz of the LAD, circumflex, and RCA 3. Normal LV function by echo   Diagnostic  Dominance: Right    Intervention         Echo  08/22/18 Study Conclusions  - Left ventricle: The cavity size was normal. Systolic function was   normal. The estimated ejection fraction was in the range of 60%   to 65%. Wall motion was normal; there were no regional wall   motion abnormalities. Doppler parameters are consistent with   abnormal left ventricular relaxation (grade 1 diastolic   dysfunction).  Impressions:  - Compared to the prior study,  there has been no significant   interval change.                                                                   History of Present Illness     57y.o. female with hx of DM-2, HTN, tobacco use, obesity , fibromyalgia, CKD-3 and MS now presents with chest pain and found to have NSTEMI.  Presented to ER 12/10 with chest pain x 2 days.  She had one episode at rest on Sunday with vomiting.  Pain as pressure in chest and to Rt shoulder.  No SOB, no diaphoresis.  Took PPI and eventually pain resolved.  Due to recurrent pain she came to ER.  Usually with her MS she uses her cane to ambulate but is able to do ADLs and housekeeping.       Previous echo 04/03/14 with EF 60-65%, no RWMA G1DD    Troponin 0.13 and troponin 0.22  EKG SR no acute changes Na 139 K+ 4.1 Cr 1.43  hgb 15.2 and plts 235.  2 V CXR Bronchitic changes, bibasilar atelectasis  Has rec'd ASA 324 mg in ER. BP 141/98 to 176/109 with continued mild chest discomfort. endorse. snoring and periods of apnea per pt.    Hospital Course  Consultants: None  1. NSTEMI - Troponin peaked at 0.27, now trending down. Chest pain free on nitro and heparin gtt. Cath showed severe single vessel CAD with severe stenosis of the ramus intermedius, treated successfully with a 2.75x16 mm Synergy DES. Otherwise mild non obstructive disease. Echo showed normal LVEF with grade 1 DD.   - Patient had anxiety attack with hard to get breath last evening and given xanax. However her SOB never went way. She got her brillinta dose this morning at 4am but did not noted much difference as she went to sleep right away. She did noted dyspnea while walking to bathroom this morning.  - Ambulated with cardiac rehab.  Continue ASA, statin, BB. Changed brillinta to Plavix for possible dyspnea.   2. HTN - BP improving. Continue BB. Add amlodipine 63m daily.   3. HLD - 08/21/2018: Cholesterol 129; HDL 42; LDL Cholesterol 81; Triglycerides 30; VLDL 6  -  Continue statin  4. CKD stage III - Baseline Scr of  1.2-1.4. SCr of 1.2 today. Will continue to hold her Hyzaar at discharge. Reassess at discharge.   5. Tobacco smoking - Advised cessation  6. Snoring with apneic episode  - She will need sleep study as outpatient    Discharge Vitals Blood pressure (!) 174/96, pulse 62, temperature 98.1 F (36.7 C), temperature source Oral, resp. rate 16, height 5' 4"  (1.626 m), weight 120.3 kg, last menstrual period 04/21/2014, SpO2 96 %.  Filed Weights   08/21/18 1630 08/22/18 0624 08/23/18 0633  Weight: 118.4 kg 119.4 kg 120.3 kg   Physical Exam  Constitutional: She is oriented to person, place, and time and well-developed, well-nourished, and in no distress.  HENT:  Head: Normocephalic and atraumatic.  Eyes: Pupils are equal, round, and reactive to light. Conjunctivae and EOM are normal.  Neck: Normal range of motion. Neck supple.  Cardiovascular: Normal rate and regular rhythm.  Right radial cath site without hematoma  Pulmonary/Chest: Effort normal and breath sounds normal.  Abdominal: Soft. Bowel sounds are normal.  Musculoskeletal: Normal range of motion.  Neurological: She is alert and oriented to person, place, and time.  Skin: Skin is warm and dry.  Psychiatric: Affect normal.    Labs & Radiologic Studies    CBC Recent Labs    08/22/18 0225 08/23/18 0246  WBC 7.9 7.2  HGB 14.3 14.0  HCT 44.4 42.8  MCV 92.1 91.6  PLT 195 1638  Basic Metabolic Panel Recent Labs    08/21/18 1557 08/22/18 0225 08/23/18 0246  NA  --  139 138  K  --  3.7 3.7  CL  --  106 107  CO2  --  27 21*  GLUCOSE  --  117* 107*  BUN  --  8 9  CREATININE  --  1.38* 1.20*  CALCIUM  --  8.9 8.6*  MG 1.6*  --  2.0   Liver Function Tests Recent Labs    08/21/18 1557  AST 16  ALT 16  ALKPHOS 55  BILITOT 0.6  PROT 6.4*  ALBUMIN 2.6*   Cardiac Enzymes Recent Labs    08/21/18 1557 08/21/18 2035 08/22/18 0225  TROPONINI 0.27* 0.22*  0.20*   Hemoglobin A1C Recent Labs    08/21/18 1557  HGBA1C 6.1*   Fasting Lipid Panel Recent Labs    08/21/18 1442  CHOL 129  HDL 42  LDLCALC 81  TRIG 30  CHOLHDL 3.1   Thyroid Function Tests Recent Labs    08/21/18 1557  TSH 1.673   Dg Chest 2 View  Result Date: 08/21/2018 CLINICAL DATA:  Shortness of breath, chest tightness. Right arm pain EXAM: CHEST - 2 VIEW COMPARISON:  12/04/2015 FINDINGS: Mild peribronchial thickening. Heart is normal size. Minimal bibasilar atelectasis. No effusions or acute bony abnormality. IMPRESSION: Bronchitic changes, bibasilar atelectasis. Electronically Signed   By: Rolm Baptise M.D.   On: 08/21/2018 11:08   Disposition   Pt is being discharged home today in good condition.  Follow-up Plans & Appointments    Follow-up Information    Isaiah Serge, NP. Go on 09/07/2018.   Specialties:  Cardiology, Radiology Why:  '@9am'$  for post hospital follow up Contact information: [place] [address] [phone]          Discharge Instructions    Amb Referral to Cardiac Rehabilitation   Complete by:  As directed    Diagnosis:   NSTEMI Coronary Stents     Diet - low sodium heart healthy   Complete by:  As directed    Discharge instructions   Complete by:  As directed    NO HEAVY LIFTING (>10lbs) X 2 WEEKS. NO SEXUAL ACTIVITY X 2 WEEKS. NO DRIVING X 1 WEEK. NO SOAKING BATHS, HOT TUBS, POOLS, ETC., X 7 DAYS.   Increase activity slowly   Complete by:  As directed      Discharge Medications   Allergies as of 08/23/2018      Reactions   Pork-derived Products Hives      Medication List    STOP taking these medications   losartan-hydrochlorothiazide 100-25 MG tablet Commonly known as:  HYZAAR   nicotine 14 mg/24hr patch Commonly known as:  NICODERM CQ - dosed in mg/24 hours     TAKE these medications   AMBULATORY NON FORMULARY MEDICATION [place] (4 prong) DX: G35   amLODipine 5 MG  tablet Commonly known as:  NORVASC Take 1 tablet (5 mg total) by mouth daily. Start taking on:  August 24, 2018   aspirin EC 81 MG tablet Take 1 tablet (81 mg total) by mouth daily.   atorvastatin 80 MG tablet Commonly known as:  LIPITOR Take 1 tablet (80 mg total) by mouth daily at 6 PM.   clopidogrel 75 MG tablet Commonly known as:  PLAVIX Take 1 tablet (75 mg total) by mouth daily. Start taking on:  August 24, 2018   cyclobenzaprine 10 MG tablet Commonly known as:  FLEXERIL Take 1 tablet (10 mg total) by mouth 2 (two) times daily as needed for muscle spasms.   diclofenac sodium 1 % Gel Commonly known as:  VOLTAREN APPLY TWO GRAMS TOPICALLY FOUR TIMES A DAY What changed:  See the new instructions.   Dimethyl Fumarate 240 MG Cpdr Commonly known as:  TECFIDERA Take 1 capsule (240 mg total) by mouth 2 (two) times daily.   DULoxetine 30 MG capsule Commonly known as:  CYMBALTA TAKE ONE CAPSULE BY MOUTH DAILY   fluticasone 50 MCG/ACT nasal spray Commonly known as:  FLONASE PLACE ONE SPRAY INTO BOTH NOSTRILS DAILY AS NEEDED FOR ALLERGIES OR RHINITIS What changed:  See the new instructions.   glucose blood test strip Commonly known as:  TRUE METRIX BLOOD GLUCOSE TEST Use as instructed   metoprolol tartrate 25 MG tablet Commonly known as:  LOPRESSOR Take 1 tablet (25 mg total) by mouth 2 (two) times daily.   nitroGLYCERIN 0.4 MG SL tablet Commonly known as:  NITROSTAT Place 1 tablet (0.4 mg total)  under the tongue every 5 (five) minutes x 3 doses as needed for chest pain.   pregabalin 25 MG capsule Commonly known as:  LYRICA Take 1 capsule (25 mg total) by mouth 2 (two) times daily.   TRUE METRIX GO GLUCOSE METER w/Device Kit 1 each by Does not apply route every 8 (eight) hours as needed.   TRUEPLUS LANCETS 26G Misc 1 each by Does not apply route every 8 (eight) hours as needed.        Acute coronary syndrome (MI, NSTEMI, STEMI, etc) this admission?: Yes.      AHA/ACC Clinical Performance & Quality Measures: 5. Aspirin prescribed? - Yes 6. ADP Receptor Inhibitor (Plavix/Clopidogrel, Brilinta/Ticagrelor or Effient/Prasugrel) prescribed (includes medically managed patients)? - Yes 7. Beta Blocker prescribed? - Yes 8. High Intensity Statin (Lipitor 40-87m or Crestor 20-444m prescribed? - Yes 9. EF assessed during THIS hospitalization? - Yes 10. For EF <40%, was ACEI/ARB prescribed? - Not Applicable (EF >/= 4024%11. For EF <40%, Aldosterone Antagonist (Spironolactone or Eplerenone) prescribed? - Not Applicable (EF >/= 4040%12. Cardiac Rehab Phase II ordered (Included Medically managed Patients)? - Yes    Outstanding Labs/Studies   Consider OP f/u labs 6-8 weeks given statin initiation this admission.  Duration of Discharge Encounter   Greater than 30 minutes including physician time.  SiJarrett SohoPA 08/23/2018, 11:53 AM

## 2018-08-23 NOTE — Care Management (Signed)
#   1.   S/W  DAVE  @ [facility]  # [phone]  BRILINTA   90 MG BID COVER- YES CO-PAY- $ 3.80 TIER- 4 DRUG PRIOR APPROVAL- NO  NO DEDUCTIBLE / INITIAL COVERAGE PHASE CATEGORY-TWO LOWE INCOME SUBSIDY  PREFERRED  PHARMACY : YES - [facility]

## 2018-08-23 NOTE — Discharge Instructions (Signed)
Information about your medication: Plavix (anti-platelet agent) ° °Generic Name (Brand): clopidogrel (Plavix), once daily medication ° °PURPOSE: You are taking this medication along with aspirin to lower your chance of having a heart attack, stroke, or blood clots in your heart stent. These can be fatal. Brilinta and aspirin help prevent platelets from sticking together and forming a clot that can block an artery or your stent.  ° °Common SIDE EFFECTS you may experience include: bruising or bleeding more easily, shortness of breath ° °Do not stop taking PLAVIX without talking to the doctor who prescribes it for you. People who are treated with a stent and stop taking Plavix too soon, have a higher risk of getting a blood clot in the stent, having a heart attack, or dying. If you stop Plavix because of bleeding, or for other reasons, your risk of a heart attack or stroke may increase.  ° °Tell all of your doctors and dentists that you are taking Plavix. They should talk to the doctor who prescribed plavix for you before you have any surgery or invasive procedure.  ° °Contact your health care provider if you experience: severe or uncontrollable bleeding, pink/red/brown urine, vomiting blood or vomit that looks like "coffee grounds", red or black stools (looks like tar), coughing up blood or blood clots °---------------------------------------------------------------------------------------------------------------------- ° °

## 2018-08-23 NOTE — Progress Notes (Addendum)
DISCHARGE RECOMMENDATIONS  No recurrence of angina overnight.  Access site is unremarkable.  Cardiac exam reveals no gallop or rub.  Lungs are clear.  Labs are stable, with creatinine 1.2 and hemoglobin 14.  53-month DAPT with aspirin and Plavix since she is already having difficulty with ticagrelor causing dyspnea.  7 to 14-day TOC follow-up.

## 2018-08-24 ENCOUNTER — Telehealth: Payer: Self-pay

## 2018-08-24 NOTE — Telephone Encounter (Signed)
Transition Care Management Follow-up Telephone Call  Date of discharge and from where:   08/23/2018 - [facility]   How have you been since you were released from the hospital? " feeling okay"  Any questions or concerns? Her only question was if she should be taking aspirin. Informed her that it is still on her medication instructions to take aspirin EC 81 mg daily. She then asked about vitamin D - should she take that?. Informed her that Dr Wynetta Emery would be notified of her question.   Items Reviewed:  Did the pt receive and understand the discharge instructions provided? Yes she has them and has reviewed them. She said that she didn't have any questions other than medication questions noted above.  Medications obtained and verified? She said that she has all medications.  La[facility] provides home delivery. She said that she has been taking the as directed. She did not want to review the list of medications but this CM  Instructed her to review the list again and call back if she has questions. This CM reminded her that as per AVS, she is to stop taking losartan - hydrochlorothiazide and stop using nicotine patch and there are 5 new medications that she is to start taking.   Any new allergies since your discharge? None reported   Do you have support at home? Lives alone  Other (ie: DME, Home Health, etc) no DME reported other than a cane  Functional Questionnaire: (I = Independent and D = Dependent) ADL's: independent with cane.   Follow up appointments reviewed:    PCP Hospital f/u appt confirmed? Appointment was scheduled for 08/28/18 @ 1010.   [facility] f/u appt confirmed? 09/07/18 - cardiolgy  Are transportation arrangements needed? She usually takes the bus but may need cab to appointment on 08/28/18  If their condition worsens, is the pt aware to call  their PCP or go to the ED? yes  Was the patient provided with contact information for the PCP's office  or ED?she has the phone number  Was the pt encouraged to call back with questions or concerns? yes

## 2018-08-27 ENCOUNTER — Telehealth: Payer: Self-pay

## 2018-08-27 ENCOUNTER — Telehealth (HOSPITAL_COMMUNITY): Payer: Self-pay

## 2018-08-27 NOTE — Telephone Encounter (Signed)
Call placed to the patient and confirmed her appointment for tomorrow  - 08/28/18 @ 1010.  She said that she will need transportation to the clinic.  Informed her that a cab would be ordered and she needs to be ready by 0915.  . Address confirmed. She also said that she will bring all of her medications and will confirm the Vitamin D order when she is in the clinic tomorrow.   Gaetano Net, [facility] [employer] Lead, notified of the need for a cab

## 2018-08-27 NOTE — Telephone Encounter (Signed)
Called patient to see if she is interested in the Cardiac Rehab Program. Patient expressed interest. Explained scheduling process and went over insurance, patient verbalized understanding. Will contact patient for scheduling once f/u has been completed. 

## 2018-08-27 NOTE — Telephone Encounter (Signed)
Patient contacted regarding discharge from [facility] on 08/23/18.  Patient understands to follow up with provider Cecilie Kicks NP on 09/07/18 at 9:00 AM at [facility]. Patient understands discharge instructions? yes Patient understands medications and regiment? yes Patient understands to bring all medications to this visit? yes

## 2018-08-27 NOTE — Telephone Encounter (Signed)
Pt insurance is active and benefits verified through Medicare A/B Co-pay $0.00, DED $185.00/$185.00 met, out of pocket $0.00/$0.00 met, co-insurance 20%. No pre-authorization required. Passport, 08/27/18 @ 10:09AM, REF# [id number]  Will contact patient to see if she is interested in the Cardiac Rehab Program. If interested, patient will need to complete follow up appt. Once completed, patient will be contacted for scheduling upon review by the RN Navigator.

## 2018-08-28 ENCOUNTER — Ambulatory Visit: Payer: Medicare Other | Attending: Internal Medicine | Admitting: Internal Medicine

## 2018-08-28 ENCOUNTER — Telehealth: Payer: Self-pay

## 2018-08-28 ENCOUNTER — Encounter: Payer: Self-pay | Admitting: Internal Medicine

## 2018-08-28 VITALS — BP 146/92 | HR 72 | Temp 97.8°F | Resp 16 | Wt 265.8 lb

## 2018-08-28 DIAGNOSIS — Z7982 Long term (current) use of aspirin: Secondary | ICD-10-CM | POA: Insufficient documentation

## 2018-08-28 DIAGNOSIS — E559 Vitamin D deficiency, unspecified: Secondary | ICD-10-CM | POA: Insufficient documentation

## 2018-08-28 DIAGNOSIS — N183 Chronic kidney disease, stage 3 unspecified: Secondary | ICD-10-CM

## 2018-08-28 DIAGNOSIS — E669 Obesity, unspecified: Secondary | ICD-10-CM | POA: Diagnosis not present

## 2018-08-28 DIAGNOSIS — Z79899 Other long term (current) drug therapy: Secondary | ICD-10-CM | POA: Insufficient documentation

## 2018-08-28 DIAGNOSIS — I214 Non-ST elevation (NSTEMI) myocardial infarction: Secondary | ICD-10-CM | POA: Insufficient documentation

## 2018-08-28 DIAGNOSIS — I10 Essential (primary) hypertension: Secondary | ICD-10-CM

## 2018-08-28 DIAGNOSIS — Z720 Tobacco use: Secondary | ICD-10-CM

## 2018-08-28 DIAGNOSIS — I129 Hypertensive chronic kidney disease with stage 1 through stage 4 chronic kidney disease, or unspecified chronic kidney disease: Secondary | ICD-10-CM | POA: Diagnosis not present

## 2018-08-28 DIAGNOSIS — Z6841 Body Mass Index (BMI) 40.0 and over, adult: Secondary | ICD-10-CM | POA: Insufficient documentation

## 2018-08-28 DIAGNOSIS — F17210 Nicotine dependence, cigarettes, uncomplicated: Secondary | ICD-10-CM | POA: Diagnosis not present

## 2018-08-28 DIAGNOSIS — Z8249 Family history of ischemic heart disease and other diseases of the circulatory system: Secondary | ICD-10-CM | POA: Diagnosis not present

## 2018-08-28 DIAGNOSIS — E1122 Type 2 diabetes mellitus with diabetic chronic kidney disease: Secondary | ICD-10-CM | POA: Insufficient documentation

## 2018-08-28 DIAGNOSIS — M797 Fibromyalgia: Secondary | ICD-10-CM | POA: Insufficient documentation

## 2018-08-28 DIAGNOSIS — G894 Chronic pain syndrome: Secondary | ICD-10-CM | POA: Insufficient documentation

## 2018-08-28 DIAGNOSIS — G35 Multiple sclerosis: Secondary | ICD-10-CM | POA: Diagnosis not present

## 2018-08-28 DIAGNOSIS — Z7902 Long term (current) use of antithrombotics/antiplatelets: Secondary | ICD-10-CM | POA: Insufficient documentation

## 2018-08-28 DIAGNOSIS — I2510 Atherosclerotic heart disease of native coronary artery without angina pectoris: Secondary | ICD-10-CM | POA: Diagnosis not present

## 2018-08-28 DIAGNOSIS — E119 Type 2 diabetes mellitus without complications: Secondary | ICD-10-CM | POA: Diagnosis not present

## 2018-08-28 LAB — GLUCOSE, POCT (MANUAL RESULT ENTRY): POC Glucose: 117 mg/dl — AB (ref 70–99)

## 2018-08-28 MED ORDER — DULOXETINE HCL 30 MG PO CPEP: 30.0000 mg | ORAL_CAPSULE | Freq: Every day | ORAL | 5 refills | Status: DC

## 2018-08-28 MED ORDER — LOSARTAN POTASSIUM 25 MG PO TABS: 25.0000 mg | ORAL_TABLET | Freq: Every day | ORAL | 6 refills | Status: DC

## 2018-08-28 NOTE — Patient Instructions (Signed)
Always take the sublingual nitroglycerin with you to use as needed if you have any chest pain episodes.  Continue to cut back on cigarette smoking.  I commend you for setting a quit date.  We have changed Losartan/HCTZ to just Losartan 25 mg daily.

## 2018-08-28 NOTE — Telephone Encounter (Signed)
Met with the patient when she was in the clinic today. Provided her with a SCAT application. She said that she would complete and return to this CM.  She explained that her disability has been cut to about $ 661 /month. She is still waiting for a determination about her medicaid application.  She also said that she clarified the order for Vitamin D with Dr Wynetta Emery today.   Cab transportation provided for her back home.

## 2018-08-28 NOTE — Progress Notes (Signed)
Patient ID: Tiffany Brady, female    DOB: [birth year]  MRN: [id number]  CC: Transitional care visit Date of admission: 08/21/2018  Date of discharge: 08/23/2018 Date of telephone call by caseworker: 08/24/2018  Subjective: Tiffany Brady is a 57 y.o. female who presents for transition of care visit Her concerns today include:  Pt with hx of DM type 2, HTN, Tob, obesity, fibromyalgia, CKD stage 3 and MS.  Patient hospitalized on date stated above with NSTEMI.  Cardiac catheterization revealed 90% stenosis of a ramus lesion.  DES stent placed.  Echo revealed EF of 60 to 65% with normal wall motion.  Patient initially placed on Brilinta but developed some shortness of breath so it was changed to Plavix.  Due to her kidney function, Hyzaar was placed on hold with need for restart to be reassessed on today's.  Since discharge patient reports occasional sharp chest pains that would last a few seconds.  She reports compliance with Plavix, aspirin, beta-blocker and atorvastatin.  She continues to smoke but is trying to quit.  She states that she is set a quit date to 09/23/2018.  She is trying to cut back gradually.  She has a follow-up appointment with cardiology on the 27th of this month.   Patient Active Problem List   Diagnosis Date Noted  . NSTEMI (non-ST elevated myocardial infarction) ([place]) 08/21/2018  . De Quervain's disease (tenosynovitis) 08/02/2018  . Body mass index 40.0-44.9, adult ([place]) 08/02/2018  . Stress incontinence 05/08/2017  . Cervical radiculopathy 05/08/2017  . CKD (chronic kidney disease) stage 3, GFR 30-59 ml/min (HCC) 04/04/2017  . Vitamin D deficiency 04/04/2017  . Controlled type 2 diabetes mellitus without complication, without long-term current use of insulin ([place]) 04/04/2017  . Fibromyalgia 03/28/2016  . Multiple sclerosis ([place]) 11/02/2015  . Tobacco abuse 04/24/2014  . Back muscle spasm 01/28/2014  . Migraine headache 07/23/2013  . Essential hypertension  06/27/2013  . Chronic pain syndrome 06/27/2013  . Morbid obesity ([place]) 06/27/2013     Current Outpatient Medications on File Prior to Visit  Medication Sig Dispense Refill  . cholecalciferol (VITAMIN D3) 25 MCG (1000 UT) tablet Take 5,000 Units by mouth daily.    . AMBULATORY NON FORMULARY MEDICATION Walker (4 prong) DX: G35 1 Device 0  . amLODipine (NORVASC) 5 MG tablet Take 1 tablet (5 mg total) by mouth daily. 30 tablet 6  . aspirin EC 81 MG tablet Take 1 tablet (81 mg total) by mouth daily. 100 tablet 1  . atorvastatin (LIPITOR) 80 MG tablet Take 1 tablet (80 mg total) by mouth daily at 6 PM. 30 tablet 6  . Blood Glucose Monitoring Suppl (TRUE METRIX GO GLUCOSE METER) w/Device KIT 1 each by Does not apply route every 8 (eight) hours as needed. 1 kit 0  . clopidogrel (PLAVIX) 75 MG tablet Take 1 tablet (75 mg total) by mouth daily. 30 tablet 11  . cyclobenzaprine (FLEXERIL) 10 MG tablet Take 1 tablet (10 mg total) by mouth 2 (two) times daily as needed for muscle spasms. 40 tablet 2  . diclofenac sodium (VOLTAREN) 1 % GEL APPLY TWO GRAMS TOPICALLY FOUR TIMES A DAY (Patient taking differently: Apply 2 g topically 4 (four) times daily. ) 100 g 1  . Dimethyl Fumarate (TECFIDERA) 240 MG CPDR Take 1 capsule (240 mg total) by mouth 2 (two) times daily. 180 capsule 3  . fluticasone (FLONASE) 50 MCG/ACT nasal spray PLACE ONE SPRAY INTO BOTH NOSTRILS DAILY AS NEEDED FOR ALLERGIES OR RHINITIS  16 g 2  . glucose blood (TRUE METRIX BLOOD GLUCOSE TEST) test strip Use as instructed 100 each 12  . metoprolol tartrate (LOPRESSOR) 25 MG tablet Take 1 tablet (25 mg total) by mouth 2 (two) times daily. 60 tablet 6  . nitroGLYCERIN (NITROSTAT) 0.4 MG SL tablet Place 1 tablet (0.4 mg total) under the tongue every 5 (five) minutes x 3 doses as needed for chest pain. 25 tablet 12  . pregabalin (LYRICA) 25 MG capsule Take 1 capsule (25 mg total) by mouth 2 (two) times daily. 60 capsule 2  . TRUEPLUS LANCETS 26G  MISC 1 each by Does not apply route every 8 (eight) hours as needed. 100 each 12   No current facility-administered medications on file prior to visit.     Allergies  Allergen Reactions  . Pork-Derived Metallurgist    Social History   Socioeconomic History  . Marital status: Widowed    Spouse name: Not on file  . Number of children: Not on file  . Years of education: Not on file  . Highest education level: Not on file  Occupational History  . Not on file  Social Needs  . Financial resource strain: Not on file  . Food insecurity:    Worry: Not on file    Inability: Not on file  . Transportation needs:    Medical: Not on file    Non-medical: Not on file  Tobacco Use  . Smoking status: Current Every Day Smoker    Packs/day: 0.75    Years: 40.00    Pack years: 30.00    Types: Cigarettes  . Smokeless tobacco: Never Used  Substance and Sexual Activity  . Alcohol use: Yes    Alcohol/week: 0.0 standard drinks    Comment: occasional  . Drug use: No  . Sexual activity: Not on file  Lifestyle  . Physical activity:    Days per week: Not on file    Minutes per session: Not on file  . Stress: Not on file  Relationships  . Social connections:    Talks on phone: Not on file    Gets together: Not on file    Attends religious service: Not on file    Active member of club or organization: Not on file    Attends meetings of clubs or organizations: Not on file    Relationship status: Not on file  . Intimate partner violence:    Fear of current or ex partner: Not on file    Emotionally abused: Not on file    Physically abused: Not on file    Forced sexual activity: Not on file  Other Topics Concern  . Not on file  Social History Narrative   Lives with niece and her 3 children in a 2 story home.     Only goes upstairs once a day.  Has 1 child.  Does not work.  Trying to get disability.  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     Education: 10th grade.     Family History  Problem Relation Age of Onset  . Cancer Mother        Deceased  . Hypertension Mother   . Other Father        Deceased, 42  . HIV/AIDS Brother        Deceased  . Multiple sclerosis Daughter   . Breast cancer Neg Hx     Past Surgical History:  Procedure Laterality Date  .  FOOT SURGERY Right   . LEFT HEART CATH AND CORONARY ANGIOGRAPHY N/A 08/22/2018   Procedure: LEFT HEART CATH AND CORONARY ANGIOGRAPHY;  Surgeon: Sherren Mocha, MD;  Location: [facility];  Service: Cardiovascular;  Laterality: N/A;    ROS: Review of Systems Negative except as above. PHYSICAL EXAM: BP (!) 146/92   Pulse 72   Temp 97.8 F (36.6 C) (Oral)   Resp 16   Wt 265 lb 12.8 oz (120.6 kg)   LMP 04/21/2014   SpO2 96%   BMI 45.62 kg/m   BP 142/90 Physical Exam  General appearance - alert, well appearing, and in no distress Mental status - normal mood, behavior, speech, dress, motor activity, and thought processes Neck - supple, no significant adenopathy Chest - clear to auscultation, no wheezes, rales or rhonchi, symmetric air entry Heart - normal rate, regular rhythm, normal S1, S2, no murmurs, rubs, clicks or gallops Extremities -radial pulses are 3+ bilaterally.  Small area of resolving ecchymosis noted on the palmar surface of the right forearm.  No edema of the upper or lower extremities. MSK patient ambulates with a cane  Results for orders placed or performed in visit on 08/28/18  POCT glucose (manual entry)  Result Value Ref Range   POC Glucose 117 (A) 70 - 99 mg/dl    ASSESSMENT AND PLAN: 1. Coronary artery disease involving native coronary artery of native heart without angina pectoris Continue metoprolol, aspirin, Plavix, and Lipitor.  Stressed importance of taking DAPT consistently.  Plan is to continue for at least 1 year.  Strongly encouraged her to quit smoking.  Keep appointment with cardiology  2. Essential hypertension Not at goal.  Instead of  restarting Hyzaar, will put her back on a low dose of Cozaar. - losartan (COZAAR) 25 MG tablet; Take 1 tablet (25 mg total) by mouth daily.  Dispense: 30 tablet; Refill: 6  3. CKD (chronic kidney disease) stage 3, GFR 30-59 ml/min (HCC) Creatinine and GFR were improved on discharge compared to 1 month ago.  We will continue to monitor.  Avoid oral NSAIDs.  4. Tobacco abuse See #1 above.  5. Controlled type 2 diabetes mellitus without complication, without long-term current use of insulin (HCC) - POCT glucose (manual entry)  6. Fibromyalgia - DULoxetine (CYMBALTA) 30 MG capsule; Take 1 capsule (30 mg total) by mouth daily.  Dispense: 30 capsule; Refill: 5   Patient was given the opportunity to ask questions.  Patient verbalized understanding of the plan and was able to repeat key elements of the plan.   Orders Placed This Encounter  Procedures  . POCT glucose (manual entry)     Requested Prescriptions   Signed Prescriptions Disp Refills  . DULoxetine (CYMBALTA) 30 MG capsule 30 capsule 5    Sig: Take 1 capsule (30 mg total) by mouth daily.  Marland Kitchen losartan (COZAAR) 25 MG tablet 30 tablet 6    Sig: Take 1 tablet (25 mg total) by mouth daily.    Return in about 3 months (around 11/27/2018).  Karle Plumber, MD, FACP

## 2018-08-29 ENCOUNTER — Other Ambulatory Visit: Payer: Self-pay | Admitting: Internal Medicine

## 2018-08-31 NOTE — Telephone Encounter (Signed)
Medication is not on current med list, it was marked as discontinued on 06/18/18 during a neurology appt, the discontinue reason was noted as error, please authorize refill if the patient should continue on this therapy.

## 2018-09-05 NOTE — Progress Notes (Signed)
Cardiology Office Note   Date:  09/07/2018   ID:  Tiffany Brady, DOB June 19, 1961, MRN [id number]  PCP:  Ladell Pier, MD  Cardiologist:  Dr. Tamala Julian     Chief Complaint  Patient presents with  . Hospitalization Follow-up    NSTEMI and stent      History of Present Illness: Tiffany Brady is a 57 y.o. female who presents for post hospitalization visit after NSTEMI.   She has a hx of DM-2, HTN, tobacco use, obesity , fibromyalgia, CKD-3 and MS.  She presented to ER 08/21/18 with chest pain at rest and vomiting. Her troponin was elevated and she was admitted, placed on IV heparin and plans for cath.  Her BP was elevated and with continued chest pain IV NTG was started.  Cardiac cath with single vessel disease of ramus intermedius and DES- Synergy was placed.   Normal LV function by echo.  She had mild nonobstructive disease of the LAD and LCX and RCA.   She did well post procedure and was discharged 08/23/18 after ambulation with rehab.  She does walk with a cane.  Now on plavix and asa and lipitor 80 mg   Today she is doing well no dyspnea, brief Lt lat wall chest discomfort but not like her prior angina.  She has chronic dizziness from her MS.  She has been taking her meds correctly.  She did not take her BP meds this AM- will take when she returns home.  She is trying to eat healthy.  Has difficulty with exercise due to MS.    Past Medical History:  Diagnosis Date  . Chronic kidney disease    pt reported kidney failure  . Chronic pain syndrome   . Gout   . Hypertension   . MS (multiple sclerosis) ([place])   . Tobacco use     Past Surgical History:  Procedure Laterality Date  . FOOT SURGERY Right   . LEFT HEART CATH AND CORONARY ANGIOGRAPHY N/A 08/22/2018   Procedure: LEFT HEART CATH AND CORONARY ANGIOGRAPHY;  Surgeon: Sherren Mocha, MD;  Location: [place] [facility];  Service: Cardiovascular;  Laterality: N/A;     Current Outpatient Medications  Medication Sig  Dispense Refill  . amLODipine (NORVASC) 5 MG tablet Take 1 tablet (5 mg total) by mouth daily. 30 tablet 6  . aspirin EC 81 MG tablet Take 1 tablet (81 mg total) by mouth daily. 100 tablet 1  . atorvastatin (LIPITOR) 80 MG tablet Take 1 tablet (80 mg total) by mouth daily at 6 PM. 30 tablet 6  . Blood Glucose Monitoring Suppl (TRUE METRIX GO GLUCOSE METER) w/Device KIT 1 each by Does not apply route every 8 (eight) hours as needed. 1 kit 0  . cholecalciferol (VITAMIN D3) 25 MCG (1000 UT) tablet Take 5,000 Units by mouth daily.    . clopidogrel (PLAVIX) 75 MG tablet Take 1 tablet (75 mg total) by mouth daily. 30 tablet 11  . cyclobenzaprine (FLEXERIL) 10 MG tablet Take 1 tablet (10 mg total) by mouth 2 (two) times daily as needed for muscle spasms. 40 tablet 2  . diclofenac sodium (VOLTAREN) 1 % GEL APPLY TWO GRAMS TOPICALLY FOUR TIMES A DAY 100 g 1  . Dimethyl Fumarate (TECFIDERA) 240 MG CPDR Take 1 capsule (240 mg total) by mouth 2 (two) times daily. 180 capsule 3  . DULoxetine (CYMBALTA) 30 MG capsule Take 1 capsule (30 mg total) by mouth daily. 30 capsule 5  . fluticasone (FLONASE)  50 MCG/ACT nasal spray PLACE ONE SPRAY INTO BOTH NOSTRILS DAILY AS NEEDED FOR ALLERGIES OR RHINITIS 16 g 2  . glucose blood (TRUE METRIX BLOOD GLUCOSE TEST) test strip Use as instructed 100 each 12  . losartan (COZAAR) 25 MG tablet Take 1 tablet (25 mg total) by mouth daily. 30 tablet 6  . metoprolol tartrate (LOPRESSOR) 25 MG tablet Take 1 tablet (25 mg total) by mouth 2 (two) times daily. 60 tablet 6  . nitroGLYCERIN (NITROSTAT) 0.4 MG SL tablet Place 1 tablet (0.4 mg total) under the tongue every 5 (five) minutes x 3 doses as needed for chest pain. 25 tablet 12  . oxybutynin (DITROPAN) 5 MG tablet TAKE ONE TABLET BY MOUTH TWICE A DAY 60 tablet 3  . pregabalin (LYRICA) 25 MG capsule Take 1 capsule (25 mg total) by mouth 2 (two) times daily. 60 capsule 2  . TRUEPLUS LANCETS 26G MISC 1 each by Does not apply route  every 8 (eight) hours as needed. 100 each 12  . famotidine (PEPCID) 20 MG tablet Take 1 tablet (20 mg total) by mouth 2 (two) times daily. 60 tablet 3   No current facility-administered medications for this visit.     Allergies:   Pork-derived products    Social History:  The patient  reports that she has been smoking cigarettes. She has a 30.00 pack-year smoking history. She has never used smokeless tobacco. She reports current alcohol use. She reports that she does not use drugs.   Family History:  The patient's family history includes Cancer in her mother; HIV/AIDS in her brother; Hypertension in her mother; Multiple sclerosis in her daughter; Other in her father.    ROS:  General:no colds or fevers, no weight changes Skin:no rashes or ulcers HEENT:no blurred vision, no congestion CV:see HPI PUL:see HPI GI:no diarrhea constipation or melena, no indigestion GU:no hematuria, no dysuria MS:no joint pain, no claudication Neuro:no syncope, no lightheadedness Endo: borderline diabetes, no thyroid disease  Wt Readings from Last 3 Encounters:  09/07/18 267 lb 1.9 oz (121.2 kg)  08/28/18 265 lb 12.8 oz (120.6 kg)  08/23/18 265 lb 3.4 oz (120.3 kg)     PHYSICAL EXAM: VS:  BP (!) 144/90   Pulse 73   Ht _0  (1.626 m)   Wt 267 lb 1.9 oz (121.2 kg)   LMP 04/21/2014   BMI 45.85 kg/m  , BMI Body mass index is 45.85 kg/m. General:Pleasant affect, NAD Skin:Warm and dry, brisk capillary refill HEENT:normocephalic, sclera clear, mucus membranes moist Neck:supple, no JVD, no bruits  Heart:S1S2 RRR without murmur, gallup, rub or click Lungs:clear without rales, rhonchi, or wheezes TZG:YFVC, non tender, + BS, do not palpate liver spleen or masses Ext:no lower ext edema, 2+ pedal pulses, 2+ radial pulses Neuro:alert and oriented X 3, MAE, follows commands, + facial symmetry    EKG:  EKG is ordered today. The ekg ordered today demonstrates SR Rt atrial enlargement, non specific T  wave abnormality no acute changes.    Recent Labs: 08/21/2018: ALT 16; TSH 1.673 08/23/2018: BUN 9; Creatinine, Ser 1.20; Hemoglobin 14.0; Magnesium 2.0; Platelets 198; Potassium 3.7; Sodium 138    Lipid Panel    Component Value Date/Time   CHOL 129 08/21/2018 1442   CHOL 154 04/04/2017 1052   TRIG 30 08/21/2018 1442   HDL 42 08/21/2018 1442   HDL 53 04/04/2017 1052   CHOLHDL 3.1 08/21/2018 1442   VLDL 6 08/21/2018 1442   LDLCALC 81 08/21/2018 1442  [place] 91 04/04/2017 1052       Other studies Reviewed: Additional studies/ records that were reviewed today include: .  Cardiac cath 08/22/18  Ost Ramus to Ramus lesion is 90% stenosed.  A drug-eluting stent was successfully placed using a STENT SYNERGY DES 2.75X16.  Post intervention, there is a 0% residual stenosis.   1. 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 Mild nonobstructive dz of the LAD, circumflex, and RCA 3. Normal LV function by echo  Echo 08/22/18 Study Conclusions  - Left ventricle: The cavity size was normal. Systolic function was   normal. The estimated ejection fraction was in the range of 60%   to 65%. Wall motion was normal; there were no regional wall   motion abnormalities. Doppler parameters are consistent with   abnormal left ventricular relaxation (grade 1 diastolic   dysfunction).  Impressions:  - Compared to the prior study, there has been no significant   interval change.  ASSESSMENT AND PLAN:  1.  NSTEMI  With stent placed to ramus intermedius DES.  On asa and plavix, continue and statin.  Follow up with Dr. Tamala Julian in 3 months.    2.  CAD as above.  3.  HTN controlled, though has not had meds today when will come back in 2 weeks for nurse BP check after she had meds.    4.  HLD continue statin and recheck Hepatic and lipids in 8 weeks.   5.  MS and Fibromyalgia per PCP.     Current  medicines are reviewed with the patient today.  The patient Has no concerns regarding medicines.  The following changes have been made:  See above Labs/ tests ordered today include:see above  Disposition:   FU:  see above  Signed, Cecilie Kicks, NP  09/07/2018 12:20 PM    [address] [place], [place], [place] Phone: [phone]; Fax: [phone]

## 2018-09-06 ENCOUNTER — Encounter: Payer: Self-pay | Admitting: Cardiology

## 2018-09-07 ENCOUNTER — Encounter: Payer: Self-pay | Admitting: Cardiology

## 2018-09-07 ENCOUNTER — Ambulatory Visit (INDEPENDENT_AMBULATORY_CARE_PROVIDER_SITE_OTHER): Payer: Medicare Other | Admitting: Cardiology

## 2018-09-07 VITALS — BP 144/90 | HR 73 | Ht 64.0 in | Wt 267.1 lb

## 2018-09-07 DIAGNOSIS — I10 Essential (primary) hypertension: Secondary | ICD-10-CM | POA: Diagnosis not present

## 2018-09-07 DIAGNOSIS — I214 Non-ST elevation (NSTEMI) myocardial infarction: Secondary | ICD-10-CM

## 2018-09-07 DIAGNOSIS — E781 Pure hyperglyceridemia: Secondary | ICD-10-CM

## 2018-09-07 DIAGNOSIS — Z79899 Other long term (current) drug therapy: Secondary | ICD-10-CM | POA: Diagnosis not present

## 2018-09-07 DIAGNOSIS — I2510 Atherosclerotic heart disease of native coronary artery without angina pectoris: Secondary | ICD-10-CM | POA: Diagnosis not present

## 2018-09-07 LAB — BASIC METABOLIC PANEL
BUN/Creatinine Ratio: 11 (ref 9–23)
BUN: 13 mg/dL (ref 6–24)
CO2: 23 mmol/L (ref 20–29)
Calcium: 9.6 mg/dL (ref 8.7–10.2)
Chloride: 100 mmol/L (ref 96–106)
Creatinine, Ser: 1.17 mg/dL — ABNORMAL HIGH (ref 0.57–1.00)
GFR calc Af Amer: 59 mL/min/{1.73_m2} — ABNORMAL LOW (ref >59)
GFR calc non Af Amer: 51 mL/min/{1.73_m2} — ABNORMAL LOW (ref >59)
Glucose: 114 mg/dL — ABNORMAL HIGH (ref 65–99)
Potassium: 4.2 mmol/L (ref 3.5–5.2)
Sodium: 140 mmol/L (ref 134–144)

## 2018-09-07 MED ORDER — FAMOTIDINE 20 MG PO TABS: 20.0000 mg | ORAL_TABLET | Freq: Two times a day (BID) | ORAL | 3 refills | Status: DC

## 2018-09-07 NOTE — Patient Instructions (Addendum)
Medication Instructions:  Your physician has recommended you make the following change in your medication: 1.  START Pepcid 20 mg taking 1 tablet twice a day  If you need a refill on your cardiac medications before your next appointment, please call your pharmacy.   Lab work: TODAY: BMET 6 WEEKS:  FASTING LIPID & LFT  If you have labs (blood work) drawn today and your tests are completely normal, you will receive your results only by: Marland Kitchen MyChart Message (if you have MyChart) OR . A paper copy in the mail If you have any lab test that is abnormal or we need to change your treatment, we will call you to review the results.  Testing/Procedures: None ordered  Follow-Up: Your physician recommends that you schedule a follow-up appointment in: 09/19/18 FOR A NURSE VISIT TO CHECK YOUR BLOOD PRESSURE.  PLEASE BE SURE TO TAKE YOUR MEDICATIONS THAT MORNING.   Your physician recommends that you schedule a follow-up appointment in: [place]. Tamala Julian .   Any Other Special Instructions Will Be Listed Below (If Applicable).

## 2018-09-14 ENCOUNTER — Other Ambulatory Visit: Payer: Self-pay | Admitting: Internal Medicine

## 2018-09-14 DIAGNOSIS — M654 Radial styloid tenosynovitis [de Quervain]: Secondary | ICD-10-CM

## 2018-09-18 ENCOUNTER — Encounter (HOSPITAL_COMMUNITY): Payer: Self-pay

## 2018-09-18 NOTE — Telephone Encounter (Signed)
Attempted to call patient in regards to Cardiac Rehab - unable to leave voicemail  Mailed letter

## 2018-09-19 ENCOUNTER — Ambulatory Visit: Payer: Medicare Other

## 2018-09-20 ENCOUNTER — Ambulatory Visit (INDEPENDENT_AMBULATORY_CARE_PROVIDER_SITE_OTHER): Payer: Medicare Other

## 2018-09-20 VITALS — BP 134/70 | HR 76 | Ht 64.0 in | Wt 266.0 lb

## 2018-09-20 DIAGNOSIS — I10 Essential (primary) hypertension: Secondary | ICD-10-CM | POA: Diagnosis not present

## 2018-09-20 NOTE — Progress Notes (Signed)
1.) Reason for visit: BP check for HTN  2.) Name of MD requesting visit: Cecilie Kicks, NP  3.) H&P: NSTEMI, HTN, CAD, HLD  4.) ROS related to problem: Patient was seen by Cecilie Kicks, NP on 09/07/18 and BP was elevated at 144/90 but had not had her AM meds. Patient was to return for Nurse Visit BP check.   5.) Assessment and plan per MD: Patient's BP 134/70, HR 76. Patient states that she took all of her BP meds this morning. She states that she does have left lateral wall chest discomfort with exertion but nothing similar to her prior attack. Patient also c/o SOB on exertion but states that this is nothing different for her either. Denies any other Sx. Patient has not started Pepcid yet but will be soon. Reviewed with Cecilie Kicks, NP and patient is to continue current medicines. No changes needed at this time. Patient scheduled for f/u with Dr. Tamala Julian on 12/10/18.

## 2018-09-27 ENCOUNTER — Other Ambulatory Visit: Payer: Self-pay | Admitting: Internal Medicine

## 2018-09-27 DIAGNOSIS — G35 Multiple sclerosis: Secondary | ICD-10-CM

## 2018-10-01 ENCOUNTER — Telehealth (HOSPITAL_COMMUNITY): Payer: Self-pay

## 2018-10-01 NOTE — Telephone Encounter (Signed)
Attempted to contact pt in regards to CR, unable to leave pt a voicemail.

## 2018-10-17 ENCOUNTER — Other Ambulatory Visit: Payer: Medicare Other

## 2018-10-18 NOTE — Progress Notes (Deleted)
Follow-up Visit   Date: 10/18/18   Tiffany Brady MRN: [id number] DOB: 04-23-1961   Interim History: Tiffany Brady is a 58 y.o. right-handed African American female with chronic pain syndrome, fibromyaglia, hypertension, and tobacco use returning to the clinic for follow-up of multiple sclerosis.  The patient was accompanied to the clinic by significant other, Mr. Mariea Clonts.***  History of present illness: Starting in July 2016, she began experiencing numbness/tingling over the tips of her fingers on both hands which has steadily involved the whole hand. She has multiple other symptoms including intermittent numbness over the right leg above the knees, pain related weakness and has difficulty with walking, whole body pain described as soreness which is tender to palpation. She has a diagnosis of fibromyalgia and takes Cymbalta which helps alleviate the pain where she can function, but it is still always there. Her ANA, RF, and TSH is normal. In October 2016, presented to her PCP's office with generalized weakness, gait difficulty, bowel and bladder incontinenced and concerned about MS due to her daughter diagnosed with this.  She underwent MRI brain which showed white matter changes involving the periventricular region and subcortical white matter, concerning for MS.  Although initial imaging also suggested cervical cord compression, subsequent imaging of her cervical spine only showed multilevel spinal stenosis without cord involvement.  She underwent CSF testing which showed normal IgG index, but there was increased oligoclonal bands both in the CSF and serum, but moreso apparent in the CSF.   She started gabapentin 319m BID but had not noticed any changes in her left arm.  She is not using wrist splints due to expense.   In early 2017, she was treated with 3-days of IVMP, and did not notice any difference in any of her symptoms.  She continues to complain of a lot of myalgias and  generalized pain throughout (knees, hips, elbows).  She does not see pain management and PCP manages her fibromyalgia.    UPDATE 04/19/2017:   She stopped all her medications about a month ago because of being stressed with her medications.  Around 2 months ago, she started having memory changes, speech difficulty described as stuttering and word-finding difficulty.  During the same time, [place] was extremely stressed [place], whom [place].  [place] who [place] MRI brain, but she did not follow-up and is unsure of the results or the providers name.  [place], which has improved her state of mind and is now interested in restarting her medications.  She continues to have neck pain and whole-body pain due to fibromyalgia.   UPDATE 01/19/2018:   MRI brain was ordered at her last visit in January, but she did not follow through with this because she was not contacted with an appointment.  She continues to have generalized of the arm and legs from fibromyalgia.  She still has dizzy spells which lasts about 1 minute and improved by resting.    UPDATE 10/18/2018:  She is here for 4 month follow-up visit.    Medications:  Current Outpatient Medications on File Prior to Visit  Medication Sig Dispense Refill  . amLODipine (NORVASC) 5 MG tablet Take 1 tablet (5 mg total) by mouth daily. 30 tablet 6  . aspirin EC 81 MG tablet Take 1 tablet (81  mg total) by mouth daily. 100 tablet 1  . atorvastatin (LIPITOR) 80 MG tablet Take 1 tablet (80 mg total) by mouth daily at 6 PM. 30 tablet 6  . Blood Glucose Monitoring Suppl (TRUE METRIX GO GLUCOSE METER) w/Device KIT 1 each by Does not apply route every 8 (eight) hours as needed. 1 kit 0  . cholecalciferol (VITAMIN D3) 25 MCG (1000 UT) tablet Take 5,000 Units by mouth daily.    .  clopidogrel (PLAVIX) 75 MG tablet Take 1 tablet (75 mg total) by mouth daily. 30 tablet 11  . cyclobenzaprine (FLEXERIL) 10 MG tablet TAKE ONE TABLET BY MOUTH TWICE A DAY AS NEEDED FOR MUSCLE SPASMS 40 tablet 1  . diclofenac sodium (VOLTAREN) 1 % GEL APPLY TWO GRAMS TOPICALLY FOUR TIMES A DAY 100 g 2  . Dimethyl Fumarate (TECFIDERA) 240 MG CPDR Take 1 capsule (240 mg total) by mouth 2 (two) times daily. 180 capsule 3  . DULoxetine (CYMBALTA) 30 MG capsule Take 1 capsule (30 mg total) by mouth daily. 30 capsule 5  . famotidine (PEPCID) 20 MG tablet Take 1 tablet (20 mg total) by mouth 2 (two) times daily. (Patient not taking: Reported on 09/20/2018) 60 tablet 3  . fluticasone (FLONASE) 50 MCG/ACT nasal spray PLACE ONE SPRAY INTO BOTH NOSTRILS DAILY AS NEEDED FOR ALLERGIES OR RHINITIS 16 g 2  . glucose blood (TRUE METRIX BLOOD GLUCOSE TEST) test strip Use as instructed 100 each 12  . losartan (COZAAR) 25 MG tablet Take 1 tablet (25 mg total) by mouth daily. 30 tablet 6  . metoprolol tartrate (LOPRESSOR) 25 MG tablet Take 1 tablet (25 mg total) by mouth 2 (two) times daily. 60 tablet 6  . nitroGLYCERIN (NITROSTAT) 0.4 MG SL tablet Place 1 tablet (0.4 mg total) under the tongue every 5 (five) minutes x 3 doses as needed for chest pain. 25 tablet 12  . oxybutynin (DITROPAN) 5 MG tablet TAKE ONE TABLET BY MOUTH TWICE A DAY 60 tablet 3  . pregabalin (LYRICA) 25 MG capsule Take 1 capsule (25 mg total) by mouth 2 (two) times daily. (Patient not taking: Reported on 09/20/2018) 60 capsule 2  . TRUEPLUS LANCETS 26G MISC 1 each by Does not apply route every 8 (eight) hours as needed. 100 each 12   No current facility-administered medications on file prior to visit.     Allergies:  Allergies  Allergen Reactions  . Pork-Derived Products Hives    Review of Systems:  CONSTITUTIONAL: No fevers, chills, night sweats, or weight loss.  EYES: No visual changes or eye pain ENT: No hearing changes.  No history of  nose bleeds.   RESPIRATORY: No cough, wheezing and shortness of breath.   CARDIOVASCULAR: Negative for chest pain, and palpitations.   GI: Negative for abdominal discomfort, blood in stools or black stools.  No recent change in bowel habits.   GU:  No history of incontinence.   MUSCLOSKELETAL: +history of joint pain or swelling.  +myalgias.   SKIN: Negative for lesions, rash, and itching.   ENDOCRINE: Negative for cold or heat intolerance, polydipsia or goiter.   PSYCH:  No depression or anxiety symptoms.   NEURO: As Above.   Vital Signs:  LMP 04/21/2014   General Medical Exam:   General:  Well appearing, comfortable  Eyes/ENT: see cranial nerve examination.   Neck: No masses appreciated.  Full range of motion without tenderness.  No carotid bruits. Respiratory:  Clear to auscultation, good air entry bilaterally.   Cardiac:  Regular rate and rhythm, no murmur.   Ext:  Generalized tenderness of the muscles to light palpation  Neurological Exam: MENTAL STATUS including orientation to time, place, person, recent and remote memory, attention span and concentration, language, and fund of knowledge is normal.  Speech is not dysarthric.  CRANIAL NERVES: No visual field defects. Pupils equal round and reactive to light.  Normal conjugate, extra-ocular eye movements in all directions of gaze. There is fatigable nystagmus to the right.  No ptosis.  Face is symmetric. Palate elevates symmetrically.  Tongue is midline.  MOTOR:  Motor strength is 5/5 in all extremities.  No pronator drift.  Tone is normal.    MSRs:  Reflexes are 3+/4 throughout.  SENSORY:  Intact to vibration, pin prick, and temperature.  COORDINATION/GAIT:  Normal finger to nose.  Finger tapping is inconsistently slowed (effort).  Gait is wide-based, nonphysiological with shakiness of the legs and appearance of buckling, assisted with walker   Data: Records received from [facility] in [place]  MRI  brain with and without contrast 10/26/2016: Extensive periventricular hyperintensity which demonstrate distribution and morphology consistent with multiple sclerosis. There is no evidence of enhancement to suggest acute demyelination.  MRI cervical spine wwo contrast 07/31/2015: Multilevel degenerative change in the cervical spine with mild spinal stenosis at C3-4, C4-5, C5-6. Left foraminal narrowing at C5-6 due to disc and osteophyte.  Spinal cord appears normal without cord lesion or abnormal enhancement. No cord compression.  MRI brain wwo contrast 07/24/2015: Extensive and premature for age white matter signal abnormality strongly suspicious for multiple sclerosis. Chronic microvascular ischemic changes are not excluded, but are less favored.  No restricted diffusion or abnormal postcontrast enhancement at any location, but specifically in association with the white matter, to suggest an acute process.  Cervical spondylosis with suspected central disc protrusion/extrusion at C4-5 resulting in cord compression. This could contribute to numbness and pain in the extremities. Correlate clinically for radiculopathy or myelopathy. MRI cervical spine may be helpful in further evaluation as clinically indicated.  EMG of the upper extremities 05/26/2015: 1. Bilateral median neuropathy at or distal to the wrist, consistent with clinical diagnosis of carpal tunnel syndrome. Overall, these findings are mild in degree electrically and worse on the right. 2. There is no evidence of a cervical radiculopathy or diffuse myopathy affecting upper extremities.  MRI brain wwo contrast 02/01/2018: Numerous small periventricular white matter hyperintensities are stable since 2016 and are consistent with the clinical diagnosis of multiple sclerosis. No active lesions identified. Interval development of chronic microhemorrhage in the left thalamus and left occipital lobe, question hypertension  Labs 09/29/2014: RF  12, ANA neg, ESR 23  CSF testing 08/14/2015:  R34 W0 P37 G67, IgG index 2.8, MPB < 2.0, OCB  >5 well defined gamma restriction bands that are also present in the patient's corresponding serum sample, but some bands in the CSF are more prominent   IMPRESSION/PLAN: 1. Relapsing remitting multiple sclerosis based on white matter changes on MRI brain and presence of oligoclonal bands in the CSF.  She was initially given 3-days Solumedrol without relief of whole body pain, suggesting that her whole body pain is not due to multiple sclerosis and most likely secondary to fibromyalgia.  She has been on Tecfidera since 2017 and is tolerating this.  She is getting financial assistance. MRI brain from May 2019 was viewed and is stable as compared to 2017 with stable periventricular white matter changes, no active lesions.  There is an interval  left thalamic and left occipital microhemorrhage, likely hypertensive. *** .  - Continue Tecfidera 293m twice daily.  - Continue vitamin D 4000 units  - Check CBC and CMP today and every 3 months.   2.  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  BP is well controlled.  I have strongly urged her to quit smoking to control risk factors for future events.    3.  Gait abnormality with brisk reflexes.  She did not follow-up with MRI thoracic and lumbar spine.    4.  Vitamin D deficiency, continue 1000 units daily.  5.  Dizziness, unclear etiology.    6.  Generalized myalgias due to fibromyalgia, followed by PCP  Return to clinic in 4 months.   Thank you for allowing me to participate in patient's care.  If I can answer any additional questions, I would be pleased to do so.    Sincerely,    Shi Blankenship K. PPosey Pronto DO

## 2018-10-19 ENCOUNTER — Ambulatory Visit: Payer: Medicare Other | Admitting: Neurology

## 2018-10-19 ENCOUNTER — Telehealth (HOSPITAL_COMMUNITY): Payer: Self-pay

## 2018-10-19 NOTE — Telephone Encounter (Signed)
Attempted to contact pt, issue with pt phone. Unable to leave voicemail.

## 2018-10-25 ENCOUNTER — Ambulatory Visit: Payer: Medicare Other | Admitting: Internal Medicine

## 2018-10-30 NOTE — Telephone Encounter (Signed)
No response from pt.. closed referral. Tedra [place]. Support Rep II

## 2018-11-05 ENCOUNTER — Other Ambulatory Visit: Payer: Self-pay | Admitting: Internal Medicine

## 2018-11-05 DIAGNOSIS — J301 Allergic rhinitis due to pollen: Secondary | ICD-10-CM

## 2018-11-08 NOTE — Progress Notes (Deleted)
Follow-up Visit   Date: 11/08/18   Tiffany Brady MRN: [id number] DOB: 03/16/1961   Interim History: Tiffany Brady is a 58 y.o. right-handed African American female with chronic pain syndrome, fibromyaglia, hypertension, and tobacco use returning to the clinic for follow-up of multiple sclerosis.  The patient was accompanied to the clinic by significant other, Mr. Tiffany Brady.  History of present illness: Starting in July 2016, she began experiencing numbness/tingling over the tips of her fingers on both hands which has steadily involved the whole hand. She has multiple other symptoms including intermittent numbness over the right leg above the knees, pain related weakness and has difficulty with walking, whole body pain described as soreness which is tender to palpation. She has a diagnosis of fibromyalgia and takes Cymbalta which helps alleviate the pain where she can function, but it is still always there. Her ANA, RF, and TSH is normal. In October 2016, presented to her PCP's office with generalized weakness, gait difficulty, bowel and bladder incontinenced and concerned about MS due to her daughter diagnosed with this.  She underwent MRI brain which showed white matter changes involving the periventricular region and subcortical white matter, concerning for MS.  Although initial imaging also suggested cervical cord compression, subsequent imaging of her cervical spine only showed multilevel spinal stenosis without cord involvement.  She underwent CSF testing which showed normal IgG index, but there was increased oligoclonal bands both in the CSF and serum, but moreso apparent in the CSF.   She started gabapentin 343m BID but had not noticed any changes in her left arm.  She is not using wrist splints due to expense.   She has urge incontinence for the past 15 year, which is triggered by sneezing, coughing, or laughing. She does not have a regular OBGYN provider.  In early 2017, she was  treated with 3-days of IVMP, and did not notice any difference in any of her symptoms.  She continues to complain of a lot of myalgias and generalized pain throughout (knees, hips, elbows).  She does not see pain management and PCP manages her fibromyalgia.    UPDATE 04/19/2017:   She stopped all her medications about a month ago because of being stressed with her medications.  Around 2 months ago, she started having memory changes, speech difficulty described as stuttering and word-finding difficulty.  During the same time, [place] was extremely stressed [place] [facility] [place] [facility] [place] [facility] [place] of having dispute with her niece, whom [place].  [place] who [place] MRI brain, but she did not follow-up and is unsure of the results or the providers name.  [place], which has improved her state of mind and is now interested in restarting her medications.  She continues to have neck pain and whole-body pain due to fibromyalgia.   UPDATE 01/19/2018:   MRI brain was ordered at her last visit in January, but she did not follow through with this because she was not contacted with an appointment.  She continues to have generalized of the arm and legs from fibromyalgia.  She still has dizzy spells which lasts about 1 minute and improved by resting.  She does not have spinning sensation, nausea, or vomiting. She saw ENT who did not find inner ear pathology.  She also complains of bilateral hip and heel pain.   UPDATE 11/09/2018:  At  her lat visit, she complained of gait abnormality and MRI lumbar and thoracic spine was ordered to assess for structural changes, however, this was not completed.  /  Medications:  Current Outpatient Medications on File Prior to Visit  Medication Sig Dispense Refill  . amLODipine (NORVASC) 5 MG tablet Take 1 tablet (5 mg total) by mouth daily. 30 tablet 6  .  aspirin EC 81 MG tablet Take 1 tablet (81 mg total) by mouth daily. 100 tablet 1  . atorvastatin (LIPITOR) 80 MG tablet Take 1 tablet (80 mg total) by mouth daily at 6 PM. 30 tablet 6  . Blood Glucose Monitoring Suppl (TRUE METRIX GO GLUCOSE METER) w/Device KIT 1 each by Does not apply route every 8 (eight) hours as needed. 1 kit 0  . cholecalciferol (VITAMIN D3) 25 MCG (1000 UT) tablet Take 5,000 Units by mouth daily.    . clopidogrel (PLAVIX) 75 MG tablet Take 1 tablet (75 mg total) by mouth daily. 30 tablet 11  . cyclobenzaprine (FLEXERIL) 10 MG tablet TAKE ONE TABLET BY MOUTH TWICE A DAY AS NEEDED FOR MUSCLE SPASMS 40 tablet 1  . diclofenac sodium (VOLTAREN) 1 % GEL APPLY TWO GRAMS TOPICALLY FOUR TIMES A DAY 100 g 2  . Dimethyl Fumarate (TECFIDERA) 240 MG CPDR Take 1 capsule (240 mg total) by mouth 2 (two) times daily. 180 capsule 3  . DULoxetine (CYMBALTA) 30 MG capsule Take 1 capsule (30 mg total) by mouth daily. 30 capsule 5  . famotidine (PEPCID) 20 MG tablet Take 1 tablet (20 mg total) by mouth 2 (two) times daily. (Patient not taking: Reported on 09/20/2018) 60 tablet 3  . fluticasone (FLONASE) 50 MCG/ACT nasal spray PLACE ONE SPRAY INTO BOTH NOSTRILS DAILY AS NEEDED FOR ALLERGIES OR RHINITIS 16 g 2  . glucose blood (TRUE METRIX BLOOD GLUCOSE TEST) test strip Use as instructed 100 each 12  . losartan (COZAAR) 25 MG tablet Take 1 tablet (25 mg total) by mouth daily. 30 tablet 6  . metoprolol tartrate (LOPRESSOR) 25 MG tablet Take 1 tablet (25 mg total) by mouth 2 (two) times daily. 60 tablet 6  . nitroGLYCERIN (NITROSTAT) 0.4 MG SL tablet Place 1 tablet (0.4 mg total) under the tongue every 5 (five) minutes x 3 doses as needed for chest pain. 25 tablet 12  . oxybutynin (DITROPAN) 5 MG tablet TAKE ONE TABLET BY MOUTH TWICE A DAY 60 tablet 3  . pregabalin (LYRICA) 25 MG capsule Take 1 capsule (25 mg total) by mouth 2 (two) times daily. (Patient not taking: Reported on 09/20/2018) 60 capsule 2  .  TRUEPLUS LANCETS 26G MISC 1 each by Does not apply route every 8 (eight) hours as needed. 100 each 12   No current facility-administered medications on file prior to visit.     Allergies:  Allergies  Allergen Reactions  . Pork-Derived Products Hives    Review of Systems:  CONSTITUTIONAL: No fevers, chills, night sweats, or weight loss.  EYES: No visual changes or eye pain ENT: No hearing changes.  No history of nose bleeds.   RESPIRATORY: No cough, wheezing and shortness of breath.   CARDIOVASCULAR: Negative for chest pain, and palpitations.   GI: Negative for abdominal discomfort, blood in stools or black stools.  No recent change in bowel habits.   GU:  No history of incontinence.   MUSCLOSKELETAL: +history of joint pain or swelling.  +myalgias.   SKIN: Negative for lesions, rash, and itching.   ENDOCRINE: Negative for cold  or heat intolerance, polydipsia or goiter.   PSYCH:  No depression or anxiety symptoms.   NEURO: As Above.   Vital Signs:  LMP 04/21/2014   General Medical Exam:   General:  Well appearing, comfortable  Eyes/ENT: see cranial nerve examination.   Neck: No masses appreciated.  Full range of motion without tenderness.  No carotid bruits. Respiratory:  Clear to auscultation, good air entry bilaterally.   Cardiac:  Regular rate and rhythm, no murmur.   Ext:  Generalized tenderness of the muscles to light palpation  Neurological Exam: MENTAL STATUS including orientation to time, place, person, recent and remote memory, attention span and concentration, language, and fund of knowledge is normal.  Speech is not dysarthric.  CRANIAL NERVES: No visual field defects. Pupils equal round and reactive to light.  Normal conjugate, extra-ocular eye movements in all directions of gaze. There is fatigable nystagmus to the right.  No ptosis.  Face is symmetric. Palate elevates symmetrically.  Tongue is midline.  MOTOR:  Motor strength is 5/5 in all extremities.  No  pronator drift.  Tone is normal.    MSRs:  Reflexes are 3+/4 throughout.  SENSORY:  Intact to vibration, pin prick, and temperature.  COORDINATION/GAIT:  Normal finger to nose.  Finger tapping is inconsistently slowed (effort).  Gait is wide-based, nonphysiological with shakiness of the legs and appearance of buckling, assisted with walker   Data: Records received from [facility] in [place]  MRI brain with and without contrast 10/26/2016: Extensive periventricular hyperintensity which demonstrate distribution and morphology consistent with multiple sclerosis. There is no evidence of enhancement to suggest acute demyelination.  MRI cervical spine wwo contrast 07/31/2015: Multilevel degenerative change in the cervical spine with mild spinal stenosis at C3-4, C4-5, C5-6. Left foraminal narrowing at C5-6 due to disc and osteophyte.  Spinal cord appears normal without cord lesion or abnormal enhancement. No cord compression.  MRI brain wwo contrast 07/24/2015: Extensive and premature for age white matter signal abnormality strongly suspicious for multiple sclerosis. Chronic microvascular ischemic changes are not excluded, but are less favored.  No restricted diffusion or abnormal postcontrast enhancement at any location, but specifically in association with the white matter, to suggest an acute process.  Cervical spondylosis with suspected central disc protrusion/extrusion at C4-5 resulting in cord compression. This could contribute to numbness and pain in the extremities. Correlate clinically for radiculopathy or myelopathy. MRI cervical spine may be helpful in further evaluation as clinically indicated.  EMG of the upper extremities 05/26/2015: 1. Bilateral median neuropathy at or distal to the wrist, consistent with clinical diagnosis of carpal tunnel syndrome. Overall, these findings are mild in degree electrically and worse on the right. 2. There is no evidence of a  cervical radiculopathy or diffuse myopathy affecting upper extremities.  MRI brain wwo contrast 02/01/2018: Numerous small periventricular white matter hyperintensities are stable since 2016 and are consistent with the clinical diagnosis of multiple sclerosis. No active lesions identified. Interval development of chronic microhemorrhage in the left thalamus and left occipital lobe, question hypertension  Labs 09/29/2014: RF 12, ANA neg, ESR 23  CSF testing 08/14/2015:  R34 W0 P37 G67, IgG index 2.8, MPB < 2.0, OCB  >5 well defined gamma restriction bands that are also present in the patient's corresponding serum sample, but some bands in the CSF are more prominent    IMPRESSION/PLAN: 1. Relapsing remitting multiple sclerosis based on white matter changes on MRI brain and presence of oligoclonal bands in the CSF.  She was initially given 3-days Solumedrol without relief of whole body pain, suggesting that her whole body pain is not due to multiple sclerosis and most likely secondary to fibromyalgia.  She has been on Tecfidera since 2017 and is tolerating this.  She is getting financial assistance. MRI brain from May 2019 was viewed and is stable as compared to 2017 with stable periventricular white matter changes, no active lesions.  There is an interval left thalamic and left occipital microhemorrhage, likely hypertensive.  BP is well controlled.  - Continue Tecfidera 230m twice daily.  - Continue vitamin D 4000 units  - MRI brain wwo contrast in May 2020  - Check CBC and CMP today and every 3 months.   2.  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  BP is well controlled.  I have strongly urged her to quit smoking to control risk factors for future events.    3.  Gait abnormality with brisk reflexes.  Check MRI lumbar spine wo for canal stenosis and MRI thoracic spine wwo contrast for spinal cord lesion.  Start  physical therapy.  Prescription given for 4 prong cane.   ***  5.  Dizziness, unclear etiology.  No cerebellar lesions or ENT pathology.  6.  Fibromyalgia, followed by PCP  Return to clinic in 6 months ***  Greater than 50% of this *** minute visit was spent in counseling, explanation of diagnosis, planning of further management, and coordination of care ***due to the nature of their ***.    Thank you for allowing me to participate in patient's care.  If I can answer any additional questions, I would be pleased to do so.    Sincerely,     K. PPosey Pronto DO

## 2018-11-09 ENCOUNTER — Ambulatory Visit: Payer: Medicare Other | Admitting: Neurology

## 2018-11-23 ENCOUNTER — Other Ambulatory Visit: Payer: Self-pay | Admitting: Internal Medicine

## 2018-11-23 DIAGNOSIS — I10 Essential (primary) hypertension: Secondary | ICD-10-CM

## 2018-11-28 ENCOUNTER — Other Ambulatory Visit: Payer: Self-pay | Admitting: Internal Medicine

## 2018-11-28 DIAGNOSIS — M654 Radial styloid tenosynovitis [de Quervain]: Secondary | ICD-10-CM

## 2018-11-29 ENCOUNTER — Ambulatory Visit: Payer: Medicare Other | Admitting: Internal Medicine

## 2018-12-06 ENCOUNTER — Telehealth: Payer: Self-pay | Admitting: *Deleted

## 2018-12-06 DIAGNOSIS — I10 Essential (primary) hypertension: Secondary | ICD-10-CM

## 2018-12-06 NOTE — Telephone Encounter (Signed)
-----  Message from Belva Crome, MD sent at 12/06/2018  2:31 PM EDT ----- Regarding: Office visit She could have a phone or telemedicine visit.  I do not believe she needs to come to the office. Most pressing issue is that I do not see lipids or a liver panel since being placed on high intensity statin therapy.  She will need to have a lipid panel and C met done soon

## 2018-12-06 NOTE — Telephone Encounter (Signed)
Spoke with Tiffany Brady and she is agreeable to a telephone visit with Dr. Tamala Julian on Monday.  Tiffany Brady agreeable to be around her phone at 9:20A at the time of her appt.  She is going to try to come to the office tomorrow to have her labs drawn.  Tiffany Brady will call if unable to come for labs.

## 2018-12-07 ENCOUNTER — Other Ambulatory Visit: Payer: Medicaid Other

## 2018-12-10 ENCOUNTER — Ambulatory Visit: Payer: Medicare Other | Admitting: Neurology

## 2018-12-10 ENCOUNTER — Encounter: Payer: Self-pay | Admitting: Interventional Cardiology

## 2018-12-10 ENCOUNTER — Telehealth (INDEPENDENT_AMBULATORY_CARE_PROVIDER_SITE_OTHER): Payer: Medicare Other | Admitting: Interventional Cardiology

## 2018-12-10 VITALS — Ht 64.0 in | Wt 262.0 lb

## 2018-12-10 DIAGNOSIS — I10 Essential (primary) hypertension: Secondary | ICD-10-CM | POA: Diagnosis not present

## 2018-12-10 DIAGNOSIS — I2510 Atherosclerotic heart disease of native coronary artery without angina pectoris: Secondary | ICD-10-CM | POA: Diagnosis not present

## 2018-12-10 DIAGNOSIS — R0683 Snoring: Secondary | ICD-10-CM | POA: Insufficient documentation

## 2018-12-10 DIAGNOSIS — Z720 Tobacco use: Secondary | ICD-10-CM | POA: Diagnosis not present

## 2018-12-10 DIAGNOSIS — E781 Pure hyperglyceridemia: Secondary | ICD-10-CM | POA: Diagnosis not present

## 2018-12-10 DIAGNOSIS — I214 Non-ST elevation (NSTEMI) myocardial infarction: Secondary | ICD-10-CM

## 2018-12-10 NOTE — Telephone Encounter (Signed)
Virtual Visit Pre-Appointment Phone Call  Steps For Call:  1. Confirm consent - "In the setting of the current Covid19 crisis, you are scheduled for a (phone or video) visit with your provider on (date) at (time).  Just as we do with many in-office visits, in order for you to participate in this visit, we must obtain consent.  If you'd like, I can send this to your mychart (if signed up) or email for you to review.  Otherwise, I can obtain your verbal consent now.  All virtual visits are billed to your insurance company just like a normal visit would be.  By agreeing to a virtual visit, we'd like you to understand that the technology does not allow for your provider to perform an examination, and thus may limit your provider's ability to fully assess your condition.  Finally, though the technology is pretty good, we cannot assure that it will always work on either your or our end, and in the setting of a video visit, we may have to convert it to a phone-only visit.  In either situation, we cannot ensure that we have a secure connection.  Are you willing to proceed?"  2. Give patient instructions for WebEx download to smartphone as below if video visit  3. Advise patient to be prepared with any vital sign or heart rhythm information, their current medicines, and a piece of paper and pen handy for any instructions they may receive the day of their visit  4. Inform patient they will receive a phone call 15 minutes prior to their appointment time (may be from unknown caller ID) so they should be prepared to answer  5. Confirm that appointment type is correct in Epic appointment notes (video vs telephone)    TELEPHONE CALL NOTE  Tiffany Brady has been deemed a candidate for a follow-up tele-health visit to limit community exposure during the Covid-19 pandemic. I spoke with the patient via phone to ensure availability of phone/video source, confirm preferred email & phone number, and discuss  instructions and expectations.  I reminded Tiffany Brady to be prepared with any vital sign and/or heart rhythm information that could potentially be obtained via home monitoring, at the time of her visit. I reminded Tiffany Brady to expect a phone call at the time of her visit if her visit.  Did the patient verbally acknowledge consent to treatment? Yes  Tiffany Brady, [facility] 12/10/2018 9:13 AM   DOWNLOADING THE [place]  - If Apple, go to [employer] and type in WebEx in the search bar. [place] [employer], the blue/green circle. The app is free but as with any other app downloads, their phone may require them to verify saved payment information or Apple password. The patient does NOT have to create an account.  - If Android, ask patient to go to [employer] and type in WebEx in the search bar. [place] [employer], the blue/green circle. The app is free but as with any other app downloads, their phone may require them to verify saved payment information or Android password. The patient does NOT have to create an account.   CONSENT FOR TELE-HEALTH VISIT - PLEASE REVIEW  I hereby voluntarily request, consent and authorize [facility] and its employed or contracted physicians, physician assistants, nurse practitioners or other licensed health care professionals (the Practitioner), to provide me with telemedicine health care services (the "Services") as deemed necessary by the treating Practitioner. I acknowledge and  consent to receive the Services by the Practitioner via telemedicine. I understand that the telemedicine visit will involve communicating with the Practitioner through live audiovisual communication technology and the disclosure of certain medical information by electronic transmission. I acknowledge that I have been given the opportunity to request an in-person assessment or other available alternative prior to the telemedicine visit and  am voluntarily participating in the telemedicine visit.  I understand that I have the right to withhold or withdraw my consent to the use of telemedicine in the course of my care at any time, without affecting my right to future care or treatment, and that the Practitioner or I may terminate the telemedicine visit at any time. I understand that I have the right to inspect all information obtained and/or recorded in the course of the telemedicine visit and may receive copies of available information for a reasonable fee.  I understand that some of the potential risks of receiving the Services via telemedicine include:  Marland Kitchen Delay or interruption in medical evaluation due to technological equipment failure or disruption; . Information transmitted may not be sufficient (e.g. poor resolution of images) to allow for appropriate medical decision making by the Practitioner; and/or  . In rare instances, security protocols could fail, causing a breach of personal health information.  Furthermore, I acknowledge that it is my responsibility to provide information about my medical history, conditions and care that is complete and accurate to the best of my ability. I acknowledge that Practitioner's advice, recommendations, and/or decision may be based on factors not within their control, such as incomplete or inaccurate data provided by me or distortions of diagnostic images or specimens that may result from electronic transmissions. I understand that the practice of medicine is not an exact science and that Practitioner makes no warranties or guarantees regarding treatment outcomes. I acknowledge that I will receive a copy of this consent concurrently upon execution via email to the email address I last provided but may also request a printed copy by calling the office of [place].    I understand that my insurance will be billed for this visit.   I have read or had this consent read to me. . I understand the  contents of this consent, which adequately explains the benefits and risks of the Services being provided via telemedicine.  . I have been provided ample opportunity to ask questions regarding this consent and the Services and have had my questions answered to my satisfaction. . I give my informed consent for the services to be provided through the use of telemedicine in my medical care  By participating in this telemedicine visit I agree to the above.

## 2018-12-10 NOTE — Progress Notes (Signed)
Virtual Visit via Telephone Note    Evaluation Performed:  Follow-up visit  This visit type was conducted due to national recommendations for restrictions regarding the COVID-19 Pandemic (e.g. social distancing).  This format is felt to be most appropriate for this patient at this time.  All issues noted in this document were discussed and addressed.  No physical exam was performed (except for noted visual exam findings with Video Visits).  Please refer to the patient's chart (MyChart message for video visits and phone note for telephone visits) for the patient's consent to telehealth for [facility].  Date:  12/10/2018   ID:  Tiffany Brady, DOB [birth year], MRN [id number]  Patient Location:  Home  Provider location:   Office  PCP:  Ladell Pier, MD  Cardiologist:  Sinclair Grooms, MD  Electrophysiologist:  None   Chief Complaint: Coronary artery disease follow-up  History of Present Illness:    Tiffany Brady is a 58 y.o. female who presents via audio conferencing for a telehealth visit today.    The patient has coronary artery disease, non-ST segment myocardial infarction August 21, 2018, prediabetes mellitus type II (hemoglobin A1c 6.1), mild hyperlipidemia with untreated LDL of 81 at the time of presentation, cigarette smoking, morbid obesity, essential hypertension, and gout.  Other significant medical issue is multiple sclerosis.  Since her office visit on September 07, 2018, she has done well.  She denies chest discomfort, orthopnea, PND, palpitations, syncope, edema, and need for nitroglycerin.  She has had no recurrence of symptoms similar to presentation.  She continues to smoke cigarettes.  She has not established a smoking cessation date.  She denies medication side effects.  She is on high intensity statin therapy.  Initial LDL was 81 prior to statin initiation.  She does not sleep well.  She states that she snores significantly.  Also complains of her  legs bother her.  Leg discomfort is been present for greater than a year.  It can be present with activity and also at rest.  The patient does not symptoms concerning for COVID-19 infection (fever, chills, cough, or new shortness of breath).    Prior CV studies:   The following studies were reviewed today:  Coronary angiography and PCI 08/22/2018: Diagnostic  Dominance: Right    Intervention       Past Medical History:  Diagnosis Date  . Chronic kidney disease    pt reported kidney failure  . Chronic pain syndrome   . Gout   . Hypertension   . MS (multiple sclerosis) ([place])   . Tobacco use    Past Surgical History:  Procedure Laterality Date  . FOOT SURGERY Right   . LEFT HEART CATH AND CORONARY ANGIOGRAPHY N/A 08/22/2018   Procedure: LEFT HEART CATH AND CORONARY ANGIOGRAPHY;  Surgeon: Sherren Mocha, MD;  Location: [facility];  Service: Cardiovascular;  Laterality: N/A;     Current Meds  Medication Sig  . amLODipine (NORVASC) 5 MG tablet Take 1 tablet (5 mg total) by mouth daily.  Marland Kitchen aspirin EC 81 MG tablet Take 1 tablet (81 mg total) by mouth daily.  Marland Kitchen atorvastatin (LIPITOR) 80 MG tablet Take 1 tablet (80 mg total) by mouth daily at 6 PM.  . Blood Glucose Monitoring Suppl (TRUE METRIX GO GLUCOSE METER) w/Device KIT 1 each by Does not apply route every 8 (eight) hours as needed.  . cholecalciferol (VITAMIN D3) 25 MCG (1000 UT) tablet Take 5,000 Units by mouth daily.  Marland Kitchen  clopidogrel (PLAVIX) 75 MG tablet Take 1 tablet (75 mg total) by mouth daily.  . cyclobenzaprine (FLEXERIL) 10 MG tablet TAKE ONE TABLET BY MOUTH TWICE A DAY AS NEEDED FOR MUSCLE SPASMS  . diclofenac sodium (VOLTAREN) 1 % GEL APPLY TWO GRAMS TOPICALLY FOUR TIMES A DAY  . Dimethyl Fumarate (TECFIDERA) 240 MG CPDR Take 1 capsule (240 mg total) by mouth 2 (two) times daily.  . DULoxetine (CYMBALTA) 30 MG capsule Take 1 capsule (30 mg total) by mouth daily.  . fluticasone (FLONASE) 50 MCG/ACT nasal  spray PLACE ONE SPRAY INTO BOTH NOSTRILS DAILY AS NEEDED FOR ALLERGIES OR RHINITIS  . glucose blood (TRUE METRIX BLOOD GLUCOSE TEST) test strip Use as instructed  . losartan (COZAAR) 25 MG tablet Take 1 tablet (25 mg total) by mouth daily.  . metoprolol tartrate (LOPRESSOR) 25 MG tablet Take 1 tablet (25 mg total) by mouth 2 (two) times daily.  . nitroGLYCERIN (NITROSTAT) 0.4 MG SL tablet Place 1 tablet (0.4 mg total) under the tongue every 5 (five) minutes x 3 doses as needed for chest pain.  Marland Kitchen oxybutynin (DITROPAN) 5 MG tablet TAKE ONE TABLET BY MOUTH TWICE A DAY  . pregabalin (LYRICA) 25 MG capsule Take 1 capsule (25 mg total) by mouth 2 (two) times daily.  . TRUEPLUS LANCETS 26G MISC 1 each by Does not apply route every 8 (eight) hours as needed.     Allergies:   Pork-derived products   Social History   Tobacco Use  . Smoking status: Current Every Day Smoker    Packs/day: 0.75    Years: 40.00    Pack years: 30.00    Types: Cigarettes  . Smokeless tobacco: Never Used  Substance Use Topics  . Alcohol use: Yes    Alcohol/week: 0.0 standard drinks    Comment: occasional  . Drug use: No     Family Hx: The patient's family history includes Cancer in her mother; HIV/AIDS in her brother; Hypertension in her mother; Multiple sclerosis in her daughter; Other in her father. There is no history of Breast cancer.  ROS:   Please see the history of present illness.    Leg discomfort, snoring, and intermittent weakness related to multiple sclerosis. All other systems reviewed and are negative.   Labs/Other Tests and Data Reviewed:    Recent Labs: 08/21/2018: ALT 16; TSH 1.673 08/23/2018: Hemoglobin 14.0; Magnesium 2.0; Platelets 198 09/07/2018: BUN 13; Creatinine, Ser 1.17; Potassium 4.2; Sodium 140   Recent Lipid Panel Lab Results  Component Value Date/Time   CHOL 129 08/21/2018 02:42 PM   CHOL 154 04/04/2017 10:52 AM   TRIG 30 08/21/2018 02:42 PM   HDL 42 08/21/2018 02:42 PM    HDL 53 04/04/2017 10:52 AM   CHOLHDL 3.1 08/21/2018 02:42 PM   LDLCALC 81 08/21/2018 02:42 PM   LDLCALC 91 04/04/2017 10:52 AM    Wt Readings from Last 3 Encounters:  12/10/18 262 lb (118.8 kg)  09/20/18 266 lb (120.7 kg)  09/07/18 267 lb 1.9 oz (121.2 kg)     Exam:    Vital Signs:  Ht '5\' 4"'$  (1.626 m)   Wt 262 lb (118.8 kg)   LMP 04/21/2014   BMI 44.97 kg/m  Her vital signs are not available due to absence of digital data acquiring equipment.  Well nourished, well developed female in no acute distress.  Able to form complete sentences during the interview. Based upon inquiry, she does not have edema in her lower extremities.  Skin is  clear without cyanotic nailbeds.  ASSESSMENT & PLAN:    1. CAD in native artery   2. Essential hypertension   3. Pure hypertriglyceridemia   4. Snoring   5. NSTEMI (non-ST elevated myocardial infarction) ([place])   6. Tobacco abuse    PLAN IN NUMERICAL ORDER: 1. Secondary risk prevention was discussed in great detail.  Concentration was based upon her particular risk profile. 2. Target blood pressure 130/80 mmHg was educated.  Low-salt diet and weight loss were presented as key mitigating factors. 3. May be slightly overtreated from a cholesterol standpoint.  LDL on admission was 81.  She was given high intensity statin therapy for pleomorphic effect.  We will check a lipid panel and liver panel within the next several days to make an appropriate adjustment in cholesterol therapy intensity. 4. She will need to undergo a sleep study at some point in the future. 5. No recurrence of symptoms. 6. She is not ready to establish a stop date.  Still smoking less than a pack of cigarettes per day.  Overall education and awareness concerning primary/secondary risk prevention was discussed in detail: LDL less than 70, hemoglobin A1c less than 7, blood pressure target less than 130/80 mmHg, >150 minutes of moderate aerobic activity per week, avoidance of  smoking, weight control (via diet and exercise), and continued surveillance/management of/for obstructive sleep apnea.   COVID-19 Education: The signs and symptoms of COVID-19 were discussed with the patient and how to seek care for testing (follow up with PCP or arrange E-visit).  The importance of social distancing was discussed today.  Patient Risk:   After full review of this patients clinical status, I feel that they are at least moderate risk at this time.  Time:   Today, I have spent 30 minutes with the patient with telehealth technology discussing secondary risk prevention, medication regimen, suspicion of sleep apnea, and potential to decrease intensity of statin therapy..     Medication Adjustments/Labs and Tests Ordered: Current medicines are reviewed at length with the patient today.  Concerns regarding medicines are outlined above.  Tests Ordered: Orders Placed This Encounter  Procedures  . Comp Met (CMET)  . Lipid panel   Medication Changes: No orders of the defined types were placed in this encounter.   Disposition:  Follow up in 3 month(s)  Signed, Sinclair Grooms, MD  12/10/2018 10:24 AM    [facility]

## 2018-12-10 NOTE — Patient Instructions (Signed)
Your physician recommends that you continue on your current medications as directed. Please refer to the Current Medication list given to you today.   Your physician recommends that you return for lab work in:  1-2 weeks lipid and cmet fasting    Your physician wants you to follow-up in:3 -6 months with DR Gaspar Bidding will receive a reminder letter in the mail two months in advance. If you don't receive a letter, please call our office to schedule the follow-up appointment.

## 2018-12-13 ENCOUNTER — Other Ambulatory Visit: Payer: Medicaid Other

## 2018-12-27 ENCOUNTER — Encounter: Payer: Self-pay | Admitting: *Deleted

## 2018-12-27 ENCOUNTER — Telehealth: Payer: Self-pay | Admitting: Interventional Cardiology

## 2018-12-27 ENCOUNTER — Telehealth (INDEPENDENT_AMBULATORY_CARE_PROVIDER_SITE_OTHER): Payer: Medicare Other | Admitting: Neurology

## 2018-12-27 ENCOUNTER — Other Ambulatory Visit: Payer: Medicare Other | Admitting: *Deleted

## 2018-12-27 ENCOUNTER — Other Ambulatory Visit: Payer: Self-pay | Admitting: *Deleted

## 2018-12-27 ENCOUNTER — Other Ambulatory Visit: Payer: Self-pay

## 2018-12-27 DIAGNOSIS — R2681 Unsteadiness on feet: Secondary | ICD-10-CM

## 2018-12-27 DIAGNOSIS — G35 Multiple sclerosis: Secondary | ICD-10-CM | POA: Diagnosis not present

## 2018-12-27 DIAGNOSIS — I2510 Atherosclerotic heart disease of native coronary artery without angina pectoris: Secondary | ICD-10-CM

## 2018-12-27 DIAGNOSIS — E781 Pure hyperglyceridemia: Secondary | ICD-10-CM

## 2018-12-27 LAB — COMPREHENSIVE METABOLIC PANEL
ALT: 18 IU/L (ref 0–32)
AST: 14 IU/L (ref 0–40)
Albumin/Globulin Ratio: 1.2 (ref 1.2–2.2)
Albumin: 4.1 g/dL (ref 3.8–4.9)
Alkaline Phosphatase: 88 IU/L (ref 39–117)
BUN/Creatinine Ratio: 8 — ABNORMAL LOW (ref 9–23)
BUN: 11 mg/dL (ref 6–24)
Bilirubin Total: 0.6 mg/dL (ref 0.0–1.2)
CO2: 21 mmol/L (ref 20–29)
Calcium: 9.4 mg/dL (ref 8.7–10.2)
Chloride: 104 mmol/L (ref 96–106)
Creatinine, Ser: 1.41 mg/dL — ABNORMAL HIGH (ref 0.57–1.00)
GFR calc Af Amer: 47 mL/min/{1.73_m2} — ABNORMAL LOW (ref >59)
GFR calc non Af Amer: 41 mL/min/{1.73_m2} — ABNORMAL LOW (ref >59)
Globulin, Total: 3.3 g/dL (ref 1.5–4.5)
Glucose: 129 mg/dL — ABNORMAL HIGH (ref 65–99)
Potassium: 3.9 mmol/L (ref 3.5–5.2)
Sodium: 142 mmol/L (ref 134–144)
Total Protein: 7.4 g/dL (ref 6.0–8.5)

## 2018-12-27 LAB — LIPID PANEL
Chol/HDL Ratio: 2.9 ratio (ref 0.0–4.4)
Cholesterol, Total: 160 mg/dL (ref 100–199)
HDL: 56 mg/dL (ref >39)
LDL Calculated: 92 mg/dL (ref 0–99)
Triglycerides: 59 mg/dL (ref 0–149)
VLDL Cholesterol Cal: 12 mg/dL (ref 5–40)

## 2018-12-27 NOTE — Telephone Encounter (Signed)
New Message:   Caryl Pina from [facility] would like to add something to the patient lab work . Please call her back at [phone]

## 2018-12-27 NOTE — Progress Notes (Signed)
MRI scheduled.  Waiting for lab to call me back about adding test.

## 2018-12-27 NOTE — Telephone Encounter (Signed)
Spoke with Caryl Pina from [facility] this morning and she inquired about adding a CBC with diff to our labs.  Spoke with Lanny Hurst and unable to add.  Made Caryl Pina aware.

## 2018-12-27 NOTE — Progress Notes (Signed)
   Virtual Visit via Video Note The purpose of this virtual visit is to provide medical care while limiting exposure to the novel coronavirus.    Consent was obtained for video visit:  Yes.   Answered questions that patient had about telehealth interaction:  Yes.   I discussed the limitations, risks, security and privacy concerns of performing an evaluation and management service by telemedicine. I also discussed with the patient that there may be a patient responsible charge related to this service. The patient expressed understanding and agreed to proceed.  Pt location: Home Physician Location: office Name of referring provider:  Ladell Pier, MD I connected with Tiffany Brady at patients initiation/request on 12/27/2018 at 10:00 AM EDT by video enabled telemedicine application and verified that I am speaking with the correct person using two identifiers. Pt MRN:  [id number] Pt DOB:  Nov 07, 1960 Video Participants:  Tiffany Brady   History of Present Illness: This is a 58 y.o. female returning for follow-up of multiple sclerosis.   She is doing relatively well with respect to her multiple sclerosis and has good adherence and tolerability with Tecfidera.  She continues to have spells of dizziness, imbalance, and difficulty with gait.  At her last visit, imaging of the lumbar and thoracic spine was ordered, however this was not performed.  She does not have radicular leg pain or bowel/bladder incontinence.  She has chronic generalized pain from fibromyalgia.  She suffered a NSTEMI in December 2019 and had PCI with stent.  She is recovering well.  Observations/Objective:   Patient is awake, alert, and appears comfortable.  Oriented x 4.   Extraocular muscles are intact. No ptosis.  Face is symmetric.  Speech is not dysarthric. Tongue is midline. Antigravity in all extremities.  No pronator drift. Gait appears slow and wide-based, stable.   Assessment and Plan:  1.   Relapsing  remitting multiple sclerosis with supportive data from imaging and CSF testing (diagnosed 2016).  She has been on Tecfidera since 2017 and is tolerating this well.  She is due for annual surveillance imaging.  - MRI brain wwo contrast  - Continue her on Tecfidera 240 mg twice daily and  vitamin D 4000 units.  - She had CMP checked today, I will follow-up with this.  I will call lab and see if they can add CBC  2.  Gait abnormality.  She will be starting physical therapy.  I urged her to use a cane and consider walker, as needed.   Follow Up Instructions:   I discussed the assessment and treatment plan with the patient. The patient was provided an opportunity to ask questions and all were answered. The patient agreed with the plan and demonstrated an understanding of the instructions.   The patient was advised to call back or seek an in-person evaluation if the symptoms worsen or if the condition fails to improve as anticipated.  Follow-up in 3 months    Total time spent:  30 min  [name], DO

## 2018-12-27 NOTE — Progress Notes (Signed)
They did not draw the right color tube for this.  Does she need to come back in?

## 2018-12-28 ENCOUNTER — Encounter: Payer: Self-pay | Admitting: Internal Medicine

## 2018-12-28 ENCOUNTER — Ambulatory Visit: Payer: Medicare Other | Attending: Internal Medicine | Admitting: Internal Medicine

## 2018-12-28 DIAGNOSIS — I129 Hypertensive chronic kidney disease with stage 1 through stage 4 chronic kidney disease, or unspecified chronic kidney disease: Secondary | ICD-10-CM | POA: Insufficient documentation

## 2018-12-28 DIAGNOSIS — N644 Mastodynia: Secondary | ICD-10-CM

## 2018-12-28 DIAGNOSIS — M79604 Pain in right leg: Secondary | ICD-10-CM

## 2018-12-28 DIAGNOSIS — G35D Multiple sclerosis, unspecified: Secondary | ICD-10-CM

## 2018-12-28 DIAGNOSIS — M797 Fibromyalgia: Secondary | ICD-10-CM | POA: Insufficient documentation

## 2018-12-28 DIAGNOSIS — F17210 Nicotine dependence, cigarettes, uncomplicated: Secondary | ICD-10-CM | POA: Insufficient documentation

## 2018-12-28 DIAGNOSIS — Z79899 Other long term (current) drug therapy: Secondary | ICD-10-CM | POA: Insufficient documentation

## 2018-12-28 DIAGNOSIS — I10 Essential (primary) hypertension: Secondary | ICD-10-CM

## 2018-12-28 DIAGNOSIS — G35 Multiple sclerosis: Secondary | ICD-10-CM

## 2018-12-28 DIAGNOSIS — M79605 Pain in left leg: Secondary | ICD-10-CM

## 2018-12-28 DIAGNOSIS — E1159 Type 2 diabetes mellitus with other circulatory complications: Secondary | ICD-10-CM | POA: Diagnosis not present

## 2018-12-28 DIAGNOSIS — Z6841 Body Mass Index (BMI) 40.0 and over, adult: Secondary | ICD-10-CM | POA: Diagnosis not present

## 2018-12-28 DIAGNOSIS — Z720 Tobacco use: Secondary | ICD-10-CM

## 2018-12-28 DIAGNOSIS — N183 Chronic kidney disease, stage 3 unspecified: Secondary | ICD-10-CM

## 2018-12-28 DIAGNOSIS — E1122 Type 2 diabetes mellitus with diabetic chronic kidney disease: Secondary | ICD-10-CM | POA: Insufficient documentation

## 2018-12-28 DIAGNOSIS — E669 Obesity, unspecified: Secondary | ICD-10-CM | POA: Insufficient documentation

## 2018-12-28 DIAGNOSIS — I2510 Atherosclerotic heart disease of native coronary artery without angina pectoris: Secondary | ICD-10-CM

## 2018-12-28 MED ORDER — VARENICLINE TARTRATE 0.5 MG X 11 & 1 MG X 42 PO MISC: ORAL | 0 refills | Status: DC

## 2018-12-28 MED ORDER — GLUCOSE BLOOD VI STRP: ORAL_STRIP | 12 refills | Status: DC

## 2018-12-28 MED ORDER — AMLODIPINE BESYLATE 5 MG PO TABS: 5.0000 mg | ORAL_TABLET | Freq: Every day | ORAL | 6 refills | Status: DC

## 2018-12-28 MED ORDER — VARENICLINE TARTRATE 1 MG PO TABS: 1.0000 mg | ORAL_TABLET | Freq: Two times a day (BID) | ORAL | 1 refills | Status: DC

## 2018-12-28 MED ORDER — METOPROLOL TARTRATE 25 MG PO TABS: 25.0000 mg | ORAL_TABLET | Freq: Two times a day (BID) | ORAL | 6 refills | Status: DC

## 2018-12-28 MED ORDER — LOSARTAN POTASSIUM 25 MG PO TABS: 25.0000 mg | ORAL_TABLET | Freq: Every day | ORAL | 6 refills | Status: DC

## 2018-12-28 MED ORDER — CYCLOBENZAPRINE HCL 10 MG PO TABS: ORAL_TABLET | ORAL | 1 refills | Status: DC

## 2018-12-28 NOTE — Progress Notes (Signed)
Virtual Visit via Telephone Note  I connected with Tiffany Brady on 12/28/18 at 11:52 a.m by telephone from my office and verified that I am speaking with the correct person using two identifiers. Pt is at home.  Only pt and myself participated in this encounter   I discussed the limitations, risks, security and privacy concerns of performing an evaluation and management service by telephone and the availability of in person appointments. I also discussed with the patient that there may be a patient responsible charge related to this service. The patient expressed understanding and agreed to proceed.   History of Present Illness: Pt with hx of DM type 2, HTN, Tob, CAD, obesity, fibromyalgia, CKD stage 3 and MS.  Last seen in 08/2019   DM:  Last A1C was 6.1 in December.  She has been diet control.  Not checking her BS regularly.  She did check it this morning while she was on the phone with me and it was 151.  This was about 2 hours after her breakfast.  Needs stripes for True Metrix meter.    Eating habits: reports dec appetite. Drinks KoolAid with sugar as sweetener.  Loves bread, potatoes and rice.  Has never seen RD in past.  Does have a scale at home but does not wgh self. Last wgh with me was 267 lbs with BMI of 45.8 She tries to walk outside once a day for 15 mins Last eye exam 04/2018.  Has rxn for new glasses but not filled as yet  HTN/CAD: no device to check BP but would like to get one. Limits salt in foods No CP/SOB/LE edema  Gets sharp pain in LT breast about 2 x a wk that last a quick second since cardiac cath last yr.  She has not paid any attention to whether it relates to activity.   Had MMG 06/2018.  She checks breasts regular and has not felt any abnormal masses.  Tob dep:  "I'm still smoking but it's getting better.  I don't even smoke a 1/2 a pk now."  She does not find the patches helpful.    MS:  Had video visit with neurologist yesterday.  Pain in legs and "legs be real  weak.   Some days I can not walk right."  No numbness in legs.  "They just cramp and hurt" sometimes when walking around and at nights.  Dr. Posey Pronto had ordered MRI of the lumbar and thoracic spine the end of last year but patient never had it done.  She tells me that the neurologist does not think the pain and cramps that she has in her legs are due to the MS.  CKD 3:  Not on any NSAIDS.  Recent CMP reveals dec in eGFR compared to 4 mths ago but still CKD 3 range.  Observations/Objective: No direct observation done as this was a telephone encounter.    Chemistry      Component Value Date/Time   NA 142 12/27/2018 0853   K 3.9 12/27/2018 0853   CL 104 12/27/2018 0853   CO2 21 12/27/2018 0853   BUN 11 12/27/2018 0853   CREATININE 1.41 (H) 12/27/2018 0853   CREATININE 1.40 (H) 03/28/2016 1601      Component Value Date/Time   CALCIUM 9.4 12/27/2018 0853   ALKPHOS 88 12/27/2018 0853   AST 14 12/27/2018 0853   ALT 18 12/27/2018 0853   BILITOT 0.6 12/27/2018 0853     Lab Results  Component Value Date  CHOL 160 12/27/2018   HDL 56 12/27/2018   LDLCALC 92 12/27/2018   TRIG 59 12/27/2018   CHOLHDL 2.9 12/27/2018    Assessment and Plan: 1. Controlled type 2 diabetes mellitus with other circulatory complication, without long-term current use of insulin ([place]) 2. Body mass index (BMI) 45.0-49.9, adult (HCC) Discussed healthy eating habits.  Advised to cut out sugary drinks from her diet.  Patient to try using Splenda or 0-calorie sweetener in her Kool-Aid.  Recommend decrease consumption of white carbohydrates.  Change to whole-grain wheat bread instead of white bread.  Patient is agreeable to seeing a nutritionist. Encouraged her to continue trying to walk daily as tolerated Encouraged her to check her weight about twice a month. - glucose blood (TRUE METRIX BLOOD GLUCOSE TEST) test strip; Check blood sugar once a day  Dispense: 100 each; Refill: 12 - Amb ref to [facility]  Therapy-MNT  3. Essential hypertension We will send a prescription to a pharmacy for home blood pressure monitoring device.  Encouraged her to check blood pressure 2-3 times a week.  Goal is 130/80 or lower.  Continue current blood pressure medications which were verified with her over the phone. - losartan (COZAAR) 25 MG tablet; Take 1 tablet (25 mg total) by mouth daily.  Dispense: 30 tablet; Refill: 6 - amLODipine (NORVASC) 5 MG tablet; Take 1 tablet (5 mg total) by mouth daily.  Dispense: 30 tablet; Refill: 6 - metoprolol tartrate (LOPRESSOR) 25 MG tablet; Take 1 tablet (25 mg total) by mouth 2 (two) times daily.  Dispense: 60 tablet; Refill: 6  4. Tobacco abuse Advised to quit.  We discussed methods to help her quit.  She is wanting to try the Chantix.  I went over possible side effects including mood swings and bad dreams.  She will start with the Chantix starting pack.  About 5 minutes spent on counseling. - varenicline (CHANTIX CONTINUING MONTH PAK) 1 MG tablet; Take 1 tablet (1 mg total) by mouth 2 (two) times daily.  Dispense: 60 tablet; Refill: 1 - varenicline (CHANTIX STARTING MONTH PAK) 0.5 MG X 11 & 1 MG X 42 tablet; one 0.5 mg tab PO once daily x 3 days, then one 0.5 mg tablet twice daily for 4 days, then increase to one 1 mg tablet twice daily.  Dispense: 53 tablet; Refill: 0  5. Multiple sclerosis (HCC) 6. Pain in both lower extremities I think her leg pains and cramps are likely related to multiple sclerosis.  She is on statin therapy since being diagnosed with CAD in December.  She has not noticed an increase in the cramps since being on Lipitor and actually confessed to me today that she had not been taking it as consistently as she should every night because she forgets to take it. -I do see that Dr. Posey Pronto had ordered an MRI of the thoracic and lumbar spine.  I encouraged her to have it done.  She will call to reschedule those.  If these studies are unrevealing, I will get ABI to  rule out peripheral vascular disease. - cyclobenzaprine (FLEXERIL) 10 MG tablet; TAKE ONE TABLET BY MOUTH TWICE A DAY AS NEEDED FOR MUSCLE SPASMS  Dispense: 60 tablet; Refill: 1  7. Coronary artery disease involving native coronary artery of native heart without angina pectoris Clinically stable and followed by cardiology  8. CKD (chronic kidney disease) stage 3, GFR 30-59 ml/min (HCC) Slight worsening of GFR.  Patient is not on any NSAIDs.  We will continue to  monitor.  Discussed the importance of good blood pressure control  9. Breast pain, left Monitor for now   Follow Up Instructions:    I discussed the assessment and treatment plan with the patient. The patient was provided an opportunity to ask questions and all were answered. The patient agreed with the plan and demonstrated an understanding of the instructions.   The patient was advised to call back or seek an in-person evaluation if the symptoms worsen or if the condition fails to improve as anticipated.  I provided 28 minutes of non-face-to-face time during this encounter.   Karle Plumber, MD

## 2018-12-28 NOTE — Progress Notes (Signed)
Pt is wanting to know if she is suppose to still be taking the norvasc

## 2019-01-02 ENCOUNTER — Telehealth: Payer: Self-pay | Admitting: *Deleted

## 2019-01-02 DIAGNOSIS — I2510 Atherosclerotic heart disease of native coronary artery without angina pectoris: Secondary | ICD-10-CM

## 2019-01-02 MED ORDER — EZETIMIBE 10 MG PO TABS: 10.0000 mg | ORAL_TABLET | Freq: Every day | ORAL | 11 refills | Status: DC

## 2019-01-02 NOTE — Telephone Encounter (Signed)
Patient notified of result.  Please refer to phone note from today for complete details.   Tiffany Brady, CMA 01/02/2019 1:20 PM   Pt is agreeable to plan of care to start Zetia 10 mg daily, Rx has been sent in to [facility]. Pt would like to have her repeat labs in 3 months when she see's Dr. Tamala Brady 03/22/19 @ 9:20. Pt has been made aware nothing to eat after midnight the night before Dr. Tamala Brady appt, nothing to eat the morning of appt, take morning meds with water. Pt is agreeable to plan of care and thanked me for the call.

## 2019-01-11 ENCOUNTER — Other Ambulatory Visit: Payer: Self-pay | Admitting: Internal Medicine

## 2019-01-11 DIAGNOSIS — J301 Allergic rhinitis due to pollen: Secondary | ICD-10-CM

## 2019-01-11 DIAGNOSIS — M654 Radial styloid tenosynovitis [de Quervain]: Secondary | ICD-10-CM

## 2019-01-17 ENCOUNTER — Ambulatory Visit: Payer: Medicaid Other | Admitting: Registered"

## 2019-01-21 ENCOUNTER — Ambulatory Visit: Payer: Medicare Other | Admitting: Neurology

## 2019-01-23 ENCOUNTER — Ambulatory Visit: Payer: Medicaid Other | Admitting: Registered"

## 2019-02-12 IMAGING — MR MR HEAD WO/W CM
14 series · 48 of 48 positions shown · IV contrast (10 ML MULTIHANCE)
Comparison: MRI head 07/24/2015

CLINICAL DATA: Multiple sclerosis.  Dizziness.

Creatinine was obtained on site at [HOSPITAL] at [HOSPITAL].
Results: Creatinine 10 mL MultiHance IV mg/dL.
EXAM:
MRI HEAD WITHOUT AND WITH CONTRAST
TECHNIQUE: Multiplanar, multiecho pulse sequences of the brain and surrounding
structures were obtained without and with intravenous contrast.
CONTRAST:  10mL MULTIHANCE GADOBENATE DIMEGLUMINE 529 MG/ML IV SOLN

[Series 2: t1_se_sag · sagittal · 5.0mm · 0.45mm/px · 1 of 21 slices shown]
[im 1/21]
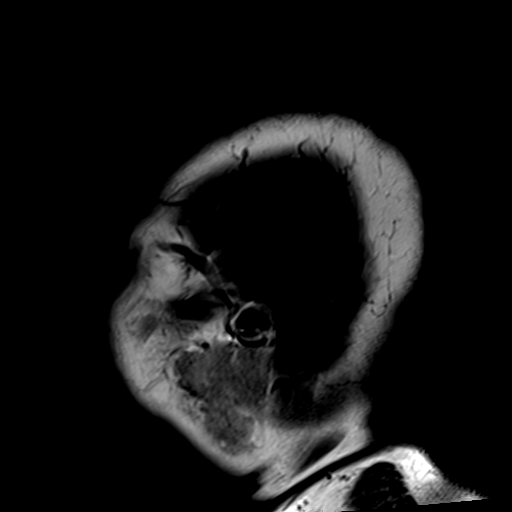

[Series 3: ep2d_diff_(id)_trace · axial · 3.0mm · 1.80mm/px · z∈[-1,+131]mm · 5 of 89 slices shown]
[im 1/89]
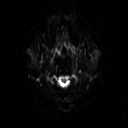
[im 23/89]
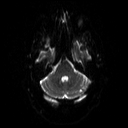
[im 45/89]
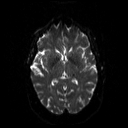
[im 67/89]
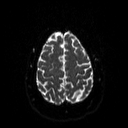
[im 89/89]
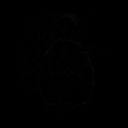

[Series 4: ep2d_diff_(id)_trace_adc · axial · 3.0mm · 1.80mm/px · z∈[-1,+131]mm · 2 of 44 slices shown]
[im 1/44]
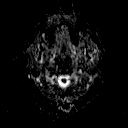
[im 44/44]
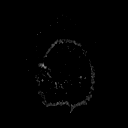

[Series 5: ep2d_diff_cor · coronal · 5.0mm · 1.77mm/px · 2 of 45 slices shown]
[im 1/45]
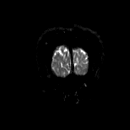
[im 45/45]
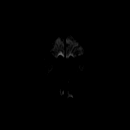

[Series 6: ep2d_diff_cor_adc · coronal · 5.0mm · 1.77mm/px · 2 of 23 slices shown]
[im 1/23]
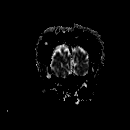
[im 23/23]
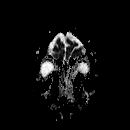

[Series 8: swi_images · axial · 2.0mm · 0.90mm/px · z∈[-6,+136]mm · 5 of 72 slices shown]
[im 1/72]
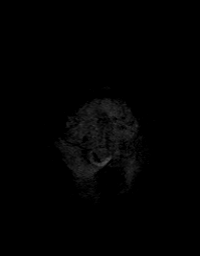
[im 18/72]
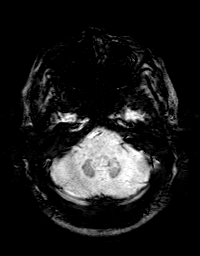
[im 36/72]
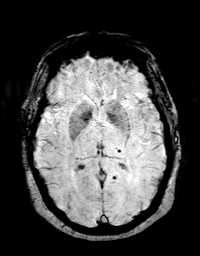
[im 54/72]
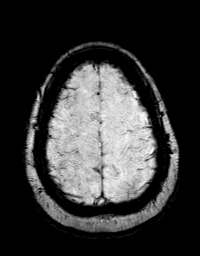
[im 72/72]
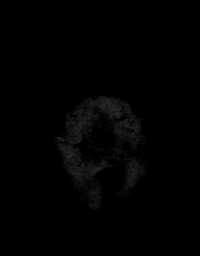

[Series 9: FLAIR · axial · 3.0mm · 0.43mm/px · z∈[-1,+131]mm · 2 of 23 slices shown (1 of 2)]
[im 1/23]
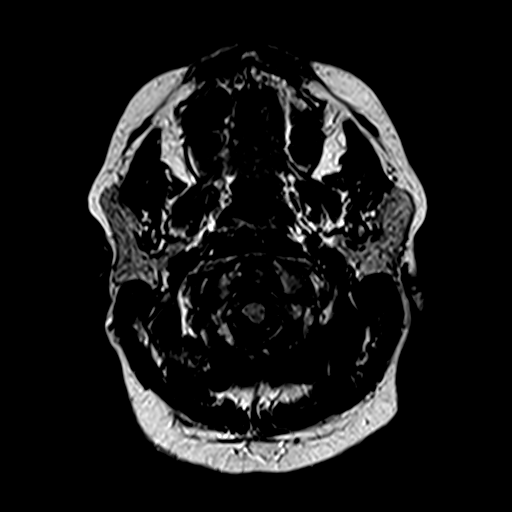
[im 23/23]
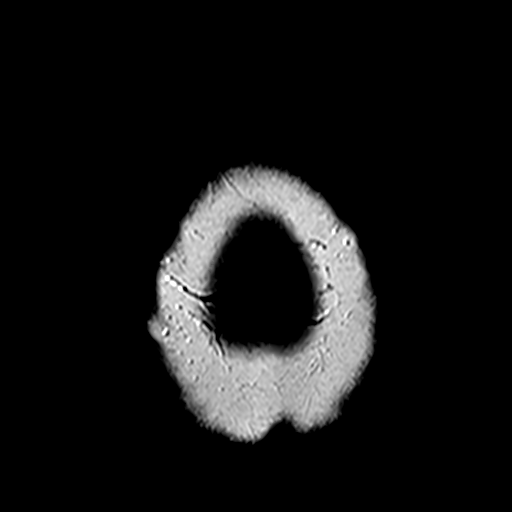

[Series 10: t2_tse_tra_512 · axial · 5.0mm · 0.60mm/px · z∈[-1,+131]mm · 2 of 23 slices shown]
[im 1/23]
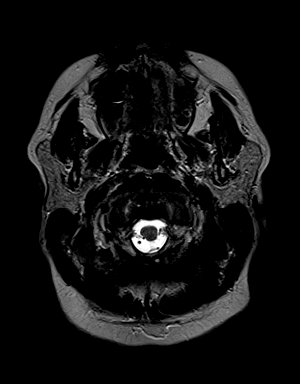
[im 23/23]
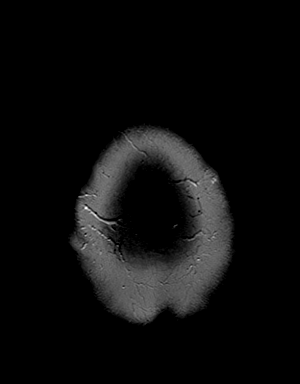

[Series 11: t1_mpr_tra · axial · 1.0mm · 0.72mm/px · z∈[-6,+137]mm · 10 of 144 slices shown]
[im 1/144]
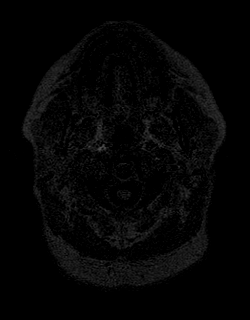
[im 16/144]
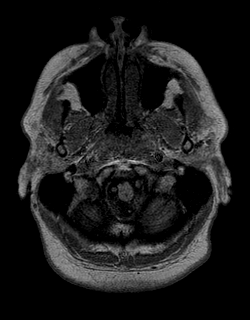
[im 32/144]
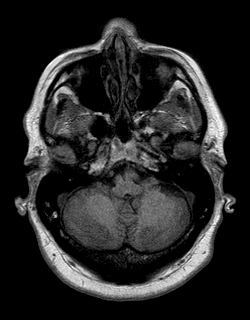
[im 48/144]
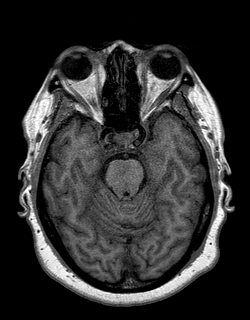
[im 64/144]
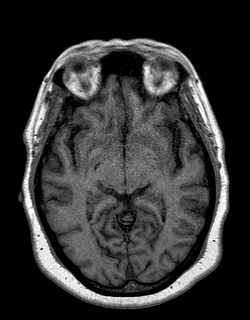
[im 80/144]
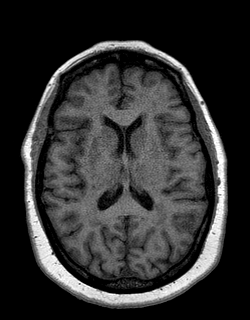
[im 96/144]
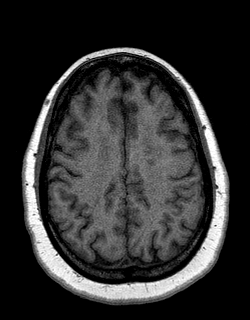
[im 112/144]
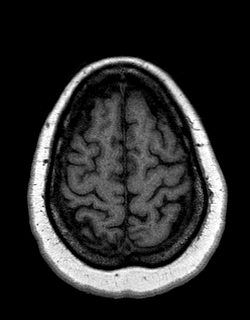
[im 128/144]
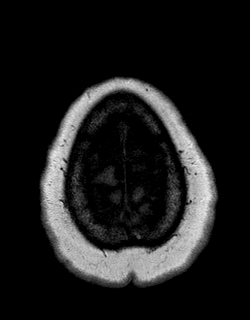
[im 144/144]
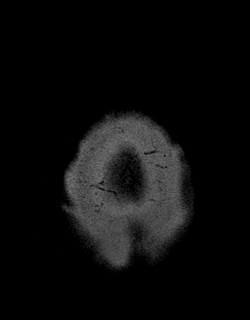

[Series 12: FLAIR · sagittal · 5.0mm · 0.45mm/px · 2 of 25 slices shown (2 of 2)]
[im 1/25]
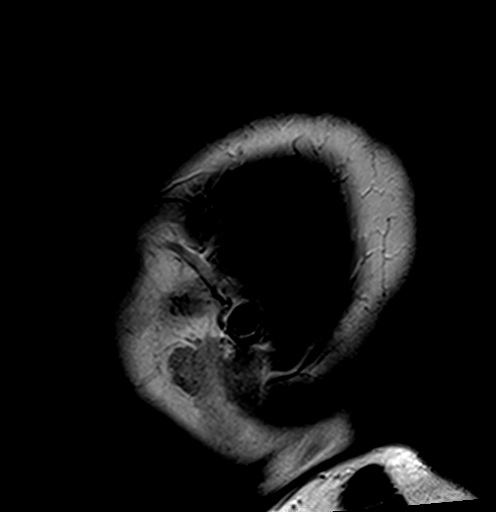
[im 25/25]
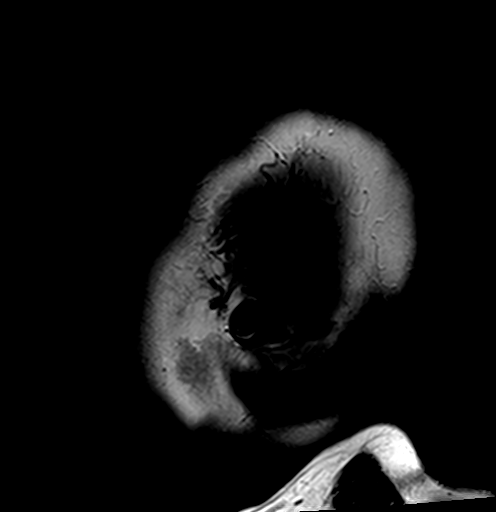

[Series 14: T2 · coronal · 5.0mm · 0.45mm/px · 2 of 24 slices shown]
[im 1/24]
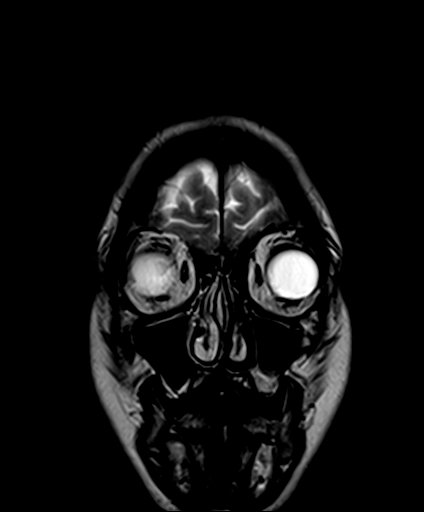
[im 24/24]
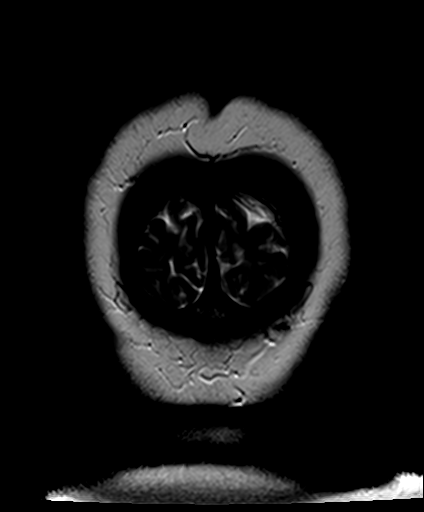

[Series 15: T1 post-contrast · coronal · 5.0mm · 0.72mm/px · 2 of 24 slices shown]
[im 1/24]
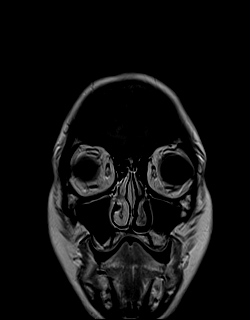
[im 24/24]
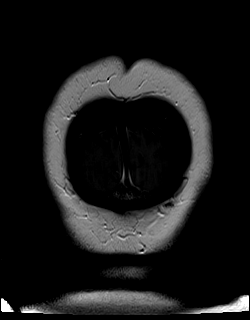

[Series 16: post t1_mpr_tra · axial · 1.0mm · 0.72mm/px · z∈[-13,+130]mm · 10 of 144 slices shown]
[im 1/144]
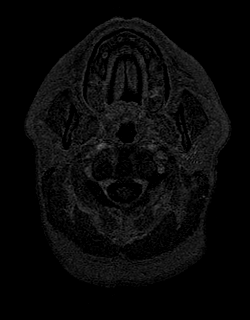
[im 16/144]
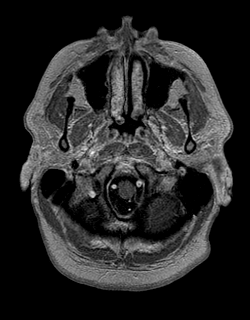
[im 32/144]
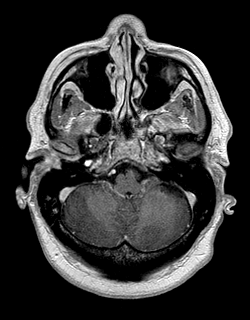
[im 48/144]
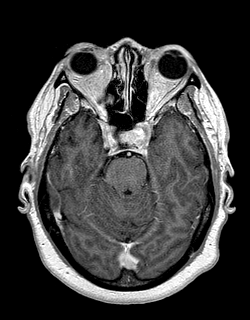
[im 64/144]
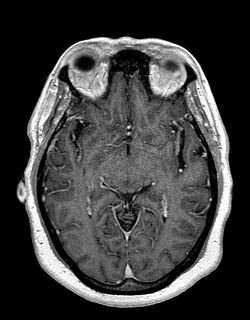
[im 80/144]
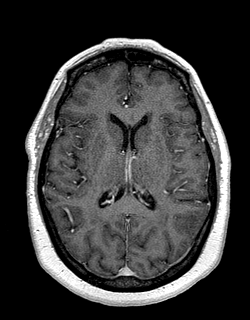
[im 96/144]
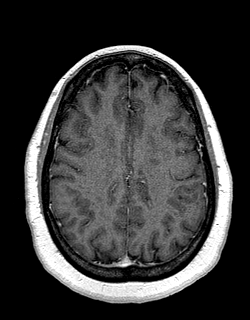
[im 112/144]
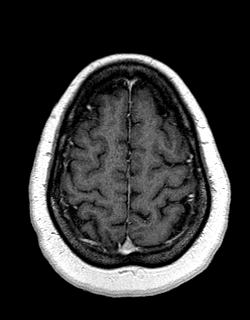
[im 128/144]
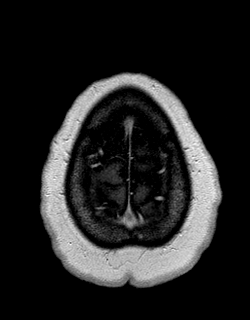
[im 144/144]
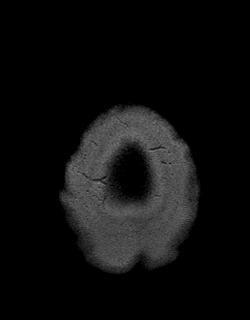

[Series 17: t1_se_sag post · sagittal · 5.0mm · 0.45mm/px · 1 of 21 slices shown]
[im 1/21]
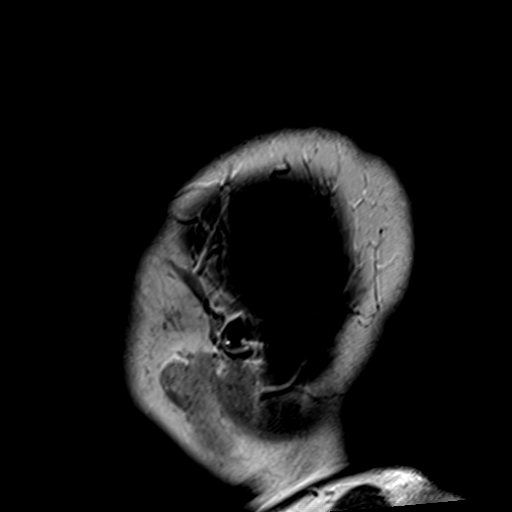

[48 of 48 positions shown; findings below may reference images not displayed]

FINDINGS: Brain: Numerous small periventricular white matter hyperintense
lesions are stable from the prior study. No lesions show abnormal
enhancement or restricted diffusion. No lesions in the posterior
fossa.

Ventricle size normal. Negative for acute infarct. Negative for mass
lesion. Chronic microhemorrhage in the left occipital lobe, left
thalamus. These have developed since the prior MRI.

Vascular: Normal arterial flow void

Skull and upper cervical spine: Negative

Sinuses/Orbits: Negative

Other: None
IMPRESSION: Numerous small periventricular white matter hyperintensities are
stable since 3589 and are consistent with the clinical diagnosis of
multiple sclerosis. No active lesions identified.

Interval development of chronic microhemorrhage in the left thalamus
and left occipital lobe, question hypertension

## 2019-02-18 ENCOUNTER — Other Ambulatory Visit: Payer: Self-pay | Admitting: Physician Assistant

## 2019-02-18 ENCOUNTER — Other Ambulatory Visit: Payer: Self-pay | Admitting: Internal Medicine

## 2019-02-18 ENCOUNTER — Ambulatory Visit
Admission: RE | Admit: 2019-02-18 | Discharge: 2019-02-18 | Disposition: A | Discharge status: Home / Self Care | Payer: Medicaid Other | Source: Ambulatory Visit | Attending: Neurology | Admitting: Neurology

## 2019-02-18 ENCOUNTER — Other Ambulatory Visit: Payer: Self-pay

## 2019-02-18 DIAGNOSIS — I10 Essential (primary) hypertension: Secondary | ICD-10-CM

## 2019-02-18 DIAGNOSIS — G35 Multiple sclerosis: Secondary | ICD-10-CM

## 2019-02-18 DIAGNOSIS — M797 Fibromyalgia: Secondary | ICD-10-CM

## 2019-02-18 MED ORDER — GADOBENATE DIMEGLUMINE 529 MG/ML IV SOLN
10.0000 mL | Freq: Once | INTRAVENOUS | Status: AC | PRN
  Administered 2019-02-18: 10 mL via INTRAVENOUS

## 2019-02-19 ENCOUNTER — Telehealth: Payer: Self-pay

## 2019-02-19 NOTE — Telephone Encounter (Signed)
YOUR CARDIOLOGY TEAM HAS ARRANGED FOR AN E-VISIT FOR YOUR APPOINTMENT - PLEASE REVIEW IMPORTANT INFORMATION BELOW SEVERAL DAYS PRIOR TO YOUR APPOINTMENT  Due to the recent COVID-19 pandemic, we are transitioning in-person office visits to tele-medicine visits in an effort to decrease unnecessary exposure to our patients, their families, and staff. These visits are billed to your insurance just like a normal visit is. We also encourage you to sign up for MyChart if you have not already done so. You will need a smartphone if possible. For patients that do not have this, we can still complete the visit using a regular telephone but do prefer a smartphone to enable video when possible. You may have a family member that lives with you that can help. If possible, we also ask that you have a blood pressure cuff and scale at home to measure your blood pressure, heart rate and weight prior to your scheduled appointment. Patients with clinical needs that need an in-person evaluation and testing will still be able to come to the office if absolutely necessary. If you have any questions, feel free to call our office.     YOUR PROVIDER WILL BE USING THE FOLLOWING PLATFORM TO COMPLETE YOUR VISIT: Doximity  . IF USING MYCHART - How to Download the MyChart App to Your SmartPhone   - If Apple, go to App Store and type in MyChart in the search bar and download the app. If Android, ask patient to go to Google Play Store and type in MyChart in the search bar and download the app. The app is free but as with any other app downloads, your phone may require you to verify saved payment information or Apple/Android password.  - You will need to then log into the app with your MyChart username and password, and select [facility] as your healthcare provider to link the account.  - When it is time for your visit, go to the MyChart app, find appointments, and click Begin Video Visit. Be sure to Select Allow for your device to  access the Microphone and Camera for your visit. You will then be connected, and your provider will be with you shortly.  **If you have any issues connecting or need assistance, please contact MyChart service desk [phone] ([phone])**  **If using a computer, in order to ensure the best quality for your visit, you will need to use either of the following Internet Browsers: Google Chrome or Microsoft Edge**  . IF USING DOXIMITY or [email or web address].ME - The staff will give you instructions on receiving your link to join the meeting the day of your visit.      2-3 DAYS BEFORE YOUR APPOINTMENT  You will receive a telephone call from one of our [facility] team members - your caller ID may say "Unknown caller." If this is a video visit, we will walk you through how to get the video launched on your phone. We will remind you check your blood pressure, heart rate and weight prior to your scheduled appointment. If you have an Apple Watch or Kardia, please upload any pertinent ECG strips the day before or morning of your appointment to MyChart. Our staff will also make sure you have reviewed the consent and agree to move forward with your scheduled tele-health visit.     THE DAY OF YOUR APPOINTMENT  Approximately 15 minutes prior to your scheduled appointment, you will receive a telephone call from one of HeartCare team - your caller ID may say "Unknown caller."    Our staff will confirm medications, vital signs for the day and any symptoms you may be experiencing. Please have this information available prior to the time of visit start. It may also be helpful for you to have a pad of paper and pen handy for any instructions given during your visit. They will also walk you through joining the smartphone meeting if this is a video visit.    CONSENT FOR TELE-HEALTH VISIT - PLEASE REVIEW  I hereby voluntarily request, consent and authorize [facility] and its employed or contracted physicians, physician  assistants, nurse practitioners or other licensed health care professionals (the Practitioner), to provide me with telemedicine health care services (the "Services") as deemed necessary by the treating Practitioner. I acknowledge and consent to receive the Services by the Practitioner via telemedicine. I understand that the telemedicine visit will involve communicating with the Practitioner through live audiovisual communication technology and the disclosure of certain medical information by electronic transmission. I acknowledge that I have been given the opportunity to request an in-person assessment or other available alternative prior to the telemedicine visit and am voluntarily participating in the telemedicine visit.  I understand that I have the right to withhold or withdraw my consent to the use of telemedicine in the course of my care at any time, without affecting my right to future care or treatment, and that the Practitioner or I may terminate the telemedicine visit at any time. I understand that I have the right to inspect all information obtained and/or recorded in the course of the telemedicine visit and may receive copies of available information for a reasonable fee.  I understand that some of the potential risks of receiving the Services via telemedicine include:  . Delay or interruption in medical evaluation due to technological equipment failure or disruption; . Information transmitted may not be sufficient (e.g. poor resolution of images) to allow for appropriate medical decision making by the Practitioner; and/or  . In rare instances, security protocols could fail, causing a breach of personal health information.  Furthermore, I acknowledge that it is my responsibility to provide information about my medical history, conditions and care that is complete and accurate to the best of my ability. I acknowledge that Practitioner's advice, recommendations, and/or decision may be based on  factors not within their control, such as incomplete or inaccurate data provided by me or distortions of diagnostic images or specimens that may result from electronic transmissions. I understand that the practice of medicine is not an exact science and that Practitioner makes no warranties or guarantees regarding treatment outcomes. I acknowledge that I will receive a copy of this consent concurrently upon execution via email to the email address I last provided but may also request a printed copy by calling the office of [facility].    I understand that my insurance will be billed for this visit.   I have read or had this consent read to me. . I understand the contents of this consent, which adequately explains the benefits and risks of the Services being provided via telemedicine.  . I have been provided ample opportunity to ask questions regarding this consent and the Services and have had my questions answered to my satisfaction. . I give my informed consent for the services to be provided through the use of telemedicine in my medical care  By participating in this telemedicine visit I agree to the above.  

## 2019-02-21 ENCOUNTER — Ambulatory Visit: Payer: Medicaid Other | Admitting: Registered"

## 2019-02-21 NOTE — Progress Notes (Signed)
Virtual Visit via Video Note   This visit type was conducted due to national recommendations for restrictions regarding the COVID-19 Pandemic (e.g. social distancing) in an effort to limit this patient's exposure and mitigate transmission in our community.  Due to her co-morbid illnesses, this patient is at least at moderate risk for complications without adequate follow up.  This format is felt to be most appropriate for this patient at this time.  All issues noted in this document were discussed and addressed.  A limited physical exam was performed with this format.  Please refer to the patient's chart for her consent to telehealth for [facility].   Date:  02/22/2019   ID:  Tiffany Brady, DOB [birth year], MRN [id number]  Patient Location: Home Provider Location: Home  PCP:  [name], DO  Cardiologist:  Sinclair Grooms, MD  Electrophysiologist:  None   Evaluation Performed:  Follow-Up Visit  Chief Complaint: CAD   History of Present Illness:    Tiffany Brady is a 58 y.o. female with coronary artery disease, non-ST segment myocardial infarction August 21, 2018, prediabetes mellitus type II (hemoglobin A1c 6.1), mild hyperlipidemia with untreated LDL of 81 at the time of presentation, cigarette smoking, morbid obesity, essential hypertension, and gout.  Other significant medical issue is multiple sclerosis.  Last lipid panel was sub-optimal with LDL 92 on high intensity statin. No angina. No medication side effect. Still smoking but less and now on Chantx.   The patient does not have symptoms concerning for COVID-19 infection (fever, chills, cough, or new shortness of breath).    Past Medical History:  Diagnosis Date  . Chronic kidney disease    pt reported kidney failure  . Chronic pain syndrome   . Gout   . Hypertension   . MS (multiple sclerosis) ([place])   . Tobacco use    Past Surgical History:  Procedure Laterality Date  . FOOT SURGERY Right   . LEFT  HEART CATH AND CORONARY ANGIOGRAPHY N/A 08/22/2018   Procedure: LEFT HEART CATH AND CORONARY ANGIOGRAPHY;  Surgeon: Sherren Mocha, MD;  Location: [facility];  Service: Cardiovascular;  Laterality: N/A;     Current Meds  Medication Sig  . amLODipine (NORVASC) 5 MG tablet Take 1 tablet (5 mg total) by mouth daily.  Marland Kitchen aspirin EC 81 MG tablet Take 1 tablet (81 mg total) by mouth daily.  Marland Kitchen atorvastatin (LIPITOR) 80 MG tablet TAKE 1 TABLET (80 MG TOTAL) BY MOUTH DAILY AT 6 PM.  . Blood Glucose Monitoring Suppl (TRUE METRIX GO GLUCOSE METER) w/Device KIT 1 each by Does not apply route every 8 (eight) hours as needed.  . cholecalciferol (VITAMIN D3) 25 MCG (1000 UT) tablet Take 5,000 Units by mouth daily.  . clopidogrel (PLAVIX) 75 MG tablet Take 1 tablet (75 mg total) by mouth daily.  . cyclobenzaprine (FLEXERIL) 10 MG tablet TAKE ONE TABLET BY MOUTH TWICE A DAY AS NEEDED FOR MUSCLE SPASMS  . diclofenac sodium (VOLTAREN) 1 % GEL APPLY TWO GRAMS TOPICALLY FOUR TIMES A DAY  . Dimethyl Fumarate (TECFIDERA) 240 MG CPDR Take 1 capsule (240 mg total) by mouth 2 (two) times daily.  . DULoxetine (CYMBALTA) 30 MG capsule Take 1 capsule (30 mg total) by mouth daily.  Marland Kitchen ezetimibe (ZETIA) 10 MG tablet Take 1 tablet (10 mg total) by mouth daily.  . fluticasone (FLONASE) 50 MCG/ACT nasal spray PLACE ONE SPRAY INTO BOTH NOSTRILS DAILY AS NEEDED FOR ALLERGIES OR RHINITIS  .  glucose blood (TRUE METRIX BLOOD GLUCOSE TEST) test strip Check blood sugar once a day  . losartan (COZAAR) 25 MG tablet Take 1 tablet (25 mg total) by mouth daily.  . metoprolol tartrate (LOPRESSOR) 25 MG tablet Take 1 tablet (25 mg total) by mouth 2 (two) times daily.  . nitroGLYCERIN (NITROSTAT) 0.4 MG SL tablet Place 1 tablet (0.4 mg total) under the tongue every 5 (five) minutes x 3 doses as needed for chest pain.  . pregabalin (LYRICA) 25 MG capsule Take 1 capsule (25 mg total) by mouth 2 (two) times daily.  . TRUEPLUS LANCETS 26G  MISC 1 each by Does not apply route every 8 (eight) hours as needed.  . varenicline (CHANTIX STARTING MONTH PAK) 0.5 MG X 11 & 1 MG X 42 tablet one 0.5 mg tab PO once daily x 3 days, then one 0.5 mg tablet twice daily for 4 days, then increase to one 1 mg tablet twice daily.     Allergies:   Pork-derived products   Social History   Tobacco Use  . Smoking status: Current Every Day Smoker    Packs/day: 0.75    Years: 40.00    Pack years: 30.00    Types: Cigarettes  . Smokeless tobacco: Never Used  Substance Use Topics  . Alcohol use: Yes    Alcohol/week: 0.0 standard drinks    Comment: occasional  . Drug use: No     Family Hx: The patient's family history includes Cancer in her mother; HIV/AIDS in her brother; Hypertension in her mother; Multiple sclerosis in her daughter; Other in her father. There is no history of Breast cancer.  ROS:   Please see the history of present illness.    Smoking, anxiety, and sedentary. All other systems reviewed and are negative.   Prior CV studies:   The following studies were reviewed today:  None in interval.  Labs/Other Tests and Data Reviewed:    EKG:  No ECG reviewed.  Recent Labs: 08/21/2018: TSH 1.673 08/23/2018: Hemoglobin 14.0; Magnesium 2.0; Platelets 198 12/27/2018: ALT 18; BUN 11; Creatinine, Ser 1.41; Potassium 3.9; Sodium 142   Recent Lipid Panel Lab Results  Component Value Date/Time   CHOL 160 12/27/2018 08:53 AM   TRIG 59 12/27/2018 08:53 AM   HDL 56 12/27/2018 08:53 AM   CHOLHDL 2.9 12/27/2018 08:53 AM   CHOLHDL 3.1 08/21/2018 02:42 PM   LDLCALC 92 12/27/2018 08:53 AM    Wt Readings from Last 3 Encounters:  02/22/19 262 lb (118.8 kg)  12/27/18 260 lb (117.9 kg)  12/10/18 262 lb (118.8 kg)     Objective:    Vital Signs:  Ht 5' 4"  (1.626 m)   Wt 262 lb (118.8 kg)   LMP 04/21/2014   BMI 44.97 kg/m    VITAL SIGNS:  reviewed GEN:  no acute distress CARDIOVASCULAR:  no peripheral edema NEURO:  alert and  oriented x 3, no obvious focal deficit  ASSESSMENT & PLAN:    1. Essential hypertension   2. NSTEMI (non-ST elevated myocardial infarction) ([place])   3. CAD in native artery   4. Pure hypertriglyceridemia   5. Snoring   6. Tobacco abuse   7. Educated About Covid-19 Virus Infection    PLAN:  1. No recording today. Target is 130/80 mHg. 2. No recurrence of angina pectoris.  3. Secondary risk prevention discussed. 4. Target LDL < 70. 5. Needs eval for sleep apnea. 6. Significant and not committed.  Overall education and awareness concerning primary/secondary  risk prevention was discussed in detail: LDL less than 70, hemoglobin A1c less than 7, blood pressure target less than 130/80 mmHg, >150 minutes of moderate aerobic activity per week, avoidance of smoking, weight control (via diet and exercise), and continued surveillance/management of/for obstructive sleep apnea.   COVID-19 Education: The signs and symptoms of COVID-19 were discussed with the patient and how to seek care for testing (follow up with PCP or arrange E-visit).  The importance of social distancing was discussed today.  Time:   Today, I have spent 12 minutes with the patient with telehealth technology discussing the above problems.     Medication Adjustments/Labs and Tests Ordered: Current medicines are reviewed at length with the patient today.  Concerns regarding medicines are outlined above.   Tests Ordered: Orders Placed This Encounter  Procedures  . Hepatic function panel  . Lipid panel  . Split night study    Medication Changes: No orders of the defined types were placed in this encounter.   Follow Up:  In Person  In 6 months  Signed, Sinclair Grooms, MD  02/22/2019 1:50 PM    [place]

## 2019-02-22 ENCOUNTER — Telehealth: Payer: Self-pay | Admitting: *Deleted

## 2019-02-22 ENCOUNTER — Other Ambulatory Visit: Payer: Self-pay

## 2019-02-22 ENCOUNTER — Encounter: Payer: Self-pay | Admitting: Interventional Cardiology

## 2019-02-22 ENCOUNTER — Telehealth (INDEPENDENT_AMBULATORY_CARE_PROVIDER_SITE_OTHER): Payer: Medicare Other | Admitting: Interventional Cardiology

## 2019-02-22 VITALS — Ht 64.0 in | Wt 262.0 lb

## 2019-02-22 DIAGNOSIS — Z7189 Other specified counseling: Secondary | ICD-10-CM

## 2019-02-22 DIAGNOSIS — E781 Pure hyperglyceridemia: Secondary | ICD-10-CM

## 2019-02-22 DIAGNOSIS — I119 Hypertensive heart disease without heart failure: Secondary | ICD-10-CM

## 2019-02-22 DIAGNOSIS — Z720 Tobacco use: Secondary | ICD-10-CM

## 2019-02-22 DIAGNOSIS — I214 Non-ST elevation (NSTEMI) myocardial infarction: Secondary | ICD-10-CM | POA: Diagnosis not present

## 2019-02-22 DIAGNOSIS — I10 Essential (primary) hypertension: Secondary | ICD-10-CM

## 2019-02-22 DIAGNOSIS — I2510 Atherosclerotic heart disease of native coronary artery without angina pectoris: Secondary | ICD-10-CM

## 2019-02-22 DIAGNOSIS — R0683 Snoring: Secondary | ICD-10-CM

## 2019-02-22 NOTE — Telephone Encounter (Signed)
-----   Message from Loren Racer, LPN sent at 04/12/6552 12:11 PM EDT ----- Sleep study ordered.  Please pre cert

## 2019-02-22 NOTE — Telephone Encounter (Signed)
-----   Message from Loren Racer, LPN sent at 0/14/9969 12:11 PM EDT ----- Sleep study ordered.  Please pre cert

## 2019-02-22 NOTE — Telephone Encounter (Signed)
Staff message sent to Gae Bon ok schedule sleep study. Per chart patient has Medicaid and does not pre cert.

## 2019-02-22 NOTE — Patient Instructions (Signed)
Medication Instructions:  Your physician recommends that you continue on your current medications as directed. Please refer to the Current Medication list given to you today.  If you need a refill on your cardiac medications before your next appointment, please call your pharmacy.   Lab work: Your physician recommends that you return for lab work when fasting.  If you have labs (blood work) drawn today and your tests are completely normal, you will receive your results only by: Marland Kitchen MyChart Message (if you have MyChart) OR . A paper copy in the mail If you have any lab test that is abnormal or we need to change your treatment, we will call you to review the results.  Testing/Procedures: Your physician has recommended that you have a sleep study. This test records several body functions during sleep, including: brain activity, eye movement, oxygen and carbon dioxide blood levels, heart rate and rhythm, breathing rate and rhythm, the flow of air through your mouth and nose, snoring, body muscle movements, and chest and belly movement.   Follow-Up: At [facility], you and your health needs are our priority.  As part of our continuing mission to provide you with exceptional heart care, we have created designated Provider Care Teams.  These Care Teams include your primary Cardiologist (physician) and Advanced Practice Providers (APPs -  Physician Assistants and Nurse Practitioners) who all work together to provide you with the care you need, when you need it. You will need a follow up appointment in 6 months.  Please call our office 2 months in advance to schedule this appointment.  You may see Sinclair Grooms, MD or one of the following Advanced Practice Providers on your designated Care Team:   Truitt Merle, NP Cecilie Kicks, NP . Kathyrn Drown, NP  Any Other Special Instructions Will Be Listed Below (If Applicable).

## 2019-02-27 ENCOUNTER — Telehealth: Payer: Self-pay

## 2019-02-27 NOTE — Telephone Encounter (Signed)
    COVID-19 Pre-Screening Questions:  . In the past 7 to 10 days have you had a cough,  shortness of breath, headache, congestion, fever (100 or greater) body aches, chills, sore throat, or sudden loss of taste or sense of smell?  NO . Have you been around anyone with known Covid 19.  NO . Have you been around anyone who is awaiting Covid 19 test results in the past 7 to 10 days?  NO Have you been around anyone who has been exposed to Covid 19, or has mentioned symptoms of Covid 19 within the past 7 to 10 days?  NO  If you have any concerns/questions about symptoms patients report during screening (either on the phone or at threshold). Contact the provider seeing the patient or DOD for further guidance.  If neither are available contact a member of the leadership team.  kb

## 2019-02-28 ENCOUNTER — Other Ambulatory Visit: Payer: Self-pay

## 2019-02-28 ENCOUNTER — Other Ambulatory Visit: Payer: Medicare Other | Admitting: *Deleted

## 2019-02-28 DIAGNOSIS — I2510 Atherosclerotic heart disease of native coronary artery without angina pectoris: Secondary | ICD-10-CM

## 2019-02-28 DIAGNOSIS — I10 Essential (primary) hypertension: Secondary | ICD-10-CM

## 2019-02-28 DIAGNOSIS — E781 Pure hyperglyceridemia: Secondary | ICD-10-CM

## 2019-02-28 LAB — LIPID PANEL
Chol/HDL Ratio: 2.2 ratio (ref 0.0–4.4)
Cholesterol, Total: 107 mg/dL (ref 100–199)
HDL: 49 mg/dL (ref >39)
LDL Calculated: 47 mg/dL (ref 0–99)
Triglycerides: 55 mg/dL (ref 0–149)
VLDL Cholesterol Cal: 11 mg/dL (ref 5–40)

## 2019-02-28 LAB — HEPATIC FUNCTION PANEL
ALT: 14 IU/L (ref 0–32)
AST: 15 IU/L (ref 0–40)
Albumin: 4 g/dL (ref 3.8–4.9)
Alkaline Phosphatase: 91 IU/L (ref 39–117)
Bilirubin Total: 0.5 mg/dL (ref 0.0–1.2)
Bilirubin, Direct: 0.17 mg/dL (ref 0.00–0.40)
Total Protein: 6.8 g/dL (ref 6.0–8.5)

## 2019-03-04 ENCOUNTER — Telehealth: Payer: Medicaid Other | Admitting: Interventional Cardiology

## 2019-03-05 ENCOUNTER — Telehealth: Payer: Self-pay | Admitting: *Deleted

## 2019-03-05 NOTE — Telephone Encounter (Signed)
Patient is scheduled for lab study on 03/25/19. Patient understands her sleep study will be done at [facility] sleep lab. Patient understands she will receive a sleep packet in a week or so. Patient understands to call if she does not receive the sleep packet in a timely manner. Patient agrees with treatment and thanked me for call. Patient understands if she has any HX of allergies a covid test will be required.

## 2019-03-05 NOTE — Telephone Encounter (Signed)
-----   Message from Lauralee Evener, [place] sent at 02/22/2019  3:42 PM EDT ----- Patient has Medicaid and does not require a precert. Ok to schedule sleep study. ----- Message ----- From: Loren Racer, LPN Sent: 6/82/5749  12:11 PM EDT To: Freada Bergeron, CMA, Cv [facility] Sleep Studies  Sleep study ordered.  Please pre cert

## 2019-03-12 ENCOUNTER — Other Ambulatory Visit: Payer: Self-pay

## 2019-03-12 MED ORDER — TECFIDERA 240 MG PO CPDR: 240.0000 mg | DELAYED_RELEASE_CAPSULE | Freq: Two times a day (BID) | ORAL | 3 refills | Status: DC

## 2019-03-22 ENCOUNTER — Ambulatory Visit: Payer: Medicaid Other | Admitting: Interventional Cardiology

## 2019-03-22 ENCOUNTER — Other Ambulatory Visit (HOSPITAL_COMMUNITY): Payer: Medicaid Other

## 2019-03-25 ENCOUNTER — Encounter (HOSPITAL_BASED_OUTPATIENT_CLINIC_OR_DEPARTMENT_OTHER): Payer: Medicaid Other

## 2019-03-27 ENCOUNTER — Other Ambulatory Visit: Payer: Self-pay | Admitting: Internal Medicine

## 2019-03-27 DIAGNOSIS — M79604 Pain in right leg: Secondary | ICD-10-CM

## 2019-03-27 DIAGNOSIS — Z720 Tobacco use: Secondary | ICD-10-CM

## 2019-03-27 DIAGNOSIS — G35 Multiple sclerosis: Secondary | ICD-10-CM

## 2019-03-29 ENCOUNTER — Other Ambulatory Visit: Payer: Self-pay

## 2019-03-29 ENCOUNTER — Ambulatory Visit (HOSPITAL_BASED_OUTPATIENT_CLINIC_OR_DEPARTMENT_OTHER): Payer: Medicare Other | Admitting: Internal Medicine

## 2019-03-29 ENCOUNTER — Encounter: Payer: Self-pay | Admitting: Internal Medicine

## 2019-03-29 ENCOUNTER — Other Ambulatory Visit (HOSPITAL_COMMUNITY)
Admission: RE | Admit: 2019-03-29 | Discharge: 2019-03-29 | Disposition: A | Discharge status: Home / Self Care | Payer: Medicare Other | Source: Ambulatory Visit | Attending: Internal Medicine | Admitting: Internal Medicine

## 2019-03-29 VITALS — BP 140/100 | HR 77 | Temp 98.1°F | Resp 16 | Wt 263.2 lb

## 2019-03-29 DIAGNOSIS — G35 Multiple sclerosis: Secondary | ICD-10-CM

## 2019-03-29 DIAGNOSIS — F17210 Nicotine dependence, cigarettes, uncomplicated: Secondary | ICD-10-CM

## 2019-03-29 DIAGNOSIS — Z6841 Body Mass Index (BMI) 40.0 and over, adult: Secondary | ICD-10-CM

## 2019-03-29 DIAGNOSIS — Z79899 Other long term (current) drug therapy: Secondary | ICD-10-CM | POA: Diagnosis not present

## 2019-03-29 DIAGNOSIS — L659 Nonscarring hair loss, unspecified: Secondary | ICD-10-CM

## 2019-03-29 DIAGNOSIS — I2510 Atherosclerotic heart disease of native coronary artery without angina pectoris: Secondary | ICD-10-CM | POA: Insufficient documentation

## 2019-03-29 DIAGNOSIS — N760 Acute vaginitis: Secondary | ICD-10-CM | POA: Insufficient documentation

## 2019-03-29 DIAGNOSIS — Z7902 Long term (current) use of antithrombotics/antiplatelets: Secondary | ICD-10-CM | POA: Diagnosis not present

## 2019-03-29 DIAGNOSIS — I252 Old myocardial infarction: Secondary | ICD-10-CM | POA: Diagnosis not present

## 2019-03-29 DIAGNOSIS — N183 Chronic kidney disease, stage 3 (moderate): Secondary | ICD-10-CM | POA: Insufficient documentation

## 2019-03-29 DIAGNOSIS — G35D Multiple sclerosis, unspecified: Secondary | ICD-10-CM

## 2019-03-29 DIAGNOSIS — L989 Disorder of the skin and subcutaneous tissue, unspecified: Secondary | ICD-10-CM | POA: Diagnosis not present

## 2019-03-29 DIAGNOSIS — L729 Follicular cyst of the skin and subcutaneous tissue, unspecified: Secondary | ICD-10-CM

## 2019-03-29 DIAGNOSIS — E1159 Type 2 diabetes mellitus with other circulatory complications: Secondary | ICD-10-CM | POA: Insufficient documentation

## 2019-03-29 DIAGNOSIS — M797 Fibromyalgia: Secondary | ICD-10-CM | POA: Diagnosis not present

## 2019-03-29 DIAGNOSIS — Z7982 Long term (current) use of aspirin: Secondary | ICD-10-CM | POA: Diagnosis not present

## 2019-03-29 DIAGNOSIS — I129 Hypertensive chronic kidney disease with stage 1 through stage 4 chronic kidney disease, or unspecified chronic kidney disease: Secondary | ICD-10-CM | POA: Insufficient documentation

## 2019-03-29 DIAGNOSIS — I10 Essential (primary) hypertension: Secondary | ICD-10-CM | POA: Diagnosis not present

## 2019-03-29 DIAGNOSIS — L728 Other follicular cysts of the skin and subcutaneous tissue: Secondary | ICD-10-CM

## 2019-03-29 DIAGNOSIS — E1122 Type 2 diabetes mellitus with diabetic chronic kidney disease: Secondary | ICD-10-CM | POA: Insufficient documentation

## 2019-03-29 DIAGNOSIS — Z720 Tobacco use: Secondary | ICD-10-CM

## 2019-03-29 LAB — POCT GLYCOSYLATED HEMOGLOBIN (HGB A1C): HbA1c, POC (controlled diabetic range): 6.7 % (ref 0.0–7.0)

## 2019-03-29 LAB — GLUCOSE, POCT (MANUAL RESULT ENTRY): POC Glucose: 113 mg/dl — AB (ref 70–99)

## 2019-03-29 MED ORDER — CYCLOBENZAPRINE HCL 10 MG PO TABS: 10.0000 mg | ORAL_TABLET | Freq: Two times a day (BID) | ORAL | 4 refills | Status: DC | PRN

## 2019-03-29 NOTE — Patient Instructions (Signed)
Your blood pressure is not at goal.  Goal is 130/80 or lower.  Please continue to take your blood pressure medicines daily.  I have given you a prescription to get a blood pressure monitoring device.  Continue to work on trying to quit the cigarettes completely.  I have submitted a referral for you to see the dermatologist.

## 2019-03-29 NOTE — Progress Notes (Signed)
Patient ID: Tiffany Brady, female    DOB: 1961/09/06  MRN: [id number]  CC: Hypertension and Diabetes   Subjective: Ethyl Vila is a 58 y.o. female who presents for chronic ds management Her concerns today include:  Pt with hx of DM type 2, HTN, Tob, CAD, obesity, fibromyalgia, CKD stage 3 and MS.    MS:  "Both of my legs have been killing me."  Still gets bad spasms. Takes Flexeril 2 at bedtime and none during the day.  Denies any recent falls.  HTN:  Did no get device Did not take meds as yet for today limits salt in her foods Denies any chest pains or shortness of breath.  No lower extremity edema.  No headaches or dizziness.  DM: has cut back on amount of sugar that she uses in her [place]. Splenda too expensive.  Admits she loves cakes and pies.  Loves fruits -c/o decrease appetite but has not lost any weight. No blurred vision.  No numbness in the hands or feet  Tob dep:  1 pk now last 3 days instead of 2 days.  Less craving.  Taking Chantix as prescribed on last visit and finding it helpful  C/o having a bald spot on scalp on RT side x 2 yrs.  It has not inc size.  Also concerned about 3 small areas on LT buttock that she can feel when she rubs her fingers over the area.  They have not increased in size and have been there for a while.  They are not painful.  Complains of burning in the vaginal area when she has sex.  Small amount white dischg at time   Patient Active Problem List   Diagnosis Date Noted  . Snoring 12/10/2018  . NSTEMI (non-ST elevated myocardial infarction) ([place]) 08/21/2018  . De Quervain's disease (tenosynovitis) 08/02/2018  . Body mass index 40.0-44.9, adult ([place]) 08/02/2018  . Asymmetrical hearing loss of both ears 01/03/2018  . Stress incontinence 05/08/2017  . Cervical radiculopathy 05/08/2017  . CKD (chronic kidney disease) stage 3, GFR 30-59 ml/min (HCC) 04/04/2017  . Vitamin D deficiency 04/04/2017  . Controlled type 2 diabetes  mellitus without complication, without long-term current use of insulin ([place]) 04/04/2017  . Fibromyalgia 03/28/2016  . Multiple sclerosis ([place]) 11/02/2015  . Tobacco abuse 04/24/2014  . Back muscle spasm 01/28/2014  . Migraine headache 07/23/2013  . Essential hypertension 06/27/2013  . Chronic pain syndrome 06/27/2013  . Morbid obesity ([place]) 06/27/2013     Current Outpatient Medications on File Prior to Visit  Medication Sig Dispense Refill  . amLODipine (NORVASC) 5 MG tablet Take 1 tablet (5 mg total) by mouth daily. 30 tablet 6  . aspirin EC 81 MG tablet Take 1 tablet (81 mg total) by mouth daily. 100 tablet 1  . atorvastatin (LIPITOR) 80 MG tablet TAKE 1 TABLET (80 MG TOTAL) BY MOUTH DAILY AT 6 PM. 30 tablet 9  . Blood Glucose Monitoring Suppl (TRUE METRIX GO GLUCOSE METER) w/Device KIT 1 each by Does not apply route every 8 (eight) hours as needed. 1 kit 0  . cholecalciferol (VITAMIN D3) 25 MCG (1000 UT) tablet Take 5,000 Units by mouth daily.    . clopidogrel (PLAVIX) 75 MG tablet Take 1 tablet (75 mg total) by mouth daily. 30 tablet 11  . cyclobenzaprine (FLEXERIL) 10 MG tablet TAKE ONE TABLET BY MOUTH TWICE A DAY AS NEEDED FOR MUSCLE SPASMS 60 tablet 0  . diclofenac sodium (VOLTAREN) 1 % GEL APPLY  TWO GRAMS TOPICALLY FOUR TIMES A DAY 100 g 2  . Dimethyl Fumarate (TECFIDERA) 240 MG CPDR Take 1 capsule (240 mg total) by mouth 2 (two) times daily. 180 capsule 3  . DULoxetine (CYMBALTA) 30 MG capsule Take 1 capsule (30 mg total) by mouth daily. 30 capsule 2  . ezetimibe (ZETIA) 10 MG tablet Take 1 tablet (10 mg total) by mouth daily. 30 tablet 11  . fluticasone (FLONASE) 50 MCG/ACT nasal spray PLACE ONE SPRAY INTO BOTH NOSTRILS DAILY AS NEEDED FOR ALLERGIES OR RHINITIS 16 g 2  . glucose blood (TRUE METRIX BLOOD GLUCOSE TEST) test strip Check blood sugar once a day 100 each 12  . losartan (COZAAR) 25 MG tablet Take 1 tablet (25 mg total) by mouth daily. 30 tablet 6  . metoprolol tartrate  (LOPRESSOR) 25 MG tablet Take 1 tablet (25 mg total) by mouth 2 (two) times daily. 60 tablet 6  . nitroGLYCERIN (NITROSTAT) 0.4 MG SL tablet Place 1 tablet (0.4 mg total) under the tongue every 5 (five) minutes x 3 doses as needed for chest pain. 25 tablet 12  . pregabalin (LYRICA) 25 MG capsule Take 1 capsule (25 mg total) by mouth 2 (two) times daily. 60 capsule 2  . TRUEPLUS LANCETS 26G MISC 1 each by Does not apply route every 8 (eight) hours as needed. 100 each 12  . varenicline (CHANTIX STARTING MONTH PAK) 0.5 MG X 11 & 1 MG X 42 tablet one 0.5 mg tab PO once daily x 3 days, then one 0.5 mg tablet twice daily for 4 days, then increase to one 1 mg tablet twice daily. 53 tablet 0   No current facility-administered medications on file prior to visit.     Allergies  Allergen Reactions  . Pork-Derived Metallurgist    Social History   Socioeconomic History  . Marital status: Widowed    Spouse name: Not on file  . Number of children: Not on file  . Years of education: Not on file  . Highest education level: Not on file  Occupational History  . Not on file  Social Needs  . Financial resource strain: Not on file  . Food insecurity    Worry: Not on file    Inability: Not on file  . Transportation needs    Medical: Not on file    Non-medical: Not on file  Tobacco Use  . Smoking status: Current Every Day Smoker    Packs/day: 0.75    Years: 40.00    Pack years: 30.00    Types: Cigarettes  . Smokeless tobacco: Never Used  Substance and Sexual Activity  . Alcohol use: Yes    Alcohol/week: 0.0 standard drinks    Comment: occasional  . Drug use: No  . Sexual activity: Not on file  Lifestyle  . Physical activity    Days per week: Not on file    Minutes per session: Not on file  . Stress: Not on file  Relationships  . Social Herbalist on phone: Not on file    Gets together: Not on file    Attends religious service: Not on file    Active member of club or  organization: Not on file    Attends meetings of clubs or organizations: Not on file    Relationship status: Not on file  . Intimate partner violence    Fear of current or ex partner: Not on file    Emotionally abused: Not on file  Physically abused: Not on file    Forced sexual activity: Not on file  Other Topics Concern  . Not on file  Social History Narrative   Lives with niece and her 3 children in a 2 story home.     Only goes upstairs once a day.  Has 1 child.  Does not work.  Trying to get disability.  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     Education: 10th grade.    Family History  Problem Relation Age of Onset  . Cancer Mother        Deceased  . Hypertension Mother   . Other Father        Deceased, 42  . HIV/AIDS Brother        Deceased  . Multiple sclerosis Daughter   . Breast cancer Neg Hx     Past Surgical History:  Procedure Laterality Date  . FOOT SURGERY Right   . LEFT HEART CATH AND CORONARY ANGIOGRAPHY N/A 08/22/2018   Procedure: LEFT HEART CATH AND CORONARY ANGIOGRAPHY;  Surgeon: Sherren Mocha, MD;  Location: [facility];  Service: Cardiovascular;  Laterality: N/A;    ROS: Review of Systems Negative except as stated above  PHYSICAL EXAM: BP (!) 145/87   Pulse 77   Temp 98.1 F (36.7 C) (Oral)   Resp 16   Wt 263 lb 3.2 oz (119.4 kg)   LMP 04/21/2014   SpO2 95%   BMI 45.18 kg/m   Wt Readings from Last 3 Encounters:  03/29/19 263 lb 3.2 oz (119.4 kg)  02/22/19 262 lb (118.8 kg)  12/27/18 260 lb (117.9 kg)  Repeat blood pressure 140/100  Physical Exam  General appearance - alert, well appearing, and in no distress Mental status - normal mood, behavior, speech, dress, motor activity, and thought processes Mouth - mucous membranes moist, pharynx normal without lesions Neck - supple, no significant adenopathy Chest - clear to auscultation, no wheezes, rales or rhonchi, symmetric air entry Heart - normal rate,  regular rhythm, normal S1, S2, no murmurs, rubs, clicks or gallops Musculoskeletal -she ambulates with a cane.  Transfers without difficulty from chair to exam table. Extremities - peripheral pulses normal, no pedal edema, no clubbing or cyanosis Skin -she has about a 4 cm bald spot in the right parietal area.  She has her hair gel down. Right buttock: 2 small pea-sized subcutaneous cyst felt in the soft tissue in the area of concern   CMP Latest Ref Rng & Units 02/28/2019 12/27/2018 09/07/2018  Glucose 65 - 99 mg/dL - 129(H) 114(H)  BUN 6 - 24 mg/dL - 11 13  Creatinine 0.57 - 1.00 mg/dL - 1.41(H) 1.17(H)  Sodium 134 - 144 mmol/L - 142 140  Potassium 3.5 - 5.2 mmol/L - 3.9 4.2  Chloride 96 - 106 mmol/L - 104 100  CO2 20 - 29 mmol/L - 21 23  Calcium 8.7 - 10.2 mg/dL - 9.4 9.6  Total Protein 6.0 - 8.5 g/dL 6.8 7.4 -  Total Bilirubin 0.0 - 1.2 mg/dL 0.5 0.6 -  Alkaline Phos 39 - 117 IU/L 91 88 -  AST 0 - 40 IU/L 15 14 -  ALT 0 - 32 IU/L 14 18 -   Lipid Panel     Component Value Date/Time   CHOL 107 02/28/2019 0746   TRIG 55 02/28/2019 0746   HDL 49 02/28/2019 0746   CHOLHDL 2.2 02/28/2019 0746   CHOLHDL 3.1 08/21/2018 1442   VLDL  6 08/21/2018 1442   LDLCALC 47 02/28/2019 0746    CBC    Component Value Date/Time   WBC 7.2 08/23/2018 0246   RBC 4.67 08/23/2018 0246   HGB 14.0 08/23/2018 0246   HGB 15.1 01/18/2018 0926   HCT 42.8 08/23/2018 0246   HCT 45.2 01/18/2018 0926   PLT 198 08/23/2018 0246   PLT 214 01/18/2018 0926   MCV 91.6 08/23/2018 0246   MCV 92 01/18/2018 0926   MCH 30.0 08/23/2018 0246   MCHC 32.7 08/23/2018 0246   RDW 13.0 08/23/2018 0246   RDW 14.0 01/18/2018 0926   LYMPHSABS 2.6 09/22/2017 1147   MONOABS 0.6 09/22/2017 1147   EOSABS 0.6 09/22/2017 1147   BASOSABS 0.1 09/22/2017 1147   Results for orders placed or performed in visit on 03/29/19  POCT glucose (manual entry)  Result Value Ref Range   POC Glucose 113 (A) 70 - 99 mg/dl  POCT  glycosylated hemoglobin (Hb A1C)  Result Value Ref Range   Hemoglobin A1C     HbA1c POC (<> result, manual entry)     HbA1c, POC (prediabetic range)     HbA1c, POC (controlled diabetic range) 6.7 0.0 - 7.0 %    ASSESSMENT AND PLAN: 1. Controlled type 2 diabetes mellitus with other circulatory complication, without long-term current use of insulin (HCC) -A1c and blood sugar look good. Dietary counseling given.  Encouraged her to eat healthier snacks instead of cakes and pies. Encouraged her to try to walk if only for 10 minutes every day - POCT glucose (manual entry) - POCT glycosylated hemoglobin (Hb A1C) - Ambulatory referral to Ophthalmology  2. Multiple sclerosis ([place]) Recommend that she take Flexeril during the day and 1 at night.  Keep her appointment with neurology - cyclobenzaprine (FLEXERIL) 10 MG tablet; Take 1 tablet (10 mg total) by mouth 2 (two) times daily as needed for muscle spasms.  Dispense: 60 tablet; Refill: 4  3. Essential hypertension Not at goal but has not taken medicines as yet for today.  I have given her a handwritten prescription to get the blood pressure monitoring device which Medicaid pays for.  Advised that blood pressure goal is 130/80 or lower.  4. Tobacco abuse Advised to quit.  Commended her on cutting down.  Now that she is on Chantix, I have encouraged her to set a quit date.  Less than 5 minutes spent on counseling  5. Alopecia - Ambulatory referral to Dermatology  6. Subcutaneous cyst Observe for now  7. Acute vaginitis - Cervicovaginal ancillary only  8. Body mass index (BMI) 45.0-49.9, adult [facility]) See #1 above    Patient was given the opportunity to ask questions.  Patient verbalized understanding of the plan and was able to repeat key elements of the plan.   Orders Placed This Encounter  Procedures  . POCT glucose (manual entry)  . POCT glycosylated hemoglobin (Hb A1C)     Requested Prescriptions    No prescriptions  requested or ordered in this encounter    No follow-ups on file.  Karle Plumber, MD, FACP

## 2019-04-01 ENCOUNTER — Other Ambulatory Visit: Payer: Self-pay | Admitting: Internal Medicine

## 2019-04-01 DIAGNOSIS — M79604 Pain in right leg: Secondary | ICD-10-CM

## 2019-04-01 DIAGNOSIS — E119 Type 2 diabetes mellitus without complications: Secondary | ICD-10-CM

## 2019-04-01 DIAGNOSIS — M79605 Pain in left leg: Secondary | ICD-10-CM

## 2019-04-01 LAB — CERVICOVAGINAL ANCILLARY ONLY
Bacterial vaginitis: NEGATIVE
Candida vaginitis: POSITIVE — AB
Chlamydia: NEGATIVE
Neisseria Gonorrhea: NEGATIVE
Trichomonas: NEGATIVE

## 2019-04-02 ENCOUNTER — Telehealth: Payer: Self-pay | Admitting: Internal Medicine

## 2019-04-02 ENCOUNTER — Other Ambulatory Visit: Payer: Self-pay | Admitting: Internal Medicine

## 2019-04-02 ENCOUNTER — Telehealth: Payer: Self-pay

## 2019-04-02 MED ORDER — FLUCONAZOLE 150 MG PO TABS: 150.0000 mg | ORAL_TABLET | Freq: Once | ORAL | 0 refills | Status: AC

## 2019-04-02 NOTE — Telephone Encounter (Signed)
Contacted pt to go over results pt is aware and doesn't have any questions or concerns  

## 2019-04-02 NOTE — Telephone Encounter (Signed)
Pt call about the medication provider is getting ready to send her. Pt says she has a question about the medication.

## 2019-04-02 NOTE — Telephone Encounter (Signed)
Returned pt call and made pt aware that the diflucan is a 1 time dose pt states she understands   Pt is wanting to know what can she use for the bumps that she keeps getting. Pt states the bumps are getting worse and she is starting to scratch. I informed pt that Dr. Wynetta Emery did refer her to dermatology but pt is wanting to know if there is any cream etc.

## 2019-04-03 NOTE — Telephone Encounter (Signed)
She can use OTC hydrocortisone until her visit with dermatology

## 2019-04-03 NOTE — Telephone Encounter (Signed)
Contacted pt to go over Dr. Margarita Rana response pt is aware and doesn't have any questions or concerns

## 2019-04-05 ENCOUNTER — Telehealth: Payer: Self-pay | Admitting: Neurology

## 2019-04-05 NOTE — Telephone Encounter (Signed)
1) Medication(s) Requested (by name): glucose blood (TRUE METRIX BLOOD GLUCOSE TEST) test strip [[id number]   2) Pharmacy of Choice:  [facility], [address]   3) Special Requests:   Approved medications will be sent to the pharmacy, we will reach out if there is an issue.  Requests made after 3pm may not be addressed until the following business day!  If a patient is unsure of the name of the medication(s) please note and ask patient to call back when they are able to provide all info, do not send to responsible party until all information is available!

## 2019-04-05 NOTE — Telephone Encounter (Signed)
Pt has refills available at the pharmacy

## 2019-04-17 ENCOUNTER — Other Ambulatory Visit: Payer: Self-pay | Admitting: Internal Medicine

## 2019-04-17 DIAGNOSIS — J301 Allergic rhinitis due to pollen: Secondary | ICD-10-CM

## 2019-04-17 DIAGNOSIS — M654 Radial styloid tenosynovitis [de Quervain]: Secondary | ICD-10-CM

## 2019-04-23 LAB — HM DIABETES EYE EXAM

## 2019-05-16 ENCOUNTER — Other Ambulatory Visit: Payer: Self-pay | Admitting: Internal Medicine

## 2019-05-16 DIAGNOSIS — M797 Fibromyalgia: Secondary | ICD-10-CM

## 2019-06-03 ENCOUNTER — Other Ambulatory Visit: Payer: Self-pay | Admitting: Internal Medicine

## 2019-06-03 DIAGNOSIS — Z1231 Encounter for screening mammogram for malignant neoplasm of breast: Secondary | ICD-10-CM

## 2019-06-07 ENCOUNTER — Other Ambulatory Visit: Payer: Self-pay | Admitting: Internal Medicine

## 2019-06-07 DIAGNOSIS — J301 Allergic rhinitis due to pollen: Secondary | ICD-10-CM

## 2019-06-07 DIAGNOSIS — M654 Radial styloid tenosynovitis [de Quervain]: Secondary | ICD-10-CM

## 2019-07-15 ENCOUNTER — Other Ambulatory Visit: Payer: Self-pay | Admitting: Internal Medicine

## 2019-07-15 ENCOUNTER — Other Ambulatory Visit: Payer: Self-pay | Admitting: Physician Assistant

## 2019-07-15 DIAGNOSIS — I10 Essential (primary) hypertension: Secondary | ICD-10-CM

## 2019-07-15 DIAGNOSIS — M797 Fibromyalgia: Secondary | ICD-10-CM

## 2019-07-18 ENCOUNTER — Ambulatory Visit
Admission: RE | Admit: 2019-07-18 | Discharge: 2019-07-18 | Disposition: A | Discharge status: Home / Self Care | Payer: Medicare Other | Source: Ambulatory Visit | Attending: Internal Medicine | Admitting: Internal Medicine

## 2019-07-18 ENCOUNTER — Other Ambulatory Visit: Payer: Self-pay

## 2019-07-18 DIAGNOSIS — Z1231 Encounter for screening mammogram for malignant neoplasm of breast: Secondary | ICD-10-CM

## 2019-07-31 ENCOUNTER — Telehealth: Payer: Self-pay | Admitting: Internal Medicine

## 2019-07-31 DIAGNOSIS — N950 Postmenopausal bleeding: Secondary | ICD-10-CM

## 2019-07-31 NOTE — Telephone Encounter (Signed)
Tiffany Brady please schedule pt an appointment with any provider

## 2019-07-31 NOTE — Telephone Encounter (Signed)
Patient called requesting to speak with her PCP because she is having vaginal bleeding. Patient states she is on her 8th day and stated she is suppose to be going through menopause. Please f/u

## 2019-07-31 NOTE — Telephone Encounter (Signed)
Will forward to pcp

## 2019-08-01 NOTE — Telephone Encounter (Signed)
Pt scheduled w/ Wynetta Emery and aware of referral.

## 2019-08-12 ENCOUNTER — Other Ambulatory Visit: Payer: Self-pay

## 2019-08-12 ENCOUNTER — Encounter: Payer: Self-pay | Admitting: Internal Medicine

## 2019-08-12 ENCOUNTER — Ambulatory Visit: Payer: Medicare Other | Attending: Internal Medicine | Admitting: Internal Medicine

## 2019-08-12 DIAGNOSIS — G8929 Other chronic pain: Secondary | ICD-10-CM

## 2019-08-12 DIAGNOSIS — I10 Essential (primary) hypertension: Secondary | ICD-10-CM | POA: Diagnosis not present

## 2019-08-12 DIAGNOSIS — F17210 Nicotine dependence, cigarettes, uncomplicated: Secondary | ICD-10-CM | POA: Diagnosis not present

## 2019-08-12 DIAGNOSIS — E1159 Type 2 diabetes mellitus with other circulatory complications: Secondary | ICD-10-CM | POA: Diagnosis not present

## 2019-08-12 DIAGNOSIS — N950 Postmenopausal bleeding: Secondary | ICD-10-CM

## 2019-08-12 DIAGNOSIS — M25512 Pain in left shoulder: Secondary | ICD-10-CM

## 2019-08-12 DIAGNOSIS — Z720 Tobacco use: Secondary | ICD-10-CM

## 2019-08-12 DIAGNOSIS — Z6841 Body Mass Index (BMI) 40.0 and over, adult: Secondary | ICD-10-CM

## 2019-08-12 DIAGNOSIS — Z23 Encounter for immunization: Secondary | ICD-10-CM

## 2019-08-12 MED ORDER — CHANTIX STARTING MONTH PAK 0.5 MG X 11 & 1 MG X 42 PO TABS: ORAL_TABLET | ORAL | 0 refills | Status: DC

## 2019-08-12 NOTE — Progress Notes (Signed)
Virtual Visit via Telephone Note Due to current restrictions/limitations of in-office visits due to the COVID-19 pandemic, this scheduled clinical appointment was converted to a telehealth visit  I connected with [name] on 08/12/19 at 3:40 p.m  by telephone and verified that I am speaking with the correct person using two identifiers. I am in my office.  The patient is at home.  Only the patient and myself participated in this encounter.  I discussed the limitations, risks, security and privacy concerns of performing an evaluation and management service by telephone and the availability of in person appointments. I also discussed with the patient that there may be a patient responsible charge related to this service. The patient expressed understanding and agreed to proceed.   History of Present Illness: Pt with hx of DM type 2, HTN, Tob,CAD,obesity, fibromyalgia, CKD stage 3 and MS.patient last evaluated 03/2019.  Purpose of today's visit is chronic disease management follow-up.  Patient had called earlier this month complaining of postmenopausal bleeding that was going on for 8 days. -pt states she was having periods every several mths since her birthday of last yr that last 3-5 days.  Prior to that she did not have menses in over 1 yr  DM:  NP from her insurance came to her house this past Saturday.  A1C was 6.3 -"I stay eating up everything."  She over ate over thanksgiving.  Ate a lot of pies/cakes and stuff.  Using Splenda in her [place] now.  Drinks more water. Most recent wgh was 270 lbs.  Gained 7 lbs since last visit  HTN/CAD:  Did not get BP monitoring device but plans to order one from a book given by her insurance.  BP checked by NP this weekend and it was good.  She tells me that the nurse practitioner told her that she does not have "a heart beat" on the left side of her neck and she wants to know what that might mean. -compliant with meds  Including Plavix and Lipitor  and salt restriction -no CP but intermittent swelling in LT shoulder. Worse the more she uses the arm during the day.  No known injury.  Requesting something for pain.  She is on Cymbalta and Flexeril.  Tob dep:  Still at 1/3 pk a day.  She stopped the Chantix but plans to restart it.  HM: would like to get flu shot  Current Outpatient Medications on File Prior to Visit  Medication Sig Dispense Refill  . Accu-Chek Softclix Lancets lancets AS DIRECTED DAILY 100 each 11  . amLODipine (NORVASC) 5 MG tablet Take 1 tablet (5 mg total) by mouth daily. 30 tablet 6  . aspirin EC 81 MG tablet Take 1 tablet (81 mg total) by mouth daily. 100 tablet 1  . atorvastatin (LIPITOR) 80 MG tablet TAKE 1 TABLET (80 MG TOTAL) BY MOUTH DAILY AT 6 PM. 30 tablet 9  . Blood Glucose Monitoring Suppl (TRUE METRIX GO GLUCOSE METER) w/Device KIT 1 each by Does not apply route every 8 (eight) hours as needed. 1 kit 0  . cholecalciferol (VITAMIN D3) 25 MCG (1000 UT) tablet Take 5,000 Units by mouth daily.    . clopidogrel (PLAVIX) 75 MG tablet TAKE ONE TABLET BY MOUTH DAILY 30 tablet 6  . cyclobenzaprine (FLEXERIL) 10 MG tablet Take 1 tablet (10 mg total) by mouth 2 (two) times daily as needed for muscle spasms. 60 tablet 4  . diclofenac sodium (VOLTAREN) 1 % GEL APPLY TWO GRAMS TOPICALLY  FOUR TIMES A DAY 100 g 0  . Dimethyl Fumarate (TECFIDERA) 240 MG CPDR Take 1 capsule (240 mg total) by mouth 2 (two) times daily. 180 capsule 3  . DULoxetine (CYMBALTA) 30 MG capsule Take 1 capsule (30 mg total) by mouth daily. Must have office visit for refills 30 capsule 0  . ezetimibe (ZETIA) 10 MG tablet Take 1 tablet (10 mg total) by mouth daily. 30 tablet 11  . fluticasone (FLONASE) 50 MCG/ACT nasal spray PLACE ONE SPRAY INTO BOTH NOSTRILS DAILY AS NEEDED FOR ALLERGIES OR RHINITIS 16 g 0  . glucose blood (TRUE METRIX BLOOD GLUCOSE TEST) test strip Check blood sugar once a day 100 each 12  . losartan (COZAAR) 25 MG tablet Take 1  tablet (25 mg total) by mouth daily. Must have office visit for refills 30 tablet 0  . metoprolol tartrate (LOPRESSOR) 25 MG tablet Take 1 tablet (25 mg total) by mouth 2 (two) times daily. 60 tablet 6  . nitroGLYCERIN (NITROSTAT) 0.4 MG SL tablet Place 1 tablet (0.4 mg total) under the tongue every 5 (five) minutes x 3 doses as needed for chest pain. 25 tablet 12  . pregabalin (LYRICA) 25 MG capsule Take 1 capsule (25 mg total) by mouth 2 (two) times daily. 60 capsule 2  . varenicline (CHANTIX STARTING MONTH PAK) 0.5 MG X 11 & 1 MG X 42 tablet one 0.5 mg tab PO once daily x 3 days, then one 0.5 mg tablet twice daily for 4 days, then increase to one 1 mg tablet twice daily. 53 tablet 0   No current facility-administered medications on file prior to visit.       Observations/Objective: No direct observation done as this was a telephone encounter.  Assessment and Plan: 1. Postmenopausal bleeding -Patient advised that this is not normal bleeding. I have referred her to gynecology.  She has an appointment on January 4.  Patient wants to know whether the gynecologist is in her network.  I told I will have our referral coordinator call her back in regards to that information. - US Pelvic Complete With Transvaginal; Future  2. Controlled type 2 diabetes mellitus with other circulatory complication, without long-term current use of insulin ([place]) At goal. Dietary counseling given.  Encouraged her to avoid snacking on junk foods and to purchase some foods of vegetables to snack on instead.  3. Essential hypertension Patient reports recent blood pressure check by home visit from nurse practitioner was normal.  She will continue her current medications and low-salt diet  4. Tobacco abuse Advised to quit.  She has not made any progress since we last spoke in the summer.  However she is still wanting to quit.  She plans to restart Chantix.  Less than 5 minutes spent on counseling. - varenicline  (CHANTIX STARTING MONTH PAK) 0.5 MG X 11 & 1 MG X 42 tablet; one 0.5 mg tab PO once daily x 3 days, then one 0.5 mg tablet twice daily for 4 days, then increase to one 1 mg tablet twice daily.  Dispense: 53 tablet; Refill: 0  5. Class 3 severe obesity due to excess calories with serious comorbidity and body mass index (BMI) of 45.0 to 49.9 in adult [facility]) See #2 above  6. Chronic left shoulder pain We will bring her in in several weeks to take a look at this and also to listen to the carotid on the left side.  In the meantime I recommended using Tylenol as needed.  7.  Need for influenza vaccination CMA will schedule appointment for her to come in to have her flu shot   Follow Up Instructions: 4 wks to eval her LT shoulder swelling/pain   I discussed the assessment and treatment plan with the patient. The patient was provided an opportunity to ask questions and all were answered. The patient agreed with the plan and demonstrated an understanding of the instructions.   The patient was advised to call back or seek an in-person evaluation if the symptoms worsen or if the condition fails to improve as anticipated.  I provided 24 minutes of non-face-to-face time during this encounter.   Karle Plumber, MD

## 2019-08-14 ENCOUNTER — Ambulatory Visit: Payer: Medicare Other | Admitting: Pharmacist

## 2019-08-16 ENCOUNTER — Ambulatory Visit (HOSPITAL_COMMUNITY): Payer: Medicare Other

## 2019-08-20 ENCOUNTER — Telehealth: Payer: Self-pay | Admitting: Internal Medicine

## 2019-08-20 DIAGNOSIS — N950 Postmenopausal bleeding: Secondary | ICD-10-CM

## 2019-08-20 NOTE — Telephone Encounter (Signed)
Patient called requesting to speak with her nurse. Patient states she has a question for her. Please f/u

## 2019-08-21 NOTE — Telephone Encounter (Signed)
Contacted pt to go over Dr. Johnson response pt is aware and doesn't have any questions or concerns  

## 2019-08-21 NOTE — Telephone Encounter (Signed)
Returned pt call. Pt states she started bleeding again but it stopped. Pt states she has an appointment on the 5th with GYN.

## 2019-09-09 ENCOUNTER — Ambulatory Visit (HOSPITAL_COMMUNITY): Payer: Medicare Other

## 2019-09-11 ENCOUNTER — Other Ambulatory Visit: Payer: Self-pay | Admitting: Internal Medicine

## 2019-09-11 DIAGNOSIS — Z720 Tobacco use: Secondary | ICD-10-CM

## 2019-09-11 DIAGNOSIS — I10 Essential (primary) hypertension: Secondary | ICD-10-CM

## 2019-09-16 ENCOUNTER — Encounter: Payer: Medicare Other | Admitting: Family Medicine

## 2019-09-16 ENCOUNTER — Encounter: Payer: Self-pay | Admitting: Family Medicine

## 2019-09-17 ENCOUNTER — Ambulatory Visit: Payer: Medicare Other | Admitting: Internal Medicine

## 2019-09-19 ENCOUNTER — Encounter: Payer: Self-pay | Admitting: Internal Medicine

## 2019-09-19 ENCOUNTER — Ambulatory Visit: Payer: Medicare Other | Attending: Internal Medicine | Admitting: Internal Medicine

## 2019-09-19 ENCOUNTER — Other Ambulatory Visit: Payer: Self-pay

## 2019-09-19 DIAGNOSIS — G35 Multiple sclerosis: Secondary | ICD-10-CM

## 2019-09-19 DIAGNOSIS — E1159 Type 2 diabetes mellitus with other circulatory complications: Secondary | ICD-10-CM | POA: Diagnosis not present

## 2019-09-19 DIAGNOSIS — N950 Postmenopausal bleeding: Secondary | ICD-10-CM

## 2019-09-19 DIAGNOSIS — Z6841 Body Mass Index (BMI) 40.0 and over, adult: Secondary | ICD-10-CM

## 2019-09-19 NOTE — Progress Notes (Signed)
Pt states her legs are getting weaker  Pt states they Tylenol is not helping with the pain
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
## 2019-09-24 ENCOUNTER — Ambulatory Visit: Payer: Medicare Other | Admitting: Pharmacist

## 2019-10-07 ENCOUNTER — Telehealth: Payer: Medicare Other | Admitting: Neurology

## 2019-10-08 ENCOUNTER — Other Ambulatory Visit: Payer: Self-pay | Admitting: Internal Medicine

## 2019-10-08 ENCOUNTER — Ambulatory Visit: Payer: Medicare Other | Admitting: Interventional Cardiology

## 2019-10-08 DIAGNOSIS — I10 Essential (primary) hypertension: Secondary | ICD-10-CM

## 2019-10-08 DIAGNOSIS — Z720 Tobacco use: Secondary | ICD-10-CM

## 2019-10-08 MED ORDER — VARENICLINE TARTRATE 1 MG PO TABS: 1.0000 mg | ORAL_TABLET | Freq: Two times a day (BID) | ORAL | 2 refills | Status: DC

## 2019-10-08 NOTE — Progress Notes (Signed)
CARDIOLOGY OFFICE NOTE  Date:  10/09/2019    Tiffany Brady Date of Birth: 19-Apr-1961 Medical Record #[id number]  PCP:  Tiffany Pier, MD  Cardiologist:  Tiffany Brady   Chief Complaint  Patient presents with  . Follow-up    Seen for Dr. Tamala Brady    History of Present Illness: Tiffany Brady is a 59 y.o. female who presents today for a follow up visit. Seen for Dr. Tamala Brady.   She has a history of CAD with prior NSTEMI in 08/2018 with cardiac cath, pre DM, HLD, ongoing tobacco use, morbid obesity, HTN, gout, chronic pain syndrome and MS.   Last seen in June of 2020 by Dr. Tamala Brady.   The patient does not have symptoms concerning for COVID-19 infection (fever, chills, cough, or new shortness of breath).   Comes in today. Here alone. Was suppose to have been seen yesterday but was late due to the bus. She says this is her follow up - and having some issues. She notes the onset of upper back pain and pain in her left breast yesterday - coming and going - very sharp - totally different from her discomfort that she had with her MI in 2019. Diffuse tenderness. No medicines today. BP is up. Still smoking - tried to smoke less yesterday due to the discomfort. No recent labs other than an A1C that was done by a home health visit. No BP cuff at home. Says she is taking her medicines - except for her MS meds. Needs to see Neurology. She feels weaker in general. Says tylenol does not work for her. Got way off track with the holidays and has gained weight.   Past Medical History:  Diagnosis Date  . Chronic kidney disease    pt reported kidney failure  . Chronic pain syndrome   . Gout   . Hypertension   . MS (multiple sclerosis) ([place])   . Tobacco use     Past Surgical History:  Procedure Laterality Date  . FOOT SURGERY Right   . LEFT HEART CATH AND CORONARY ANGIOGRAPHY N/A 08/22/2018   Procedure: LEFT HEART CATH AND CORONARY ANGIOGRAPHY;  Surgeon: Sherren Mocha, MD;  Location: [facility];  Service: Cardiovascular;  Laterality: N/A;     Medications: Current Meds  Medication Sig  . Accu-Chek Softclix Lancets lancets AS DIRECTED DAILY  . amLODipine (NORVASC) 5 MG tablet TAKE ONE TABLET BY MOUTH DAILY  . aspirin EC 81 MG tablet Take 1 tablet (81 mg total) by mouth daily.  Marland Kitchen atorvastatin (LIPITOR) 80 MG tablet TAKE 1 TABLET (80 MG TOTAL) BY MOUTH DAILY AT 6 PM.  . Blood Glucose Monitoring Suppl (TRUE METRIX GO GLUCOSE METER) w/Device KIT 1 each by Does not apply route every 8 (eight) hours as needed.  . cholecalciferol (VITAMIN D3) 25 MCG (1000 UT) tablet Take 5,000 Units by mouth daily.  . clopidogrel (PLAVIX) 75 MG tablet TAKE ONE TABLET BY MOUTH DAILY  . cyclobenzaprine (FLEXERIL) 10 MG tablet Take 1 tablet (10 mg total) by mouth 2 (two) times daily as needed for muscle spasms.  . diclofenac sodium (VOLTAREN) 1 % GEL APPLY TWO GRAMS TOPICALLY FOUR TIMES A DAY  . Dimethyl Fumarate (TECFIDERA) 240 MG CPDR Take 1 capsule (240 mg total) by mouth 2 (two) times daily.  . DULoxetine (CYMBALTA) 30 MG capsule Take 1 capsule (30 mg total) by mouth daily. Must have office visit for refills  . ezetimibe (ZETIA) 10 MG tablet Take 1 tablet (  10 mg total) by mouth daily.  . fluticasone (FLONASE) 50 MCG/ACT nasal spray PLACE ONE SPRAY INTO BOTH NOSTRILS DAILY AS NEEDED FOR ALLERGIES OR RHINITIS  . glucose blood (TRUE METRIX BLOOD GLUCOSE TEST) test strip Check blood sugar once a day  . losartan (COZAAR) 25 MG tablet Take 1 tablet (25 mg total) by mouth daily. Must have office visit for refills  . nitroGLYCERIN (NITROSTAT) 0.4 MG SL tablet Place 1 tablet (0.4 mg total) under the tongue every 5 (five) minutes x 3 doses as needed for chest pain.  . pregabalin (LYRICA) 25 MG capsule Take 1 capsule (25 mg total) by mouth 2 (two) times daily.  . varenicline (CHANTIX CONTINUING MONTH PAK) 1 MG tablet Take 1 tablet (1 mg total) by mouth 2 (two) times daily.  . [DISCONTINUED] metoprolol tartrate  (LOPRESSOR) 25 MG tablet TAKE ONE TABLET BY MOUTH TWICE A DAY     Allergies: Allergies  Allergen Reactions  . Pork-Derived Metallurgist    Social History: The patient  reports that she has been smoking cigarettes. She has a 30.00 pack-year smoking history. She has never used smokeless tobacco. She reports current alcohol use. She reports that she does not use drugs.   Family History: The patient's family history includes Cancer in her mother; HIV/AIDS in her brother; Hypertension in her mother; Multiple sclerosis in her daughter; Other in her father.   Review of Systems: Please see the history of present illness.   All other systems are reviewed and negative.   Physical Exam: VS:  BP (!) 152/100   Pulse 90   Ht 5' 4"  (1.626 m)   Wt 270 lb (122.5 kg)   LMP 04/21/2014   SpO2 97%   BMI 46.35 kg/m  .  BMI Body mass index is 46.35 kg/m.  Wt Readings from Last 3 Encounters:  10/09/19 270 lb (122.5 kg)  03/29/19 263 lb 3.2 oz (119.4 kg)  02/22/19 262 lb (118.8 kg)   BP is 160/100 by me.   General: Obese. Alert and in no acute distress. She has gained weight.  HEENT: Normal.  Neck: Supple, no JVD, carotid bruits, or masses noted.  Cardiac: Regular rate and rhythm. No murmurs, rubs, or gallops. No edema. She has diffuse tenderness over the upper back and chest and winces with palpation.  Respiratory:  Lungs are clear to auscultation bilaterally with normal work of breathing.  GI: Soft and nontender.  MS: No deformity or atrophy. Gait and ROM intact. Using a cane.  Skin: Warm and dry. Color is normal.  Neuro:  Strength and sensation are intact and no gross focal deficits noted.  Psych: Alert, appropriate and with normal affect.   LABORATORY DATA:  EKG:  EKG is ordered today. This demonstrates NSR with RAE - HR is 90. Unchanged.   Lab Results  Component Value Date   WBC 7.2 08/23/2018   HGB 14.0 08/23/2018   HCT 42.8 08/23/2018   PLT 198 08/23/2018   GLUCOSE 129 (H)  12/27/2018   CHOL 107 02/28/2019   TRIG 55 02/28/2019   HDL 49 02/28/2019   LDLCALC 47 02/28/2019   ALT 14 02/28/2019   AST 15 02/28/2019   NA 142 12/27/2018   K 3.9 12/27/2018   CL 104 12/27/2018   CREATININE 1.41 (H) 12/27/2018   BUN 11 12/27/2018   CO2 21 12/27/2018   TSH 1.673 08/21/2018   INR 1.46 08/22/2018   HGBA1C 6.7 03/29/2019   MICROALBUR 0.3 02/01/2016  BNP (last 3 results) No results for input(s): BNP in the last 8760 hours.  ProBNP (last 3 results) No results for input(s): PROBNP in the last 8760 hours.   Other Studies Reviewed Today:  LEFT HEART CATH AND CORONARY ANGIOGRAPHY 08/2018  Conclusion    Ost Ramus to Ramus lesion is 90% stenosed.  A drug-eluting stent was successfully placed using a STENT SYNERGY DES 2.75X16.  Post intervention, there is a 0% residual stenosis.   1. 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 Mild nonobstructive dz of the LAD, circumflex, and RCA 3. Normal LV function by echo    Echo Study Conclusions 08/2018  - Left ventricle: The cavity size was normal. Systolic function was   normal. The estimated ejection fraction was in the range of 60%   to 65%. Wall motion was normal; there were no regional wall   motion abnormalities. Doppler parameters are consistent with   abnormal left ventricular relaxation (grade 1 diastolic   dysfunction).  Impressions:  - Compared to the prior study, there has been no significant   interval change.    ASSESSMENT & PLAN:    1. Chest pain - very atypical - nothing like prior chest pain syndrome and has diffuse tenderness with palpation on exam - EKG is fine. She is reassured. Would favor using Tylenol but she says this does not work for her.   2. Known CAD - remains on DAPT - needs labs - needs aggressive CV risk factor modification which is challenging at best.   3. HTN - BP not controlled  - I am not totally convinced she is taking her medicines as prescribed - increasing Metoprolol today to 50 mg BID.   4. Ongoing tobacco abuse - total cessation encouraged.   5. HLD - on statin - no recent labs  6. Morbid obesity  7. MS - per neurology - she was given appointment for later this month.  8. Chronic pain syndrome - per PCP  9. COVID-19 Education: The signs and symptoms of COVID-19 were discussed with the patient and how to seek care for testing (follow up with PCP or arrange E-visit).  The importance of social distancing, staying at home, hand hygiene and wearing a mask when out in public were discussed today.  Current medicines are reviewed with the patient today.  The patient does not have concerns regarding medicines other than what has been noted above.  The following changes have been made:  See above.  Labs/ tests ordered today include:    Orders Placed This Encounter  Procedures  . Basic metabolic panel  . CBC  . Hepatic function panel  . Lipid panel  . EKG 12-Lead     Disposition:   FU with [name] in about a month.    Patient is agreeable to this plan and will call if any problems develop in the interim.   SignedTruitt Merle, NP  10/09/2019 11:27 AM  [facility] [address] [place], [place]  [address] Phone: [phone] Fax: [phone]

## 2019-10-09 ENCOUNTER — Encounter: Payer: Self-pay | Admitting: Neurology

## 2019-10-09 ENCOUNTER — Other Ambulatory Visit: Payer: Self-pay

## 2019-10-09 ENCOUNTER — Ambulatory Visit (INDEPENDENT_AMBULATORY_CARE_PROVIDER_SITE_OTHER): Payer: Medicare Other | Admitting: Nurse Practitioner

## 2019-10-09 ENCOUNTER — Encounter: Payer: Self-pay | Admitting: Nurse Practitioner

## 2019-10-09 VITALS — BP 152/100 | HR 90 | Ht 64.0 in | Wt 270.0 lb

## 2019-10-09 DIAGNOSIS — E781 Pure hyperglyceridemia: Secondary | ICD-10-CM

## 2019-10-09 DIAGNOSIS — I214 Non-ST elevation (NSTEMI) myocardial infarction: Secondary | ICD-10-CM

## 2019-10-09 DIAGNOSIS — R079 Chest pain, unspecified: Secondary | ICD-10-CM | POA: Diagnosis not present

## 2019-10-09 DIAGNOSIS — I2510 Atherosclerotic heart disease of native coronary artery without angina pectoris: Secondary | ICD-10-CM | POA: Diagnosis not present

## 2019-10-09 DIAGNOSIS — Z79899 Other long term (current) drug therapy: Secondary | ICD-10-CM

## 2019-10-09 DIAGNOSIS — Z720 Tobacco use: Secondary | ICD-10-CM | POA: Diagnosis not present

## 2019-10-09 DIAGNOSIS — Z7189 Other specified counseling: Secondary | ICD-10-CM

## 2019-10-09 DIAGNOSIS — I10 Essential (primary) hypertension: Secondary | ICD-10-CM

## 2019-10-09 LAB — CBC
Hematocrit: 48.4 % — ABNORMAL HIGH (ref 34.0–46.6)
Hemoglobin: 16.2 g/dL — ABNORMAL HIGH (ref 11.1–15.9)
MCH: 29.7 pg (ref 26.6–33.0)
MCHC: 33.5 g/dL (ref 31.5–35.7)
MCV: 89 fL (ref 79–97)
Platelets: 242 10*3/uL (ref 150–450)
RBC: 5.45 x10E6/uL — ABNORMAL HIGH (ref 3.77–5.28)
RDW: 12.3 % (ref 11.7–15.4)
WBC: 7.8 10*3/uL (ref 3.4–10.8)

## 2019-10-09 LAB — HEPATIC FUNCTION PANEL
ALT: 12 IU/L (ref 0–32)
AST: 12 IU/L (ref 0–40)
Albumin: 4.1 g/dL (ref 3.8–4.9)
Alkaline Phosphatase: 104 IU/L (ref 39–117)
Bilirubin Total: 0.3 mg/dL (ref 0.0–1.2)
Bilirubin, Direct: 0.14 mg/dL (ref 0.00–0.40)
Total Protein: 7.6 g/dL (ref 6.0–8.5)

## 2019-10-09 LAB — LIPID PANEL
Chol/HDL Ratio: 2.3 ratio (ref 0.0–4.4)
Cholesterol, Total: 153 mg/dL (ref 100–199)
HDL: 66 mg/dL (ref >39)
LDL Chol Calc (NIH): 75 mg/dL (ref 0–99)
Triglycerides: 57 mg/dL (ref 0–149)
VLDL Cholesterol Cal: 12 mg/dL (ref 5–40)

## 2019-10-09 LAB — BASIC METABOLIC PANEL
BUN/Creatinine Ratio: 7 — ABNORMAL LOW (ref 9–23)
BUN: 10 mg/dL (ref 6–24)
CO2: 24 mmol/L (ref 20–29)
Calcium: 9.6 mg/dL (ref 8.7–10.2)
Chloride: 100 mmol/L (ref 96–106)
Creatinine, Ser: 1.47 mg/dL — ABNORMAL HIGH (ref 0.57–1.00)
GFR calc Af Amer: 45 mL/min/{1.73_m2} — ABNORMAL LOW (ref >59)
GFR calc non Af Amer: 39 mL/min/{1.73_m2} — ABNORMAL LOW (ref >59)
Glucose: 108 mg/dL — ABNORMAL HIGH (ref 65–99)
Potassium: 4.2 mmol/L (ref 3.5–5.2)
Sodium: 139 mmol/L (ref 134–144)

## 2019-10-09 MED ORDER — METOPROLOL TARTRATE 50 MG PO TABS: 50.0000 mg | ORAL_TABLET | Freq: Two times a day (BID) | ORAL | 3 refills | Status: DC

## 2019-10-09 NOTE — Patient Instructions (Addendum)
After Visit Summary:  We will be checking the following labs today - BMET, CBC, HPF and lipids   Medication Instructions:    Continue with your current medicines. BUT  I am going to increase the Metoprolol to 50 mg twice a day - this is at your pharmacy.    If you need a refill on your cardiac medications before your next appointment, please call your pharmacy.     Testing/Procedures To Be Arranged:  N/A  Follow-Up:   See [name] in 4 to 6 weeks.     At [facility], you and your health needs are our priority.  As part of our continuing mission to provide you with exceptional heart care, we have created designated Provider Care Teams.  These Care Teams include your primary Cardiologist (physician) and Advanced Practice Providers (APPs -  Physician Assistants and Nurse Practitioners) who all work together to provide you with the care you need, when you need it.  Special Instructions:  . Stay safe, stay home, wash your hands for at least 20 seconds and wear a mask when out in public.  . It was good to talk with you today.  . Try to keep working on stopping smoking.    Call the [place] office at [phone] if you have any questions, problems or concerns.

## 2019-10-11 ENCOUNTER — Other Ambulatory Visit: Payer: Self-pay

## 2019-10-11 ENCOUNTER — Telehealth: Payer: Self-pay | Admitting: *Deleted

## 2019-10-11 ENCOUNTER — Telehealth (INDEPENDENT_AMBULATORY_CARE_PROVIDER_SITE_OTHER): Payer: Medicare Other | Admitting: Neurology

## 2019-10-11 VITALS — Ht 64.0 in | Wt 270.0 lb

## 2019-10-11 DIAGNOSIS — G35 Multiple sclerosis: Secondary | ICD-10-CM | POA: Diagnosis not present

## 2019-10-11 MED ORDER — AMBULATORY NON FORMULARY MEDICATION: 0 refills | Status: DC

## 2019-10-11 NOTE — Progress Notes (Signed)
Addendum Prescription for walker will be mailed, as per patient request.

## 2019-10-11 NOTE — Telephone Encounter (Signed)
Faxed START FORM--Tecfidera to  [phone]

## 2019-10-11 NOTE — Progress Notes (Signed)
Virtual Visit via Video Note The purpose of this virtual visit is to provide medical care while limiting exposure to the novel coronavirus.    Consent was obtained for video visit:  Yes.   Answered questions that patient had about telehealth interaction:  Yes.   I discussed the limitations, risks, security and privacy concerns of performing an evaluation and management service by telemedicine. I also discussed with the patient that there may be a patient responsible charge related to this service. The patient expressed understanding and agreed to proceed.  Pt location: Home Physician Location: office Name of referring provider:  Ladell Pier, MD I connected with Tiffany Brady at patients initiation/request on 10/11/2019 at 10:30 AM EST by video enabled telemedicine application and verified that I am speaking with the correct person using two identifiers. Pt MRN:  [id number] Pt DOB:  April 14, 1961 Video Participants:  Tiffany Brady   History of Present Illness: This is a 59 y.o. female returning for follow-up of relapsing-remitting multiple sclerosis.  Since her last visit in April 2020, she has been fair.  She suffered two falls, breaking two commodes, and says that her legs simply buckled.  With her second fall, she had hip and knee pain, but no preceding pain with her first fall.  She continues to walk with a cane. She was taking Tecfidera regularly, but tells me that she has been out of her medication for the past week, since it was not mailed.    She underwent surveillance imaging in June which was relatively stable with minimal disease progression.  There was a note of microhemorrhage, likely hypertensive.  Her blood pressure has been elevated and Cozaar was increased to 50mg  daily.    Observations/Objective:   Vitals:   10/09/19 1120  Weight: 270 lb (122.5 kg)  Height: 5\' 4"  (1.626 m)   Patient is awake, alert, and appears comfortable.  Oriented x 4.   Extraocular muscles  are intact. No ptosis.  Face is symmetric.  Speech is not dysarthric.  Antigravity in all extremities.    DATA: MRI brain wwo contrast 02/18/2019: 1. Stable to at most minimal interval progression of cerebral white matter disease in this patient with a clinical diagnosis of multiple sclerosis. No evidence of active demyelination. 2. Chronic microhemorrhages suggesting hypertension with a new microhemorrhage in the right corona radiata.    Assessment and Plan:  1.  Relapsing remitting multiple sclerosis, diagnosed 2016.  She has been on DMT since 2017 with Tecfidera and is tolerating it well.  MRI brain from June 2020 shows minimal evidence of disease progression. Unfortunately, now that Tecfidera is generic, she has not received shipment of medication and has been out for the past week.  I will switch her to generic dimethyl fumerate 240mg  BID in hopes this will quickly get approved and she can resume therapy. CBC and LFTs from earlier this month reviewed and normal.   2.  Falls, unclear etiology.  She has completed PT and uses a cane as needed.  She will be seeing her PCP for hip and knee pain to see if any of her falls could be stemming from MSK pathology.   Follow Up Instructions:   I discussed the assessment and treatment plan with the patient. The patient was provided an opportunity to ask questions and all were answered. The patient agreed with the plan and demonstrated an understanding of the instructions.   The patient was advised to call back or seek an in-person evaluation if the  symptoms worsen or if the condition fails to improve as anticipated.  Follow-up in 6 months   [name], DO

## 2019-10-11 NOTE — Addendum Note (Signed)
Addended by: [name] on: 10/11/2019 05:40 PM   Modules accepted: Orders

## 2019-10-14 ENCOUNTER — Other Ambulatory Visit: Payer: Self-pay

## 2019-10-15 ENCOUNTER — Ambulatory Visit: Payer: Self-pay | Admitting: Obstetrics and Gynecology

## 2019-10-18 ENCOUNTER — Telehealth: Payer: Medicare Other | Admitting: Neurology

## 2019-10-18 ENCOUNTER — Telehealth: Payer: Self-pay | Admitting: Neurology

## 2019-10-18 NOTE — Telephone Encounter (Signed)
Patient is calling in about a denial for medication- She has no other information she said. Couldn't remember name of medication or place.Marland KitchenMarland KitchenThanks!

## 2019-10-18 NOTE — Telephone Encounter (Signed)
Did you see anything on this for denial?

## 2019-11-01 ENCOUNTER — Other Ambulatory Visit: Payer: Self-pay | Admitting: Internal Medicine

## 2019-11-01 ENCOUNTER — Ambulatory Visit: Payer: Self-pay | Admitting: Obstetrics and Gynecology

## 2019-11-01 DIAGNOSIS — G35 Multiple sclerosis: Secondary | ICD-10-CM

## 2019-11-04 ENCOUNTER — Other Ambulatory Visit: Payer: Self-pay

## 2019-11-05 ENCOUNTER — Ambulatory Visit (INDEPENDENT_AMBULATORY_CARE_PROVIDER_SITE_OTHER): Payer: Medicare Other | Admitting: Obstetrics and Gynecology

## 2019-11-05 ENCOUNTER — Encounter: Payer: Self-pay | Admitting: Obstetrics and Gynecology

## 2019-11-05 VITALS — BP 144/86 | Ht 65.0 in | Wt 271.0 lb

## 2019-11-05 DIAGNOSIS — Z113 Encounter for screening for infections with a predominantly sexual mode of transmission: Secondary | ICD-10-CM | POA: Diagnosis not present

## 2019-11-05 DIAGNOSIS — N898 Other specified noninflammatory disorders of vagina: Secondary | ICD-10-CM | POA: Diagnosis not present

## 2019-11-05 DIAGNOSIS — Z23 Encounter for immunization: Secondary | ICD-10-CM | POA: Diagnosis not present

## 2019-11-05 DIAGNOSIS — N950 Postmenopausal bleeding: Secondary | ICD-10-CM

## 2019-11-05 DIAGNOSIS — Z01411 Encounter for gynecological examination (general) (routine) with abnormal findings: Secondary | ICD-10-CM | POA: Diagnosis not present

## 2019-11-05 DIAGNOSIS — Z9189 Other specified personal risk factors, not elsewhere classified: Secondary | ICD-10-CM

## 2019-11-05 LAB — WET PREP FOR TRICH, YEAST, CLUE

## 2019-11-05 MED ORDER — FLUCONAZOLE 150 MG PO TABS: 150.0000 mg | ORAL_TABLET | ORAL | 0 refills | Status: AC

## 2019-11-05 NOTE — Patient Instructions (Addendum)
Please schedule a bone density/DEXA scan at your convenience to evaluate your bone density. You do have a vaginal yeast infection, so I prescribed you Diflucan tablet to take by mouth 3 days apart for 2 doses. I would like you to schedule an ultrasound of the uterus at your convenience in the coming weeks due to the vaginal bleeding that you have had.

## 2019-11-05 NOTE — Progress Notes (Signed)
Tiffany Brady 07-Feb-1961 [id number]   SUBJECTIVE:  59 y.o. G23P0011 female for annual routine gynecologic exam and Pap smear.  New to our office. Sexually active with one partner of 4 years.  She has been having vaginal odor in past 3 weeks.  Questioning if her partner has had any other partners.  Some spotting just yesterday. She had about 3 months that she had a 5 day period last year.  Before that she said her last period was 2 years prior to that.  Notes a history of irregular periods, sometimes would go almost up to a year without a period.  First period was age 12 or 8.    Current Outpatient Medications  Medication Sig Dispense Refill  . Accu-Chek Softclix Lancets lancets AS DIRECTED DAILY 100 each 11  . AMBULATORY NON FORMULARY MEDICATION 4-wheeled rollator 1 each 0  . amLODipine (NORVASC) 5 MG tablet TAKE ONE TABLET BY MOUTH DAILY 90 tablet 0  . aspirin EC 81 MG tablet Take 1 tablet (81 mg total) by mouth daily. 100 tablet 1  . atorvastatin (LIPITOR) 80 MG tablet TAKE 1 TABLET (80 MG TOTAL) BY MOUTH DAILY AT 6 PM. 30 tablet 9  . Blood Glucose Monitoring Suppl (TRUE METRIX GO GLUCOSE METER) w/Device KIT 1 each by Does not apply route every 8 (eight) hours as needed. 1 kit 0  . cholecalciferol (VITAMIN D3) 25 MCG (1000 UT) tablet Take 5,000 Units by mouth daily.    . clopidogrel (PLAVIX) 75 MG tablet TAKE ONE TABLET BY MOUTH DAILY 30 tablet 6  . cyclobenzaprine (FLEXERIL) 10 MG tablet TAKE 1 TABLET (10 MG TOTAL) BY MOUTH TWO (TWO) TIMES DAILY AS NEEDED FOR MUSCLE SPASMS. 60 tablet 2  . Dimethyl Fumarate (TECFIDERA) 240 MG CPDR Take 1 capsule (240 mg total) by mouth 2 (two) times daily. 180 capsule 3  . DULoxetine (CYMBALTA) 30 MG capsule Take 1 capsule (30 mg total) by mouth daily. Must have office visit for refills 30 capsule 0  . ezetimibe (ZETIA) 10 MG tablet Take 1 tablet (10 mg total) by mouth daily. 30 tablet 11  . fluticasone (FLONASE) 50 MCG/ACT nasal spray PLACE ONE SPRAY INTO  BOTH NOSTRILS DAILY AS NEEDED FOR ALLERGIES OR RHINITIS 16 g 0  . glucose blood (TRUE METRIX BLOOD GLUCOSE TEST) test strip Check blood sugar once a day 100 each 12  . losartan (COZAAR) 25 MG tablet Take 1 tablet (25 mg total) by mouth daily. Must have office visit for refills 30 tablet 0  . metoprolol tartrate (LOPRESSOR) 50 MG tablet Take 1 tablet (50 mg total) by mouth 2 (two) times daily. 180 tablet 3  . nitroGLYCERIN (NITROSTAT) 0.4 MG SL tablet Place 1 tablet (0.4 mg total) under the tongue every 5 (five) minutes x 3 doses as needed for chest pain. 25 tablet 12  . pregabalin (LYRICA) 25 MG capsule Take 1 capsule (25 mg total) by mouth 2 (two) times daily. 60 capsule 2  . diclofenac sodium (VOLTAREN) 1 % GEL APPLY TWO GRAMS TOPICALLY FOUR TIMES A DAY (Patient not taking: Reported on 11/05/2019) 100 g 0  . varenicline (CHANTIX CONTINUING MONTH PAK) 1 MG tablet Take 1 tablet (1 mg total) by mouth 2 (two) times daily. (Patient not taking: Reported on 11/05/2019) 56 tablet 2   No current facility-administered medications for this visit.   Allergies: Pork-derived products  Patient's last menstrual period was 04/21/2014.  Past medical history,surgical history, problem list, medications, allergies, family history and social history  were all reviewed and documented as reviewed in the EPIC chart.  ROS:  Feeling well. No dyspnea or chest pain on exertion.  No abdominal pain, change in bowel habits, black or bloody stools.  No urinary tract symptoms. GYN ROS: + postmenopausal bleeding, +discharge and odor, no pelvic pain, no breast pain or new or enlarging lumps on self exam. No neurological complaints.    OBJECTIVE:  BP (!) 144/86 (Cuff Size: Large)   Ht _0  (1.651 m)   Wt 271 lb (122.9 kg)   LMP 04/21/2014   BMI 45.10 kg/m  The patient appears well, alert, oriented x 3, in no distress. ENT normal.  Neck supple. No cervical or supraclavicular adenopathy or thyromegaly.  Lungs are clear, good  air entry, no wheezes, rhonchi or rales. S1 and S2 normal, no murmurs, regular rate and rhythm.  Abdomen soft without tenderness, guarding, mass or organomegaly.  Neurological is normal, no focal findings.  BREAST EXAM: breasts appear normal, no suspicious masses, soft glandular nodule at 10:00 at right areola border which is soft and mobile, no skin or nipple changes or axillary nodes  PELVIC EXAM: VULVA: normal appearing vulva with no masses, tenderness or lesions, atrophic changes noted, VAGINA: normal appearing vagina with normal color and discharge, no lesions, scant dark blood mixed mucus, CERVIX: normal appearing cervix without discharge or lesions, no active bleeding, UTERUS: Difficult to palpate due to body habitus, ADNEXA: Difficult to palpate due to body habitus, nontender, PAP: Pap smear done today, thin-prep method, WET MOUNT done - results: +hyphae, no clue cells, no trichomonas  Chaperone: Caryn Bee present during the examination  ASSESSMENT:  59 y.o. [id number] here for annual gynecologic exam  PLAN:   1.  Postmenopausal.  Her only concern is the spotting that she has had, and it is potentially abnormal that she had return of a period last year.  She does have risk factors for endometrial hyperplasia including the elevated BMI.  I would like her to return for a transvaginal ultrasound and possible endometrial biopsy at her convenience in the coming weeks. 2. Pap smear 2018.  Pap smear is collected today.  No significant history of abnormal Pap smears.   3. Mammogram 07/2019.  Normal breast exam today.  Small glandular nodule noted.  She is reminded to schedule an annual mammogram this year when due. 4.  Vaginal discharge and odor.  Scant blood noted, no abnormal discharge or odor noted on exam today.  We will also check GC/chlamydia off the ThinPrep.  Vaginal wet prep is positive for yeast hyphae, prescription given for Diflucan. 5. Colonoscopy 2016.  Recommended that she follow up  at the recommended interval.  6.  Baseline DEXA indicated in postmenopausal years.  I will have the patient schedule this when she leaves today. 7. Health maintenance.  No labs today as she normally has these completed with her primary care provider.  Pressure slightly high today so I recommend that she check on this periodically at home and let her primary care provider know of any distantly elevated values.  Return annually or sooner, prn.  Joseph Pierini MD  11/05/19

## 2019-11-06 LAB — PAP THINPREP ASCUS RFLX HPV RFLX TYPE
C. trachomatis RNA, TMA: NOT DETECTED
N. gonorrhoeae RNA, TMA: NOT DETECTED

## 2019-11-15 NOTE — Progress Notes (Signed)
CARDIOLOGY OFFICE NOTE  Date:  11/22/2019    Tiffany Brady Date of Birth: 1960-11-10 Medical Record #[id number]  PCP:  Ladell Pier, MD  Cardiologist:  Jennings Books  Chief Complaint  Patient presents with  . Follow-up    Seen for Dr. Tamala Julian    History of Present Illness: Tiffany Brady is a 59 y.o. female who presents today for a 6 week check. Seen for Dr. Tamala Julian.   She has a history of CAD with prior NSTEMI in 08/2018 with cardiac cath, pre DM, HLD, ongoing tobacco use, morbid obesity, HTN, gout, chronic pain syndrome and MS.   Last seen in June of 2020 by Dr. Tamala Julian. I saw her back in January - several issues noted that included atypical chest pain pain, still smoking, feeling weak, etc. BP was up - I was not convinced she was taking her medicines as prescribed. Metoprolol was increased.   The patient does not have symptoms concerning for COVID-19 infection (fever, chills, cough, or new shortness of breath).   Comes in today. Here alone. Said she is doing ok - but had a spell yesterday - had to go lie down - noted her heart was hurting - felt like she was "having a baby" - laid down and it went away after a few minutes. Says she is "getting better" with her smoking but does not quantify. BP better. HR is better. Says she is taking her medicines.   Past Medical History:  Diagnosis Date  . Chronic kidney disease    pt reported kidney failure  . Chronic pain syndrome   . Gout   . Hypertension   . MS (multiple sclerosis) ([place])   . Tobacco use     Past Surgical History:  Procedure Laterality Date  . FOOT SURGERY Right   . LEFT HEART CATH AND CORONARY ANGIOGRAPHY N/A 08/22/2018   Procedure: LEFT HEART CATH AND CORONARY ANGIOGRAPHY;  Surgeon: Sherren Mocha, MD;  Location: [facility];  Service: Cardiovascular;  Laterality: N/A;     Medications: Current Meds  Medication Sig  . Accu-Chek Softclix Lancets lancets AS DIRECTED DAILY  . AMBULATORY  NON FORMULARY MEDICATION 4-wheeled rollator  . amLODipine (NORVASC) 5 MG tablet TAKE ONE TABLET BY MOUTH DAILY  . aspirin EC 81 MG tablet Take 1 tablet (81 mg total) by mouth daily.  Marland Kitchen atorvastatin (LIPITOR) 80 MG tablet TAKE 1 TABLET (80 MG TOTAL) BY MOUTH DAILY AT 6 PM.  . Blood Glucose Monitoring Suppl (TRUE METRIX GO GLUCOSE METER) w/Device KIT 1 each by Does not apply route every 8 (eight) hours as needed.  . cholecalciferol (VITAMIN D3) 25 MCG (1000 UT) tablet Take 5,000 Units by mouth daily.  . clopidogrel (PLAVIX) 75 MG tablet TAKE ONE TABLET BY MOUTH DAILY  . cyclobenzaprine (FLEXERIL) 10 MG tablet TAKE 1 TABLET (10 MG TOTAL) BY MOUTH TWO (TWO) TIMES DAILY AS NEEDED FOR MUSCLE SPASMS.  Marland Kitchen diclofenac sodium (VOLTAREN) 1 % GEL APPLY TWO GRAMS TOPICALLY FOUR TIMES A DAY  . Dimethyl Fumarate (TECFIDERA) 240 MG CPDR Take 1 capsule (240 mg total) by mouth 2 (two) times daily.  . DULoxetine (CYMBALTA) 30 MG capsule Take 1 capsule (30 mg total) by mouth daily. Must have office visit for refills  . ezetimibe (ZETIA) 10 MG tablet Take 1 tablet (10 mg total) by mouth daily.  . fluticasone (FLONASE) 50 MCG/ACT nasal spray PLACE ONE SPRAY INTO BOTH NOSTRILS DAILY AS NEEDED FOR ALLERGIES OR RHINITIS  .  glucose blood (TRUE METRIX BLOOD GLUCOSE TEST) test strip Check blood sugar once a day  . losartan (COZAAR) 25 MG tablet Take 1 tablet (25 mg total) by mouth daily. Must have office visit for refills  . metoprolol tartrate (LOPRESSOR) 50 MG tablet Take 1 tablet (50 mg total) by mouth 2 (two) times daily.  . nitroGLYCERIN (NITROSTAT) 0.4 MG SL tablet Place 1 tablet (0.4 mg total) under the tongue every 5 (five) minutes x 3 doses as needed for chest pain.  . pregabalin (LYRICA) 25 MG capsule Take 1 capsule (25 mg total) by mouth 2 (two) times daily.  . varenicline (CHANTIX CONTINUING MONTH PAK) 1 MG tablet Take 1 tablet (1 mg total) by mouth 2 (two) times daily.     Allergies: Allergies  Allergen  Reactions  . Pork-Derived Metallurgist    Social History: The patient  reports that she has been smoking cigarettes. She has a 30.00 pack-year smoking history. She has never used smokeless tobacco. She reports current alcohol use. She reports that she does not use drugs.   Family History: The patient's family history includes Cancer in her mother; HIV/AIDS in her brother; Hypertension in her mother; Multiple sclerosis in her daughter; Other in her father.   Review of Systems: Please see the history of present illness.   All other systems are reviewed and negative.   Physical Exam: VS:  BP 128/78   Pulse 76   Ht 5' 4" (1.626 m)   Wt 269 lb 12.8 oz (122.4 kg)   LMP 04/21/2014   SpO2 93%   BMI 46.31 kg/m  .  BMI Body mass index is 46.31 kg/m.  Wt Readings from Last 3 Encounters:  11/22/19 269 lb 12.8 oz (122.4 kg)  11/05/19 271 lb (122.9 kg)  10/09/19 270 lb (122.5 kg)    General: Alert. She is in no acute distress. Remains obese.   HEENT: Normal.  Neck: Supple, no JVD, carotid bruits, or masses noted.  Cardiac: Regular rate and rhythm. No murmurs, rubs, or gallops. No edema.  Respiratory:  Lungs are clear to auscultation bilaterally with normal work of breathing.  GI: Obese.   MS: No deformity or atrophy. Gait and ROM intact.  Skin: Warm and dry. Color is normal.  Neuro:  Strength and sensation are intact and no gross focal deficits noted.  Psych: Alert, appropriate and with normal affect.   LABORATORY DATA:  EKG:  EKG is ordered today. This sinus rhythm - HR is 71 with RAE - PAC noted - unchanged. HR has improved.   Lab Results  Component Value Date   WBC 7.8 10/09/2019   HGB 16.2 (H) 10/09/2019   HCT 48.4 (H) 10/09/2019   PLT 242 10/09/2019   GLUCOSE 108 (H) 10/09/2019   CHOL 153 10/09/2019   TRIG 57 10/09/2019   HDL 66 10/09/2019   LDLCALC 75 10/09/2019   ALT 12 10/09/2019   AST 12 10/09/2019   NA 139 10/09/2019   K 4.2 10/09/2019   CL 100 10/09/2019    CREATININE 1.47 (H) 10/09/2019   BUN 10 10/09/2019   CO2 24 10/09/2019   TSH 1.673 08/21/2018   INR 1.46 08/22/2018   HGBA1C 6.7 03/29/2019   MICROALBUR 0.3 02/01/2016     BNP (last 3 results) No results for input(s): BNP in the last 8760 hours.  ProBNP (last 3 results) No results for input(s): PROBNP in the last 8760 hours.   Other Studies Reviewed Today:  LEFT HEART CATH AND  CORONARY ANGIOGRAPHY 08/2018  Conclusion    Ost Ramus to Ramus lesion is 90% stenosed.  A drug-eluting stent was successfully placed using a STENT SYNERGY DES 2.75X16.  Post intervention, there is a 0% residual stenosis.  1. 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 Mild nonobstructive dz of the LAD, circumflex, and RCA 3. Normal LV function by echo    Echo Study Conclusions 08/2018  - Left ventricle: The cavity size was normal. Systolic function was normal. The estimated ejection fraction was in the range of 60% to 65%. Wall motion was normal; there were no regional wall motion abnormalities. Doppler parameters are consistent with abnormal left ventricular relaxation (grade 1 diastolic dysfunction).  Impressions:  - Compared to the prior study, there has been no significant interval change.    ASSESSMENT & PLAN:   1. Chronic atypical chest pain - repeat EKG today is stable - I think overall she is ok. Needs aggressive risk factor modification.   2. Known CAD - remains on DAPT - CV risk factor modification challenging at best.   3. HTN - BP is much better. No changes. Would continue the increased dose of Metoprolol.   4. HLD - on statin therapy - would continue  5. Morbid obesity - challenging  6. MS - per neurology  7. Chronic pain syndrome  8. Ongoing tobacco abuse - total cessation encouraged.   9. COVID-19 Education: The signs and symptoms of COVID-19 were  discussed with the patient and how to seek care for testing (follow up with PCP or arrange E-visit).  The importance of social distancing, staying at home, hand hygiene and wearing a mask when out in public were discussed today.  Current medicines are reviewed with the patient today.  The patient does not have concerns regarding medicines other than what has been noted above.  The following changes have been made:  See above.  Labs/ tests ordered today include:    Orders Placed This Encounter  Procedures  . EKG 12-Lead     Disposition:   FU with [name] in 6 months.    Patient is agreeable to this plan and will call if any problems develop in the interim.   SignedTruitt Merle, NP  11/22/2019 9:30 AM  [facility] [address] [place], [place]  [address] Phone: [phone] Fax: [phone]

## 2019-11-19 ENCOUNTER — Other Ambulatory Visit: Payer: Self-pay

## 2019-11-20 ENCOUNTER — Encounter: Payer: Self-pay | Admitting: Obstetrics and Gynecology

## 2019-11-20 ENCOUNTER — Ambulatory Visit (INDEPENDENT_AMBULATORY_CARE_PROVIDER_SITE_OTHER): Payer: Medicare Other

## 2019-11-20 ENCOUNTER — Other Ambulatory Visit: Payer: Self-pay | Admitting: Obstetrics and Gynecology

## 2019-11-20 ENCOUNTER — Ambulatory Visit (INDEPENDENT_AMBULATORY_CARE_PROVIDER_SITE_OTHER): Payer: Medicare Other | Admitting: Obstetrics and Gynecology

## 2019-11-20 DIAGNOSIS — N950 Postmenopausal bleeding: Secondary | ICD-10-CM

## 2019-11-20 DIAGNOSIS — R9389 Abnormal findings on diagnostic imaging of other specified body structures: Secondary | ICD-10-CM

## 2019-11-20 DIAGNOSIS — D259 Leiomyoma of uterus, unspecified: Secondary | ICD-10-CM | POA: Diagnosis not present

## 2019-11-20 NOTE — Progress Notes (Signed)
   Tiffany Brady  [birth year] DE:[id number]  HPI The patient is a 59 y.o. G2P0011 who presents today for a pelvic ultrasound and endometrial biopsy due to postmenopausal bleeding.  Please see the previous encounter note for details.  Endometrial biopsy procedure note The cervix was cleansed with a Betadine swab. The endometrial Pipelle sampler was introduced into the cervix but did not get past the internal os.  The cervical os finder was used to dilate past the cervical os.  The endometrial Pipelle was then inserted into the endometrial cavity, revealing a sound length of 10 cm.  The plunger was withdrawn causing suction in the Pipelle and the device was moved about the cavity for about 10 sec.  The Pipelle was removed then the sample was emptied in a specimen cup, maintaining sterility.  Just one pass was made due to patient discomfort.  The specimen contents were collected and sent to pathology for analysis.  The patient tolerated the procedure well without any complication.  [name] present for the procedure  Pelvic ultrasound Anteverted uterus 9.5 x 5.1 x 4.1 cm, normal size and shape Two small left-sided fibroids with calcification, the largest measuring 2.0 x 1.7 cm. Endometrium appears thickened measuring 5 to 6 mm No obvious endometrial masses or abnormal blood flow pattern Bilateral ovaries appear normal with fibrous appearance, no masses or cysts No adnexal masses.  No free fluid.  Joseph Pierini MD 11/20/19

## 2019-11-22 ENCOUNTER — Ambulatory Visit (INDEPENDENT_AMBULATORY_CARE_PROVIDER_SITE_OTHER): Payer: Medicare Other | Admitting: Nurse Practitioner

## 2019-11-22 ENCOUNTER — Encounter: Payer: Self-pay | Admitting: Nurse Practitioner

## 2019-11-22 VITALS — BP 128/78 | HR 76 | Ht 64.0 in | Wt 269.8 lb

## 2019-11-22 DIAGNOSIS — Z720 Tobacco use: Secondary | ICD-10-CM

## 2019-11-22 DIAGNOSIS — I10 Essential (primary) hypertension: Secondary | ICD-10-CM

## 2019-11-22 DIAGNOSIS — I2510 Atherosclerotic heart disease of native coronary artery without angina pectoris: Secondary | ICD-10-CM

## 2019-11-22 DIAGNOSIS — Z7189 Other specified counseling: Secondary | ICD-10-CM

## 2019-11-22 DIAGNOSIS — R079 Chest pain, unspecified: Secondary | ICD-10-CM | POA: Diagnosis not present

## 2019-11-22 DIAGNOSIS — Z79899 Other long term (current) drug therapy: Secondary | ICD-10-CM

## 2019-11-22 LAB — PATHOLOGY REPORT

## 2019-11-22 LAB — TISSUE SPECIMEN

## 2019-11-22 NOTE — Patient Instructions (Addendum)
After Visit Summary:  We will be checking the following labs today - NONE   Medication Instructions:    Continue with your current medicines.    If you need a refill on your cardiac medications before your next appointment, please call your pharmacy.     Testing/Procedures To Be Arranged:  N/A  Follow-Up:   See Dr. Tamala Julian in 6 months -  You will receive a reminder letter in the mail two months in advance. If you don't receive a letter, please call our office to schedule the follow-up appointment.     At [facility], you and your health needs are our priority.  As part of our continuing mission to provide you with exceptional heart care, we have created designated Provider Care Teams.  These Care Teams include your primary Cardiologist (physician) and Advanced Practice Providers (APPs -  Physician Assistants and Nurse Practitioners) who all work together to provide you with the care you need, when you need it.  Special Instructions:  . Stay safe, stay home, wash your hands for at least 20 seconds and wear a mask when out in public.  . It was good to talk with you today.  Marland Kitchen Keep trying to work on your smoking.    Call the [place] office at [phone] if you have any questions, problems or concerns.

## 2019-12-03 ENCOUNTER — Other Ambulatory Visit: Payer: Self-pay | Admitting: Interventional Cardiology

## 2019-12-03 ENCOUNTER — Other Ambulatory Visit: Payer: Self-pay | Admitting: Physician Assistant

## 2019-12-20 ENCOUNTER — Other Ambulatory Visit: Payer: Self-pay | Admitting: Internal Medicine

## 2019-12-24 ENCOUNTER — Other Ambulatory Visit (HOSPITAL_COMMUNITY)
Admission: RE | Admit: 2019-12-24 | Discharge: 2019-12-24 | Disposition: A | Discharge status: Home / Self Care | Payer: Medicare Other | Source: Ambulatory Visit | Attending: Internal Medicine | Admitting: Internal Medicine

## 2019-12-24 ENCOUNTER — Ambulatory Visit (HOSPITAL_BASED_OUTPATIENT_CLINIC_OR_DEPARTMENT_OTHER): Payer: Medicare Other | Admitting: Internal Medicine

## 2019-12-24 ENCOUNTER — Encounter: Payer: Self-pay | Admitting: Internal Medicine

## 2019-12-24 ENCOUNTER — Other Ambulatory Visit: Payer: Self-pay

## 2019-12-24 VITALS — BP 162/94 | HR 68 | Temp 97.0°F | Resp 16 | Wt 266.6 lb

## 2019-12-24 DIAGNOSIS — N898 Other specified noninflammatory disorders of vagina: Secondary | ICD-10-CM

## 2019-12-24 DIAGNOSIS — S39011A Strain of muscle, fascia and tendon of abdomen, initial encounter: Secondary | ICD-10-CM

## 2019-12-24 DIAGNOSIS — F17200 Nicotine dependence, unspecified, uncomplicated: Secondary | ICD-10-CM

## 2019-12-24 DIAGNOSIS — R221 Localized swelling, mass and lump, neck: Secondary | ICD-10-CM | POA: Diagnosis not present

## 2019-12-24 DIAGNOSIS — I10 Essential (primary) hypertension: Secondary | ICD-10-CM | POA: Diagnosis not present

## 2019-12-24 DIAGNOSIS — E1159 Type 2 diabetes mellitus with other circulatory complications: Secondary | ICD-10-CM | POA: Diagnosis not present

## 2019-12-24 DIAGNOSIS — S39013A Strain of muscle, fascia and tendon of pelvis, initial encounter: Secondary | ICD-10-CM

## 2019-12-24 DIAGNOSIS — G35 Multiple sclerosis: Secondary | ICD-10-CM | POA: Diagnosis not present

## 2019-12-24 DIAGNOSIS — Z6841 Body Mass Index (BMI) 40.0 and over, adult: Secondary | ICD-10-CM

## 2019-12-24 LAB — POCT GLYCOSYLATED HEMOGLOBIN (HGB A1C): HbA1c, POC (prediabetic range): 6.4 % (ref 5.7–6.4)

## 2019-12-24 LAB — GLUCOSE, POCT (MANUAL RESULT ENTRY): POC Glucose: 121 mg/dl — AB (ref 70–99)

## 2019-12-24 MED ORDER — TRAMADOL HCL 50 MG PO TABS: 50.0000 mg | ORAL_TABLET | Freq: Two times a day (BID) | ORAL | 0 refills | Status: DC | PRN

## 2019-12-24 MED ORDER — FLUCONAZOLE 150 MG PO TABS: 150.0000 mg | ORAL_TABLET | Freq: Once | ORAL | 0 refills | Status: AC

## 2019-12-24 MED ORDER — CYCLOBENZAPRINE HCL 10 MG PO TABS: ORAL_TABLET | ORAL | 2 refills | Status: DC

## 2019-12-24 NOTE — Progress Notes (Signed)
Pt states she is having left arm pain

## 2019-12-24 NOTE — Progress Notes (Signed)
Patient ID: Tiffany Brady, female    DOB: 28-Feb-1961  MRN: [id number]  CC: Diabetes, Hypertension, and Dizziness   Subjective: Tiffany Brady is a 59 y.o. female who presents for chronic ds management Her concerns today include:  Pt with hx of DM type 2, HTN, Tob dep,CAD,obesity, fibromyalgia, CKD stage 3 and multiple sclerosis.    Postmenopausal bleeding:  Saw GYN with gynecologist Dr. Delilah Shan.  Pelvic ultrasound revealed small fibroids in the uterus and thickened endometrium.  Subsequent endometrial biopsy was negative.  HYPERTENSION/CAD Currently taking: see medication list Med Adherence: [x]  Yes but did not take as yet for the morning.  She is seen the cardiologist since her last visit.  Metoprolol was increased to 50 mg twice a day due to uncontrolled blood pressure.     Medication side effects: []  Yes    [x]  No Adherence with salt restriction: [x]  Yes    []  No Home Monitoring?: []  Yes    [x]  No Monitoring Frequency: []  Yes    []  No Home BP results range: []  Yes    []  No SOB? [x]  Yes just today when she walked from [place] to this office   []  No Chest Pain?: [x]  Yes    []  No Leg swelling?: []  Yes    [x]  No Headaches?: []  Yes    [x]  No Dizziness? [x]  Yes  Comments:   MS: had telemed visit with Dr. Posey Pronto 09/2019.  Given rxn for roller walker but has not gotten as yet.  No further falls.  Feels off balance and dizzy at times. Has shower bar.  Would like shower chair.   DIABETES TYPE 2 Last A1C:   Results for orders placed or performed in visit on 12/24/19  POCT glucose (manual entry)  Result Value Ref Range   POC Glucose 121 (A) 70 - 99 mg/dl  POCT glycosylated hemoglobin (Hb A1C)  Result Value Ref Range   Hemoglobin A1C     HbA1c POC (<> result, manual entry)     HbA1c, POC (prediabetic range) 6.4 5.7 - 6.4 %   HbA1c, POC (controlled diabetic range)      Med Adherence:  Diet control Medication side effects:  []  Yes    []  No Home Monitoring?  []  Yes    [x]   No Home glucose results range: Diet Adherence: doing better.  Cut back on cookies and cake.  Still uses sugar in coffee and KoolAid.  Would like to use Splenda but reports it is more expensive Exercise: [x]  Yes she is trying to get out side of her apartment and walk a little bit a few times a week Hypoglycemic episodes?: []  Yes    []  No Numbness of the feet? []  Yes    []  No Retinopathy hx? []  Yes    []  No Last eye exam: She is up-to-date with her eye exam. Comments:  Tob dep:  Smoking more since the pandemic. >1/2 pk/day.  Trying to quit.  Complains of pain in the right groin that started several days ago after she was trying to get up into a high truck.  She felt she pulled a muscle and its been aching ever since.  Denies any bulging in the groin area.  She is out of Flexeril and is requesting a refill on that.  She has been taking Tylenol for pain which does not help.  Requesting stronger pain medication.  Also complains of swelling in the soft tissue of the left side of the neck.  It has been like this for several months.  The area is tender to touch.  Denies any pain going down the left arm.  No swelling in the left arm..    Complains of some vaginal itching x3 days.  No vaginal discharge. Patient Active Problem List   Diagnosis Date Noted  . Postmenopausal bleeding 08/12/2019  . Alopecia 03/29/2019  . Subcutaneous cyst 03/29/2019  . Snoring 12/10/2018  . NSTEMI (non-ST elevated myocardial infarction) ([place]) 08/21/2018  . De Quervain's disease (tenosynovitis) 08/02/2018  . Body mass index 40.0-44.9, adult ([place]) 08/02/2018  . Asymmetrical hearing loss of both ears 01/03/2018  . Stress incontinence 05/08/2017  . Cervical radiculopathy 05/08/2017  . CKD (chronic kidney disease) stage 3, GFR 30-59 ml/min 04/04/2017  . Vitamin D deficiency 04/04/2017  . Diabetes mellitus type II, controlled ([place]) 04/04/2017  . Fibromyalgia 03/28/2016  . Multiple sclerosis ([place]) 11/02/2015  . Tobacco  abuse 04/24/2014  . Back muscle spasm 01/28/2014  . Migraine headache 07/23/2013  . Essential hypertension 06/27/2013  . Chronic pain syndrome 06/27/2013  . Morbid obesity ([place]) 06/27/2013     Current Outpatient Medications on File Prior to Visit  Medication Sig Dispense Refill  . Accu-Chek Softclix Lancets lancets AS DIRECTED DAILY 100 each 11  . AMBULATORY NON FORMULARY MEDICATION 4-wheeled rollator 1 each 0  . amLODipine (NORVASC) 5 MG tablet TAKE ONE TABLET BY MOUTH DAILY 90 tablet 0  . aspirin EC 81 MG tablet Take 1 tablet (81 mg total) by mouth daily. 100 tablet 1  . atorvastatin (LIPITOR) 80 MG tablet TAKE ONE TABLET BY MOUTH DAILY AT 6PM 90 tablet 3  . Blood Glucose Monitoring Suppl (TRUE METRIX GO GLUCOSE METER) w/Device KIT 1 each by Does not apply route every 8 (eight) hours as needed. 1 kit 0  . CHANTIX 1 MG tablet TAKE ONE TABLET BY MOUTH TWICE A DAY 56 tablet 1  . cholecalciferol (VITAMIN D3) 25 MCG (1000 UT) tablet Take 5,000 Units by mouth daily.    . clopidogrel (PLAVIX) 75 MG tablet TAKE ONE TABLET BY MOUTH DAILY 30 tablet 6  . diclofenac sodium (VOLTAREN) 1 % GEL APPLY TWO GRAMS TOPICALLY FOUR TIMES A DAY 100 g 0  . Dimethyl Fumarate (TECFIDERA) 240 MG CPDR Take 1 capsule (240 mg total) by mouth 2 (two) times daily. 180 capsule 3  . DULoxetine (CYMBALTA) 30 MG capsule Take 1 capsule (30 mg total) by mouth daily. Must have office visit for refills 30 capsule 0  . ezetimibe (ZETIA) 10 MG tablet TAKE ONE TABLET BY MOUTH DAILY 90 tablet 3  . fluticasone (FLONASE) 50 MCG/ACT nasal spray PLACE ONE SPRAY INTO BOTH NOSTRILS DAILY AS NEEDED FOR ALLERGIES OR RHINITIS 16 g 0  . glucose blood (TRUE METRIX BLOOD GLUCOSE TEST) test strip Check blood sugar once a day 100 each 12  . losartan (COZAAR) 25 MG tablet Take 1 tablet (25 mg total) by mouth daily. Must have office visit for refills 30 tablet 0  . metoprolol tartrate (LOPRESSOR) 50 MG tablet Take 1 tablet (50 mg total) by mouth 2  (two) times daily. 180 tablet 3  . nitroGLYCERIN (NITROSTAT) 0.4 MG SL tablet Place 1 tablet (0.4 mg total) under the tongue every 5 (five) minutes x 3 doses as needed for chest pain. 25 tablet 12  . pregabalin (LYRICA) 25 MG capsule Take 1 capsule (25 mg total) by mouth 2 (two) times daily. 60 capsule 2   No current facility-administered medications on file prior to  visit.    Allergies  Allergen Reactions  . Pork-Derived Metallurgist    Social History   Socioeconomic History  . Marital status: Widowed    Spouse name: Not on file  . Number of children: Not on file  . Years of education: Not on file  . Highest education level: Not on file  Occupational History  . Not on file  Tobacco Use  . Smoking status: Current Every Day Smoker    Packs/day: 0.75    Years: 40.00    Pack years: 30.00    Types: Cigarettes  . Smokeless tobacco: Never Used  Substance and Sexual Activity  . Alcohol use: Yes    Comment: occasional  . Drug use: No  . Sexual activity: Yes    Birth control/protection: Post-menopausal    Comment: 1st intercourse 59 yo-More than 5 partners  Other Topics Concern  . Not on file  Social History Narrative   Lives with niece and her 3 children in a 2 story home.     Only goes upstairs once a day.  Has 1 child.  Does not work.  Trying to get disability.  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     Education: 10th grade.   Social Determinants of Health   Financial Resource Strain:   . Difficulty of Paying Living Expenses:   Food Insecurity:   . Worried About Charity fundraiser in the Last Year:   . Arboriculturist in the Last Year:   Transportation Needs:   . Film/video editor (Medical):   Marland Kitchen Lack of Transportation (Non-Medical):   Physical Activity:   . Days of Exercise per Week:   . Minutes of Exercise per Session:   Stress:   . Feeling of Stress :   Social Connections:   . Frequency of Communication with Friends and Family:   .  Frequency of Social Gatherings with Friends and Family:   . Attends Religious Services:   . Active Member of Clubs or Organizations:   . Attends Archivist Meetings:   Marland Kitchen Marital Status:   Intimate Partner Violence:   . Fear of Current or Ex-Partner:   . Emotionally Abused:   Marland Kitchen Physically Abused:   . Sexually Abused:     Family History  Problem Relation Age of Onset  . Cancer Mother        Deceased-Stomach  . Hypertension Mother   . Other Father        Deceased, 38  . HIV/AIDS Brother        Deceased  . Multiple sclerosis Daughter   . Breast cancer Neg Hx     Past Surgical History:  Procedure Laterality Date  . FOOT SURGERY Right   . LEFT HEART CATH AND CORONARY ANGIOGRAPHY N/A 08/22/2018   Procedure: LEFT HEART CATH AND CORONARY ANGIOGRAPHY;  Surgeon: Sherren Mocha, MD;  Location: [facility];  Service: Cardiovascular;  Laterality: N/A;    ROS: Review of Systems Negative except as stated above  PHYSICAL EXAM: BP (!) 162/94   Pulse 68   Temp (!) 97 F (36.1 C)   Resp 16   Wt 266 lb 9.6 oz (120.9 kg)   LMP 04/21/2014   SpO2 96%   BMI 45.76 kg/m   Wt Readings from Last 3 Encounters:  12/24/19 266 lb 9.6 oz (120.9 kg)  11/22/19 269 lb 12.8 oz (122.4 kg)  11/05/19 271 lb (122.9 kg)   Sitting:  BP 169/97, pulse 63 Standing: BP 165/104, pulse 79  Physical Exam  General appearance - alert, well appearing, obese older African-American female and in no distress Mental status - normal mood, behavior, speech, dress, motor activity, and thought processes Mouth - mucous membranes moist, pharynx normal without lesions Neck -no cervical lymphadenopathy.  She has a fullness in both supraclavicular area left side greater than the right.  Left side firm and tender to touch. Chest - clear to auscultation, no wheezes, rales or rhonchi, symmetric air entry Heart - normal rate, regular rhythm, normal S1, S2, no murmurs, rubs, clicks or gallops Abdomen - soft,  nontender, nondistended, no masses or organomegaly.  No hernia appreciated in the inguinal areas Neurological -she ambulates with a cane.  She needs assistance with getting onto the exam table Musculoskeletal -mild tenderness on palpation of the upper inner thigh muscle.  She has discomfort with passive range of motion of the right hip. Extremities -no lower extremity edema  CMP Latest Ref Rng & Units 10/09/2019 02/28/2019 12/27/2018  Glucose 65 - 99 mg/dL 108(H) - 129(H)  BUN 6 - 24 mg/dL 10 - 11  Creatinine 0.57 - 1.00 mg/dL 1.47(H) - 1.41(H)  Sodium 134 - 144 mmol/L 139 - 142  Potassium 3.5 - 5.2 mmol/L 4.2 - 3.9  Chloride 96 - 106 mmol/L 100 - 104  CO2 20 - 29 mmol/L 24 - 21  Calcium 8.7 - 10.2 mg/dL 9.6 - 9.4  Total Protein 6.0 - 8.5 g/dL 7.6 6.8 7.4  Total Bilirubin 0.0 - 1.2 mg/dL 0.3 0.5 0.6  Alkaline Phos 39 - 117 IU/L 104 91 88  AST 0 - 40 IU/L 12 15 14   ALT 0 - 32 IU/L 12 14 18    Lipid Panel     Component Value Date/Time   CHOL 153 10/09/2019 1136   TRIG 57 10/09/2019 1136   HDL 66 10/09/2019 1136   CHOLHDL 2.3 10/09/2019 1136   CHOLHDL 3.1 08/21/2018 1442   VLDL 6 08/21/2018 1442   LDLCALC 75 10/09/2019 1136    CBC    Component Value Date/Time   WBC 7.8 10/09/2019 1136   WBC 7.2 08/23/2018 0246   RBC 5.45 (H) 10/09/2019 1136   RBC 4.67 08/23/2018 0246   HGB 16.2 (H) 10/09/2019 1136   HCT 48.4 (H) 10/09/2019 1136   PLT 242 10/09/2019 1136   MCV 89 10/09/2019 1136   MCH 29.7 10/09/2019 1136   MCH 30.0 08/23/2018 0246   MCHC 33.5 10/09/2019 1136   MCHC 32.7 08/23/2018 0246   RDW 12.3 10/09/2019 1136   LYMPHSABS 2.6 09/22/2017 1147   MONOABS 0.6 09/22/2017 1147   EOSABS 0.6 09/22/2017 1147   BASOSABS 0.1 09/22/2017 1147    ASSESSMENT AND PLAN: 1. Controlled type 2 diabetes mellitus with other circulatory complication, without long-term current use of insulin ([place]) Commended her on weight loss.  Commended her on trying to make some dietary changes.   Encouraged her to use 0-calorie sweeteners in her coffee and Kool-Aid.  Further dietary counseling given - POCT glucose (manual entry) - POCT glycosylated hemoglobin (Hb A1C)  2. Essential hypertension Not at goal.  She has not taken medicines as yet for today.  Encourage compliance with medications and low-salt diet  3. Class 3 severe obesity due to excess calories with serious comorbidity and body mass index (BMI) of 45.0 to 49.9 in adult [facility]) See #1 above  4. Multiple sclerosis ([place]) Followed by neurology. Handwritten prescription given for shower chair. -  cyclobenzaprine (FLEXERIL) 10 MG tablet; TAKE 1 TABLET (10 MG TOTAL) BY MOUTH TWO (TWO) TIMES DAILY AS NEEDED FOR MUSCLE SPASMS.  Dispense: 60 tablet; Refill: 2  5. Neck mass I think this may be a lipoma but will get imaging studies to be sure this is not some of the solid mass especially since it is much larger on the left than the right side. - US Soft Tissue Head/Neck (NON-THYROID); Future  6. Strain of muscle of right groin region I have given refill on Flexeril.  I have also given a limited supply on tramadol to use as needed.  Inform patient that the medication can cause drowsiness. - traMADol (ULTRAM) 50 MG tablet; Take 1 tablet (50 mg total) by mouth every 12 (twelve) hours as needed.  Dispense: 30 tablet; Refill: 0  7. Tobacco dependence Strongly advised to quit smoking.  8. Vaginal itching - fluconazole (DIFLUCAN) 150 MG tablet; Take 1 tablet (150 mg total) by mouth once for 1 dose.  Dispense: 1 tablet; Refill: 0 - Cervicovaginal ancillary only    Patient was given the opportunity to ask questions.  Patient verbalized understanding of the plan and was able to repeat key elements of the plan.   Orders Placed This Encounter  Procedures  . US Soft Tissue Head/Neck (NON-THYROID)  . POCT glucose (manual entry)  . POCT glycosylated hemoglobin (Hb A1C)     Requested Prescriptions   Signed Prescriptions Disp  Refills  . cyclobenzaprine (FLEXERIL) 10 MG tablet 60 tablet 2    Sig: TAKE 1 TABLET (10 MG TOTAL) BY MOUTH TWO (TWO) TIMES DAILY AS NEEDED FOR MUSCLE SPASMS.  Marland Kitchen traMADol (ULTRAM) 50 MG tablet 30 tablet 0    Sig: Take 1 tablet (50 mg total) by mouth every 12 (twelve) hours as needed.  . fluconazole (DIFLUCAN) 150 MG tablet 1 tablet 0    Sig: Take 1 tablet (150 mg total) by mouth once for 1 dose.    Return in about 4 months (around 04/24/2020).  Karle Plumber, MD, FACP

## 2019-12-25 ENCOUNTER — Telehealth: Payer: Self-pay

## 2019-12-25 LAB — CERVICOVAGINAL ANCILLARY ONLY
Candida Glabrata: NEGATIVE
Candida Vaginitis: POSITIVE — AB
Comment: NEGATIVE
Comment: NEGATIVE

## 2019-12-25 NOTE — Telephone Encounter (Signed)
Contacted pt to go over swab results pt is aware and doesn't have any questions or concerns

## 2019-12-31 ENCOUNTER — Ambulatory Visit (HOSPITAL_COMMUNITY): Payer: Medicare Other

## 2020-01-06 ENCOUNTER — Other Ambulatory Visit: Payer: Self-pay | Admitting: Internal Medicine

## 2020-01-06 DIAGNOSIS — I10 Essential (primary) hypertension: Secondary | ICD-10-CM

## 2020-01-29 ENCOUNTER — Other Ambulatory Visit: Payer: Self-pay | Admitting: Physician Assistant

## 2020-02-11 ENCOUNTER — Telehealth: Payer: Self-pay | Admitting: Internal Medicine

## 2020-02-11 NOTE — Telephone Encounter (Signed)
Will forward to pcp

## 2020-02-11 NOTE — Telephone Encounter (Signed)
Patient called requesting to speak with her nurse bout the COVID vaccine. Patient would like to know which vaccine to take. Please f/u

## 2020-02-12 NOTE — Telephone Encounter (Signed)
Contacted pt and went over Dr. Johnson response pt is aware and doesn't have any questions or concerns 

## 2020-02-13 ENCOUNTER — Telehealth: Payer: Self-pay | Admitting: Neurology

## 2020-02-13 NOTE — Telephone Encounter (Signed)
Patient left message with Access[employer]urse:  "Caller states she is trying to leave a message for Dr. Posey Pronto in regards that she hasn't taken the vaccine as of yet because she doesn't know which one is best for her. Please call."

## 2020-02-19 NOTE — Telephone Encounter (Signed)
Pls let her know that the [place] states that the [place] J&J vaccines are safe for people with MS.

## 2020-02-19 NOTE — Telephone Encounter (Signed)
Patient advised.

## 2020-03-05 ENCOUNTER — Ambulatory Visit (HOSPITAL_COMMUNITY)
Admission: EM | Admit: 2020-03-05 | Discharge: 2020-03-05 | Disposition: A | Discharge status: Home / Self Care | Payer: Medicare Other | Attending: Physician Assistant | Admitting: Physician Assistant

## 2020-03-05 ENCOUNTER — Encounter (HOSPITAL_COMMUNITY): Payer: Self-pay

## 2020-03-05 ENCOUNTER — Other Ambulatory Visit: Payer: Self-pay

## 2020-03-05 DIAGNOSIS — R221 Localized swelling, mass and lump, neck: Secondary | ICD-10-CM | POA: Diagnosis not present

## 2020-03-05 DIAGNOSIS — S39011A Strain of muscle, fascia and tendon of abdomen, initial encounter: Secondary | ICD-10-CM

## 2020-03-05 MED ORDER — TRAMADOL HCL 50 MG PO TABS: 50.0000 mg | ORAL_TABLET | Freq: Two times a day (BID) | ORAL | 0 refills | Status: DC | PRN

## 2020-03-05 NOTE — ED Provider Notes (Signed)
[facility]    CSN: [id number] Arrival date & time: 03/05/20  1602      History   Chief Complaint Chief Complaint  Patient presents with  . Arm Pain  . Leg Pain    HPI Tiffany Brady is a 59 y.o. female.   The history is provided by the patient. No language interpreter was used.  Arm Pain This is a new problem. The current episode started more than 1 week ago. The problem occurs constantly. The problem has been gradually worsening. Nothing aggravates the symptoms. Nothing relieves the symptoms. She has tried nothing for the symptoms. The treatment provided mild relief.  Leg Pain Pt complains of a swollen area that is painful to the left upper chest/neck area   Past Medical History:  Diagnosis Date  . Chronic kidney disease    pt reported kidney failure  . Chronic pain syndrome   . Gout   . Hypertension   . MS (multiple sclerosis) ([place])   . Tobacco use     Patient Active Problem List   Diagnosis Date Noted  . Postmenopausal bleeding 08/12/2019  . Alopecia 03/29/2019  . Subcutaneous cyst 03/29/2019  . Snoring 12/10/2018  . NSTEMI (non-ST elevated myocardial infarction) ([place]) 08/21/2018  . De Quervain's disease (tenosynovitis) 08/02/2018  . Body mass index 40.0-44.9, adult ([place]) 08/02/2018  . Asymmetrical hearing loss of both ears 01/03/2018  . Stress incontinence 05/08/2017  . Cervical radiculopathy 05/08/2017  . CKD (chronic kidney disease) stage 3, GFR 30-59 ml/min 04/04/2017  . Vitamin D deficiency 04/04/2017  . Diabetes mellitus type II, controlled ([place]) 04/04/2017  . Fibromyalgia 03/28/2016  . Multiple sclerosis ([place]) 11/02/2015  . Tobacco abuse 04/24/2014  . Back muscle spasm 01/28/2014  . Migraine headache 07/23/2013  . Essential hypertension 06/27/2013  . Chronic pain syndrome 06/27/2013  . Morbid obesity ([place]) 06/27/2013    Past Surgical History:  Procedure Laterality Date  . FOOT SURGERY Right   . LEFT HEART CATH AND CORONARY  ANGIOGRAPHY N/A 08/22/2018   Procedure: LEFT HEART CATH AND CORONARY ANGIOGRAPHY;  Surgeon: Sherren Mocha, MD;  Location: [facility];  Service: Cardiovascular;  Laterality: N/A;    OB History    Gravida  2   Para  1   Term      Preterm      AB  1   Living  1     SAB  1   TAB      Ectopic      Multiple      Live Births               Home Medications    Prior to Admission medications   Medication Sig Start Date End Date Taking? Authorizing Provider  Accu-Chek Softclix Lancets lancets AS DIRECTED DAILY 04/01/19   Ladell Pier, MD  AMBULATORY NON FORMULARY MEDICATION 4-wheeled rollator 10/11/19   Narda Amber K, DO  amLODipine (NORVASC) 5 MG tablet TAKE ONE TABLET BY MOUTH DAILY 01/07/20   Ladell Pier, MD  aspirin EC 81 MG tablet Take 1 tablet (81 mg total) by mouth daily. 04/04/17   Ladell Pier, MD  atorvastatin (LIPITOR) 80 MG tablet TAKE ONE TABLET BY MOUTH DAILY AT [facility] 12/03/19   Belva Crome, MD  Blood Glucose Monitoring Suppl (TRUE METRIX GO GLUCOSE METER) w/Device KIT 1 each by Does not apply route every 8 (eight) hours as needed. 03/28/16   Maren Reamer, MD  CHANTIX 1 MG  tablet TAKE ONE TABLET BY MOUTH TWICE A DAY 12/20/19   Ladell Pier, MD  cholecalciferol (VITAMIN D3) 25 MCG (1000 UT) tablet Take 5,000 Units by mouth daily.    [provider]  clopidogrel (PLAVIX) 75 MG tablet TAKE ONE TABLET BY MOUTH DAILY 01/29/20   Belva Crome, MD  cyclobenzaprine (FLEXERIL) 10 MG tablet TAKE 1 TABLET (10 MG TOTAL) BY MOUTH TWO (TWO) TIMES DAILY AS NEEDED FOR MUSCLE SPASMS. 12/24/19   Ladell Pier, MD  diclofenac sodium (VOLTAREN) 1 % GEL APPLY TWO GRAMS TOPICALLY FOUR TIMES A DAY 06/10/19   Ladell Pier, MD  Dimethyl Fumarate (TECFIDERA) 240 MG CPDR Take 1 capsule (240 mg total) by mouth 2 (two) times daily. 03/12/19   Patel, Arvin Collard K, DO  DULoxetine (CYMBALTA) 30 MG capsule Take 1 capsule (30 mg total) by mouth  daily. Must have office visit for refills 07/16/19   Ladell Pier, MD  ezetimibe (ZETIA) 10 MG tablet TAKE ONE TABLET BY MOUTH DAILY 12/03/19   Belva Crome, MD  fluticasone [facility]) 50 MCG/ACT nasal spray PLACE ONE SPRAY INTO BOTH NOSTRILS DAILY AS NEEDED FOR ALLERGIES OR RHINITIS 06/10/19   Ladell Pier, MD  glucose blood (TRUE METRIX BLOOD GLUCOSE TEST) test strip Check blood sugar once a day 12/28/18   Ladell Pier, MD  losartan (COZAAR) 25 MG tablet Take 1 tablet (25 mg total) by mouth daily. Must have office visit for refills 07/16/19   Ladell Pier, MD  metoprolol tartrate (LOPRESSOR) 50 MG tablet Take 1 tablet (50 mg total) by mouth 2 (two) times daily. 10/09/19 01/07/20  Burtis Junes, NP  nitroGLYCERIN (NITROSTAT) 0.4 MG SL tablet Place 1 tablet (0.4 mg total) under the tongue every 5 (five) minutes x 3 doses as needed for chest pain. 08/23/18   Bhagat, Crista Luria, PA  pregabalin (LYRICA) 25 MG capsule Take 1 capsule (25 mg total) by mouth 2 (two) times daily. 07/23/18   Ladell Pier, MD  traMADol (ULTRAM) 50 MG tablet Take 1 tablet (50 mg total) by mouth every 12 (twelve) hours as needed. 03/05/20   Fransico Meadow, PA-C    Family History Family History  Problem Relation Age of Onset  . Cancer Mother        Deceased-Stomach  . Hypertension Mother   . Other Father        Deceased, 32  . HIV/AIDS Brother        Deceased  . Multiple sclerosis Daughter   . Breast cancer Neg Hx     Social History Social History   Tobacco Use  . Smoking status: Current Every Day Smoker    Packs/day: 0.75    Years: 40.00    Pack years: 30.00    Types: Cigarettes  . Smokeless tobacco: Never Used  Vaping Use  . Vaping Use: Never used  Substance Use Topics  . Alcohol use: Yes    Comment: occasional  . Drug use: No     Allergies   Pork-derived products   Review of Systems Review of Systems  All other systems reviewed and are negative.    Physical  Exam Triage Vital Signs ED Triage Vitals  Enc Vitals Group     BP 03/05/20 1645 (!) 154/93     Pulse Rate 03/05/20 1645 74     Resp 03/05/20 1645 15     Temp 03/05/20 1645 97.9 F (36.6 C)     Temp src --  SpO2 03/05/20 1645 97 %     Weight --      Height --      Head Circumference --      Peak Flow --      Pain Score 03/05/20 1641 10     Pain Loc --      Pain Edu? --      Excl. in [place]? --    No data found.  Updated Vital Signs BP (!) 154/93   Pulse 74   Temp 97.9 F (36.6 C)   Resp 15   LMP 04/21/2014   SpO2 97%   Visual Acuity Right Eye Distance:   Left Eye Distance:   Bilateral Distance:    Right Eye Near:   Left Eye Near:    Bilateral Near:     Physical Exam Vitals and nursing note reviewed.  Constitutional:      Appearance: She is well-developed.  HENT:     Head: Normocephalic.  Cardiovascular:     Rate and Rhythm: Normal rate.  Pulmonary:     Effort: Pulmonary effort is normal.  Abdominal:     General: There is no distension.  Musculoskeletal:        General: Normal range of motion.     Cervical back: Normal range of motion.  Skin:    General: Skin is warm.     Comments: Swollen area left upper chest,  Looks and feels like a lypoma.   Neurological:     Mental Status: She is alert and oriented to person, place, and time.  Psychiatric:        Mood and Affect: Mood normal.      UC Treatments / Results  Labs (all labs ordered are listed, but only abnormal results are displayed) Labs Reviewed - No data to display  EKG   Radiology No results found.  Procedures Procedures (including critical care time)  Medications Ordered in UC Medications - No data to display  Initial Impression / Assessment and Plan / UC Course  I have reviewed the triage vital signs and the nursing notes.  Pertinent labs & imaging results that were available during my care of the patient were reviewed by me and considered in my medical decision making (see  chart for details).    MDM: Pt was suppose to have an ultrasound but she missed appointment.  Pt advised to call her MD to reschedule ultrasound.  Pt advised area needs further evaluation I will treat her pain   Final Clinical Impressions(s) / UC Diagnoses   Final diagnoses:  Mass of left side of neck     Discharge Instructions     See your Physician for recheck and to have ultrasound rescheduled.     ED Prescriptions    Medication Sig Dispense Auth. Provider   traMADol (ULTRAM) 50 MG tablet Take 1 tablet (50 mg total) by mouth every 12 (twelve) hours as needed. 20 tablet Fransico Meadow, [place]     I have reviewed the PDMP during this encounter.  An After Visit Summary was printed and given to the patient.    Fransico Meadow, [place] 03/05/20 1912

## 2020-03-05 NOTE — ED Triage Notes (Signed)
Patient reports left arm and right leg pain since April.

## 2020-03-05 NOTE — Discharge Instructions (Addendum)
See your Physician for recheck and to have ultrasound rescheduled.

## 2020-03-25 ENCOUNTER — Other Ambulatory Visit: Payer: Self-pay | Admitting: Internal Medicine

## 2020-04-06 ENCOUNTER — Telehealth: Payer: Self-pay | Admitting: Neurology

## 2020-04-06 NOTE — Telephone Encounter (Addendum)
She has known chronic pain from fibromyalgia and sees her PCP for this, so best to follow-up with them.  If they feel her symptoms are not due to fibromyalgia and needs neurological evaluation, she can schedule follow-up with me.

## 2020-04-06 NOTE — Telephone Encounter (Signed)
Spoke with Tiffany Brady and told her Dr Posey Pronto recommends she f/u with her PCP to see if pain is r/t chronic fibromyalgia pain and if they think she needs neurology intervention for pain then we can schedule her for f/u, she verbalized understanding.

## 2020-04-06 NOTE — Telephone Encounter (Signed)
Spoke with pt who states she has received her Tecfidera in the mail. She does state she is having bad pain in both legs, from thighs to feet, for ~1 week, has had to stay in bed because of the pain, wasn't sure if Dr Posey Pronto should address or if she should reach out to PCP, told her I'd let Dr Posey Pronto know and call her back.

## 2020-04-06 NOTE — Telephone Encounter (Signed)
Fax rec'd from [employer] noting they have attempted to contact patient several times for fill Tecfidera.  No response.  Please follow-up and see if patient is still taking this.  Thanks, DP

## 2020-04-08 ENCOUNTER — Emergency Department (HOSPITAL_COMMUNITY): Payer: Medicare Other

## 2020-04-08 ENCOUNTER — Inpatient Hospital Stay (HOSPITAL_COMMUNITY)
Admission: EM | Admit: 2020-04-08 | Discharge: 2020-04-13 | DRG: 177 | Disposition: A | Discharge status: Home / Self Care | Payer: Medicare Other | Attending: Student in an Organized Health Care Education/Training Program | Admitting: Student in an Organized Health Care Education/Training Program

## 2020-04-08 ENCOUNTER — Encounter (HOSPITAL_COMMUNITY): Payer: Self-pay | Admitting: Pediatrics

## 2020-04-08 ENCOUNTER — Other Ambulatory Visit: Payer: Self-pay

## 2020-04-08 DIAGNOSIS — D259 Leiomyoma of uterus, unspecified: Secondary | ICD-10-CM | POA: Diagnosis present

## 2020-04-08 DIAGNOSIS — Z820 Family history of epilepsy and other diseases of the nervous system: Secondary | ICD-10-CM

## 2020-04-08 DIAGNOSIS — G35 Multiple sclerosis: Secondary | ICD-10-CM | POA: Diagnosis present

## 2020-04-08 DIAGNOSIS — D751 Secondary polycythemia: Secondary | ICD-10-CM | POA: Diagnosis present

## 2020-04-08 DIAGNOSIS — N289 Disorder of kidney and ureter, unspecified: Secondary | ICD-10-CM

## 2020-04-08 DIAGNOSIS — U071 COVID-19: Secondary | ICD-10-CM | POA: Diagnosis not present

## 2020-04-08 DIAGNOSIS — F17210 Nicotine dependence, cigarettes, uncomplicated: Secondary | ICD-10-CM | POA: Diagnosis present

## 2020-04-08 DIAGNOSIS — I2510 Atherosclerotic heart disease of native coronary artery without angina pectoris: Secondary | ICD-10-CM | POA: Diagnosis present

## 2020-04-08 DIAGNOSIS — R202 Paresthesia of skin: Secondary | ICD-10-CM | POA: Diagnosis present

## 2020-04-08 DIAGNOSIS — D72819 Decreased white blood cell count, unspecified: Secondary | ICD-10-CM | POA: Diagnosis present

## 2020-04-08 DIAGNOSIS — Z91018 Allergy to other foods: Secondary | ICD-10-CM

## 2020-04-08 DIAGNOSIS — M797 Fibromyalgia: Secondary | ICD-10-CM | POA: Diagnosis present

## 2020-04-08 DIAGNOSIS — R0902 Hypoxemia: Secondary | ICD-10-CM

## 2020-04-08 DIAGNOSIS — R197 Diarrhea, unspecified: Secondary | ICD-10-CM

## 2020-04-08 DIAGNOSIS — N182 Chronic kidney disease, stage 2 (mild): Secondary | ICD-10-CM | POA: Diagnosis present

## 2020-04-08 DIAGNOSIS — M109 Gout, unspecified: Secondary | ICD-10-CM | POA: Diagnosis present

## 2020-04-08 DIAGNOSIS — N179 Acute kidney failure, unspecified: Secondary | ICD-10-CM | POA: Diagnosis present

## 2020-04-08 DIAGNOSIS — E559 Vitamin D deficiency, unspecified: Secondary | ICD-10-CM | POA: Diagnosis present

## 2020-04-08 DIAGNOSIS — Z8249 Family history of ischemic heart disease and other diseases of the circulatory system: Secondary | ICD-10-CM

## 2020-04-08 DIAGNOSIS — T50995A Adverse effect of other drugs, medicaments and biological substances, initial encounter: Secondary | ICD-10-CM | POA: Diagnosis present

## 2020-04-08 DIAGNOSIS — Z7902 Long term (current) use of antithrombotics/antiplatelets: Secondary | ICD-10-CM

## 2020-04-08 DIAGNOSIS — Z79899 Other long term (current) drug therapy: Secondary | ICD-10-CM

## 2020-04-08 DIAGNOSIS — J9601 Acute respiratory failure with hypoxia: Secondary | ICD-10-CM | POA: Diagnosis present

## 2020-04-08 DIAGNOSIS — I129 Hypertensive chronic kidney disease with stage 1 through stage 4 chronic kidney disease, or unspecified chronic kidney disease: Secondary | ICD-10-CM | POA: Diagnosis present

## 2020-04-08 DIAGNOSIS — Z7982 Long term (current) use of aspirin: Secondary | ICD-10-CM

## 2020-04-08 DIAGNOSIS — R7989 Other specified abnormal findings of blood chemistry: Secondary | ICD-10-CM

## 2020-04-08 DIAGNOSIS — E860 Dehydration: Secondary | ICD-10-CM | POA: Diagnosis present

## 2020-04-08 DIAGNOSIS — J1282 Pneumonia due to coronavirus disease 2019: Secondary | ICD-10-CM | POA: Diagnosis present

## 2020-04-08 DIAGNOSIS — E1122 Type 2 diabetes mellitus with diabetic chronic kidney disease: Secondary | ICD-10-CM | POA: Diagnosis present

## 2020-04-08 LAB — BASIC METABOLIC PANEL
Anion gap: 10 (ref 5–15)
BUN: 7 mg/dL (ref 6–20)
CO2: 25 mmol/L (ref 22–32)
Calcium: 9 mg/dL (ref 8.9–10.3)
Chloride: 101 mmol/L (ref 98–111)
Creatinine, Ser: 1.13 mg/dL — ABNORMAL HIGH (ref 0.44–1.00)
GFR calc Af Amer: >60 mL/min (ref >60)
GFR calc non Af Amer: 53 mL/min — ABNORMAL LOW (ref >60)
Glucose, Bld: 121 mg/dL — ABNORMAL HIGH (ref 70–99)
Potassium: 4 mmol/L (ref 3.5–5.1)
Sodium: 136 mmol/L (ref 135–145)

## 2020-04-08 LAB — CBC
HCT: 53 % — ABNORMAL HIGH (ref 36.0–46.0)
Hemoglobin: 17 g/dL — ABNORMAL HIGH (ref 12.0–15.0)
MCH: 29.4 pg (ref 26.0–34.0)
MCHC: 32.1 g/dL (ref 30.0–36.0)
MCV: 91.7 fL (ref 80.0–100.0)
Platelets: 160 10*3/uL (ref 150–400)
RBC: 5.78 MIL/uL — ABNORMAL HIGH (ref 3.87–5.11)
RDW: 13.7 % (ref 11.5–15.5)
WBC: 3.3 10*3/uL — ABNORMAL LOW (ref 4.0–10.5)
nRBC: 0 % (ref 0.0–0.2)

## 2020-04-08 LAB — I-STAT BETA HCG BLOOD, ED (MC, WL, AP ONLY): I-stat hCG, quantitative: <5 m[IU]/mL (ref <5)

## 2020-04-08 LAB — TROPONIN I (HIGH SENSITIVITY)
Troponin I (High Sensitivity): 14 ng/L (ref <18)
Troponin I (High Sensitivity): 13 ng/L (ref <18)

## 2020-04-08 MED ORDER — SODIUM CHLORIDE 0.9% FLUSH
3.0000 mL | Freq: Once | INTRAVENOUS | Status: AC
  Administered 2020-04-09: 3 mL via INTRAVENOUS

## 2020-04-08 NOTE — ED Triage Notes (Signed)
Patient also c/o chest pain and stated and has hx of heart stent.

## 2020-04-08 NOTE — ED Triage Notes (Signed)
Patient arrived via EMS; c/o NVD x 1 week. A&Ox 4

## 2020-04-09 ENCOUNTER — Emergency Department (HOSPITAL_COMMUNITY): Payer: Medicare Other

## 2020-04-09 ENCOUNTER — Other Ambulatory Visit: Payer: Self-pay

## 2020-04-09 DIAGNOSIS — N289 Disorder of kidney and ureter, unspecified: Secondary | ICD-10-CM | POA: Diagnosis present

## 2020-04-09 DIAGNOSIS — T50995A Adverse effect of other drugs, medicaments and biological substances, initial encounter: Secondary | ICD-10-CM | POA: Diagnosis present

## 2020-04-09 DIAGNOSIS — J9601 Acute respiratory failure with hypoxia: Secondary | ICD-10-CM | POA: Diagnosis present

## 2020-04-09 DIAGNOSIS — N182 Chronic kidney disease, stage 2 (mild): Secondary | ICD-10-CM | POA: Diagnosis present

## 2020-04-09 DIAGNOSIS — G35 Multiple sclerosis: Secondary | ICD-10-CM | POA: Diagnosis present

## 2020-04-09 DIAGNOSIS — Z91018 Allergy to other foods: Secondary | ICD-10-CM | POA: Diagnosis not present

## 2020-04-09 DIAGNOSIS — U071 COVID-19: Secondary | ICD-10-CM | POA: Diagnosis present

## 2020-04-09 DIAGNOSIS — I2510 Atherosclerotic heart disease of native coronary artery without angina pectoris: Secondary | ICD-10-CM | POA: Diagnosis present

## 2020-04-09 DIAGNOSIS — E860 Dehydration: Secondary | ICD-10-CM | POA: Diagnosis present

## 2020-04-09 DIAGNOSIS — Z7982 Long term (current) use of aspirin: Secondary | ICD-10-CM | POA: Diagnosis not present

## 2020-04-09 DIAGNOSIS — D72819 Decreased white blood cell count, unspecified: Secondary | ICD-10-CM | POA: Diagnosis present

## 2020-04-09 DIAGNOSIS — Z820 Family history of epilepsy and other diseases of the nervous system: Secondary | ICD-10-CM | POA: Diagnosis not present

## 2020-04-09 DIAGNOSIS — M109 Gout, unspecified: Secondary | ICD-10-CM | POA: Diagnosis present

## 2020-04-09 DIAGNOSIS — D259 Leiomyoma of uterus, unspecified: Secondary | ICD-10-CM | POA: Diagnosis present

## 2020-04-09 DIAGNOSIS — I129 Hypertensive chronic kidney disease with stage 1 through stage 4 chronic kidney disease, or unspecified chronic kidney disease: Secondary | ICD-10-CM | POA: Diagnosis present

## 2020-04-09 DIAGNOSIS — N179 Acute kidney failure, unspecified: Secondary | ICD-10-CM | POA: Diagnosis present

## 2020-04-09 DIAGNOSIS — M797 Fibromyalgia: Secondary | ICD-10-CM | POA: Diagnosis present

## 2020-04-09 DIAGNOSIS — Z79899 Other long term (current) drug therapy: Secondary | ICD-10-CM | POA: Diagnosis not present

## 2020-04-09 DIAGNOSIS — E1122 Type 2 diabetes mellitus with diabetic chronic kidney disease: Secondary | ICD-10-CM | POA: Diagnosis present

## 2020-04-09 DIAGNOSIS — Z7902 Long term (current) use of antithrombotics/antiplatelets: Secondary | ICD-10-CM | POA: Diagnosis not present

## 2020-04-09 DIAGNOSIS — J1282 Pneumonia due to coronavirus disease 2019: Secondary | ICD-10-CM | POA: Diagnosis present

## 2020-04-09 DIAGNOSIS — D751 Secondary polycythemia: Secondary | ICD-10-CM | POA: Diagnosis present

## 2020-04-09 DIAGNOSIS — E559 Vitamin D deficiency, unspecified: Secondary | ICD-10-CM | POA: Diagnosis present

## 2020-04-09 DIAGNOSIS — R202 Paresthesia of skin: Secondary | ICD-10-CM | POA: Diagnosis present

## 2020-04-09 DIAGNOSIS — F17210 Nicotine dependence, cigarettes, uncomplicated: Secondary | ICD-10-CM | POA: Diagnosis present

## 2020-04-09 LAB — CBC WITH DIFFERENTIAL/PLATELET
Abs Immature Granulocytes: 0 10*3/uL (ref 0.00–0.07)
Basophils Absolute: 0 10*3/uL (ref 0.0–0.1)
Basophils Relative: 0 %
Eosinophils Absolute: 0 10*3/uL (ref 0.0–0.5)
Eosinophils Relative: 0 %
HCT: 48.5 % — ABNORMAL HIGH (ref 36.0–46.0)
Hemoglobin: 15.8 g/dL — ABNORMAL HIGH (ref 12.0–15.0)
Lymphocytes Relative: 21 %
Lymphs Abs: 0.7 10*3/uL (ref 0.7–4.0)
MCH: 30.1 pg (ref 26.0–34.0)
MCHC: 32.6 g/dL (ref 30.0–36.0)
MCV: 92.4 fL (ref 80.0–100.0)
Monocytes Absolute: 0.2 10*3/uL (ref 0.1–1.0)
Monocytes Relative: 7 %
Neutro Abs: 2.5 10*3/uL (ref 1.7–7.7)
Neutrophils Relative %: 72 %
Platelets: 154 10*3/uL (ref 150–400)
RBC: 5.25 MIL/uL — ABNORMAL HIGH (ref 3.87–5.11)
RDW: 13.8 % (ref 11.5–15.5)
WBC: 3.5 10*3/uL — ABNORMAL LOW (ref 4.0–10.5)
nRBC: 0 % (ref 0.0–0.2)
nRBC: 0 /100 WBC

## 2020-04-09 LAB — HEPATIC FUNCTION PANEL
ALT: 30 U/L (ref 0–44)
AST: 44 U/L — ABNORMAL HIGH (ref 15–41)
Albumin: 3.2 g/dL — ABNORMAL LOW (ref 3.5–5.0)
Alkaline Phosphatase: 69 U/L (ref 38–126)
Bilirubin, Direct: 0.3 mg/dL — ABNORMAL HIGH (ref 0.0–0.2)
Indirect Bilirubin: 1.1 mg/dL — ABNORMAL HIGH (ref 0.3–0.9)
Total Bilirubin: 1.4 mg/dL — ABNORMAL HIGH (ref 0.3–1.2)
Total Protein: 7.4 g/dL (ref 6.5–8.1)

## 2020-04-09 LAB — BASIC METABOLIC PANEL
Anion gap: 9 (ref 5–15)
BUN: 6 mg/dL (ref 6–20)
CO2: 24 mmol/L (ref 22–32)
Calcium: 8.2 mg/dL — ABNORMAL LOW (ref 8.9–10.3)
Chloride: 102 mmol/L (ref 98–111)
Creatinine, Ser: 1.22 mg/dL — ABNORMAL HIGH (ref 0.44–1.00)
GFR calc Af Amer: 56 mL/min — ABNORMAL LOW (ref >60)
GFR calc non Af Amer: 48 mL/min — ABNORMAL LOW (ref >60)
Glucose, Bld: 114 mg/dL — ABNORMAL HIGH (ref 70–99)
Potassium: 3.8 mmol/L (ref 3.5–5.1)
Sodium: 135 mmol/L (ref 135–145)

## 2020-04-09 LAB — TROPONIN I (HIGH SENSITIVITY)
Troponin I (High Sensitivity): 19 ng/L — ABNORMAL HIGH (ref <18)
Troponin I (High Sensitivity): 18 ng/L — ABNORMAL HIGH (ref <18)

## 2020-04-09 LAB — I-STAT ARTERIAL BLOOD GAS, ED
Acid-base deficit: 3 mmol/L — ABNORMAL HIGH (ref 0.0–2.0)
Bicarbonate: 21.3 mmol/L (ref 20.0–28.0)
Calcium, Ion: 1.08 mmol/L — ABNORMAL LOW (ref 1.15–1.40)
HCT: 47 % — ABNORMAL HIGH (ref 36.0–46.0)
Hemoglobin: 16 g/dL — ABNORMAL HIGH (ref 12.0–15.0)
O2 Saturation: 93 %
Patient temperature: 99.7
Potassium: 3.6 mmol/L (ref 3.5–5.1)
Sodium: 134 mmol/L — ABNORMAL LOW (ref 135–145)
TCO2: 22 mmol/L (ref 22–32)
pCO2 arterial: 36.1 mmHg (ref 32.0–48.0)
pH, Arterial: 7.381 (ref 7.350–7.450)
pO2, Arterial: 68 mmHg — ABNORMAL LOW (ref 83.0–108.0)

## 2020-04-09 LAB — CBG MONITORING, ED
Glucose-Capillary: 164 mg/dL — ABNORMAL HIGH (ref 70–99)
Glucose-Capillary: 132 mg/dL — ABNORMAL HIGH (ref 70–99)
Glucose-Capillary: 170 mg/dL — ABNORMAL HIGH (ref 70–99)

## 2020-04-09 LAB — FIBRINOGEN: Fibrinogen: 549 mg/dL — ABNORMAL HIGH (ref 210–475)

## 2020-04-09 LAB — C-REACTIVE PROTEIN: CRP: 7.4 mg/dL — ABNORMAL HIGH (ref <1.0)

## 2020-04-09 LAB — HEMOGLOBIN A1C
Hgb A1c MFr Bld: 7 % — ABNORMAL HIGH (ref 4.8–5.6)
Mean Plasma Glucose: 154.2 mg/dL

## 2020-04-09 LAB — HIV ANTIBODY (ROUTINE TESTING W REFLEX): HIV Screen 4th Generation wRfx: NONREACTIVE

## 2020-04-09 LAB — FERRITIN: Ferritin: 353 ng/mL — ABNORMAL HIGH (ref 11–307)

## 2020-04-09 LAB — BRAIN NATRIURETIC PEPTIDE: B Natriuretic Peptide: 33.5 pg/mL (ref 0.0–100.0)

## 2020-04-09 LAB — LIPASE, BLOOD: Lipase: 18 U/L (ref 11–51)

## 2020-04-09 LAB — LACTATE DEHYDROGENASE: LDH: 271 U/L — ABNORMAL HIGH (ref 98–192)

## 2020-04-09 LAB — SARS CORONAVIRUS 2 BY RT PCR (HOSPITAL ORDER, PERFORMED IN ~~LOC~~ HOSPITAL LAB): SARS Coronavirus 2: POSITIVE — AB

## 2020-04-09 LAB — D-DIMER, QUANTITATIVE: D-Dimer, Quant: 2.47 ug/mL-FEU — ABNORMAL HIGH (ref 0.00–0.50)

## 2020-04-09 MED ORDER — LACTATED RINGERS IV SOLN: INTRAVENOUS | Status: AC

## 2020-04-09 MED ORDER — ONDANSETRON HCL 4 MG/2ML IJ SOLN
4.0000 mg | Freq: Once | INTRAMUSCULAR | Status: AC
  Administered 2020-04-09: 4 mg via INTRAVENOUS
  Filled 2020-04-09: qty 2

## 2020-04-09 MED ORDER — METOPROLOL TARTRATE 50 MG PO TABS
50.0000 mg | ORAL_TABLET | Freq: Two times a day (BID) | ORAL | Status: DC
  Administered 2020-04-09 – 2020-04-12 (×6): 50 mg via ORAL
  Filled 2020-04-09: qty 2
  Filled 2020-04-09 (×2): qty 1
  Filled 2020-04-09: qty 2
  Filled 2020-04-09: qty 1
  Filled 2020-04-09: qty 2
  Filled 2020-04-09 (×2): qty 1

## 2020-04-09 MED ORDER — LOPERAMIDE HCL 2 MG PO CAPS
4.0000 mg | ORAL_CAPSULE | Freq: Once | ORAL | Status: AC
  Administered 2020-04-09: 4 mg via ORAL
  Filled 2020-04-09: qty 2

## 2020-04-09 MED ORDER — RIVAROXABAN 10 MG PO TABS
10.0000 mg | ORAL_TABLET | Freq: Every day | ORAL | Status: DC
  Administered 2020-04-09 – 2020-04-13 (×5): 10 mg via ORAL
  Filled 2020-04-09 (×6): qty 1

## 2020-04-09 MED ORDER — DEXAMETHASONE SODIUM PHOSPHATE 10 MG/ML IJ SOLN
6.0000 mg | Freq: Every day | INTRAMUSCULAR | Status: DC
  Administered 2020-04-09 – 2020-04-13 (×5): 6 mg via INTRAVENOUS
  Filled 2020-04-09 (×5): qty 1

## 2020-04-09 MED ORDER — SODIUM CHLORIDE 0.9 % IV SOLN
200.0000 mg | Freq: Once | INTRAVENOUS | Status: AC
  Administered 2020-04-09: 200 mg via INTRAVENOUS
  Filled 2020-04-09: qty 40

## 2020-04-09 MED ORDER — INSULIN ASPART 100 UNIT/ML ~~LOC~~ SOLN: 0.0000 [IU] | Freq: Every day | SUBCUTANEOUS | Status: DC

## 2020-04-09 MED ORDER — MORPHINE SULFATE (PF) 4 MG/ML IV SOLN
4.0000 mg | Freq: Once | INTRAVENOUS | Status: AC
  Administered 2020-04-09: 4 mg via INTRAVENOUS
  Filled 2020-04-09: qty 1

## 2020-04-09 MED ORDER — RIVAROXABAN 10 MG PO TABS: 10.0000 mg | ORAL_TABLET | Freq: Two times a day (BID) | ORAL | Status: DC

## 2020-04-09 MED ORDER — BENZONATATE 100 MG PO CAPS
200.0000 mg | ORAL_CAPSULE | Freq: Two times a day (BID) | ORAL | Status: DC
  Administered 2020-04-09 – 2020-04-13 (×9): 200 mg via ORAL
  Filled 2020-04-09 (×9): qty 2

## 2020-04-09 MED ORDER — INSULIN ASPART 100 UNIT/ML ~~LOC~~ SOLN
0.0000 [IU] | Freq: Three times a day (TID) | SUBCUTANEOUS | Status: DC
  Administered 2020-04-11 (×3): 1 [IU] via SUBCUTANEOUS
  Administered 2020-04-12: 2 [IU] via SUBCUTANEOUS
  Administered 2020-04-13 (×2): 1 [IU] via SUBCUTANEOUS

## 2020-04-09 MED ORDER — DIMETHYL FUMARATE 240 MG PO CPDR: 240.0000 mg | DELAYED_RELEASE_CAPSULE | Freq: Two times a day (BID) | ORAL | Status: DC

## 2020-04-09 MED ORDER — SODIUM CHLORIDE 0.9 % IV SOLN
100.0000 mg | INTRAVENOUS | Status: AC
  Administered 2020-04-10 – 2020-04-13 (×4): 100 mg via INTRAVENOUS
  Filled 2020-04-09 (×4): qty 20

## 2020-04-09 MED ORDER — GUAIFENESIN-DM 100-10 MG/5ML PO SYRP
5.0000 mL | ORAL_SOLUTION | Freq: Four times a day (QID) | ORAL | Status: DC | PRN
  Administered 2020-04-10 – 2020-04-12 (×4): 5 mL via ORAL
  Filled 2020-04-09 (×4): qty 5

## 2020-04-09 MED ORDER — EZETIMIBE 10 MG PO TABS
10.0000 mg | ORAL_TABLET | Freq: Every day | ORAL | Status: DC
  Administered 2020-04-11 – 2020-04-13 (×3): 10 mg via ORAL
  Filled 2020-04-09 (×5): qty 1

## 2020-04-09 MED ORDER — ACETAMINOPHEN 325 MG PO TABS: 650.0000 mg | ORAL_TABLET | Freq: Four times a day (QID) | ORAL | Status: DC | PRN

## 2020-04-09 MED ORDER — SENNOSIDES-DOCUSATE SODIUM 8.6-50 MG PO TABS: 1.0000 | ORAL_TABLET | Freq: Every evening | ORAL | Status: DC | PRN

## 2020-04-09 MED ORDER — RIVAROXABAN 15 MG PO TABS
15.0000 mg | ORAL_TABLET | Freq: Two times a day (BID) | ORAL | Status: DC
  Filled 2020-04-09: qty 1

## 2020-04-09 MED ORDER — IOHEXOL 300 MG/ML  SOLN
125.0000 mL | Freq: Once | INTRAMUSCULAR | Status: AC | PRN
  Administered 2020-04-09: 125 mL via INTRAVENOUS

## 2020-04-09 MED ORDER — CLOPIDOGREL BISULFATE 75 MG PO TABS
75.0000 mg | ORAL_TABLET | Freq: Every day | ORAL | Status: DC
  Administered 2020-04-09 – 2020-04-13 (×5): 75 mg via ORAL
  Filled 2020-04-09 (×5): qty 1

## 2020-04-09 MED ORDER — ONDANSETRON HCL 4 MG/2ML IJ SOLN: 4.0000 mg | Freq: Four times a day (QID) | INTRAMUSCULAR | Status: DC | PRN

## 2020-04-09 MED ORDER — DICLOFENAC SODIUM 1 % TD GEL
4.0000 g | Freq: Four times a day (QID) | TRANSDERMAL | Status: DC | PRN
  Filled 2020-04-09: qty 100

## 2020-04-09 MED ORDER — ASPIRIN EC 81 MG PO TBEC
81.0000 mg | DELAYED_RELEASE_TABLET | Freq: Every day | ORAL | Status: DC
  Administered 2020-04-09 – 2020-04-13 (×5): 81 mg via ORAL
  Filled 2020-04-09 (×5): qty 1

## 2020-04-09 MED ORDER — ACETAMINOPHEN 650 MG RE SUPP: 650.0000 mg | Freq: Four times a day (QID) | RECTAL | Status: DC | PRN

## 2020-04-09 MED ORDER — ONDANSETRON HCL 4 MG PO TABS: 4.0000 mg | ORAL_TABLET | Freq: Four times a day (QID) | ORAL | Status: DC | PRN

## 2020-04-09 MED ORDER — VARENICLINE TARTRATE 1 MG PO TABS
1.0000 mg | ORAL_TABLET | Freq: Two times a day (BID) | ORAL | Status: DC
  Filled 2020-04-09 (×9): qty 1

## 2020-04-09 MED ORDER — SODIUM CHLORIDE 0.9 % IV BOLUS
1000.0000 mL | Freq: Once | INTRAVENOUS | Status: AC
  Administered 2020-04-09: 1000 mL via INTRAVENOUS

## 2020-04-09 MED ORDER — DULOXETINE HCL 30 MG PO CPEP
30.0000 mg | ORAL_CAPSULE | Freq: Every day | ORAL | Status: DC
  Administered 2020-04-09 – 2020-04-13 (×6): 30 mg via ORAL
  Filled 2020-04-09 (×7): qty 1

## 2020-04-09 NOTE — ED Notes (Signed)
Per pharmacy, medication dimethyl fumarate not available.

## 2020-04-09 NOTE — ED Provider Notes (Signed)
[facility] EMERGENCY DEPARTMENT Provider Note   CSN: [id number] Arrival date & time: 04/08/20  1655   History Chief Complaint  Patient presents with   Nausea   Emesis   Diarrhea   Chest Pain    Tiffany Brady is a 59 y.o. female.  The history is provided by the patient.  Emesis Associated symptoms: diarrhea   Diarrhea Associated symptoms: vomiting   Chest Pain Associated symptoms: vomiting   She has history of hypertension, chronic kidney disease, coronary artery disease, multiple sclerosis and comes in because of diarrhea over the last 4-5 days.  She has had multiple episodes of diarrhea through the day.  This is associated with crampy upper abdominal pain with some radiation to the left side of the chest.  Pain is rated at 7/10.  She has had some nausea and spits up a small amount but has not no true vomiting.  Appetite has been diminished.  She denies fever or chills.  There has been no blood in her stool or emesis.  She has not had any known sick contacts.  She did travel to [place] several weeks ago.  There has been no treatment at home.  Past Medical History:  Diagnosis Date   Chronic kidney disease    pt reported kidney failure   Chronic pain syndrome    Gout    Hypertension    MS (multiple sclerosis) ([place])    Tobacco use     Patient Active Problem List   Diagnosis Date Noted   Postmenopausal bleeding 08/12/2019   Alopecia 03/29/2019   Subcutaneous cyst 03/29/2019   Snoring 12/10/2018   NSTEMI (non-ST elevated myocardial infarction) ([place]) 08/21/2018   De Quervain's disease (tenosynovitis) 08/02/2018   Body mass index 40.0-44.9, adult ([place]) 08/02/2018   Asymmetrical hearing loss of both ears 01/03/2018   Stress incontinence 05/08/2017   Cervical radiculopathy 05/08/2017   CKD (chronic kidney disease) stage 3, GFR 30-59 ml/min 04/04/2017   Vitamin D deficiency 04/04/2017   Diabetes mellitus type II, controlled ([place])  04/04/2017   Fibromyalgia 03/28/2016   Multiple sclerosis ([place]) 11/02/2015   Tobacco abuse 04/24/2014   Back muscle spasm 01/28/2014   Migraine headache 07/23/2013   Essential hypertension 06/27/2013   Chronic pain syndrome 06/27/2013   Morbid obesity ([place]) 06/27/2013    Past Surgical History:  Procedure Laterality Date   FOOT SURGERY Right    LEFT HEART CATH AND CORONARY ANGIOGRAPHY N/A 08/22/2018   Procedure: LEFT HEART CATH AND CORONARY ANGIOGRAPHY;  Surgeon: Sherren Mocha, MD;  Location: [facility];  Service: Cardiovascular;  Laterality: N/A;     OB History    Gravida  2   Para  1   Term      Preterm      AB  1   Living  1     SAB  1   TAB      Ectopic      Multiple      Live Births              Family History  Problem Relation Age of Onset   Cancer Mother        Deceased-Stomach   Hypertension Mother    Other Father        Deceased, 52   HIV/AIDS Brother        Deceased   Multiple sclerosis Daughter    Breast cancer Neg Hx     Social History   Tobacco  Use   Smoking status: Current Every Day Smoker    Packs/day: 0.75    Years: 40.00    Pack years: 30.00    Types: Cigarettes   Smokeless tobacco: Never Used  Vaping Use   Vaping Use: Never used  Substance Use Topics   Alcohol use: Yes    Comment: occasional   Drug use: No    Home Medications Prior to Admission medications   Medication Sig Start Date End Date Taking? Authorizing Provider  Accu-Chek Softclix Lancets lancets AS DIRECTED DAILY 04/01/19   Ladell Pier, MD  AMBULATORY NON FORMULARY MEDICATION 4-wheeled rollator 10/11/19   Narda Amber K, DO  amLODipine (NORVASC) 5 MG tablet TAKE ONE TABLET BY MOUTH DAILY 01/07/20   Ladell Pier, MD  aspirin EC 81 MG tablet Take 1 tablet (81 mg total) by mouth daily. 04/04/17   Ladell Pier, MD  atorvastatin (LIPITOR) 80 MG tablet TAKE ONE TABLET BY MOUTH DAILY AT [facility] 12/03/19   Belva Crome,  MD  Blood Glucose Monitoring Suppl (TRUE METRIX GO GLUCOSE METER) w/Device KIT 1 each by Does not apply route every 8 (eight) hours as needed. 03/28/16   Maren Reamer, MD  CHANTIX CONTINUING MONTH PAK 1 MG tablet TAKE ONE TABLET BY MOUTH TWICE A DAY 03/25/20   Ladell Pier, MD  cholecalciferol (VITAMIN D3) 25 MCG (1000 UT) tablet Take 5,000 Units by mouth daily.    [provider]  clopidogrel (PLAVIX) 75 MG tablet TAKE ONE TABLET BY MOUTH DAILY 01/29/20   Belva Crome, MD  cyclobenzaprine (FLEXERIL) 10 MG tablet TAKE 1 TABLET (10 MG TOTAL) BY MOUTH TWO (TWO) TIMES DAILY AS NEEDED FOR MUSCLE SPASMS. 12/24/19   Ladell Pier, MD  diclofenac sodium (VOLTAREN) 1 % GEL APPLY TWO GRAMS TOPICALLY FOUR TIMES A DAY 06/10/19   Ladell Pier, MD  Dimethyl Fumarate (TECFIDERA) 240 MG CPDR Take 1 capsule (240 mg total) by mouth 2 (two) times daily. 03/12/19   Patel, Arvin Collard K, DO  DULoxetine (CYMBALTA) 30 MG capsule Take 1 capsule (30 mg total) by mouth daily. Must have office visit for refills 07/16/19   Ladell Pier, MD  ezetimibe (ZETIA) 10 MG tablet TAKE ONE TABLET BY MOUTH DAILY 12/03/19   Belva Crome, MD  fluticasone [facility]) 50 MCG/ACT nasal spray PLACE ONE SPRAY INTO BOTH NOSTRILS DAILY AS NEEDED FOR ALLERGIES OR RHINITIS 06/10/19   Ladell Pier, MD  glucose blood (TRUE METRIX BLOOD GLUCOSE TEST) test strip Check blood sugar once a day 12/28/18   Ladell Pier, MD  losartan (COZAAR) 25 MG tablet Take 1 tablet (25 mg total) by mouth daily. Must have office visit for refills 07/16/19   Ladell Pier, MD  metoprolol tartrate (LOPRESSOR) 50 MG tablet Take 1 tablet (50 mg total) by mouth 2 (two) times daily. 10/09/19 01/07/20  Burtis Junes, NP  nitroGLYCERIN (NITROSTAT) 0.4 MG SL tablet Place 1 tablet (0.4 mg total) under the tongue every 5 (five) minutes x 3 doses as needed for chest pain. 08/23/18   Bhagat, Crista Luria, PA  pregabalin (LYRICA) 25 MG capsule  Take 1 capsule (25 mg total) by mouth 2 (two) times daily. 07/23/18   Ladell Pier, MD  traMADol (ULTRAM) 50 MG tablet Take 1 tablet (50 mg total) by mouth every 12 (twelve) hours as needed. 03/05/20   Fransico Meadow, PA-C    Allergies    Pork-derived products  Review of Systems  Review of Systems  Cardiovascular: Positive for chest pain.  Gastrointestinal: Positive for diarrhea and vomiting.  All other systems reviewed and are negative.   Physical Exam Updated Vital Signs BP (!) 154/87 (BP Location: Right Arm)    Pulse 102    Temp 99.7 F (37.6 C) (Oral)    Resp 16    Ht 5' 4" (1.626 m)    Wt (!) 117.9 kg    LMP 04/21/2014    SpO2 93%    BMI 44.63 kg/m   Physical Exam Vitals and nursing note reviewed.   59 year old female, resting comfortably and in no acute distress. Vital signs are significant for mildly elevated blood pressure. Oxygen saturation is 93%, which is normal. Head is normocephalic and atraumatic. PERRLA, EOMI. Oropharynx is clear. Neck is nontender and supple without adenopathy or JVD. Back is nontender and there is no CVA tenderness. Lungs are clear without rales, wheezes, or rhonchi. Chest is nontender. Heart has regular rate and rhythm without murmur. Abdomen is soft, flat, with mild to moderate tenderness across the upper abdomen.  There is no rebound or guarding.  There are no masses or hepatosplenomegaly and peristalsis is hypoactive. Extremities have no cyanosis or edema, full range of motion is present. Skin is warm and dry without rash. Neurologic: Mental status is normal, cranial nerves are intact.  She moves all extremities equally.  ED Results / Procedures / Treatments   Labs (all labs ordered are listed, but only abnormal results are displayed) Labs Reviewed  BASIC METABOLIC PANEL - Abnormal; Notable for the following components:      Result Value   Glucose, Bld 121 (*)    Creatinine, Ser 1.13 (*)    GFR calc non Af Amer 53 (*)    All  other components within normal limits  CBC - Abnormal; Notable for the following components:   WBC 3.3 (*)    RBC 5.78 (*)    Hemoglobin 17.0 (*)    HCT 53.0 (*)    All other components within normal limits  I-STAT BETA HCG BLOOD, ED ([facility], [facility], [facility] ONLY)  TROPONIN I (HIGH SENSITIVITY)  TROPONIN I (HIGH SENSITIVITY)    EKG EKG Interpretation  Date/Time:  Wednesday April 08 2020 17:06:01 EDT Ventricular Rate:  93 PR Interval:  148 QRS Duration: 66 QT Interval:  358 QTC Calculation: 445 R Axis:   39 Text Interpretation: Normal sinus rhythm Biatrial enlargement Low voltage QRS ST & T wave abnormality, consider lateral ischemia Abnormal ECG When compared with ECG of 08/23/2018, T wave abnormality Anterolateral leads is now present Confirmed by Delora Fuel ([id number]) on 04/08/2020 11:53:00 PM   Radiology DG Chest 2 View  Result Date: 04/08/2020 CLINICAL DATA:  59 year old female with chest pain. EXAM: CHEST - 2 VIEW COMPARISON:  Chest radiograph dated 08/21/2018. FINDINGS: There is mild vascular congestion. There is background of chronic bronchitic changes. No focal consolidation, pleural effusion or pneumothorax. The cardiac silhouette is within limits. No acute osseous pathology. IMPRESSION: 1. No focal consolidation. 2. Mild central vascular congestion. Electronically Signed   By: Anner Crete M.D.   On: 04/08/2020 19:49    Procedures Procedures   Medications Ordered in ED Medications  sodium chloride flush (NS) 0.9 % injection 3 mL (has no administration in time range)    ED Course  I have reviewed the triage vital signs and the nursing notes.  Pertinent labs & imaging results that were available during my care of the patient were reviewed  by me and considered in my medical decision making (see chart for details).  MDM Rules/Calculators/A&P Diarrhea of uncertain cause.  Pattern is not typical of viral enteritis.  Labs show no evidence of dehydration.  Leukopenia is noted.   Because of pain going into the chest, chest x-ray and ECG were obtained as well as troponin.  There are some minor T wave changes noted on ECG but troponin is normal x2.  Chest x-ray is reported to show mild central vascular congestion but with normal heart size.  Will give IV fluids, oral loperamide, and send for CT of abdomen and pelvis.  Old records are reviewed, and she had coronary stent placed in 2019 and only had mild disease in nonstented vessels.  6:02 AM Hepatic function panel was unremarkable.  CT of abdomen and pelvis showed no acute process.  She feels significantly better after above-noted treatment.  However, she is noted to be significantly hypoxic with oxygen saturation of 68% on room air.  She is placed on 4 L of oxygen via nasal cannula, and oxygen has come up to 90-91%.  Lungs are clear, no evidence of rales or fluid overload.  Cause for hypoxia is not clear.  Will check D-dimer, BNP, arterial blood gas.  8:01 AM D-dimer is significantly elevated, BNP is normal.  ABG does confirm hypoxia (PO2 68 on 4 L oxygen via nasal cannula).  Strong suspicion for that she has pulmonary embolism, but I am concerned about her getting a CT angiogram which would be a second load of IV dye.  She will need to be admitted and decision made whether to proceed with second IV bolus for CT angiogram versus proceeding with VQ scan.  Case is discussed with Dr. Noelle Penner of internal medicine teaching service, who agrees to admit the patient.  Final Clinical Impression(s) / ED Diagnoses Final diagnoses:  Hypoxia  Diarrhea, unspecified type  Elevated d-dimer  Renal insufficiency    Rx / DC Orders ED Discharge Orders    None       Delora Fuel, MD [birth year] [phone]

## 2020-04-09 NOTE — ED Provider Notes (Signed)
  Provider Note MRN:  [id number]  Arrival date & time: 04/09/20    ED Course and Medical Decision Making  Assumed care from Dr. Roxanne Mins at shift change.  History of MS, presenting with nausea, vomiting, diarrhea, work-up initially reassuring the patient developed hypoxia in the 60s on room air, requiring 4 to 6 L nasal cannula, D-dimer is elevated.  Has already received CT abdomen with contrast, will admit to hospitalist service to determine need for repeat CT imaging versus VQ scan versus empiric treatment for PE.  Accepted for admission by hospitalist service for further care.  Procedures  Final Clinical Impressions(s) / ED Diagnoses     ICD-10-CM   1. Hypoxia  R09.02   2. Diarrhea, unspecified type  R19.7   3. Elevated d-dimer  R79.89     ED Discharge Orders    None      Discharge Instructions   None     Barth Kirks. Sedonia Small, [place] [email or web address]    Maudie Flakes, MD 04/09/20 [phone]

## 2020-04-09 NOTE — ED Notes (Signed)
Lunch Tray Ordered @ 1008. 

## 2020-04-09 NOTE — ED Notes (Signed)
Ordered hospital bed for patient 

## 2020-04-09 NOTE — ED Notes (Signed)
Patient spo2 in 80's, denies SOB, refused oxygen

## 2020-04-09 NOTE — H&P (Signed)
Date: 04/09/2020               Patient Name:  Tiffany Brady MRN: [id number]  DOB: [birth year] Age / Sex: 59 y.o., female   PCP: Ladell Pier, MD         Medical Service: Internal Medicine Teaching Service         Attending Physician: Dr. Velna Ochs, MD    First Contact: Dr. Kai Levins Pager: [phone]  Second Contact: Dr. Sherry Ruffing Pager: [phone]       After Hours (After 5p/  First Contact Pager: [phone]  weekends / holidays): Second Contact Pager: [phone]   Chief Complaint: vomiting  History of Present Illness:   Tiffany Brady is a 59yo female with PMH MS, tobacco use, HTN, gout, and CKD stage II presenting with diarrhea and decreased appetite for the past four days with request for admission due to sudden on set hypoxemia while in the ER. This became much worse two days ago and she has been having diarrhea every 15 minutes which has prevented her from sleeping. She denies recent nausea, abdominal pain, blood in stool, black stools, or change in urination.  Work-up was done for this in the ER but CT abdomen with contrast findings showed only hepatic steatosis and bilateral lower lobe atelectasis. At this time she suddenly had a decrease in her O2 sats to the 60s, requiring non-rebreather, then weaned down to 6L [place]. She denies recent shortness of breath, chest pain, pleuritic pain, palpitations or recent sick contacts. She does endorse slight cough since she arrived to the hospital but does not remember having one before. She did recently go visit her daughter in [place], who is on hospice for MS as well.   As for her MS, at baseline her RLE is slightly weaker than the left. She has had increased pain and cramping in her arms and legs and hands and was supposed to follow-up with her PCP to determine if she needed to see her neurologist for this or if it was her fibromyalgia but has not yet been able to do so.   Social:   She smokes 1/4 ppd since she was 59 years old  She  drinks socially  She does not use drugs recreationally   Family History:   Family History  Problem Relation Age of Onset  . Cancer Mother        Deceased-Stomach  . Hypertension Mother   . Other Father        Deceased, 78  . HIV/AIDS Brother        Deceased  . Multiple sclerosis Daughter   . Breast cancer Neg Hx      Meds:  Current Meds  Medication Sig  . amLODipine (NORVASC) 5 MG tablet TAKE ONE TABLET BY MOUTH DAILY (Patient taking differently: Take 5 mg by mouth daily. )  . aspirin EC 81 MG tablet Take 1 tablet (81 mg total) by mouth daily.  Marland Kitchen atorvastatin (LIPITOR) 80 MG tablet TAKE ONE TABLET BY MOUTH DAILY AT 6PM (Patient taking differently: Take 80 mg by mouth every evening. )  . CHANTIX CONTINUING MONTH PAK 1 MG tablet TAKE ONE TABLET BY MOUTH TWICE A DAY (Patient taking differently: Take 1 mg by mouth 2 (two) times daily. )  . cholecalciferol (VITAMIN D3) 25 MCG (1000 UT) tablet Take 5,000 Units by mouth daily.  . clopidogrel (PLAVIX) 75 MG tablet TAKE ONE TABLET BY MOUTH DAILY (Patient taking differently: Take 75 mg by mouth  daily. )  . cyclobenzaprine (FLEXERIL) 10 MG tablet TAKE 1 TABLET (10 MG TOTAL) BY MOUTH TWO (TWO) TIMES DAILY AS NEEDED FOR MUSCLE SPASMS. (Patient taking differently: Take 10 mg by mouth 2 (two) times daily as needed for muscle spasms. )  . diclofenac sodium (VOLTAREN) 1 % GEL APPLY TWO GRAMS TOPICALLY FOUR TIMES A DAY (Patient taking differently: Apply 2 g topically 4 (four) times daily as needed (pain). )  . Dimethyl Fumarate (TECFIDERA) 240 MG CPDR Take 1 capsule (240 mg total) by mouth 2 (two) times daily.  . DULoxetine (CYMBALTA) 30 MG capsule Take 1 capsule (30 mg total) by mouth daily. Must have office visit for refills  . ezetimibe (ZETIA) 10 MG tablet TAKE ONE TABLET BY MOUTH DAILY (Patient taking differently: Take 10 mg by mouth daily. )  . fluticasone (FLONASE) 50 MCG/ACT nasal spray PLACE ONE SPRAY INTO BOTH NOSTRILS DAILY AS NEEDED FOR  ALLERGIES OR RHINITIS (Patient taking differently: Place 1 spray into both nostrils daily as needed for allergies. )  . metoprolol tartrate (LOPRESSOR) 50 MG tablet Take 1 tablet (50 mg total) by mouth 2 (two) times daily.  . nitroGLYCERIN (NITROSTAT) 0.4 MG SL tablet Place 1 tablet (0.4 mg total) under the tongue every 5 (five) minutes x 3 doses as needed for chest pain.  . traMADol (ULTRAM) 50 MG tablet Take 1 tablet (50 mg total) by mouth every 12 (twelve) hours as needed. (Patient taking differently: Take 50 mg by mouth every 12 (twelve) hours as needed for moderate pain. )     Allergies: Allergies as of 04/08/2020 - Review Complete 04/08/2020  Allergen Reaction Noted  . Pork-derived products Hives 02/01/2016   Past Medical History:  Diagnosis Date  . Chronic kidney disease    pt reported kidney failure  . Chronic pain syndrome   . Gout   . Hypertension   . MS (multiple sclerosis) ([place])   . Tobacco use      Review of Systems: A complete ROS was negative except as per HPI.   Physical Exam: Blood pressure (!) 168/94, pulse 98, temperature 99.7 F (37.6 C), temperature source Oral, resp. rate 23, height 5\' 4"  (1.626 m), weight (!) 117.9 kg, last menstrual period 04/21/2014, SpO2 92 %.  Constitution: NAD, sitting up in bed HENT: [place]/AT Eyes: no icterus or injection Cardio: tachycardic, regular rhythm, no m/r/g, no JVP or LE edema Respiratory: decreased bibasilar breath sounds, no w/r/r, [place] in place Abdominal: NTTP, soft, non-distended, +BS Neuro: a&o, normal affect Skin: c/d/i    EKG: personally reviewed my interpretation is lateral t-wave inversions, chronic: biatrial enlargement, normal sinus rhythm.   CXR: personally reviewed my interpretation is bilateral lower lobe interstitial opacity  Assessment & Plan by Problem: Active Problems:   Acute hypoxemic respiratory failure (HCC)  Tiffany Brady is a 59yo female with PMH MS, tobacco use, HTN, gout, and CKD stage II  presenting with diarrhea and decreased appetite for the past four days with request for admission due to sudden on set hypoxemia while in the ER, found to be covid-19 positive.   Acute Hypoxemic Respiratory Failure 2/2 Covid-19 Patient developed diarrhea and decreased appetite about four days ago. While in the ER she began to develop hypoxemia but was asymptomatic except for dry cough that started yesterday. She has not yet had the vaccination and covid test was positive. There was some initial concern for PE with recent travel 3 weeks ago of around ~28 hours but symptoms and elevated d-dimer are  much more likely secondary to her covid-19. Lead III q waves are chronic and present on ECG in Jan.   - dexamethasone 6 mg 10 days - remdesivir  - tessalon perles 200 mg bid  - guaifenasin-dextromethorphan 97mL q6h PRN cough  - add on ferritin, fibrinogen, CRP, LDH - repeat ECG  - maintenance fluids   Lateral T wave changes  No chest pain, nausea. She does have bilateral tingling in her hands. Troponins 14>13>19. BNP normal   - repeat ECG tomorrow AM    AKI on CKD Stage II Likely secondary to dehydration. She has had very little to eat or drink in the past few days. Dry mucous membranes, no LE edema and has not had much to eat drink since arriving to the ER. S/p 1L this am.   - maint fluids   MS Leukopenia Leukopenia is likely secondary to her tecfidera, also possibly due to covid. Borderline and no lymphopenia. Available evidence suggests that there are no worse covid symptoms in patients who continue their tecfidera.   - cont. tecfidera   Erythrocytosis Mild, Unclear etiology but appears chronic. Likely secondary to chronic tobacco use with possible underlying OSA.   - recommend outpatient sleep study  TIIDM  Hx of diabetes but last a1c 6.1 2019. She does have orders to check glucose but no meds.   - a1c - novolog sensitive ssi - ssi qhs  CAD HTN On plavix/asa per cardiology. Last  seen 11/2019  - cont. Asa/plavix - cont. metop  Diet: CM/HH VTE: xarelto - allergie to porcine derivatives IVF: LR 125 cc/hr Code: full   Dispo: Admit patient to Inpatient with expected length of stay greater than 2 midnights.  SignedMarty Heck, DO 04/09/2020, 10:54 AM  Pager: [phone]

## 2020-04-10 ENCOUNTER — Encounter (HOSPITAL_COMMUNITY): Payer: Self-pay | Admitting: Internal Medicine

## 2020-04-10 LAB — BASIC METABOLIC PANEL
Anion gap: 10 (ref 5–15)
BUN: 10 mg/dL (ref 6–20)
CO2: 22 mmol/L (ref 22–32)
Calcium: 8.7 mg/dL — ABNORMAL LOW (ref 8.9–10.3)
Chloride: 103 mmol/L (ref 98–111)
Creatinine, Ser: 0.98 mg/dL (ref 0.44–1.00)
GFR calc Af Amer: >60 mL/min (ref >60)
GFR calc non Af Amer: >60 mL/min (ref >60)
Glucose, Bld: 133 mg/dL — ABNORMAL HIGH (ref 70–99)
Potassium: 4 mmol/L (ref 3.5–5.1)
Sodium: 135 mmol/L (ref 135–145)

## 2020-04-10 LAB — FERRITIN: Ferritin: 425 ng/mL — ABNORMAL HIGH (ref 11–307)

## 2020-04-10 LAB — CBC
HCT: 48.2 % — ABNORMAL HIGH (ref 36.0–46.0)
Hemoglobin: 15.7 g/dL — ABNORMAL HIGH (ref 12.0–15.0)
MCH: 30.1 pg (ref 26.0–34.0)
MCHC: 32.6 g/dL (ref 30.0–36.0)
MCV: 92.3 fL (ref 80.0–100.0)
Platelets: 173 10*3/uL (ref 150–400)
RBC: 5.22 MIL/uL — ABNORMAL HIGH (ref 3.87–5.11)
RDW: 13.8 % (ref 11.5–15.5)
WBC: 2.5 10*3/uL — ABNORMAL LOW (ref 4.0–10.5)
nRBC: 0 % (ref 0.0–0.2)

## 2020-04-10 LAB — PROTIME-INR
INR: 1.4 — ABNORMAL HIGH (ref 0.8–1.2)
Prothrombin Time: 16.9 seconds — ABNORMAL HIGH (ref 11.4–15.2)

## 2020-04-10 LAB — APTT: aPTT: 41 seconds — ABNORMAL HIGH (ref 24–36)

## 2020-04-10 LAB — CBG MONITORING, ED
Glucose-Capillary: 123 mg/dL — ABNORMAL HIGH (ref 70–99)
Glucose-Capillary: 130 mg/dL — ABNORMAL HIGH (ref 70–99)

## 2020-04-10 LAB — D-DIMER, QUANTITATIVE: D-Dimer, Quant: 2.48 ug/mL-FEU — ABNORMAL HIGH (ref 0.00–0.50)

## 2020-04-10 LAB — TSH: TSH: 0.415 u[IU]/mL (ref 0.350–4.500)

## 2020-04-10 LAB — C-REACTIVE PROTEIN: CRP: 9.4 mg/dL — ABNORMAL HIGH (ref <1.0)

## 2020-04-10 LAB — GLUCOSE, CAPILLARY: Glucose-Capillary: 157 mg/dL — ABNORMAL HIGH (ref 70–99)

## 2020-04-10 MED ORDER — TRAMADOL HCL 50 MG PO TABS
50.0000 mg | ORAL_TABLET | Freq: Two times a day (BID) | ORAL | Status: DC | PRN
  Filled 2020-04-10 (×2): qty 1

## 2020-04-10 MED ORDER — PREGABALIN 25 MG PO CAPS
25.0000 mg | ORAL_CAPSULE | Freq: Two times a day (BID) | ORAL | Status: DC
  Administered 2020-04-10 – 2020-04-13 (×8): 25 mg via ORAL
  Filled 2020-04-10 (×8): qty 1

## 2020-04-10 MED ORDER — CYCLOBENZAPRINE HCL 10 MG PO TABS
10.0000 mg | ORAL_TABLET | Freq: Two times a day (BID) | ORAL | Status: DC | PRN
  Administered 2020-04-11 – 2020-04-12 (×2): 10 mg via ORAL
  Filled 2020-04-10 (×2): qty 1

## 2020-04-10 NOTE — ED Notes (Signed)
Lunch Tray Ordered @ 1019.

## 2020-04-10 NOTE — ED Notes (Addendum)
Patient transferred to hospital bed for comfort

## 2020-04-10 NOTE — ED Notes (Signed)
Messaged MD x2 about pain medication for patient.

## 2020-04-10 NOTE — ED Notes (Signed)
Breakfast ordered--Tiffany Brady  

## 2020-04-10 NOTE — Progress Notes (Signed)
Subjective: Patient reports that she is feeling well today. She reports that her breathing is doing okay. She reported that she has not had a bowel movement today. Denies any abdominal pain.  She reported some bilateral lower extremity pain, and right leg weakness. She reports that she feels that it's chronic and that she does not think that it's an MS flair. We discussed that we will reach out to neurology today to discuss her MS medication.   Objective:  Vital signs in last 24 hours: Vitals:   04/10/20 0202 04/10/20 0222 04/10/20 0358 04/10/20 0501  BP: (!) 143/75  (!) 150/80 (!) 134/81  Pulse: 66 65 72 63  Resp: (!) 26 18 (!) 32 (!) 25  Temp:      TempSrc:      SpO2: 92% 93% 94% 93%  Weight:      Height:       General: Middle aged female, NAD, laying in bed Cardiac: RRR, no m/r/g Pulmonary: Decreased breath sounds, normal work of breathing Abdomen: Soft, non-tender, non-distended, normoactive bowel sounds Neuro: Alert, 4/5 in RLE, 5/5 in LLE and bilateral upper extremities  Assessment/Plan:  Active Problems:   Acute hypoxemic respiratory failure (HCC)   COVID-19   Acute hypoxemic respiratory failure due to COVID-19 [facility])  This is a 59 year old female with a history of MS, tobacco use, HTN, gout, and CKD stage 2 who intiially presented with a 4 day history of diarrhea and decreased appetite. Noted to have new onset hypoxemia and found to be COVID positive.   Acute hypoxic respiratory failure 2/2 COVID-19 pneumonia: -Currently on dexamethadone and Remdesivir. Patients reports that she is doing well, no diarrheal episodes thus far. On 3L [place] now. Lung exam was difficult to appreciate, diffusely decreased breath sounds, no accessory muscle use. Ferritin and CRP elevated from yesterday. Patient has a history of leukopenia and MS. Will need to closely monitor.   -Continue dexamethasone, day 2/10 -Continue Remdesivir, day 2/5 -Continue tessalon perles BID -Continue  guaifenasin-dextromethorphan PRN -Monitor inflammatory markers -Continue supplemental O2 PRN, wean as tolerated  AKI on CKD: Cr 1.22 on admission, BL unclear, most recent Cr was 1.4 6 months ago. Appeared dry on exam. Given fluids and Cr now improved to 0.98. -Daily BMP  Lateral TWI: Noted on prior EKG, repeat EKG this AM showed persistent TWI. Troponin's have been flat and she denies any chest pain.   MS: Leukopenia: Patient reports RLE weakness is chronic and does not feel like an MS flare. Discussed with neurology about home tecfidera, recommended holding it for now and can resume on discharge.   -Hold home tecfidera  BL LE pain: Unclear etiology, may be related to MS vs fibromyalgia. Appears to have been worsening, she was going to follow up with her PCP regarding this. She had been on tramadol and flexeril at home. Will resume hoem medications for now.  -Continue home tramadol 5- mg q12 hr PRN -Continue home flexeril 10 mg BID PRN  DM: Not on any medications at home. A1c 7 today. Currently on SSI.  -Continue SSI -Consider metformin on discharge  CAD: HTN: Continue home ASA, Plavix, and metoprolol.   Erythrocytosis: -Mild, chronic. Consider outpatient sleep study.   Prior to Admission Living Arrangement: Home Anticipated Discharge Location: Home Barriers to Discharge: Continued medical therapy Dispo: Anticipated discharge in approximately 2-3 day(s).   Asencion Noble, MD 04/10/2020, 6:31 AM Pager: [phone] After 5pm on weekdays and 1pm on weekends: On Call pager [phone]

## 2020-04-10 NOTE — Plan of Care (Signed)
  Problem: Education: Goal: Knowledge of risk factors and measures for prevention of condition will improve Outcome: Progressing   Problem: Coping: Goal: Psychosocial and spiritual needs will be supported Outcome: Progressing   Problem: Respiratory: Goal: Will maintain a patent airway Outcome: Progressing Goal: Complications related to the disease process, condition or treatment will be avoided or minimized Outcome: Progressing   

## 2020-04-10 NOTE — ED Notes (Signed)
Messaged MD about something for pain.

## 2020-04-11 LAB — FERRITIN: Ferritin: 510 ng/mL — ABNORMAL HIGH (ref 11–307)

## 2020-04-11 LAB — BASIC METABOLIC PANEL
Anion gap: 9 (ref 5–15)
BUN: 14 mg/dL (ref 6–20)
CO2: 25 mmol/L (ref 22–32)
Calcium: 8.8 mg/dL — ABNORMAL LOW (ref 8.9–10.3)
Chloride: 103 mmol/L (ref 98–111)
Creatinine, Ser: 1.15 mg/dL — ABNORMAL HIGH (ref 0.44–1.00)
GFR calc Af Amer: 60 mL/min — ABNORMAL LOW (ref >60)
GFR calc non Af Amer: 52 mL/min — ABNORMAL LOW (ref >60)
Glucose, Bld: 122 mg/dL — ABNORMAL HIGH (ref 70–99)
Potassium: 4.6 mmol/L (ref 3.5–5.1)
Sodium: 137 mmol/L (ref 135–145)

## 2020-04-11 LAB — C-REACTIVE PROTEIN: CRP: 3.8 mg/dL — ABNORMAL HIGH (ref <1.0)

## 2020-04-11 LAB — LACTATE DEHYDROGENASE: LDH: 271 U/L — ABNORMAL HIGH (ref 98–192)

## 2020-04-11 LAB — GLUCOSE, CAPILLARY
Glucose-Capillary: 122 mg/dL — ABNORMAL HIGH (ref 70–99)
Glucose-Capillary: 143 mg/dL — ABNORMAL HIGH (ref 70–99)
Glucose-Capillary: 149 mg/dL — ABNORMAL HIGH (ref 70–99)
Glucose-Capillary: 161 mg/dL — ABNORMAL HIGH (ref 70–99)

## 2020-04-11 LAB — D-DIMER, QUANTITATIVE: D-Dimer, Quant: 2.16 ug/mL-FEU — ABNORMAL HIGH (ref 0.00–0.50)

## 2020-04-11 MED ORDER — GUAIFENESIN ER 600 MG PO TB12
600.0000 mg | ORAL_TABLET | Freq: Two times a day (BID) | ORAL | Status: DC
  Administered 2020-04-11 – 2020-04-13 (×5): 600 mg via ORAL
  Filled 2020-04-11 (×5): qty 1

## 2020-04-11 NOTE — Progress Notes (Addendum)
° °  Subjective:  Tiffany Brady is a 59 y.o. with PMH of MS, CKD admit for COVID pneumonia on hospital day 2  Tiffany Brady was examined and evaluated at bedside this am. She was observed resting comfortably in bed. She mentions not having had much opportunity of get out of bed but mentions improvement in her respiratory symptoms. Denies any chest pain, palpitations. Does have some chronic lower extremity weakness due to her MS.  Objective:  Vital signs in last 24 hours: Vitals:   04/10/20 1306 04/10/20 1502 04/10/20 2034 04/11/20 0457  BP:  (!) 153/84 (!) 138/80 (!) 153/81  Pulse:  57 61 61  Resp: 20 23 22 20   Temp:   98.4 F (36.9 C) 98.3 F (36.8 C)  TempSrc:   Oral Oral  SpO2:  94% 91% 90%  Weight:    (!) 117 kg  Height:       Gen: Well-developed, NAD HEENT: NCAT head, hearing intact, EOMI, MMM Pulm: Mild end-expiratory wheezing, no rales, normal work of breathing Extm: ROM intact, No peripheral edema Skin: Dry, Warm, normal turgor Neuro: AAOx3, Answers questions appropriately Psych: Normal mood and affect   Assessment/Plan:  Principal Problem:   Acute hypoxemic respiratory failure due to COVID-19 [facility]) Active Problems:   Acute hypoxemic respiratory failure ([place])   COVID-19  Tiffany Brady is a 59 yo F w/ PMH of MS, tobacco use, HTN, GOUT, CKD2 admit for  COVID pneumonia  Acute hypoxic respiratory failure 2/2 COVID pneumonia Presented w/ acute hypoxic respiratory failure and positive COVID status. On day 3 of treatment. Inflammatory markers improving (CRP 9.4->3.8). Oxygen requirement improving (6L->2L). Denies significant dyspnea or febrile episodes - C/w dexamethasone/remdesivir (day 4/5) - Trend inflammatory markers - Supplemental O2 as needed - Tessalon perles, Mucinex prn  MS Has hx of MS on tecfidera. Noted to have leukopenia, possibly due to med effect vs COVID infection. - Monitor cbc - Hold tecfidera  AKI on CKD3 Baseline creatinine ~1.1. Likely due to  dehydration. Interval improvement w/ fluids. Creatinie 1.22->1.15 - Trend renal fx - Avoid nephrotoxic meds when able  DVT prophx: Xarelto Diet: HH/CM Bowel: Senokot Code: Full  Prior to Admission Living Arrangement: Home Anticipated Discharge Location: Home Barriers to Discharge: Medical treatment Dispo: Anticipated discharge in approximately 1 day(s).   Mosetta Anis, MD 04/11/2020, 11:38 AM Pager: [phone] After 5pm on weekdays and 1pm on weekends: On Call Pager: [phone]

## 2020-04-12 LAB — CBC
HCT: 49.3 % — ABNORMAL HIGH (ref 36.0–46.0)
HCT: 50.5 % — ABNORMAL HIGH (ref 36.0–46.0)
Hemoglobin: 16.3 g/dL — ABNORMAL HIGH (ref 12.0–15.0)
Hemoglobin: 16.2 g/dL — ABNORMAL HIGH (ref 12.0–15.0)
MCH: 29.9 pg (ref 26.0–34.0)
MCH: 28.9 pg (ref 26.0–34.0)
MCHC: 33.1 g/dL (ref 30.0–36.0)
MCHC: 32.1 g/dL (ref 30.0–36.0)
MCV: 90.3 fL (ref 80.0–100.0)
MCV: 90.2 fL (ref 80.0–100.0)
Platelets: 262 10*3/uL (ref 150–400)
Platelets: 255 10*3/uL (ref 150–400)
RBC: 5.46 MIL/uL — ABNORMAL HIGH (ref 3.87–5.11)
RBC: 5.6 MIL/uL — ABNORMAL HIGH (ref 3.87–5.11)
RDW: 13.4 % (ref 11.5–15.5)
RDW: 13.2 % (ref 11.5–15.5)
WBC: 5.4 10*3/uL (ref 4.0–10.5)
WBC: 5.2 10*3/uL (ref 4.0–10.5)
nRBC: 0 % (ref 0.0–0.2)
nRBC: 0 % (ref 0.0–0.2)

## 2020-04-12 LAB — COMPREHENSIVE METABOLIC PANEL
ALT: 30 U/L (ref 0–44)
AST: 32 U/L (ref 15–41)
Albumin: 2.7 g/dL — ABNORMAL LOW (ref 3.5–5.0)
Alkaline Phosphatase: 59 U/L (ref 38–126)
Anion gap: 8 (ref 5–15)
BUN: 14 mg/dL (ref 6–20)
CO2: 25 mmol/L (ref 22–32)
Calcium: 8.7 mg/dL — ABNORMAL LOW (ref 8.9–10.3)
Chloride: 104 mmol/L (ref 98–111)
Creatinine, Ser: 0.99 mg/dL (ref 0.44–1.00)
GFR calc Af Amer: >60 mL/min (ref >60)
GFR calc non Af Amer: >60 mL/min (ref >60)
Glucose, Bld: 113 mg/dL — ABNORMAL HIGH (ref 70–99)
Potassium: 4.1 mmol/L (ref 3.5–5.1)
Sodium: 137 mmol/L (ref 135–145)
Total Bilirubin: 0.6 mg/dL (ref 0.3–1.2)
Total Protein: 6.4 g/dL — ABNORMAL LOW (ref 6.5–8.1)

## 2020-04-12 LAB — FERRITIN: Ferritin: 398 ng/mL — ABNORMAL HIGH (ref 11–307)

## 2020-04-12 LAB — GLUCOSE, CAPILLARY
Glucose-Capillary: 112 mg/dL — ABNORMAL HIGH (ref 70–99)
Glucose-Capillary: 105 mg/dL — ABNORMAL HIGH (ref 70–99)
Glucose-Capillary: 152 mg/dL — ABNORMAL HIGH (ref 70–99)
Glucose-Capillary: 168 mg/dL — ABNORMAL HIGH (ref 70–99)

## 2020-04-12 LAB — D-DIMER, QUANTITATIVE: D-Dimer, Quant: 2.08 ug/mL-FEU — ABNORMAL HIGH (ref 0.00–0.50)

## 2020-04-12 MED ORDER — METOPROLOL TARTRATE 50 MG PO TABS
50.0000 mg | ORAL_TABLET | Freq: Two times a day (BID) | ORAL | Status: DC
  Administered 2020-04-13: 50 mg via ORAL
  Filled 2020-04-12: qty 1

## 2020-04-12 MED ORDER — METOPROLOL TARTRATE 25 MG PO TABS
25.0000 mg | ORAL_TABLET | Freq: Once | ORAL | Status: AC
  Administered 2020-04-12: 25 mg via ORAL

## 2020-04-12 NOTE — Progress Notes (Signed)
° °  Subjective:  Tiffany Brady is a 59 y.o. with PMH of MS, CKD admit for COVID pneumonia on hospital day 3  Tiffany Brady was examined and evaluated at bedside this am. Tiffany Brady was observed resting comfortably in bed. Tiffany Brady mentions having continued dyspnea but overall feels better than on admission.  Objective:  Vital signs in last 24 hours: Vitals:   04/11/20 1712 04/11/20 2105 04/12/20 0525 04/12/20 0527  BP:    (!) 144/74  Pulse:    55  Resp:  18    Temp:  97.9 F (36.6 C)  98 F (36.7 C)  TempSrc:  Oral  Oral  SpO2: 92%  (S) (!) 89%   Weight:    (!) 115.4 kg  Height:       Physical Exam Constitutional:      General: Tiffany Brady is not in acute distress.    Appearance: Tiffany Brady is normal weight.  Cardiovascular:     Rate and Rhythm: Normal rate and regular rhythm.     Heart sounds: Normal heart sounds. No murmur heard.   Pulmonary:     Effort: Pulmonary effort is normal. No tachypnea.     Breath sounds: Wheezing present.  Abdominal:     General: Bowel sounds are normal.     Palpations: Abdomen is soft.  Musculoskeletal:     Right lower leg: No edema.     Left lower leg: No edema.  Skin:    General: Skin is warm and dry.  Neurological:     Mental Status: Tiffany Brady is alert.    Assessment/Plan:  Principal Problem:   Acute hypoxemic respiratory failure due to COVID-19 [facility]) Active Problems:   Acute hypoxemic respiratory failure ([place])   COVID-19  Tiffany Brady is a 59 yo F w/ PMH of MS, tobacco use, HTN, GOUT, CKD2 admit for  COVID pneumonia  Acute hypoxic respiratory failure 2/2 COVID pneumonia Presented w/ acute hypoxic respiratory failure and positive COVID status. On day 4 of treatment. Oxygen requirement fluctuating. With increased night-time requirement at 5L but 2L during daytime. Possibly due to obstructive sleep apnea. Currently denies any  - C/w dexamethasone/remdesivir (day 4/5) - Trend inflammatory markers - Supplemental O2 as needed - Tessalon perles, Mucinex  prn  MS Has hx of MS on tecfidera. Noted to have leukopenia, possibly due to med effect vs COVID infection. - Monitor cbc - Hold tecfidera  AKI on CKD3 Baseline creatinine ~1.1. Likely due to dehydration. Interval improvement w/ fluids. Creatinie 1.22->0.99 - Resolved  DVT prophx: Xarelto Diet: HH/CM Bowel: Senokot Code: Full  Prior to Admission Living Arrangement: Home Anticipated Discharge Location: Home Barriers to Discharge: Medical treatment Dispo: Anticipated discharge in approximately 1 day(s).   Mosetta Anis, MD 04/12/2020, 11:05 AM Pager: [phone] After 5pm on weekdays and 1pm on weekends: On Call Pager: [phone]

## 2020-04-13 LAB — COMPREHENSIVE METABOLIC PANEL
ALT: 34 U/L (ref 0–44)
AST: 30 U/L (ref 15–41)
Albumin: 2.7 g/dL — ABNORMAL LOW (ref 3.5–5.0)
Alkaline Phosphatase: 61 U/L (ref 38–126)
Anion gap: 10 (ref 5–15)
BUN: 16 mg/dL (ref 6–20)
CO2: 24 mmol/L (ref 22–32)
Calcium: 8.4 mg/dL — ABNORMAL LOW (ref 8.9–10.3)
Chloride: 101 mmol/L (ref 98–111)
Creatinine, Ser: 1.06 mg/dL — ABNORMAL HIGH (ref 0.44–1.00)
GFR calc Af Amer: >60 mL/min (ref >60)
GFR calc non Af Amer: 57 mL/min — ABNORMAL LOW (ref >60)
Glucose, Bld: 163 mg/dL — ABNORMAL HIGH (ref 70–99)
Potassium: 3.7 mmol/L (ref 3.5–5.1)
Sodium: 135 mmol/L (ref 135–145)
Total Bilirubin: 0.8 mg/dL (ref 0.3–1.2)
Total Protein: 6.4 g/dL — ABNORMAL LOW (ref 6.5–8.1)

## 2020-04-13 LAB — GLUCOSE, CAPILLARY
Glucose-Capillary: 113 mg/dL — ABNORMAL HIGH (ref 70–99)
Glucose-Capillary: 145 mg/dL — ABNORMAL HIGH (ref 70–99)
Glucose-Capillary: 121 mg/dL — ABNORMAL HIGH (ref 70–99)

## 2020-04-13 LAB — D-DIMER, QUANTITATIVE: D-Dimer, Quant: 1.88 ug/mL-FEU — ABNORMAL HIGH (ref 0.00–0.50)

## 2020-04-13 LAB — FERRITIN: Ferritin: 338 ng/mL — ABNORMAL HIGH (ref 11–307)

## 2020-04-13 LAB — C-REACTIVE PROTEIN: CRP: 1.1 mg/dL — ABNORMAL HIGH (ref <1.0)

## 2020-04-13 MED ORDER — DEXAMETHASONE 6 MG PO TABS
6.0000 mg | ORAL_TABLET | Freq: Every day | ORAL | 0 refills | Status: AC
Start: 2020-04-14 — End: 2020-04-19

## 2020-04-13 MED ORDER — METOPROLOL TARTRATE 50 MG PO TABS
25.0000 mg | ORAL_TABLET | Freq: Two times a day (BID) | ORAL | 3 refills | Status: DC
Start: 2020-04-13 — End: 2020-11-23

## 2020-04-13 MED FILL — DEXAMETHASONE 6 MG TABLET: 6 | 5 days supply | Qty: 5 | Fill #0

## 2020-04-13 NOTE — Progress Notes (Signed)
SATURATION QUALIFICATIONS: (This note is used to comply with regulatory documentation for home oxygen)  Patient Saturations on Room Air at Rest = 84%  Patient Saturations on Room Air while Ambulating = 84%  Patient Saturations on 3 Liters of oxygen while Ambulating = 90%  Please briefly explain why patient needs home oxygen: pt dropped bellow 86% on room air while walking

## 2020-04-13 NOTE — Discharge Instructions (Signed)
Person Under Monitoring Name: Tiffany Brady  Location: [address] [place] [address]   Infection Prevention Recommendations for Individuals Confirmed to have, or Being Evaluated for, 2019 Novel Coronavirus (COVID-19) Infection Who Receive Care at Home  Individuals who are confirmed to have, or are being evaluated for, COVID-19 should follow the prevention steps below until a healthcare provider or local or state health department says they can return to normal activities.  Stay home except to get medical care You should restrict activities outside your home, except for getting medical care. Do not go to work, school, or public areas, and do not use public transportation or taxis.  Call ahead before visiting your doctor Before your medical appointment, call the healthcare provider and tell them that you have, or are being evaluated for, COVID-19 infection. This will help the healthcare provider's office take steps to keep other people from getting infected. Ask your healthcare provider to call the local or state health department.  Monitor your symptoms Seek prompt medical attention if your illness is worsening (e.g., difficulty breathing). Before going to your medical appointment, call the healthcare provider and tell them that you have, or are being evaluated for, COVID-19 infection. Ask your healthcare provider to call the local or state health department.  Wear a facemask You should wear a facemask that covers your nose and mouth when you are in the same room with other people and when you visit a healthcare provider. People who live with or visit you should also wear a facemask while they are in the same room with you.  Separate yourself from other people in your home As much as possible, you should stay in a different room from other people in your home. Also, you should use a separate bathroom, if available.  Avoid sharing household items You  should not share dishes, drinking glasses, cups, eating utensils, towels, bedding, or other items with other people in your home. After using these items, you should wash them thoroughly with soap and water.  Cover your coughs and sneezes Cover your mouth and nose with a tissue when you cough or sneeze, or you can cough or sneeze into your sleeve. Throw used tissues in a lined trash can, and immediately wash your hands with soap and water for at least 20 seconds or use an alcohol-based hand rub.  Wash your [facility] your hands often and thoroughly with soap and water for at least 20 seconds. You can use an alcohol-based hand sanitizer if soap and water are not available and if your hands are not visibly dirty. Avoid touching your eyes, nose, and mouth with unwashed hands.   Prevention Steps for Caregivers and Household Members of Individuals Confirmed to have, or Being Evaluated for, COVID-19 Infection Being Cared for in the Home  If you live with, or provide care at home for, a person confirmed to have, or being evaluated for, COVID-19 infection please follow these guidelines to prevent infection:  Follow healthcare provider's instructions Make sure that you understand and can help the patient follow any healthcare provider instructions for all care.  Provide for the patient's basic needs You should help the patient with basic needs in the home and provide support for getting groceries, prescriptions, and other personal needs.  Monitor the patient's symptoms If they are getting sicker, call his or her medical provider and tell them that the patient has, or is being evaluated for, COVID-19 infection. This will help the healthcare  provider's office take steps to keep other people from getting infected. Ask the healthcare provider to call the local or state health department.  Limit the number of people who have contact with the patient  If possible, have only one caregiver for the  patient.  Other household members should stay in another home or place of residence. If this is not possible, they should stay  in another room, or be separated from the patient as much as possible. Use a separate bathroom, if available.  Restrict visitors who do not have an essential need to be in the home.  Keep older adults, very young children, and other sick people away from the patient Keep older adults, very young children, and those who have compromised immune systems or chronic health conditions away from the patient. This includes people with chronic heart, lung, or kidney conditions, diabetes, and cancer.  Ensure good ventilation Make sure that shared spaces in the home have good air flow, such as from an air conditioner or an opened window, weather permitting.  Wash your hands often  Wash your hands often and thoroughly with soap and water for at least 20 seconds. You can use an alcohol based hand sanitizer if soap and water are not available and if your hands are not visibly dirty.  Avoid touching your eyes, nose, and mouth with unwashed hands.  Use disposable paper towels to dry your hands. If not available, use dedicated cloth towels and replace them when they become wet.  Wear a facemask and gloves  Wear a disposable facemask at all times in the room and gloves when you touch or have contact with the patient's blood, body fluids, and/or secretions or excretions, such as sweat, saliva, sputum, nasal mucus, vomit, urine, or feces.  Ensure the mask fits over your nose and mouth tightly, and do not touch it during use.  Throw out disposable facemasks and gloves after using them. Do not reuse.  Wash your hands immediately after removing your facemask and gloves.  If your personal clothing becomes contaminated, carefully remove clothing and launder. Wash your hands after handling contaminated clothing.  Place all used disposable facemasks, gloves, and other waste in a lined  container before disposing them with other household waste.  Remove gloves and wash your hands immediately after handling these items.  Do not share dishes, glasses, or other household items with the patient  Avoid sharing household items. You should not share dishes, drinking glasses, cups, eating utensils, towels, bedding, or other items with a patient who is confirmed to have, or being evaluated for, COVID-19 infection.  After the person uses these items, you should wash them thoroughly with soap and water.  Wash laundry thoroughly  Immediately remove and wash clothes or bedding that have blood, body fluids, and/or secretions or excretions, such as sweat, saliva, sputum, nasal mucus, vomit, urine, or feces, on them.  Wear gloves when handling laundry from the patient.  Read and follow directions on labels of laundry or clothing items and detergent. In general, wash and dry with the warmest temperatures recommended on the label.  Clean all areas the individual has used often  Clean all touchable surfaces, such as counters, tabletops, doorknobs, bathroom fixtures, toilets, phones, keyboards, tablets, and bedside tables, every day. Also, clean any surfaces that may have blood, body fluids, and/or secretions or excretions on them.  Wear gloves when cleaning surfaces the patient has come in contact with.  Use a diluted bleach solution (e.g., dilute bleach with  1 part bleach and 10 parts water) or a household disinfectant with a label that says EPA-registered for coronaviruses. To make a bleach solution at home, add 1 tablespoon of bleach to 1 quart (4 cups) of water. For a larger supply, add  cup of bleach to 1 gallon (16 cups) of water.  Read labels of cleaning products and follow recommendations provided on product labels. Labels contain instructions for safe and effective use of the cleaning product including precautions you should take when applying the product, such as wearing gloves or  eye protection and making sure you have good ventilation during use of the product.  Remove gloves and wash hands immediately after cleaning.  Monitor yourself for signs and symptoms of illness Caregivers and household members are considered close contacts, should monitor their health, and will be asked to limit movement outside of the home to the extent possible. Follow the monitoring steps for close contacts listed on the symptom monitoring form.   ? If you have additional questions, contact your local health department or call the epidemiologist on call at [phone] (available 24/7). ? This guidance is subject to change. For the most up-to-date guidance from [facility], please refer to their website: [email or web address]

## 2020-04-13 NOTE — TOC Transition Note (Signed)
Transition of Care [facility]) - CM/SW Discharge Note   Patient Details  Name: Meia Emley MRN: [id number] Date of Birth: 06-16-1961  [facility] of [facility]) CM/SW Contact:  Angelita Ingles, RN Phone Number: [phone]  04/13/2020, 4:14 PM   Clinical Narrative:    [facility] consulted for Home oxygen. Home O2 has been set up through [employer]. Portable tank to be delivered to the room. No further discharge needs noted at this time. TOC will sign off.    Final next level of care: Other (comment) (Home with DME oxygen) Barriers to Discharge: No Barriers Identified   Patient Goals and CMS Choice   CMS Medicare.gov Compare Post Acute Care list provided to:: Patient Choice offered to / list presented to : Patient  Discharge Placement                       Discharge Plan and Services                DME Arranged: Oxygen DME Agency: Other - Comment [employer]/gallery [employer] officer) Date DME Agency Contacted: 04/13/20 Time DME Agency Contacted: [phone] Representative spoke with at DME Agency: Brenton Grills HH Arranged: NA [place] Agency: NA        Social Determinants of Health ([place]) Interventions     Readmission Risk Interventions No flowsheet data found.

## 2020-04-13 NOTE — Discharge Summary (Signed)
Name: Tiffany Brady MRN: [id number] DOB: 1961/07/12 59 y.o. PCP: Ladell Pier, MD  Date of Admission: 04/08/2020  4:55 PM Date of Discharge: 04/13/2020 Attending Physician: Axel Filler, *  Discharge Diagnosis: 1. Acute hypoxic respiratory failure 2/2 COVID pneumonia 2. Acute Kidney Injury on Chronic Kidney Disease Stage 2 3. Leukopenia 4. Uterine fibroids  Discharge Medications: Allergies as of 04/13/2020      Reactions   Pork-derived Products Hives      Medication List    TAKE these medications   Accu-Chek Softclix Lancets lancets AS DIRECTED DAILY   AMBULATORY NON FORMULARY MEDICATION 4-wheeled rollator   amLODipine 5 MG tablet Commonly known as: NORVASC TAKE ONE TABLET BY MOUTH DAILY   aspirin EC 81 MG tablet Take 1 tablet (81 mg total) by mouth daily.   atorvastatin 80 MG tablet Commonly known as: LIPITOR TAKE ONE TABLET BY MOUTH DAILY AT 6PM What changed: See the new instructions.   Chantix Continuing Month Pak 1 MG tablet Generic drug: varenicline TAKE ONE TABLET BY MOUTH TWICE A DAY What changed: how much to take   cholecalciferol 25 MCG (1000 UNIT) tablet Commonly known as: VITAMIN D3 Take 5,000 Units by mouth daily.   clopidogrel 75 MG tablet Commonly known as: PLAVIX TAKE ONE TABLET BY MOUTH DAILY   cyclobenzaprine 10 MG tablet Commonly known as: FLEXERIL TAKE 1 TABLET (10 MG TOTAL) BY MOUTH TWO (TWO) TIMES DAILY AS NEEDED FOR MUSCLE SPASMS. What changed:   how much to take  how to take this  when to take this  reasons to take this  additional instructions   dexamethasone 6 MG tablet Commonly known as: DECADRON Take 1 tablet (6 mg total) by mouth daily for 5 days. Start taking on: April 14, 2020   diclofenac sodium 1 % Gel Commonly known as: VOLTAREN APPLY TWO GRAMS TOPICALLY FOUR TIMES A DAY What changed: See the new instructions.   DULoxetine 30 MG capsule Commonly known as: CYMBALTA Take 1 capsule (30 mg  total) by mouth daily. Must have office visit for refills   ezetimibe 10 MG tablet Commonly known as: ZETIA TAKE ONE TABLET BY MOUTH DAILY   fluticasone 50 MCG/ACT nasal spray Commonly known as: FLONASE PLACE ONE SPRAY INTO BOTH NOSTRILS DAILY AS NEEDED FOR ALLERGIES OR RHINITIS What changed: See the new instructions.   glucose blood test strip Commonly known as: True Metrix Blood Glucose Test Check blood sugar once a day   losartan 25 MG tablet Commonly known as: COZAAR Take 1 tablet (25 mg total) by mouth daily. Must have office visit for refills   metoprolol tartrate 50 MG tablet Commonly known as: LOPRESSOR Take 0.5 tablets (25 mg total) by mouth 2 (two) times daily. What changed: how much to take   nitroGLYCERIN 0.4 MG SL tablet Commonly known as: NITROSTAT Place 1 tablet (0.4 mg total) under the tongue every 5 (five) minutes x 3 doses as needed for chest pain.   pregabalin 25 MG capsule Commonly known as: Lyrica Take 1 capsule (25 mg total) by mouth 2 (two) times daily.   Tecfidera 240 MG Cpdr Generic drug: Dimethyl Fumarate Take 1 capsule (240 mg total) by mouth 2 (two) times daily.   traMADol 50 MG tablet Commonly known as: ULTRAM Take 1 tablet (50 mg total) by mouth every 12 (twelve) hours as needed. What changed: reasons to take this   True Metrix Go Glucose Meter w/Device Kit 1 each by Does not apply route every 8 (  eight) hours as needed.            Durable Medical Equipment  (From admission, onward)         Start     Ordered   04/13/20 1242  DME Oxygen  Once       Question Answer Comment  Length of Need 6 Months   Liters per Minute 3   Oxygen delivery system Gas      04/13/20 1241          Disposition and follow-up:   Ms.Kirra Doorn was discharged from [facility] in Stable condition.  At the hospital follow up visit please address:  1. Acute hypoxic respiratory failure 2/2 COVID pneumonia: - Assess respiratory  status - Ensure she completes her dexamethasone as prescribed - Advise her on isolation at home  2. Acute Kidney Injury on Chronic Kidney Disease Stage 2: - Resolved w/ fluids - Check bmp  3. Leukopenia - Resolved w/ treatment for COVID - Ensure she resume her MS meds: Tecfidera - Check cbc  4. Uterine fibroids - Vaginal bleeding while hospitalized - Check cbc - Ensure she f/u with ob/gyn  2.  Labs / imaging needed at time of follow-up: cbc, bmp  3.  Pending labs/ test needing follow-up: N/A  Follow-up Appointments:  Follow-up Information    Ladell Pier, MD. Schedule an appointment as soon as possible for a visit.   Specialty: Internal Medicine Contact information: [place] [address] [phone]        Belva Crome, MD .   Specialty: Cardiology Contact information: [phone] [address] [address] [place] by problem list: 1. Acute hypoxic respiratory failure 2/2 COVID pneumonia: Ms.Vialpando is a 59 yo F w/ PMH of MS, HTn, CKD2, Chronic pain and tobacco use presenting to [facility] w/ complaint of diarrhea. In the ED she was also found to have acute hypoxic respiratory failure. She was found to be COVID+ w/ elevated inflammatory markers. She was started on remdesivir and dexamethasone. She was kept inpatient for 5 days of IV infusion and her inflammatory markers down-trended with improvement in her oxygen requirement. Discharged w/ recommendation to f/u with PCP on supplemental oxygen (3L at exertion, RA at rest)  2. Acute Kidney Injury on Chronic Kidney Disease Stage 2: Noted to have mildly elevated creatinine from baseline at admission. Resolved quickly w/ fluid resuscitation. At discharge, BUN16, Creatinine 1.06.  3. Leukopenia: Noted to have low white count on admission. Thought to be due to COVID infection. Home Tecfidera held during hospitalization. Wbc trended during  hospitalization with resolution on hospital day 4.  4. Uterine fibroids: Noted to have bleeding episode while on DVT prophylaxis with Xarelto. Known to be a chronic issue. Discharged w/ recommendation to f/u with outpatient gynecology Discharge Vitals:   BP (!) 163/94    Pulse 53    Temp 97.6 F (36.4 C) (Oral)    Resp 17    Ht 5' 4"  (1.626 m)    Wt (!) 116.3 kg    LMP 04/21/2014    SpO2 90%    BMI 44.01 kg/m   Pertinent Labs, Studies, and Procedures:  CBC Latest Ref Rng & Units 04/12/2020 04/12/2020 04/10/2020  WBC 4.0 - 10.5 K/uL 5.2 5.4 2.5(L)  Hemoglobin 12.0 - 15.0 g/dL 16.2(H) 16.3(H) 15.7(H)  Hematocrit 36 - 46 % 50.5(H) 49.3(H) 48.2(H)  Platelets 150 - 400 K/uL 255 262 173   BMP Latest Ref Rng & Units 04/13/2020 04/12/2020 04/11/2020  Glucose 70 - 99 mg/dL 163(H) 113(H) 122(H)  BUN 6 - 20 mg/dL 16 14 14   Creatinine 0.44 - 1.00 mg/dL 1.06(H) 0.99 1.15(H)  BUN/Creat Ratio 9 - 23 - - -  Sodium 135 - 145 mmol/L 135 137 137  Potassium 3.5 - 5.1 mmol/L 3.7 4.1 4.6  Chloride 98 - 111 mmol/L 101 104 103  CO2 22 - 32 mmol/L 24 25 25   Calcium 8.9 - 10.3 mg/dL 8.4(L) 8.7(L) 8.8(L)   CHEST  1 VIEW FINDINGS: Mild diffuse chronic appearing increased interstitial lung markings are seen. There is no evidence of a pleural effusion or pneumothorax. Multiple overlying cardiac lead wires are present. The heart size and mediastinal contours are within normal limits. The visualized skeletal structures are unremarkable.  IMPRESSION: No active disease.  CT ABDOMEN AND PELVIS WITH CONTRAST FINDINGS: Lower chest: Streaky and reticulated densities in the bilateral lower lungs with volume loss.  Hepatobiliary: Hepatic steatosis.No evidence of biliary obstruction or stone.  Pancreas: Unremarkable.  Spleen: Unremarkable.  Adrenals/Urinary Tract: Negative adrenals. No hydronephrosis or stone. Unremarkable bladder.  Stomach/Bowel: No obstruction. No appendicitis. Mild  colonic diverticulosis.  Vascular/Lymphatic: No acute vascular abnormality. No mass or adenopathy.  Reproductive:No pathologic findings.  Other: No ascites or pneumoperitoneum. Small fatty left inguinal hernia.  Musculoskeletal: No acute abnormalities. Lumbar spine degeneration with notable spurring and foraminal impingement at L4-5 and L5-S1. There is at L4-5 ankylosis.  IMPRESSION: 1. No acute intra-abdominal finding, including diverticulitis. 2. Hepatic steatosis. 3. Bilateral lower lobe atelectasis. 4. Fatty left inguinal hernia.  Discharge Instructions: Discharge Instructions    Call MD for:  difficulty breathing, headache or visual disturbances   Complete by: As directed    Call MD for:  persistant dizziness or light-headedness   Complete by: As directed    Call MD for:  persistant nausea and vomiting   Complete by: As directed    Diet - low sodium heart healthy   Complete by: As directed    Discharge instructions   Complete by: As directed    Dear [name]  You came to [name] with shortness of breath and diarrhea. We have determined this was caused by COVID infection. Here are our recommendations for you at discharge:  Please take dexamethasone 57m for 5 additional days starting 04/14/20 Please use your home oxygen when you are walking or exerting yourself  Thank you for choosing [facility]   Increase activity slowly   Complete by: As directed      Signed: LMosetta Anis MD 04/13/2020, 1:28 PM Pager: [phone] 5pm on weekdays and 1pm on weekends: On Call Pager: [phone]

## 2020-04-13 NOTE — Progress Notes (Signed)
Tiffany Brady to be D/C'd Home per MD order.  Discussed with the patient and all questions fully answered.  VSS, Skin clean, dry and intact without evidence of skin break down, no evidence of skin tears noted. IV catheter discontinued intact. Site without signs and symptoms of complications. Dressing and pressure applied.  An After Visit Summary was printed and given to the patient. Patient received prescription.  D/c education completed with patient/family including follow up instructions, medication list, d/c activities limitations if indicated, with other d/c instructions as indicated by MD - patient able to verbalize understanding, all questions fully answered.   Patient instructed to return to ED, call 911, or call MD for any changes in condition.   Patient escorted via [place], and D/C home via private auto.  Luci Bank 04/13/2020 6:11 PM

## 2020-04-13 NOTE — Progress Notes (Addendum)
   Subjective:  Tiffany Brady is a 59 y.o. with PMH of MS, CKD admit for COVID pneumonia on hospital day 5  Tiffany Brady was examined and evaluated at bedside this am. She was observed getting ready to go to the bathroom. She denies any significant chest pain or palpitations. Continues to endorse dyspnea with exertion but subjectively improving. Mentions vaginal blood clots overnight which is a chronic issue. Has upcoming gynecology appointment to address this issue.  Objective:  Vital signs in last 24 hours: Vitals:   04/12/20 1600 04/12/20 1627 04/12/20 2055 04/13/20 0510  BP:  (!) 156/78 (!) 165/89 (!) 153/84  Pulse: 59 50 50 51  Resp: 18 21 21 17   Temp: 97.8 F (36.6 C) 98.1 F (36.7 C) (!) 97.5 F (36.4 C) 98.2 F (36.8 C)  TempSrc: Oral Oral Oral Oral  SpO2: 92%  93% 92%  Weight:    (!) 116.3 kg  Height:       Gen: Well-developed, well nourished, NAD HEENT: NCAT head, PERRL, MMM Neck: supple, ROM intact CV: RRR, S1, S2 normal Pulm: CTAB, No wheezing, no rales  Extm: ROM intact, Peripheral pulses intact, No peripheral edema Skin: Dry, Warm  Assessment/Plan:  Principal Problem:   Acute hypoxemic respiratory failure due to COVID-19 [facility]) Active Problems:   Acute hypoxemic respiratory failure ([place])   COVID-19  Tiffany Brady is a 59 yo F w/ PMH of MS, tobacco use, HTN, GOUT, CKD2 admit for COVID pneumonia  Acute hypoxic respiratory failure 2/2 COVID pneumonia Presented w/ acute hypoxic respiratory failure and positive COVID status. On day 5 of treatment. Current resting comfortably ~90s on room air. - C/w remdesivir (day 5/5) - C/w dexamethasone (day 5/10) - Ambulatory pulse ox - Discharge home today with home oxygen if needed - Supplemental O2 as needed - Tessalon perles, Mucinex prn  MS Has hx of MS on tecfidera. Held during hospitalization for concern for leukopenia. Noted to have self-resolved, likely in part due to COVID infection - Monitor cbc - Resume  tecfidera at discharge  Vaginal Bleed Episode of vaginal blood clots overnight. Per patient, has history of postmenopausal bleed. Underwent work-up with Dr.Kendall on 11/2019 w/ recommendation to f/u for endometrial hyperplasia. Hemoglobin checked overnight w/o significant drop. No additional bleeding episodes this am. - D/c Xarelto (was on for DVT prophylaxis) - F/u w/ outpatient ob/gyn  DVT prophx: N/A Diet: HH/CM Bowel: Senokot Code: Full  Prior to Admission Living Arrangement: Home Anticipated Discharge Location: Home Barriers to Discharge: Medical treatment Dispo: Anticipated discharge in approximately today.   Mosetta Anis, MD 04/13/2020, 6:58 AM Pager: [phone] After 5pm on weekdays and 1pm on weekends: On Call Pager: [phone]

## 2020-04-14 ENCOUNTER — Telehealth: Payer: Self-pay

## 2020-04-14 NOTE — Telephone Encounter (Signed)
Transition Care Management Follow-up Telephone Call  Date of discharge and from where: 04/13/2020, [facility]   How have you been since you were released from the hospital? She said she is okay, sleepy, some wheezing.  Any questions or concerns?  none at this time   Items Reviewed:  Did the pt receive and understand the discharge instructions provided? yes  Medications obtained and verified?  she said she has all medications, including the decadron,  and did not have any questions about her med regime.   She verbalized understanding of the change with the metoprolol.   Any new allergies since your discharge?  none reported   Do you have support at home?   She said that she has someone to help her, make sure she has food.   She understands that she is to remain quarantined.  Has glucometer  Has home O2 from [place].  She said she has been using it at 3L when needed, not continuously.   No home health ordered.   Functional Questionnaire: (I = Independent and D = Dependent) ADLs: ndependent  Follow up appointments reviewed:   PCP Hospital f/u appt confirmed? Virtual visit Dr Wynetta Emery 04/24/2020  [facility] f/u appt confirmed?  Neurology 06/05/2020  Are transportation arrangements needed?  they ,may be needed, it depends on where she is going  If their condition worsens, is the pt aware to call PCP or go to the Emergency Dept.?  yes  Was the patient provided with contact information for the PCP's office or ED?  she has the clinic phone number  Was to pt encouraged to call back with questions or concerns? yes

## 2020-04-16 ENCOUNTER — Telehealth: Payer: Self-pay | Admitting: Internal Medicine

## 2020-04-16 DIAGNOSIS — G35 Multiple sclerosis: Secondary | ICD-10-CM

## 2020-04-16 NOTE — Telephone Encounter (Signed)
Will forward to covering provider.

## 2020-04-16 NOTE — Telephone Encounter (Signed)
Please advise.  Copied from [place] [phone]. Topic: General - Other >> Apr 16, 2020  3:15 PM Leward Quan A wrote: Reason for CRM: Patient called to inform Dr Wynetta Emery that she just came out the hospital still battling Covid but having muscle spasms in her legs and need an Rx sent to the pharmacy please. Any question please call Ph# [phone]

## 2020-04-17 MED ORDER — CYCLOBENZAPRINE HCL 10 MG PO TABS: ORAL_TABLET | ORAL | 2 refills | Status: DC

## 2020-04-17 NOTE — Telephone Encounter (Signed)
Prescription has been sent to her pharmacy

## 2020-04-17 NOTE — Telephone Encounter (Signed)
Contacted pt and made aware that Dr. Margarita Rana has sent Flexeril to the pharmacy.

## 2020-04-21 ENCOUNTER — Ambulatory Visit: Payer: Self-pay

## 2020-04-21 DIAGNOSIS — M797 Fibromyalgia: Secondary | ICD-10-CM

## 2020-04-21 MED ORDER — PREGABALIN 25 MG PO CAPS: 25.0000 mg | ORAL_CAPSULE | Freq: Two times a day (BID) | ORAL | 1 refills | Status: DC

## 2020-04-21 NOTE — Telephone Encounter (Signed)
Pt was diagnosed with covid and got out of the hospital on 8.2.21/ Pt asked to speak with Dr. Wynetta Emery due to now having numbness in her feet and hands / Pt also asked about the refill for pregabalin (LYRICA) 25 MG capsule / please advise

## 2020-04-21 NOTE — Telephone Encounter (Signed)
Will forward to pcp

## 2020-04-21 NOTE — Telephone Encounter (Signed)
Pt. Reports she has had numbness to hands and feet that comes and goes since she was in the hospital in July. States she also has MS and decreased mobility. States "in the hospital they had me on Lyrica and that helped." Asking of this can be prescribed for her. Also has a virtual visit appointment for this Friday and asking if can be moved up. Please advise pt.  Answer Assessment - Initial Assessment Questions 1. SYMPTOM: "What is the main symptom you are concerned about?" (e.g., weakness, numbness)     Numbness to hands and feet 2. ONSET: "When did this start?" (minutes, hours, days; while sleeping)     When I was in the hospital 3. LAST NORMAL: "When was the last time you were normal (no symptoms)?"     A while ago 4. PATTERN "Does this come and go, or has it been constant since it started?"  "Is it present now?"     Comes and goes 5. CARDIAC SYMPTOMS: "Have you had any of the following symptoms: chest pain, difficulty breathing, palpitations?"     Shortness of breath 6. NEUROLOGIC SYMPTOMS: "Have you had any of the following symptoms: headache, dizziness, vision loss, double vision, changes in speech, unsteady on your feet?"     No 7. OTHER SYMPTOMS: "Do you have any other symptoms?"     Feet feel cold 8. PREGNANCY: "Is there any chance you are pregnant?" "When was your last menstrual period?"     No  Protocols used: NEUROLOGIC DEFICIT-A-AH

## 2020-04-22 NOTE — Telephone Encounter (Signed)
Contacted pt and made aware that I will be calling in a rx for for Lyrica that Dr. Wynetta Emery prescribed and to keep appt. Pt doesn't have any questions or concerns   [place] to call in ?Lyrica Rx. Spoke to [employer]  Name: Lyrica Strength: 25MG   Sig: Take 1 capsule by omuth 2 times daily  Quantity: 60 Refills: 1

## 2020-04-24 ENCOUNTER — Other Ambulatory Visit: Payer: Self-pay

## 2020-04-24 ENCOUNTER — Ambulatory Visit: Payer: Medicare Other | Attending: Internal Medicine | Admitting: Internal Medicine

## 2020-04-24 DIAGNOSIS — N950 Postmenopausal bleeding: Secondary | ICD-10-CM

## 2020-04-24 DIAGNOSIS — J9601 Acute respiratory failure with hypoxia: Secondary | ICD-10-CM | POA: Diagnosis not present

## 2020-04-24 DIAGNOSIS — G6289 Other specified polyneuropathies: Secondary | ICD-10-CM

## 2020-04-24 DIAGNOSIS — U071 COVID-19: Secondary | ICD-10-CM

## 2020-04-24 DIAGNOSIS — J1282 Pneumonia due to coronavirus disease 2019: Secondary | ICD-10-CM

## 2020-04-24 DIAGNOSIS — Z09 Encounter for follow-up examination after completed treatment for conditions other than malignant neoplasm: Secondary | ICD-10-CM

## 2020-04-24 DIAGNOSIS — G629 Polyneuropathy, unspecified: Secondary | ICD-10-CM | POA: Insufficient documentation

## 2020-04-24 DIAGNOSIS — Z720 Tobacco use: Secondary | ICD-10-CM

## 2020-04-24 NOTE — Progress Notes (Signed)
Virtual Visit via Telephone Note Due to current restrictions/limitations of in-office visits due to the COVID-19 pandemic, this scheduled clinical appointment was converted to a telehealth visit  I connected with Tiffany Brady on 04/24/20 at 12:08 p.m by telephone and verified that I am speaking with the correct person using two identifiers. I am in my office.  The patient is at home.  Only the patient and myself participated in this encounter.  I discussed the limitations, risks, security and privacy concerns of performing an evaluation and management service by telephone and the availability of in person appointments. I also discussed with the patient that there may be a patient responsible charge related to this service. The patient expressed understanding and agreed to proceed.   History of Present Illness: Pt with hx of DM type 2, HTN, Tobdep,CAD,obesity, fibromyalgia, CKD stage 3 andmultiple sclerosis.Last evaluated by me 12/24/2019.  Purpose of today's visit is transition of care. Date of hospitalization: 7/28-04/13/2020 Date of telephone visit from care coordinator: 04/14/2020  Patient hospitalized with diarrhea and found to have acute hypoxic respiratory failure.  She tested positive for COVID-19.  Treated with remdesivir and dexamethasone.  She had leukopenia that improved with treatment.  She was eventually discharged home on supplemental oxygen 3 L with exertion but room air at rest.  She also had acute kidney injury on CKD.  This improved with fluid resuscitation.  She also had episode of vaginal bleeding while in the hospital while on DVT prophylaxis with Xarelto.  Patient told to follow-up with GYN.  Today:  COVID:  Still having to use the O2 wiith ambulation due to DOE but feels she is getting better -cough better and no fever since hosp dischg.  Finished Dexamethasone. She was not vaccinated at the time she got infected.  She states that she had traveled to [place] to visit her  daughter who was sick and when she came back this when she herself became sick.  Vaginal bleeding:  Pt reports she bled until 2 days after dischg from hosp then it stopped.  She was told to follow-up with her gynecologist.  She had seen the gynecologist Dr. Delilah Shan earlier this year for postmenopausal bleeding.  She had endometrial biopsy which was negative.  C/o intermittent  numbness in hands and feet which started the middle of last mth after she had traveled to [place].  She denies any discoloration of the hands and feet -she has restarted Tecfidera and started the Lyrica yesterday.   HTN:  Reports compliance with Cozaar, Norvasc and Metoprolol.  She limits salt in foods No CP or LE edema She has dec to 3 cig/day.  Trying to quit. Not taking Chantix.  She last Outpatient Encounter Medications as of 04/24/2020  Medication Sig Note  . Accu-Chek Softclix Lancets lancets AS DIRECTED DAILY   . AMBULATORY NON FORMULARY MEDICATION 4-wheeled rollator   . amLODipine (NORVASC) 5 MG tablet TAKE ONE TABLET BY MOUTH DAILY (Patient taking differently: Take 5 mg by mouth daily. )   . aspirin EC 81 MG tablet Take 1 tablet (81 mg total) by mouth daily.   Marland Kitchen atorvastatin (LIPITOR) 80 MG tablet TAKE ONE TABLET BY MOUTH DAILY AT 6PM (Patient taking differently: Take 80 mg by mouth every evening. )   . Blood Glucose Monitoring Suppl (TRUE METRIX GO GLUCOSE METER) w/Device KIT 1 each by Does not apply route every 8 (eight) hours as needed.   . CHANTIX CONTINUING MONTH PAK 1 MG tablet TAKE ONE TABLET BY MOUTH  TWICE A DAY (Patient taking differently: Take 1 mg by mouth 2 (two) times daily. )   . cholecalciferol (VITAMIN D3) 25 MCG (1000 UT) tablet Take 5,000 Units by mouth daily.   . clopidogrel (PLAVIX) 75 MG tablet TAKE ONE TABLET BY MOUTH DAILY (Patient taking differently: Take 75 mg by mouth daily. )   . cyclobenzaprine (FLEXERIL) 10 MG tablet TAKE 1 TABLET (10 MG TOTAL) BY MOUTH TWO (TWO) TIMES DAILY AS NEEDED FOR  MUSCLE SPASMS.   Marland Kitchen diclofenac sodium (VOLTAREN) 1 % GEL APPLY TWO GRAMS TOPICALLY FOUR TIMES A DAY (Patient taking differently: Apply 2 g topically 4 (four) times daily as needed (pain). )   . Dimethyl Fumarate (TECFIDERA) 240 MG CPDR Take 1 capsule (240 mg total) by mouth 2 (two) times daily. 04/09/2020: Pt has this med with her  . DULoxetine (CYMBALTA) 30 MG capsule Take 1 capsule (30 mg total) by mouth daily. Must have office visit for refills 04/09/2020: No recent fill hx  . ezetimibe (ZETIA) 10 MG tablet TAKE ONE TABLET BY MOUTH DAILY (Patient taking differently: Take 10 mg by mouth daily. )   . fluticasone (FLONASE) 50 MCG/ACT nasal spray PLACE ONE SPRAY INTO BOTH NOSTRILS DAILY AS NEEDED FOR ALLERGIES OR RHINITIS (Patient taking differently: Place 1 spray into both nostrils daily as needed for allergies. )   . glucose blood (TRUE METRIX BLOOD GLUCOSE TEST) test strip Check blood sugar once a day   . losartan (COZAAR) 25 MG tablet Take 1 tablet (25 mg total) by mouth daily. Must have office visit for refills (Patient not taking: Reported on 04/09/2020)   . metoprolol tartrate (LOPRESSOR) 50 MG tablet Take 0.5 tablets (25 mg total) by mouth 2 (two) times daily.   . nitroGLYCERIN (NITROSTAT) 0.4 MG SL tablet Place 1 tablet (0.4 mg total) under the tongue every 5 (five) minutes x 3 doses as needed for chest pain.   . pregabalin (LYRICA) 25 MG capsule Take 1 capsule (25 mg total) by mouth 2 (two) times daily.   . traMADol (ULTRAM) 50 MG tablet Take 1 tablet (50 mg total) by mouth every 12 (twelve) hours as needed. (Patient taking differently: Take 50 mg by mouth every 12 (twelve) hours as needed for moderate pain. )    No facility-administered encounter medications on file as of 04/24/2020.      Observations/Objective: Lab Results  Component Value Date   HGBA1C 7.0 (H) 04/09/2020   Lab Results  Component Value Date   WBC 5.2 04/12/2020   HGB 16.2 (H) 04/12/2020   HCT 50.5 (H) 04/12/2020    MCV 90.2 04/12/2020   PLT 255 04/12/2020     Chemistry      Component Value Date/Time   NA 135 04/13/2020 0419   NA 139 10/09/2019 1136   K 3.7 04/13/2020 0419   CL 101 04/13/2020 0419   CO2 24 04/13/2020 0419   BUN 16 04/13/2020 0419   BUN 10 10/09/2019 1136   CREATININE 1.06 (H) 04/13/2020 0419   CREATININE 1.40 (H) 03/28/2016 1601      Component Value Date/Time   CALCIUM 8.4 (L) 04/13/2020 0419   ALKPHOS 61 04/13/2020 0419   AST 30 04/13/2020 0419   ALT 34 04/13/2020 0419   BILITOT 0.8 04/13/2020 0419   BILITOT 0.3 10/09/2019 1136       Assessment and Plan: 1. Hospital discharge follow-up 2. Acute respiratory failure with hypoxia (HCC) Clinically better but still requiring oxygen.  We will have her follow-up  with [name] in about 3 weeks in person for reevaluation and determination of whether she still needs oxygen at that point.  3. Pneumonia due to COVID-19 virus See #2 above.  As long as she continues to improve this is good.  Advised patient that if she develops any shortness of breath or hemoptysis, she needs to be seen in the emergency room.  4. Postmenopausal bleeding Evaluated by gynecology earlier this year.  Will just have a follow-up with them to touch base but I think the bleeding may have been caused by the Xarelto - Ambulatory referral to Gynecology  5. Other polyneuropathy Causes can include diabetes or MS or both.  She just restarted the Lyrica.  She is on a low dose.  I told her to let me know if she feels the dose needs to be increased  6. Tobacco abuse Strongly advised to quit smoking.  Commended her on how she has been able to cut back but the goal is to quit completely.  She declines me sending a prescription for the starter pack of Chantix so that she can start over.  She feels she may be able to quit without having to restart the Chantix.   Follow Up Instructions: 3 wks to re-eval need for continue O2   I discussed the assessment and treatment  plan with the patient. The patient was provided an opportunity to ask questions and all were answered. The patient agreed with the plan and demonstrated an understanding of the instructions.   The patient was advised to call back or seek an in-person evaluation if the symptoms worsen or if the condition fails to improve as anticipated.  I provided 13 minutes of non-face-to-face time during this encounter.   Karle Plumber, MD

## 2020-04-24 NOTE — Progress Notes (Signed)
Pt states her feet feels numb and she is scared to walk

## 2020-05-04 ENCOUNTER — Other Ambulatory Visit: Payer: Self-pay | Admitting: Internal Medicine

## 2020-05-04 NOTE — Telephone Encounter (Signed)
Requested Prescriptions  Pending Prescriptions Disp Refills  . CHANTIX CONTINUING MONTH PAK 1 MG tablet [Pharmacy Med Name: CHANTIX 1MG  TAB] 56 tablet 0    Sig: TAKE ONE TABLET BY MOUTH TWICE A DAY     Psychiatry:  Drug Dependence Therapy Passed - 05/04/2020  3:19 PM      Passed - Valid encounter within last 12 months    Recent Outpatient Visits          1 week ago Hospital discharge follow-up   [place] Karle Plumber B, MD   4 months ago Controlled type 2 diabetes mellitus with other circulatory complication, without long-term current use of insulin ([place])   [place], Neoma Laming B, MD   7 months ago Controlled type 2 diabetes mellitus with other circulatory complication, without long-term current use of insulin [facility])   [place], Deborah B, MD   8 months ago Postmenopausal bleeding   [place], Deborah B, MD   1 year ago Controlled type 2 diabetes mellitus with other circulatory complication, without long-term current use of insulin [facility])   [place], Deborah B, MD      Future Appointments            In 2 weeks Ladell Pier, MD [place]

## 2020-05-07 ENCOUNTER — Other Ambulatory Visit: Payer: Self-pay | Admitting: Internal Medicine

## 2020-05-07 DIAGNOSIS — I10 Essential (primary) hypertension: Secondary | ICD-10-CM

## 2020-05-07 NOTE — Telephone Encounter (Signed)
Approved per protocol.  

## 2020-05-20 ENCOUNTER — Other Ambulatory Visit: Payer: Self-pay | Admitting: Internal Medicine

## 2020-05-22 ENCOUNTER — Encounter: Payer: Self-pay | Admitting: Neurology

## 2020-05-22 ENCOUNTER — Ambulatory Visit (INDEPENDENT_AMBULATORY_CARE_PROVIDER_SITE_OTHER): Payer: Medicare Other | Admitting: Neurology

## 2020-05-22 ENCOUNTER — Telehealth: Payer: Self-pay

## 2020-05-22 ENCOUNTER — Other Ambulatory Visit: Payer: Self-pay

## 2020-05-22 ENCOUNTER — Encounter: Payer: Self-pay | Admitting: Internal Medicine

## 2020-05-22 ENCOUNTER — Ambulatory Visit: Payer: Medicare Other | Attending: Internal Medicine | Admitting: Internal Medicine

## 2020-05-22 VITALS — BP 138/82 | HR 82 | Temp 97.8°F | Resp 16 | Wt 261.6 lb

## 2020-05-22 VITALS — BP 139/85 | HR 90 | Ht 64.0 in | Wt 262.0 lb

## 2020-05-22 DIAGNOSIS — Z6379 Other stressful life events affecting family and household: Secondary | ICD-10-CM

## 2020-05-22 DIAGNOSIS — G35 Multiple sclerosis: Secondary | ICD-10-CM

## 2020-05-22 DIAGNOSIS — U071 COVID-19: Secondary | ICD-10-CM | POA: Diagnosis not present

## 2020-05-22 DIAGNOSIS — Z720 Tobacco use: Secondary | ICD-10-CM | POA: Diagnosis not present

## 2020-05-22 DIAGNOSIS — R2681 Unsteadiness on feet: Secondary | ICD-10-CM | POA: Diagnosis not present

## 2020-05-22 DIAGNOSIS — Z23 Encounter for immunization: Secondary | ICD-10-CM

## 2020-05-22 DIAGNOSIS — G6289 Other specified polyneuropathies: Secondary | ICD-10-CM

## 2020-05-22 DIAGNOSIS — I10 Essential (primary) hypertension: Secondary | ICD-10-CM | POA: Diagnosis not present

## 2020-05-22 DIAGNOSIS — M79605 Pain in left leg: Secondary | ICD-10-CM | POA: Diagnosis not present

## 2020-05-22 DIAGNOSIS — R0602 Shortness of breath: Secondary | ICD-10-CM

## 2020-05-22 DIAGNOSIS — M79604 Pain in right leg: Secondary | ICD-10-CM

## 2020-05-22 DIAGNOSIS — J1282 Pneumonia due to coronavirus disease 2019: Secondary | ICD-10-CM

## 2020-05-22 DIAGNOSIS — F17210 Nicotine dependence, cigarettes, uncomplicated: Secondary | ICD-10-CM

## 2020-05-22 MED ORDER — PREGABALIN 50 MG PO CAPS: 50.0000 mg | ORAL_CAPSULE | Freq: Two times a day (BID) | ORAL | 6 refills | Status: DC

## 2020-05-22 NOTE — Progress Notes (Signed)
Follow-up Visit   Date: 05/22/20   Tiffany Brady MRN: [id number] DOB: 02/07/1961   Interim History: Tiffany Brady is a 59 y.o. right-handed African American female with chronic pain syndrome, fibromyaglia, hypertension, and tobacco use returning to the clinic for follow-up of multiple sclerosis.  The patient was accompanied to the clinic by self.  She reports having increased numbness in the hands and legs over the past several months.  She also has chronic pain in the legs and imbalance.  She has not done PT.  She has been compliant with taking dimethyll fumerate.    She had COVID in July and recovered well. She has since been vaccinated.  She recently traveled back from [place] where she went to visit her daughter who is in hospice due to sepsis.   Medications:  Current Outpatient Medications on File Prior to Visit  Medication Sig Dispense Refill  . Accu-Chek Softclix Lancets lancets AS DIRECTED DAILY 100 each 11  . AMBULATORY NON FORMULARY MEDICATION 4-wheeled rollator 1 each 0  . amLODipine (NORVASC) 5 MG tablet TAKE ONE TABLET BY MOUTH DAILY 90 tablet 0  . APO-VARENICLINE 1 MG tablet TAKE ONE TABLET BY MOUTH TWICE A DAY 180 tablet 1  . aspirin EC 81 MG tablet Take 1 tablet (81 mg total) by mouth daily. 100 tablet 1  . atorvastatin (LIPITOR) 80 MG tablet TAKE ONE TABLET BY MOUTH DAILY AT 6PM (Patient taking differently: Take 80 mg by mouth every evening. ) 90 tablet 3  . Blood Glucose Monitoring Suppl (TRUE METRIX GO GLUCOSE METER) w/Device KIT 1 each by Does not apply route every 8 (eight) hours as needed. 1 kit 0  . cholecalciferol (VITAMIN D3) 25 MCG (1000 UT) tablet Take 5,000 Units by mouth daily.    . clopidogrel (PLAVIX) 75 MG tablet TAKE ONE TABLET BY MOUTH DAILY (Patient taking differently: Take 75 mg by mouth daily. ) 30 tablet 11  . cyclobenzaprine (FLEXERIL) 10 MG tablet TAKE 1 TABLET (10 MG TOTAL) BY MOUTH TWO (TWO) TIMES DAILY AS NEEDED FOR MUSCLE SPASMS. 60  tablet 2  . diclofenac sodium (VOLTAREN) 1 % GEL APPLY TWO GRAMS TOPICALLY FOUR TIMES A DAY (Patient taking differently: Apply 2 g topically 4 (four) times daily as needed (pain). ) 100 g 0  . Dimethyl Fumarate (TECFIDERA) 240 MG CPDR Take 1 capsule (240 mg total) by mouth 2 (two) times daily. 180 capsule 3  . DULoxetine (CYMBALTA) 30 MG capsule Take 1 capsule (30 mg total) by mouth daily. Must have office visit for refills 30 capsule 0  . ezetimibe (ZETIA) 10 MG tablet TAKE ONE TABLET BY MOUTH DAILY (Patient taking differently: Take 10 mg by mouth daily. ) 90 tablet 3  . fluticasone (FLONASE) 50 MCG/ACT nasal spray PLACE ONE SPRAY INTO BOTH NOSTRILS DAILY AS NEEDED FOR ALLERGIES OR RHINITIS (Patient taking differently: Place 1 spray into both nostrils daily as needed for allergies. ) 16 g 0  . glucose blood (TRUE METRIX BLOOD GLUCOSE TEST) test strip Check blood sugar once a day 100 each 12  . losartan (COZAAR) 25 MG tablet Take 1 tablet (25 mg total) by mouth daily. Must have office visit for refills 30 tablet 0  . metoprolol tartrate (LOPRESSOR) 50 MG tablet Take 0.5 tablets (25 mg total) by mouth 2 (two) times daily. 180 tablet 3  . nitroGLYCERIN (NITROSTAT) 0.4 MG SL tablet Place 1 tablet (0.4 mg total) under the tongue every 5 (five) minutes x 3 doses as needed  for chest pain. 25 tablet 12  . pregabalin (LYRICA) 25 MG capsule Take 1 capsule (25 mg total) by mouth 2 (two) times daily. 60 capsule 1  . traMADol (ULTRAM) 50 MG tablet Take 1 tablet (50 mg total) by mouth every 12 (twelve) hours as needed. (Patient taking differently: Take 50 mg by mouth every 12 (twelve) hours as needed for moderate pain. ) 20 tablet 0   No current facility-administered medications on file prior to visit.    Allergies:  Allergies  Allergen Reactions  . Pork-Derived Products Hives     Vital Signs:  BP 139/85   Pulse 90   Ht _0  (1.626 m)   Wt 262 lb (118.8 kg)   LMP 04/21/2014   SpO2 92%   BMI 44.97  kg/m   Neurological Exam: MENTAL STATUS including orientation to time, place, person, recent and remote memory, attention span and concentration, language, and fund of knowledge is normal.  Speech is not dysarthric.  CRANIAL NERVES: No visual field defects. Pupils equal round and reactive to light.  Normal conjugate, extra-ocular eye movements in all directions of gaze.  No ptosis.    MOTOR:  Motor strength is 5/5 in all extremities, except bilateral hip flexion shows 4/5.  No pronator drift.  Tone is normal.    MSRs:  Reflexes are 3+/4 throughout.  SENSORY:  Intact to vibration, pin prick, and temperature.  COORDINATION/GAIT:  Normal finger to nose.  Gait is wide-based, unsteady with a three prong cane  Data: Records received from [facility] in [place]  MRI brain with and without contrast 10/26/2016: Extensive periventricular hyperintensity which demonstrate distribution and morphology consistent with multiple sclerosis. There is no evidence of enhancement to suggest acute demyelination.  MRI cervical spine wwo contrast 07/31/2015: Multilevel degenerative change in the cervical spine with mild spinal stenosis at C3-4, C4-5, C5-6. Left foraminal narrowing at C5-6 due to disc and osteophyte.  Spinal cord appears normal without cord lesion or abnormal enhancement. No cord compression.  MRI brain wwo contrast 07/24/2015: Extensive and premature for age white matter signal abnormality strongly suspicious for multiple sclerosis. Chronic microvascular ischemic changes are not excluded, but are less favored.  No restricted diffusion or abnormal postcontrast enhancement at any location, but specifically in association with the white matter, to suggest an acute process.  Cervical spondylosis with suspected central disc protrusion/extrusion at C4-5 resulting in cord compression. This could contribute to numbness and pain in the extremities. Correlate clinically for  radiculopathy or myelopathy. MRI cervical spine may be helpful in further evaluation as clinically indicated.  EMG of the upper extremities 05/26/2015: 1. Bilateral median neuropathy at or distal to the wrist, consistent with clinical diagnosis of carpal tunnel syndrome. Overall, these findings are mild in degree electrically and worse on the right. 2. There is no evidence of a cervical radiculopathy or diffuse myopathy affecting upper extremities.  MRI brain wwo contrast 02/01/2018: Numerous small periventricular white matter hyperintensities are stable since 2016 and are consistent with the clinical diagnosis of multiple sclerosis. No active lesions identified. Interval development of chronic microhemorrhage in the left thalamus and left occipital lobe, question hypertension  Labs 09/29/2014: RF 12, ANA neg, ESR 23  CSF testing 08/14/2015:  R34 W0 P37 G67, IgG index 2.8, MPB < 2.0, OCB  >5 well defined gamma restriction bands that are also present in the patient's corresponding serum sample, but some bands in the CSF are more prominent   IMPRESSION/PLAN: 1.  Relapsing remitting multiple  sclerosis, diagnosed 2016. Disease burden involves periventricular white matter, which has been stable on imaging.   She has been on disease modifying therapy since 2017 with Tecfidera. Clinically, she complains of generalized numbness and leg weakness. Will check for disease progression as she is due to imaging. Check MRI brain and cervical spine wwo contrast for surveillance Continue Tecfidera 223m twice daly Continue vitamin D Start PT for leg strengthening .   2.  Hypertensive microhemorrhoage involving the left thalamic and occipital region, asymptomatic.    3.  Generalized pain due to fibromyalgia, follow-up with PCP   Return to clinic in 6 months   Thank you for allowing me to participate in patient's care.  If I can answer any additional questions, I would be pleased to do so.     Sincerely,    Jaylin Roundy K. PPosey Pronto DO

## 2020-05-22 NOTE — Telephone Encounter (Signed)
Called patient and informed her to reach out to PCP in regards to her pain. Patient verbalized understanding.

## 2020-05-22 NOTE — Patient Instructions (Signed)
Influenza Virus Vaccine injection (Fluarix) What is this medicine? INFLUENZA VIRUS VACCINE (in floo EN zuh VAHY ruhs vak SEEN) helps to reduce the risk of getting influenza also known as the flu. This medicine may be used for other purposes; ask your health care provider or pharmacist if you have questions. COMMON BRAND NAME(S): Fluarix, Fluzone What should I tell my health care provider before I take this medicine? They need to know if you have any of these conditions:  bleeding disorder like hemophilia  fever or infection  Guillain-Barre syndrome or other neurological problems  immune system problems  infection with the human immunodeficiency virus (HIV) or AIDS  low blood platelet counts  multiple sclerosis  an unusual or allergic reaction to influenza virus vaccine, eggs, chicken proteins, latex, gentamicin, other medicines, foods, dyes or preservatives  pregnant or trying to get pregnant  breast-feeding How should I use this medicine? This vaccine is for injection into a muscle. It is given by a health care professional. A copy of Vaccine Information Statements will be given before each vaccination. Read this sheet carefully each time. The sheet may change frequently. Talk to your pediatrician regarding the use of this medicine in children. Special care may be needed. Overdosage: If you think you have taken too much of this medicine contact a poison control center or emergency room at once. NOTE: This medicine is only for you. Do not share this medicine with others. What if I miss a dose? This does not apply. What may interact with this medicine?  chemotherapy or radiation therapy  medicines that lower your immune system like etanercept, anakinra, infliximab, and adalimumab  medicines that treat or prevent blood clots like warfarin  phenytoin  steroid medicines like prednisone or cortisone  theophylline  vaccines This list may not describe all possible  interactions. Give your health care provider a list of all the medicines, herbs, non-prescription drugs, or dietary supplements you use. Also tell them if you smoke, drink alcohol, or use illegal drugs. Some items may interact with your medicine. What should I watch for while using this medicine? Report any side effects that do not go away within 3 days to your doctor or health care professional. Call your health care provider if any unusual symptoms occur within 6 weeks of receiving this vaccine. You may still catch the flu, but the illness is not usually as bad. You cannot get the flu from the vaccine. The vaccine will not protect against colds or other illnesses that may cause fever. The vaccine is needed every year. What side effects may I notice from receiving this medicine? Side effects that you should report to your doctor or health care professional as soon as possible:  allergic reactions like skin rash, itching or hives, swelling of the face, lips, or tongue Side effects that usually do not require medical attention (report to your doctor or health care professional if they continue or are bothersome):  fever  headache  muscle aches and pains  pain, tenderness, redness, or swelling at site where injected  weak or tired This list may not describe all possible side effects. Call your doctor for medical advice about side effects. You may report side effects to FDA at 1-800-FDA-1088. Where should I keep my medicine? This vaccine is only given in a clinic, pharmacy, doctor's office, or other health care setting and will not be stored at home. NOTE: This sheet is a summary. It may not cover all possible information. If you have questions   about this medicine, talk to your doctor, pharmacist, or health care provider.  2020 Elsevier/Gold Standard (2008-03-26 09:30:40)  

## 2020-05-22 NOTE — Progress Notes (Signed)
Patient ID: Tiffany Brady, female    DOB: [birth year]  MRN: [id number]  CC: Follow-up (3 week )   Subjective: Tiffany Brady is a 59 y.o. female who presents for chronic ds management Her concerns today include:  Pt with hx of DM type 2, HTN, Tobdep,CAD,obesity, fibromyalgia, CKD stage 3 andmultiple sclerosis, COVID pneumonia 03/2020.  COVID Pneumonia: We did telephone visit with her on 04/24/2020 post hospitalization for Covid pneumonia.  At the time, she had clinically improved but was still requiring home oxygen.  We had planned for her to be check today to see whether she still requires oxygen. -tells me she still uses O2 sometimes at nights about QOD depending on how much she did that day.  -She gets winded when having to walk for more than 5 to 10 minutes. Cough has resolved.  CAD/HTN:  Did not take meds as yet because she has not eaten as yet for today. However she reports compliance with medications. -no use of SL Nitro.   -increase smoking to 5 cigarettes a day and 3 a day since we last spoke.  Attributes to stress thinking about her daughter in [place] who is still in [place] on [place] appt with GYN Dr. Delilah Shan because vaginal bleeding had stopped with no further episodes since hospital. Polyneuropathy: She reports that she has been taking 2 of the 25 mg of the Lyrica twice a day instead of 1 twice a day. She feels she does better with the higher dose and is finding it to be very helpful.  HM:  Agree for flu shot.  Received 1st Pfizer shot 05/09/20 and 2nd one will be give 05/30/20  Patient Active Problem List   Diagnosis Date Noted   Peripheral neuropathy 04/24/2020   COVID-19 04/09/2020   Postmenopausal bleeding 08/12/2019   Alopecia 03/29/2019   Subcutaneous cyst 03/29/2019   Snoring 12/10/2018   NSTEMI (non-ST elevated myocardial infarction) ([place]) 08/21/2018   De Quervain's disease (tenosynovitis) 08/02/2018   Body mass index 40.0-44.9, adult ([place])  08/02/2018   Asymmetrical hearing loss of both ears 01/03/2018   Stress incontinence 05/08/2017   Cervical radiculopathy 05/08/2017   CKD (chronic kidney disease) stage 3, GFR 30-59 ml/min 04/04/2017   Vitamin D deficiency 04/04/2017   Diabetes mellitus type II, controlled ([place]) 04/04/2017   Fibromyalgia 03/28/2016   Multiple sclerosis ([place]) 11/02/2015   Tobacco abuse 04/24/2014   Back muscle spasm 01/28/2014   Migraine headache 07/23/2013   Essential hypertension 06/27/2013   Chronic pain syndrome 06/27/2013   Morbid obesity ([facility]) 06/27/2013     Current Outpatient Medications on File Prior to Visit  Medication Sig Dispense Refill   Accu-Chek Softclix Lancets lancets AS DIRECTED DAILY 100 each 11   AMBULATORY NON FORMULARY MEDICATION 4-wheeled rollator 1 each 0   amLODipine (NORVASC) 5 MG tablet TAKE ONE TABLET BY MOUTH DAILY 90 tablet 0   APO-VARENICLINE 1 MG tablet TAKE ONE TABLET BY MOUTH TWICE A DAY 180 tablet 1   aspirin EC 81 MG tablet Take 1 tablet (81 mg total) by mouth daily. 100 tablet 1   atorvastatin (LIPITOR) 80 MG tablet TAKE ONE TABLET BY MOUTH DAILY AT 6PM (Patient taking differently: Take 80 mg by mouth every evening. ) 90 tablet 3   Blood Glucose Monitoring Suppl (TRUE METRIX GO GLUCOSE METER) w/Device KIT 1 each by Does not apply route every 8 (eight) hours as needed. 1 kit 0   cholecalciferol (VITAMIN D3) 25 MCG (1000 UT) tablet Take  5,000 Units by mouth daily.     clopidogrel (PLAVIX) 75 MG tablet TAKE ONE TABLET BY MOUTH DAILY (Patient taking differently: Take 75 mg by mouth daily. ) 30 tablet 11   cyclobenzaprine (FLEXERIL) 10 MG tablet TAKE 1 TABLET (10 MG TOTAL) BY MOUTH TWO (TWO) TIMES DAILY AS NEEDED FOR MUSCLE SPASMS. 60 tablet 2   diclofenac sodium (VOLTAREN) 1 % GEL APPLY TWO GRAMS TOPICALLY FOUR TIMES A DAY (Patient taking differently: Apply 2 g topically 4 (four) times daily as needed (pain). ) 100 g 0   Dimethyl Fumarate  (TECFIDERA) 240 MG CPDR Take 1 capsule (240 mg total) by mouth 2 (two) times daily. 180 capsule 3   DULoxetine (CYMBALTA) 30 MG capsule Take 1 capsule (30 mg total) by mouth daily. Must have office visit for refills 30 capsule 0   ezetimibe (ZETIA) 10 MG tablet TAKE ONE TABLET BY MOUTH DAILY (Patient taking differently: Take 10 mg by mouth daily. ) 90 tablet 3   fluticasone (FLONASE) 50 MCG/ACT nasal spray PLACE ONE SPRAY INTO BOTH NOSTRILS DAILY AS NEEDED FOR ALLERGIES OR RHINITIS (Patient taking differently: Place 1 spray into both nostrils daily as needed for allergies. ) 16 g 0   glucose blood (TRUE METRIX BLOOD GLUCOSE TEST) test strip Check blood sugar once a day 100 each 12   losartan (COZAAR) 25 MG tablet Take 1 tablet (25 mg total) by mouth daily. Must have office visit for refills 30 tablet 0   metoprolol tartrate (LOPRESSOR) 50 MG tablet Take 0.5 tablets (25 mg total) by mouth 2 (two) times daily. 180 tablet 3   nitroGLYCERIN (NITROSTAT) 0.4 MG SL tablet Place 1 tablet (0.4 mg total) under the tongue every 5 (five) minutes x 3 doses as needed for chest pain. 25 tablet 12   traMADol (ULTRAM) 50 MG tablet Take 1 tablet (50 mg total) by mouth every 12 (twelve) hours as needed. (Patient taking differently: Take 50 mg by mouth every 12 (twelve) hours as needed for moderate pain. ) 20 tablet 0   No current facility-administered medications on file prior to visit.    Allergies  Allergen Reactions   Pork-Derived Products Hives    Social History   Socioeconomic History   Marital status: Widowed    Spouse name: Not on file   Number of children: Not on file   Years of education: Not on file   Highest education level: Not on file  Occupational History   Not on file  Tobacco Use   Smoking status: Current Every Day Smoker    Packs/day: 0.75    Years: 40.00    Pack years: 30.00    Types: Cigarettes   Smokeless tobacco: Never Used  Vaping Use   Vaping Use: Never used    Substance and Sexual Activity   Alcohol use: Yes    Comment: occasional   Drug use: No   Sexual activity: Yes    Birth control/protection: Post-menopausal    Comment: 1st intercourse 59 yo-More than 5 partners  Other Topics Concern   Not on file  Social History Narrative   Lives with niece and her 3 children in a 2 story home.     Only goes upstairs once a day.  Has 1 child.  Does not work.  Trying to get disability.  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     Education: 10th grade.   Right Handed   Social Determinants of Radio broadcast assistant  Strain:    Difficulty of Paying Living Expenses: Not on file  Food Insecurity:    Worried About [place] in the Last Year: Not on file   Ran Out of Food in the Last Year: Not on file  Transportation Needs:    Lack of Transportation (Medical): Not on file   Lack of Transportation (Non-Medical): Not on file  Physical Activity:    Days of Exercise per Week: Not on file   Minutes of Exercise per Session: Not on file  Stress:    Feeling of Stress : Not on file  Social Connections:    Frequency of Communication with Friends and Family: Not on file   Frequency of Social Gatherings with Friends and Family: Not on file   Attends Religious Services: Not on file   Active Member of Clubs or Organizations: Not on file   Attends Archivist Meetings: Not on file   Marital Status: Not on file  Intimate Partner Violence:    Fear of Current or Ex-Partner: Not on file   Emotionally Abused: Not on file   Physically Abused: Not on file   Sexually Abused: Not on file    Family History  Problem Relation Age of Onset   Cancer Mother        Deceased-Stomach   Hypertension Mother    Other Father        Deceased, 46   HIV/AIDS Brother        Deceased   Multiple sclerosis Daughter    Breast cancer Neg Hx     Past Surgical History:  Procedure Laterality Date   FOOT SURGERY  Right    LEFT HEART CATH AND CORONARY ANGIOGRAPHY N/A 08/22/2018   Procedure: LEFT HEART CATH AND CORONARY ANGIOGRAPHY;  Surgeon: Sherren Mocha, MD;  Location: [facility];  Service: Cardiovascular;  Laterality: N/A;    ROS: Review of Systems Negative except as stated above  PHYSICAL EXAM: BP 138/82    Pulse 82    Temp 97.8 F (36.6 C)    Resp 16    Wt 261 lb 9.6 oz (118.7 kg)    LMP 04/21/2014    SpO2 95%    BMI 44.90 kg/m   Physical Exam Pulse ox room air sitting 94% Pulse ox ambulation: 90 to 95% the majority of the time. She did deep to 87% when she first started walking but that lasted less than a minute. General appearance - alert, well appearing, and in no distress Mental status - normal mood, behavior, speech, dress, motor activity, and thought processes Neck - supple, no significant adenopathy Chest -fine crackles heard at both bases.  Heart - normal rate, regular rhythm, normal S1, S2, no murmurs, rubs, clicks or gallops Extremities - peripheral pulses normal, no pedal edema, no clubbing or cyanosis   Lab Results  Component Value Date   HGBA1C 7.0 (H) 04/09/2020    CMP Latest Ref Rng & Units 04/13/2020 04/12/2020 04/11/2020  Glucose 70 - 99 mg/dL 163(H) 113(H) 122(H)  BUN 6 - 20 mg/dL _0 Creatinine 0.44 - 1.00 mg/dL 1.06(H) 0.99 1.15(H)  Sodium 135 - 145 mmol/L 135 137 137  Potassium 3.5 - 5.1 mmol/L 3.7 4.1 4.6  Chloride 98 - 111 mmol/L 101 104 103  CO2 22 - 32 mmol/L _1 Calcium 8.9 - 10.3 mg/dL 8.4(L) 8.7(L) 8.8(L)  Total Protein 6.5 - 8.1 g/dL 6.4(L) 6.4(L) -  Total Bilirubin 0.3 - 1.2 mg/dL  0.8 0.6 -  Alkaline Phos 38 - 126 U/L 61 59 -  AST 15 - 41 U/L 30 32 -  ALT 0 - 44 U/L 34 30 -   Lipid Panel     Component Value Date/Time   CHOL 153 10/09/2019 1136   TRIG 57 10/09/2019 1136   HDL 66 10/09/2019 1136   CHOLHDL 2.3 10/09/2019 1136   CHOLHDL 3.1 08/21/2018 1442   VLDL 6 08/21/2018 1442   LDLCALC 75 10/09/2019 1136    CBC      Component Value Date/Time   WBC 5.2 04/12/2020 2146   RBC 5.60 (H) 04/12/2020 2146   HGB 16.2 (H) 04/12/2020 2146   HGB 16.2 (H) 10/09/2019 1136   HCT 50.5 (H) 04/12/2020 2146   HCT 48.4 (H) 10/09/2019 1136   PLT 255 04/12/2020 2146   PLT 242 10/09/2019 1136   MCV 90.2 04/12/2020 2146   MCV 89 10/09/2019 1136   MCH 28.9 04/12/2020 2146   MCHC 32.1 04/12/2020 2146   RDW 13.2 04/12/2020 2146   RDW 12.3 10/09/2019 1136   LYMPHSABS 0.7 04/09/2020 0850   MONOABS 0.2 04/09/2020 0850   EOSABS 0.0 04/09/2020 0850   BASOSABS 0.0 04/09/2020 0850    ASSESSMENT AND PLAN: 1. SOB (shortness of breath) -Some shortness of breath with exertion that has persisted post Covid pneumonia. I think deconditioning also plays a role here. She does have some crackles heard at both bases of the lungs. We will get chest x-ray and further management will be based on results. For now she will keep the home oxygen to use with ambulation and we will reevaluate her again in several weeks. - DG Chest 2 View; Future  2. Pneumonia due to COVID-19 virus See #1 above - DG Chest 2 View; Future  3. Tobacco abuse Strongly advised to quit. She is aware of the health risks associated with smoking. She feels she is under too much stress to quit at this time. Less than 5 minutes spent on counseling.  4. Essential hypertension Not at goal but she has not taken her medicines as yet for today. She will take them as soon as she gets home  5. Stressful life event affecting family I recommend that she consider doing some counseling to learn methods to help cope with stress. She feels she would benefit from this. Referral submitted - Ambulatory referral to Psychiatry  6. Need for influenza vaccination Given today.  7. Other polyneuropathy Dose of Lyrica increased to 50 mg twice a day. - pregabalin (LYRICA) 50 MG capsule; Take 1 capsule (50 mg total) by mouth 2 (two) times daily.  Dispense: 60 capsule; Refill:  6   Patient was given the opportunity to ask questions.  Patient verbalized understanding of the plan and was able to repeat key elements of the plan.   Orders Placed This Encounter  Procedures   DG Chest 2 View   Ambulatory referral to Psychiatry     Requested Prescriptions   Signed Prescriptions Disp Refills   pregabalin (LYRICA) 50 MG capsule 60 capsule 6    Sig: Take 1 capsule (50 mg total) by mouth 2 (two) times daily.    Return in about 6 weeks (around 07/03/2020) for Oxygen level recheck.  Karle Plumber, MD, FACP

## 2020-05-22 NOTE — Patient Instructions (Addendum)
MRI brain and cervical spine wwo contrast  Start physical therapy  Return to clinic in 6 month

## 2020-05-22 NOTE — Telephone Encounter (Signed)
-----   Message from [name], DO sent at 05/22/2020  9:29 AM EDT ----- She would need to talk to her PCP about pain, since this is related to her fibromyalgia, which we don't manage. ----- Message ----- From: Nira Conn, CMA Sent: 05/22/2020   9:17 AM EDT To: [name], DO  Prior to patient leaving she wanted to ask you if she could ger something for pain in her legs. She stated the pain is so bad at night that she can barely sleep. I informed her I would send you a message since her visit was complete and you were with patients.

## 2020-06-05 ENCOUNTER — Ambulatory Visit: Payer: Medicare Other | Admitting: Neurology

## 2020-06-08 ENCOUNTER — Telehealth: Payer: Self-pay | Admitting: *Deleted

## 2020-06-08 NOTE — Telephone Encounter (Signed)
  Patient Consent for Virtual Visit         Tiffany Brady has provided verbal consent on 06/08/2020 for a virtual visit (video or telephone).   CONSENT FOR VIRTUAL VISIT FOR:  Tiffany Brady  By participating in this virtual visit I agree to the following:  I hereby voluntarily request, consent and authorize [facility] and its employed or contracted physicians, physician assistants, nurse practitioners or other licensed health care professionals (the Practitioner), to provide me with telemedicine health care services (the "Services") as deemed necessary by the treating Practitioner. I acknowledge and consent to receive the Services by the Practitioner via telemedicine. I understand that the telemedicine visit will involve communicating with the Practitioner through live audiovisual communication technology and the disclosure of certain medical information by electronic transmission. I acknowledge that I have been given the opportunity to request an in-person assessment or other available alternative prior to the telemedicine visit and am voluntarily participating in the telemedicine visit.  I understand that I have the right to withhold or withdraw my consent to the use of telemedicine in the course of my care at any time, without affecting my right to future care or treatment, and that the Practitioner or I may terminate the telemedicine visit at any time. I understand that I have the right to inspect all information obtained and/or recorded in the course of the telemedicine visit and may receive copies of available information for a reasonable fee.  I understand that some of the potential risks of receiving the Services via telemedicine include:  [place] Delay or interruption in medical evaluation due to technological equipment failure or disruption; . Information transmitted may not be sufficient (e.g. poor resolution of images) to allow for appropriate medical decision making by the Practitioner;  and/or  . In rare instances, security protocols could fail, causing a breach of personal health information.  Furthermore, I acknowledge that it is my responsibility to provide information about my medical history, conditions and care that is complete and accurate to the best of my ability. I acknowledge that Practitioner's advice, recommendations, and/or decision may be based on factors not within their control, such as incomplete or inaccurate data provided by me or distortions of diagnostic images or specimens that may result from electronic transmissions. I understand that the practice of medicine is not an exact science and that Practitioner makes no warranties or guarantees regarding treatment outcomes. I acknowledge that a copy of this consent can be made available to me via my patient portal ([facility]), or I can request a printed copy by calling the office of [facility].    I understand that my insurance will be billed for this visit.   I have read or had this consent read to me. . I understand the contents of this consent, which adequately explains the benefits and risks of the Services being provided via telemedicine.  . I have been provided ample opportunity to ask questions regarding this consent and the Services and have had my questions answered to my satisfaction. . I give my informed consent for the services to be provided through the use of telemedicine in my medical care

## 2020-06-08 NOTE — Progress Notes (Signed)
Telehealth Visit     Virtual Visit via Telephone Note   This visit type was conducted due to national recommendations for restrictions regarding the COVID-19 Pandemic (e.g. social distancing) in an effort to limit this patient's exposure and mitigate transmission in our community.  Due to her co-morbid illnesses, this patient is at least at moderate risk for complications without adequate follow up.  This format is felt to be most appropriate for this patient at this time.  The patient did not have access to video technology/had technical difficulties with video requiring transitioning to audio format only (telephone).  All issues noted in this document were discussed and addressed.  No physical exam could be performed with this format.  Please refer to the patient's chart for her  consent to telehealth for [facility].   Evaluation Performed:  Follow-up visit   The patient was identified using 2 identifiers.   This visit type was conducted due to national recommendations for restrictions regarding the COVID-19 Pandemic (e.g. social distancing).  This format is felt to be most appropriate for this patient at this time.  All issues noted in this document were discussed and addressed.  No physical exam was performed (except for noted visual exam findings with Video Visits).  Please refer to the patient's chart (MyChart message for video visits and phone note for telephone visits) for the patient's consent to telehealth for [facility].  Date:  06/15/2020   ID:  Tiffany Brady, DOB [birth year], MRN [id number]  Patient Location:  Home  Provider location:   Home  PCP:  Ladell Pier, MD  Cardiologist:  Servando Snare Sinclair Grooms, MD  Electrophysiologist:  None   Chief Complaint:  Follow up  History of Present Illness:    Tiffany Brady is a 59 y.o. female who presents via audio/video conferencing for a telehealth visit today.  Seen for Dr. Tamala Julian.   She has a history of CAD  with prior NSTEMI in 08/2018 with cardiac cath, pre DM, HLD, ongoing tobaccouse, morbid obesity, HTN, gout,chronic pain syndromeand MS.   Last seen in June of 2020 by Dr. Tamala Julian.I saw her back in January - several issues noted that included atypical chest pain pain, still smoking, feeling weak, etc. BP was up - I was not convinced she was taking her medicines as prescribed. Metoprolol was increased. I last saw her back in March - she was felt to be stable - still smoking. BP and HR better.   The patient does not have symptoms concerning for COVID-19 infection (fever, chills, cough, or new shortness of breath).   Seen today by telephone call - she did not wish to have video. She has consented for this visit. She says she is ok. She has had COVID - was out in [place] and came back on a bus - this was in July - she got vaccinated after that. She was in the hospital at [facility] - she remains on oxygen and will be rechecked in about 6 weeks - hopeful to get off. She says her heart is ok - but she was helping someone get up - then she felt like someone hit her in her chest - hurt with deep breath - this is all resolved and her chest is fine now. She is not able to check her BP at home. She continues to smoke. Overall, she feels like she is ok and has no real concerns.   Past Medical History:  Diagnosis Date  . Chronic kidney  disease    pt reported kidney failure  . Chronic pain syndrome   . Gout   . Hypertension   . MS (multiple sclerosis) ([place])   . Tobacco use    Past Surgical History:  Procedure Laterality Date  . FOOT SURGERY Right   . LEFT HEART CATH AND CORONARY ANGIOGRAPHY N/A 08/22/2018   Procedure: LEFT HEART CATH AND CORONARY ANGIOGRAPHY;  Surgeon: Sherren Mocha, MD;  Location: [facility];  Service: Cardiovascular;  Laterality: N/A;     Current Meds  Medication Sig  . Accu-Chek Softclix Lancets lancets AS DIRECTED DAILY  . AMBULATORY NON FORMULARY MEDICATION 4-wheeled rollator   . amLODipine (NORVASC) 5 MG tablet TAKE ONE TABLET BY MOUTH DAILY  . APO-VARENICLINE 1 MG tablet TAKE ONE TABLET BY MOUTH TWICE A DAY  . aspirin EC 81 MG tablet Take 1 tablet (81 mg total) by mouth daily.  Marland Kitchen atorvastatin (LIPITOR) 80 MG tablet TAKE ONE TABLET BY MOUTH DAILY AT 6PM (Patient taking differently: Take 80 mg by mouth every evening. )  . Blood Glucose Monitoring Suppl (TRUE METRIX GO GLUCOSE METER) w/Device KIT 1 each by Does not apply route every 8 (eight) hours as needed.  . cholecalciferol (VITAMIN D3) 25 MCG (1000 UT) tablet Take 5,000 Units by mouth daily.  . clopidogrel (PLAVIX) 75 MG tablet TAKE ONE TABLET BY MOUTH DAILY (Patient taking differently: Take 75 mg by mouth daily. )  . cyclobenzaprine (FLEXERIL) 10 MG tablet TAKE 1 TABLET (10 MG TOTAL) BY MOUTH TWO (TWO) TIMES DAILY AS NEEDED FOR MUSCLE SPASMS.  Marland Kitchen diclofenac sodium (VOLTAREN) 1 % GEL APPLY TWO GRAMS TOPICALLY FOUR TIMES A DAY (Patient taking differently: Apply 2 g topically 4 (four) times daily as needed (pain). )  . Dimethyl Fumarate (TECFIDERA) 240 MG CPDR Take 1 capsule (240 mg total) by mouth 2 (two) times daily.  . DULoxetine (CYMBALTA) 30 MG capsule Take 1 capsule (30 mg total) by mouth daily. Must have office visit for refills  . ezetimibe (ZETIA) 10 MG tablet TAKE ONE TABLET BY MOUTH DAILY (Patient taking differently: Take 10 mg by mouth daily. )  . fluticasone (FLONASE) 50 MCG/ACT nasal spray PLACE ONE SPRAY INTO BOTH NOSTRILS DAILY AS NEEDED FOR ALLERGIES OR RHINITIS (Patient taking differently: Place 1 spray into both nostrils daily as needed for allergies. )  . glucose blood (TRUE METRIX BLOOD GLUCOSE TEST) test strip Check blood sugar once a day  . losartan (COZAAR) 25 MG tablet Take 1 tablet (25 mg total) by mouth daily. Must have office visit for refills  . metoprolol tartrate (LOPRESSOR) 50 MG tablet Take 0.5 tablets (25 mg total) by mouth 2 (two) times daily.  . nitroGLYCERIN (NITROSTAT) 0.4 MG SL  tablet Place 1 tablet (0.4 mg total) under the tongue every 5 (five) minutes x 3 doses as needed for chest pain.  . NON FORMULARY Place into the nose continuous. 3 L of O2 in the pm  . pregabalin (LYRICA) 50 MG capsule Take 1 capsule (50 mg total) by mouth 2 (two) times daily.  . traMADol (ULTRAM) 50 MG tablet Take 1 tablet (50 mg total) by mouth every 12 (twelve) hours as needed. (Patient taking differently: Take 50 mg by mouth every 12 (twelve) hours as needed for moderate pain. )     Allergies:   Pork-derived products   Social History   Tobacco Use  . Smoking status: Current Every Day Smoker    Packs/day: 0.75    Years: 40.00  Pack years: 30.00    Types: Cigarettes  . Smokeless tobacco: Never Used  Vaping Use  . Vaping Use: Never used  Substance Use Topics  . Alcohol use: Yes    Comment: occasional  . Drug use: No     Family Hx: The patient's family history includes Cancer in her mother; HIV/AIDS in her brother; Hypertension in her mother; Multiple sclerosis in her daughter; Other in her father. There is no history of Breast cancer.  ROS:   Please see the history of present illness.   All other systems reviewed are negative.    Objective:    Vital Signs:  Ht 5' 4"  (1.626 m)   Wt 260 lb (117.9 kg)   LMP 04/21/2014   BMI 44.63 kg/m    Wt Readings from Last 3 Encounters:  06/15/20 260 lb (117.9 kg)  05/22/20 261 lb 9.6 oz (118.7 kg)  05/22/20 262 lb (118.8 kg)    Alert female in no acute distress. Sounds appropriate. Looks good. Not short of breath with conversation. She weighed 269# when I last saw her in March.    Labs/Other Tests and Data Reviewed:    Lab Results  Component Value Date   WBC 5.2 04/12/2020   HGB 16.2 (H) 04/12/2020   HCT 50.5 (H) 04/12/2020   PLT 255 04/12/2020   GLUCOSE 163 (H) 04/13/2020   CHOL 153 10/09/2019   TRIG 57 10/09/2019   HDL 66 10/09/2019   LDLCALC 75 10/09/2019   ALT 34 04/13/2020   AST 30 04/13/2020   NA 135  04/13/2020   K 3.7 04/13/2020   CL 101 04/13/2020   CREATININE 1.06 (H) 04/13/2020   BUN 16 04/13/2020   CO2 24 04/13/2020   TSH 0.415 04/10/2020   INR 1.4 (H) 04/10/2020   HGBA1C 7.0 (H) 04/09/2020   MICROALBUR 0.3 02/01/2016     BNP (last 3 results) Recent Labs    04/09/20 0606  BNP 33.5    ProBNP (last 3 results) No results for input(s): PROBNP in the last 8760 hours.    Prior CV studies:    The following studies were reviewed today:   LEFT HEART CATH AND CORONARY ANGIOGRAPHY12/2019  Conclusion    Ost Ramus to Ramus lesion is 90% stenosed.  A drug-eluting stent was successfully placed using a STENT SYNERGY DES 2.75X16.  Post intervention, there is a 0% residual stenosis.  1. 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 Mild nonobstructive dz of the LAD, circumflex, and RCA 3. Normal LV function by echo    EchoStudy Conclusions12/2019  - Left ventricle: The cavity size was normal. Systolic function was normal. The estimated ejection fraction was in the range of 60% to 65%. Wall motion was normal; there were no regional wall motion abnormalities. Doppler parameters are consistent with abnormal left ventricular relaxation (grade 1 diastolic dysfunction).  Impressions:  - Compared to the prior study, there has been no significant interval change.      ASSESSMENT & PLAN:    1. CAD - remains on DAPT - no worrisome symptoms noted. Has tended to have some chronic chest pain. She struggles with risk factor modification.   2. HTN - unknown status.   3. HLD - on statin therapy - would continue  4. Ongoing tobacco use - complete cessation recommended.   5. Prior COVID 19 illness - required hospitalization - she has now been vaccinated.   6.  Obesity  7. MS - not discussed  8. Chronic pain - not discussed.    Patient Risk:   After full  review of this patient's clinical status, I feel that they are at least moderate risk at this time.  Time:   Today, I have spent 5 minutes with the patient with telehealth technology discussing the above issues.     Medication Adjustments/Labs and Tests Ordered: Current medicines are reviewed at length with the patient today.  Concerns regarding medicines are outlined above.   Tests Ordered: No orders of the defined types were placed in this encounter.   Medication Changes: No orders of the defined types were placed in this encounter.   Disposition:  FU with Dr. Tamala Julian in 6 months.      Patient is agreeable to this plan and will call if any problems develop in the interim.   Amie Critchley, NP  06/15/2020 10:49 AM    [place]

## 2020-06-15 ENCOUNTER — Telehealth (INDEPENDENT_AMBULATORY_CARE_PROVIDER_SITE_OTHER): Payer: Medicare Other | Admitting: Nurse Practitioner

## 2020-06-15 ENCOUNTER — Other Ambulatory Visit: Payer: Self-pay

## 2020-06-15 ENCOUNTER — Encounter: Payer: Self-pay | Admitting: Nurse Practitioner

## 2020-06-15 VITALS — Ht 64.0 in | Wt 260.0 lb

## 2020-06-15 DIAGNOSIS — I2510 Atherosclerotic heart disease of native coronary artery without angina pectoris: Secondary | ICD-10-CM | POA: Diagnosis not present

## 2020-06-15 DIAGNOSIS — I10 Essential (primary) hypertension: Secondary | ICD-10-CM

## 2020-06-15 DIAGNOSIS — Z720 Tobacco use: Secondary | ICD-10-CM

## 2020-06-15 DIAGNOSIS — Z79899 Other long term (current) drug therapy: Secondary | ICD-10-CM | POA: Diagnosis not present

## 2020-06-15 NOTE — Patient Instructions (Addendum)
After Visit Summary:  We will be checking the following labs today - NONE   Medication Instructions:    Continue with your current medicines.    If you need a refill on your cardiac medications before your next appointment, please call your pharmacy.     Testing/Procedures To Be Arranged:  N/A  Follow-Up:   See Dr. Smith in about 6 months.     At [facility], you and your health needs are our priority.  As part of our continuing mission to provide you with exceptional heart care, we have created designated Provider Care Teams.  These Care Teams include your primary Cardiologist (physician) and Advanced Practice Providers (APPs -  Physician Assistants and Nurse Practitioners) who all work together to provide you with the care you need, when you need it.  Special Instructions:  . Stay safe, wash your hands for at least 20 seconds and wear a mask when needed.  . It was good to talk with you today.    Call the [facility] office at [phone] if you have any questions, problems or concerns.       

## 2020-06-17 ENCOUNTER — Telehealth: Payer: Self-pay | Admitting: Internal Medicine

## 2020-06-17 NOTE — Telephone Encounter (Signed)
Open in error

## 2020-06-18 ENCOUNTER — Other Ambulatory Visit: Payer: Self-pay | Admitting: Internal Medicine

## 2020-06-18 NOTE — Telephone Encounter (Signed)
Requested medication (s) are due for refill today: yes  Requested medication (s) are on the active medication list: yes  Last refill:  05/20/2020  Future visit scheduled: no  Notes to clinic:  this refill cannot be delegated    Requested Prescriptions  Pending Prescriptions Disp Refills   pregabalin (LYRICA) 25 MG capsule [Pharmacy Med Name: PREGABALIN 25MG  CAP] 60 capsule 0    Sig: TAKE ONE CAPSULE BY MOUTH TWICE A DAY      Not Delegated - Neurology:  Anticonvulsants - Controlled Failed - 06/18/2020 12:53 PM      Failed - This refill cannot be delegated      Passed - Valid encounter within last 12 months    Recent Outpatient Visits           3 weeks ago SOB (shortness of breath)   [place] Ladell Pier, MD   1 month ago Hospital discharge follow-up   [place], MD   5 months ago Controlled type 2 diabetes mellitus with other circulatory complication, without long-term current use of insulin [facility])   [place] Karle Plumber B, MD   9 months ago Controlled type 2 diabetes mellitus with other circulatory complication, without long-term current use of insulin [facility])   [place], MD   10 months ago Postmenopausal bleeding   [place], MD       Future Appointments             In 5 months Tamala Julian, Lynnell Dike, MD [place], [facility]

## 2020-07-30 ENCOUNTER — Other Ambulatory Visit: Payer: Self-pay | Admitting: Internal Medicine

## 2020-07-30 DIAGNOSIS — I10 Essential (primary) hypertension: Secondary | ICD-10-CM

## 2020-07-30 NOTE — Telephone Encounter (Signed)
Requested Prescriptions  Pending Prescriptions Disp Refills   amLODipine (NORVASC) 5 MG tablet [Pharmacy Med Name: amLODIPine BESYLATE 5MG  TAB] 90 tablet 0    Sig: TAKE ONE TABLET BY MOUTH DAILY     Cardiovascular:  Calcium Channel Blockers Passed - 07/30/2020 11:52 AM      Passed - Last BP in normal range    BP Readings from Last 1 Encounters:  05/22/20 138/82         Passed - Valid encounter within last 6 months    Recent Outpatient Visits          2 months ago SOB (shortness of breath)   [place] Ladell Pier, MD   3 months ago Hospital discharge follow-up   [place], Deborah B, MD   7 months ago Controlled type 2 diabetes mellitus with other circulatory complication, without long-term current use of insulin [facility])   [place] Karle Plumber B, MD   10 months ago Controlled type 2 diabetes mellitus with other circulatory complication, without long-term current use of insulin [facility])   [place], MD   11 months ago Postmenopausal bleeding   [place], MD      Future Appointments            In 3 months Tamala Julian, Lynnell Dike, MD [place], [facility]

## 2020-08-31 ENCOUNTER — Other Ambulatory Visit: Payer: Self-pay | Admitting: Obstetrics and Gynecology

## 2020-08-31 DIAGNOSIS — Z1231 Encounter for screening mammogram for malignant neoplasm of breast: Secondary | ICD-10-CM

## 2020-09-07 ENCOUNTER — Other Ambulatory Visit: Payer: Self-pay | Admitting: Internal Medicine

## 2020-09-07 ENCOUNTER — Other Ambulatory Visit: Payer: Self-pay | Admitting: Interventional Cardiology

## 2020-09-07 ENCOUNTER — Other Ambulatory Visit: Payer: Self-pay | Admitting: Family Medicine

## 2020-09-07 DIAGNOSIS — G35 Multiple sclerosis: Secondary | ICD-10-CM

## 2020-09-07 NOTE — Telephone Encounter (Signed)
Requested medication (s) are due for refill today: no  Requested medication (s) are on the active medication list: yes  Last refill:  06/20/20   Future visit scheduled: no  Notes to clinic:  NT not delegated to refill or refuse this med   Requested Prescriptions  Pending Prescriptions Disp Refills   pregabalin (LYRICA) 25 MG capsule [Pharmacy Med Name: PREGABALIN 25MG  CAP] 60 capsule     Sig: TAKE ONE CAPSULE BY MOUTH TWICE A DAY      Not Delegated - Neurology:  Anticonvulsants - Controlled Failed - 09/07/2020  3:21 PM      Failed - This refill cannot be delegated      Passed - Valid encounter within last 12 months    Recent Outpatient Visits           3 months ago SOB (shortness of breath)   [place] Ladell Pier, MD   4 months ago Hospital discharge follow-up   [place], MD   8 months ago Controlled type 2 diabetes mellitus with other circulatory complication, without long-term current use of insulin ([place])   [place] Karle Plumber B, MD   11 months ago Controlled type 2 diabetes mellitus with other circulatory complication, without long-term current use of insulin ([place])   [place], Deborah B, MD   1 year ago Postmenopausal bleeding   [place], Deborah B, MD       Future Appointments             In 2 months Joseph Pierini, MD [facility], [place]   In 2 months Belva Crome, MD [place], [facility]              Refused Prescriptions Disp Refills   varenicline ([place]) 1 MG tablet [Pharmacy Med Name: VARENICLINE TARTRATE 1MG  TAB] 180 tablet     Sig: TAKE ONE TABLET BY MOUTH TWICE A DAY      Psychiatry:  Drug Dependence Therapy Passed - 09/07/2020  3:21 PM      Passed - Valid encounter within last 12 months     Recent Outpatient Visits           3 months ago SOB (shortness of breath)   [place] Ladell Pier, MD   4 months ago Hospital discharge follow-up   [place] Karle Plumber B, MD   8 months ago Controlled type 2 diabetes mellitus with other circulatory complication, without long-term current use of insulin [facility])   [place] Karle Plumber B, MD   11 months ago Controlled type 2 diabetes mellitus with other circulatory complication, without long-term current use of insulin [facility])   [place], Deborah B, MD   1 year ago Postmenopausal bleeding   [place], Deborah B, MD       Future Appointments             In 2 months Joseph Pierini, MD [facility], [place]   In 2 months Belva Crome, MD [facility], [facility]

## 2020-09-07 NOTE — Telephone Encounter (Signed)
Requested Prescriptions  Pending Prescriptions Disp Refills  . pregabalin (LYRICA) 25 MG capsule [Pharmacy Med Name: PREGABALIN 25MG  CAP] 60 capsule     Sig: TAKE ONE CAPSULE BY MOUTH TWICE A DAY     Not Delegated - Neurology:  Anticonvulsants - Controlled Failed - 09/07/2020  3:21 PM      Failed - This refill cannot be delegated      Passed - Valid encounter within last 12 months    Recent Outpatient Visits          3 months ago SOB (shortness of breath)   [facility] 09/09/2020, MD   4 months ago Hospital discharge follow-up   [facility] B, MD   8 months ago Controlled type 2 diabetes mellitus with other circulatory complication, without long-term current use of insulin (HCC)   [place] [facility] B, MD   11 months ago Controlled type 2 diabetes mellitus with other circulatory complication, without long-term current use of insulin (HCC)   [place] [facility] Jonah Blue, MD   1 year ago Postmenopausal bleeding   [place] Marcine Matar, MD      Future Appointments            In 2 months Marcine Matar, MD [facility], [place]   In 2 months [place], MD [facility] Office, [facility]           . varenicline (CHANTIX) 1 MG tablet [Pharmacy Med Name: VARENICLINE TARTRATE 1MG  TAB] 180 tablet     Sig: TAKE ONE TABLET BY MOUTH TWICE A DAY     Psychiatry:  Drug Dependence Therapy Passed - 09/07/2020  3:21 PM      Passed - Valid encounter within last 12 months    Recent Outpatient Visits          3 months ago SOB (shortness of breath)   [place] [facility] 09/09/2020, MD   4 months ago Hospital discharge follow-up   [facility] Marcine Matar B, MD   8 months ago Controlled type 2 diabetes  mellitus with other circulatory complication, without long-term current use of insulin [facility])   [place] [facility] B, MD   11 months ago Controlled type 2 diabetes mellitus with other circulatory complication, without long-term current use of insulin [facility])   [place] [facility], MD   1 year ago Postmenopausal bleeding   [place] [facility], MD      Future Appointments            In 2 months Marcine Matar, MD [facility] [place], [place]   In 2 months [facility], MD [facility], [facility]

## 2020-09-07 NOTE — Telephone Encounter (Signed)
Requested medication (s) are due for refill today: yes  Requested medication (s) are on the active medication list: yes   Last refill:  04/17/20 #60 2 refills   Future visit scheduled: no  Notes to clinic:  not delegated per protocol     Requested Prescriptions  Pending Prescriptions Disp Refills   cyclobenzaprine (FLEXERIL) 10 MG tablet [Pharmacy Med Name: CYCLOBENZAPRINE HYDR 10MG  TAB] 60 tablet 1    Sig: TAKE ONE TABLET BY MOUTH TWICE A DAY AS NEEDED FOR MUSCLE SPASMS      Not Delegated - Analgesics:  Muscle Relaxants Failed - 09/07/2020  4:35 PM      Failed - This refill cannot be delegated      Passed - Valid encounter within last 6 months    Recent Outpatient Visits           3 months ago SOB (shortness of breath)   [place] Ladell Pier, MD   4 months ago Hospital discharge follow-up   [place], Deborah B, MD   8 months ago Controlled type 2 diabetes mellitus with other circulatory complication, without long-term current use of insulin [facility])   [place] Karle Plumber B, MD   11 months ago Controlled type 2 diabetes mellitus with other circulatory complication, without long-term current use of insulin [facility])   [place], Deborah B, MD   1 year ago Postmenopausal bleeding   [place], Deborah B, MD       Future Appointments             In 2 months Joseph Pierini, MD [facility], [place]   In 2 months Belva Crome, MD [facility], [facility]

## 2020-09-08 ENCOUNTER — Ambulatory Visit (INDEPENDENT_AMBULATORY_CARE_PROVIDER_SITE_OTHER): Payer: Medicare Other | Admitting: Obstetrics and Gynecology

## 2020-09-08 ENCOUNTER — Other Ambulatory Visit: Payer: Self-pay

## 2020-09-08 ENCOUNTER — Other Ambulatory Visit: Payer: Self-pay | Admitting: Neurology

## 2020-09-08 ENCOUNTER — Encounter: Payer: Self-pay | Admitting: Obstetrics and Gynecology

## 2020-09-08 VITALS — BP 130/86

## 2020-09-08 DIAGNOSIS — N644 Mastodynia: Secondary | ICD-10-CM | POA: Diagnosis not present

## 2020-09-08 NOTE — Progress Notes (Signed)
Tiffany Brady 10/19/1960 [id number]  SUBJECTIVE:  59 y.o. G71P0011 female presents for evaluation with transient right nipple tenderness.  She felt this the other week and then it only lasted for a day or two and then went away.  Not currently having any symptoms.  She says she keeps up on doing regular self breast exams and has not noted any lumps or bumps or other changes.  Not currently having any breast pain on either side.  She says she used to get some nipple tenderness when she was on her menstrual cycle when she was premenopausal.  Last screening mammogram was 07/2019.  Current Outpatient Medications  Medication Sig Dispense Refill  . Accu-Chek Softclix Lancets lancets AS DIRECTED DAILY 100 each 11  . AMBULATORY NON FORMULARY MEDICATION 4-wheeled rollator 1 each 0  . amLODipine (NORVASC) 5 MG tablet TAKE ONE TABLET BY MOUTH DAILY 90 tablet 0  . APO-VARENICLINE 1 MG tablet TAKE ONE TABLET BY MOUTH TWICE A DAY 180 tablet 1  . aspirin EC 81 MG tablet Take 1 tablet (81 mg total) by mouth daily. 100 tablet 1  . atorvastatin (LIPITOR) 80 MG tablet TAKE ONE TABLET BY MOUTH DAILY AT 6PM (Patient taking differently: Take 80 mg by mouth every evening.) 90 tablet 3  . Blood Glucose Monitoring Suppl (TRUE METRIX GO GLUCOSE METER) w/Device KIT 1 each by Does not apply route every 8 (eight) hours as needed. 1 kit 0  . cholecalciferol (VITAMIN D3) 25 MCG (1000 UT) tablet Take 5,000 Units by mouth daily.    . clopidogrel (PLAVIX) 75 MG tablet TAKE ONE TABLET BY MOUTH DAILY (Patient taking differently: Take 75 mg by mouth daily.) 30 tablet 11  . cyclobenzaprine (FLEXERIL) 10 MG tablet TAKE ONE TABLET BY MOUTH TWICE A DAY AS NEEDED FOR MUSCLE SPASMS 60 tablet 1  . diclofenac sodium (VOLTAREN) 1 % GEL APPLY TWO GRAMS TOPICALLY FOUR TIMES A DAY (Patient taking differently: Apply 2 g topically 4 (four) times daily as needed (pain).) 100 g 0  . DULoxetine (CYMBALTA) 30 MG capsule Take 1 capsule (30 mg total)  by mouth daily. Must have office visit for refills 30 capsule 0  . ezetimibe (ZETIA) 10 MG tablet TAKE ONE TABLET BY MOUTH DAILY 90 tablet 3  . fluticasone (FLONASE) 50 MCG/ACT nasal spray PLACE ONE SPRAY INTO BOTH NOSTRILS DAILY AS NEEDED FOR ALLERGIES OR RHINITIS (Patient taking differently: Place 1 spray into both nostrils daily as needed for allergies.) 16 g 0  . glucose blood (TRUE METRIX BLOOD GLUCOSE TEST) test strip Check blood sugar once a day 100 each 12  . losartan (COZAAR) 25 MG tablet Take 1 tablet (25 mg total) by mouth daily. Must have office visit for refills 30 tablet 0  . nitroGLYCERIN (NITROSTAT) 0.4 MG SL tablet Place 1 tablet (0.4 mg total) under the tongue every 5 (five) minutes x 3 doses as needed for chest pain. 25 tablet 12  . NON FORMULARY Place into the nose continuous. 3 L of O2 in the pm    . pregabalin (LYRICA) 25 MG capsule TAKE ONE CAPSULE BY MOUTH TWICE A DAY 60 capsule 6  . pregabalin (LYRICA) 50 MG capsule Take 1 capsule (50 mg total) by mouth 2 (two) times daily. 60 capsule 6  . TECFIDERA 240 MG CPDR TAKE 1 CAPSULE (240MG) BY  MOUTH TWICE DAILY 60 capsule 0  . traMADol (ULTRAM) 50 MG tablet Take 1 tablet (50 mg total) by mouth every 12 (twelve) hours as  needed. (Patient taking differently: Take 50 mg by mouth every 12 (twelve) hours as needed for moderate pain.) 20 tablet 0  . metoprolol tartrate (LOPRESSOR) 50 MG tablet Take 0.5 tablets (25 mg total) by mouth 2 (two) times daily. 180 tablet 3   No current facility-administered medications for this visit.   Allergies: Pork-derived products  Patient's last menstrual period was 04/21/2014.  Past medical history,surgical history, problem list, medications, allergies, family history and social history were all reviewed and documented as reviewed in the EPIC chart.  ROS: Pertinent positives and negatives as reviewed in HPI  OBJECTIVE:  BP 130/86   LMP 04/21/2014  The patient appears well, alert, oriented x 3,  in no distress.  BREAST EXAM: right breast appears normal, no suspicious masses, no skin or nipple changes or axillary nodes, nipple is nontender and normal in appearance, no skin dimpling or retraction  Chaperone: Thurnell Garbe present during the examination  ASSESSMENT:  59 y.o. G2P1011 here for evaluation of right breast, currently asymptomatic  PLAN:  There are no palpable or visible abnormalities on her right breast exam today.  She is currently asymptomatic.  She can proceed with her normal routine screening mammogram and will plan to get that scheduled soon.  If she has any recurrence or persistence of breast concerns/symptoms then she should let us know and return for evaluation.   Joseph Pierini MD 09/08/20

## 2020-09-10 ENCOUNTER — Other Ambulatory Visit: Payer: Self-pay | Admitting: Nurse Practitioner

## 2020-09-13 DIAGNOSIS — U071 COVID-19: Secondary | ICD-10-CM | POA: Diagnosis not present

## 2020-09-30 ENCOUNTER — Other Ambulatory Visit: Payer: Self-pay | Admitting: Neurology

## 2020-10-06 ENCOUNTER — Other Ambulatory Visit: Payer: Self-pay | Admitting: Internal Medicine

## 2020-10-06 DIAGNOSIS — I10 Essential (primary) hypertension: Secondary | ICD-10-CM

## 2020-10-14 DIAGNOSIS — U071 COVID-19: Secondary | ICD-10-CM | POA: Diagnosis not present

## 2020-10-21 ENCOUNTER — Other Ambulatory Visit: Payer: Self-pay | Admitting: Neurology

## 2020-11-04 ENCOUNTER — Other Ambulatory Visit: Payer: Self-pay | Admitting: Physician Assistant

## 2020-11-06 ENCOUNTER — Encounter: Payer: Self-pay | Admitting: Obstetrics and Gynecology

## 2020-11-06 ENCOUNTER — Ambulatory Visit (INDEPENDENT_AMBULATORY_CARE_PROVIDER_SITE_OTHER): Payer: Medicare Other | Admitting: Obstetrics and Gynecology

## 2020-11-06 ENCOUNTER — Other Ambulatory Visit: Payer: Self-pay

## 2020-11-06 VITALS — BP 130/80 | Ht 64.0 in | Wt 278.0 lb

## 2020-11-06 DIAGNOSIS — N898 Other specified noninflammatory disorders of vagina: Secondary | ICD-10-CM

## 2020-11-06 DIAGNOSIS — Z113 Encounter for screening for infections with a predominantly sexual mode of transmission: Secondary | ICD-10-CM | POA: Diagnosis not present

## 2020-11-06 DIAGNOSIS — Z01419 Encounter for gynecological examination (general) (routine) without abnormal findings: Secondary | ICD-10-CM | POA: Diagnosis not present

## 2020-11-06 LAB — WET PREP FOR TRICH, YEAST, CLUE

## 2020-11-06 NOTE — Progress Notes (Signed)
Tiffany Brady 06-13-1961 [id number]   SUBJECTIVE:  60 y.o. G107P1011 female for annual routine breast and pelvic exam.  Still with same partner but has concerns and would like to have STI screen.  Having some vaginal discharge and odor.  No other gynecologic concerns.  Current Outpatient Medications  Medication Sig Dispense Refill  . Accu-Chek Softclix Lancets lancets AS DIRECTED DAILY 100 each 11  . AMBULATORY NON FORMULARY MEDICATION 4-wheeled rollator 1 each 0  . amLODipine (NORVASC) 5 MG tablet TAKE ONE TABLET BY MOUTH DAILY 90 tablet 0  . APO-VARENICLINE 1 MG tablet TAKE ONE TABLET BY MOUTH TWICE A DAY 180 tablet 1  . aspirin EC 81 MG tablet Take 1 tablet (81 mg total) by mouth daily. 100 tablet 1  . atorvastatin (LIPITOR) 80 MG tablet Take 1 tablet (80 mg total) by mouth daily at 6 PM. Pt needs to keep upcoming appt in Mar for further refills 60 tablet 0  . Blood Glucose Monitoring Suppl (TRUE METRIX GO GLUCOSE METER) w/Device KIT 1 each by Does not apply route every 8 (eight) hours as needed. 1 kit 0  . cholecalciferol (VITAMIN D3) 25 MCG (1000 UT) tablet Take 5,000 Units by mouth daily.    . clopidogrel (PLAVIX) 75 MG tablet TAKE ONE TABLET BY MOUTH DAILY (Patient taking differently: Take 75 mg by mouth daily.) 30 tablet 11  . cyclobenzaprine (FLEXERIL) 10 MG tablet TAKE ONE TABLET BY MOUTH TWICE A DAY AS NEEDED FOR MUSCLE SPASMS 60 tablet 1  . diclofenac sodium (VOLTAREN) 1 % GEL APPLY TWO GRAMS TOPICALLY FOUR TIMES A DAY (Patient taking differently: Apply 2 g topically 4 (four) times daily as needed (pain).) 100 g 0  . DULoxetine (CYMBALTA) 30 MG capsule Take 1 capsule (30 mg total) by mouth daily. Must have office visit for refills 30 capsule 0  . ezetimibe (ZETIA) 10 MG tablet TAKE ONE TABLET BY MOUTH DAILY 90 tablet 3  . fluticasone (FLONASE) 50 MCG/ACT nasal spray PLACE ONE SPRAY INTO BOTH NOSTRILS DAILY AS NEEDED FOR ALLERGIES OR RHINITIS (Patient taking differently: Place 1  spray into both nostrils daily as needed for allergies.) 16 g 0  . glucose blood (TRUE METRIX BLOOD GLUCOSE TEST) test strip Check blood sugar once a day 100 each 12  . losartan (COZAAR) 25 MG tablet Take 1 tablet (25 mg total) by mouth daily. Must have office visit for refills 30 tablet 0  . nitroGLYCERIN (NITROSTAT) 0.4 MG SL tablet Place 1 tablet (0.4 mg total) under the tongue every 5 (five) minutes x 3 doses as needed for chest pain. 25 tablet 12  . NON FORMULARY Place into the nose continuous. 3 L of O2 in the pm    . pregabalin (LYRICA) 25 MG capsule TAKE ONE CAPSULE BY MOUTH TWICE A DAY 60 capsule 1  . pregabalin (LYRICA) 50 MG capsule Take 1 capsule (50 mg total) by mouth 2 (two) times daily. 60 capsule 6  . TECFIDERA 240 MG CPDR TAKE 1 CAPSULE (240MG) BY  MOUTH TWICE DAILY 60 capsule 0  . traMADol (ULTRAM) 50 MG tablet Take 1 tablet (50 mg total) by mouth every 12 (twelve) hours as needed. (Patient taking differently: Take 50 mg by mouth every 12 (twelve) hours as needed for moderate pain.) 20 tablet 0  . metoprolol tartrate (LOPRESSOR) 50 MG tablet Take 0.5 tablets (25 mg total) by mouth 2 (two) times daily. 180 tablet 3   No current facility-administered medications for this visit.  Allergies: Pork-derived products  Patient's last menstrual period was 04/21/2014.  Past medical history,surgical history, problem list, medications, allergies, family history and social history were all reviewed and documented as reviewed in the EPIC chart.  ROS:  Feeling well. No dyspnea or chest pain on exertion.  No abdominal pain, change in bowel habits, black or bloody stools.  No urinary tract symptoms. GYN ROS: + postmenopausal bleeding, +discharge and odor, no pelvic pain, no breast pain or new or enlarging lumps on self exam. No neurological complaints.    OBJECTIVE:  BP 130/80 (BP Location: Right Arm, Patient Position: Sitting, Cuff Size: Large)   Ht 5' 4"  (1.626 m)   Wt 278 lb (126.1 kg)    LMP 04/21/2014   BMI 47.72 kg/m  The patient appears well, alert, oriented, in no distress.  BREAST EXAM: breasts appear normal, no suspicious masses, no skin or nipple changes or axillary nodes  PELVIC EXAM: VULVA: normal appearing vulva with no masses, tenderness or lesions, atrophic changes noted, VAGINA: normal appearing vagina with normal color, no lesions, scant thin white yellow discharge, CERVIX: normal appearing cervix without discharge or lesions, no active bleeding, UTERUS & ADNEXA: Difficult to palpate due to body habitus but nontender, WET MOUNT done - results: negative Chaperone: Wandra Scot Bonham present during the examination  ASSESSMENT:  60 y.o. G2P1011 here for annual breast and pelvic exam  PLAN:   1.  Postmenopausal.  Had postmenopausal bleeding last year noted to have thickened endometrium on ultrasound, endometrial biopsy 11/2019 indicated benign endometrium.  No further vaginal bleeding.  No significant menopausal symptoms. 2. Pap smear 2021.  Pap smear not done today.  No significant history of abnormal Pap smears.  Repeat at 3-year interval per the current screening guidelines. 3. Mammogram 07/2019.  Normal breast exam today.  She is reminded to schedule an annual mammogram this year as this is overdue. 4.  Vaginal discharge and odor, desires STI screening  Vaginal wet prep is negative.  She will check with insurance cost and if feasible for her she will go to lab to check HIV, RPR, hepatitis B and C screens.  GC/Chlamydia probe obtained today per her request. 5. Colonoscopy 2016.  Recommended that she follow up at the recommended interval.  6. Baseline DEXA indicated in postmenopausal years.  Discussed last year but not done, will try to have her do it this year.  The patient is aware that I will only be at this practice until early March 2022 so she knows to make sure she requests follow-up on results for any medical tests that she does when I am no longer at the  practice. 7. Health maintenance.  No labs today as she normally has these completed elsewhere. Return annually or sooner, prn.  Joseph Pierini MD  11/06/20

## 2020-11-09 LAB — C. TRACHOMATIS/N. GONORRHOEAE RNA
C. trachomatis RNA, TMA: NOT DETECTED
N. gonorrhoeae RNA, TMA: NOT DETECTED

## 2020-11-11 ENCOUNTER — Other Ambulatory Visit: Payer: Self-pay | Admitting: Neurology

## 2020-11-11 DIAGNOSIS — U071 COVID-19: Secondary | ICD-10-CM | POA: Diagnosis not present

## 2020-11-19 ENCOUNTER — Ambulatory Visit (INDEPENDENT_AMBULATORY_CARE_PROVIDER_SITE_OTHER): Payer: Medicare Other | Admitting: Neurology

## 2020-11-19 ENCOUNTER — Other Ambulatory Visit: Payer: Self-pay | Admitting: Pharmacist

## 2020-11-19 ENCOUNTER — Other Ambulatory Visit: Payer: Self-pay

## 2020-11-19 ENCOUNTER — Other Ambulatory Visit: Payer: Self-pay | Admitting: Internal Medicine

## 2020-11-19 ENCOUNTER — Encounter: Payer: Self-pay | Admitting: Neurology

## 2020-11-19 VITALS — BP 148/92 | HR 82 | Ht 64.0 in | Wt 277.0 lb

## 2020-11-19 DIAGNOSIS — G35 Multiple sclerosis: Secondary | ICD-10-CM | POA: Diagnosis not present

## 2020-11-19 DIAGNOSIS — M797 Fibromyalgia: Secondary | ICD-10-CM

## 2020-11-19 DIAGNOSIS — R292 Abnormal reflex: Secondary | ICD-10-CM

## 2020-11-19 DIAGNOSIS — R2681 Unsteadiness on feet: Secondary | ICD-10-CM | POA: Diagnosis not present

## 2020-11-19 DIAGNOSIS — M654 Radial styloid tenosynovitis [de Quervain]: Secondary | ICD-10-CM

## 2020-11-19 MED ORDER — DICLOFENAC SODIUM 1 % TD GEL: 2.0000 g | Freq: Four times a day (QID) | TRANSDERMAL | 1 refills | Status: DC

## 2020-11-19 MED ORDER — AMBULATORY NON FORMULARY MEDICATION: 0 refills | Status: DC

## 2020-11-19 MED ORDER — DICLOFENAC SODIUM 1 % EX GEL: 2.0000 g | Freq: Four times a day (QID) | CUTANEOUS | 2 refills | Status: AC

## 2020-11-19 MED ORDER — DULOXETINE HCL 30 MG PO CPEP: 30.0000 mg | ORAL_CAPSULE | Freq: Every day | ORAL | 0 refills | Status: DC

## 2020-11-19 NOTE — Addendum Note (Signed)
Addended by: Jake Seats on: 11/19/2020 11:35 AM   Modules accepted: Orders

## 2020-11-19 NOTE — Progress Notes (Signed)
Follow-up Visit   Date: 11/19/20   Tiffany Brady MRN: [id number] DOB: 03-Jan-1961   Interim History: Tiffany Brady is a 60 y.o. right-handed African American female with chronic pain syndrome, fibromyaglia, hypertension, and tobacco use returning to the clinic for follow-up of multiple sclerosis and leg weakness.  The patient was accompanied to the clinic by self.  She was last seen in September and surveillance imaging for MS was ordered, but not completed.  She also did not hear from PT to schedule her visit.  She continues to have worsening balance and weakness in the legs.  She reports having to take a break every 5 steps and has to use another person's walker when trying to walk home from [employer].  She has seen [facility] and had ESI in the past.  She also complains of ongoing numbness in the hands and legs.  She has been compliant with taking dimethyl fumerate.   Her daughter has advanced MS and under hospice care.  She lives in [place] and will be turning 47 next week. She is understandably sad about her prognosis and health.  She does not have any other children.   Medications:  Current Outpatient Medications on File Prior to Visit  Medication Sig Dispense Refill  . Accu-Chek Softclix Lancets lancets AS DIRECTED DAILY 100 each 11  . AMBULATORY NON FORMULARY MEDICATION 4-wheeled rollator 1 each 0  . amLODipine (NORVASC) 5 MG tablet TAKE ONE TABLET BY MOUTH DAILY 90 tablet 0  . APO-VARENICLINE 1 MG tablet TAKE ONE TABLET BY MOUTH TWICE A DAY 180 tablet 1  . aspirin EC 81 MG tablet Take 1 tablet (81 mg total) by mouth daily. 100 tablet 1  . atorvastatin (LIPITOR) 80 MG tablet Take 1 tablet (80 mg total) by mouth daily at 6 PM. Pt needs to keep upcoming appt in Mar for further refills 60 tablet 0  . Blood Glucose Monitoring Suppl (TRUE METRIX GO GLUCOSE METER) w/Device KIT 1 each by Does not apply route every 8 (eight) hours as needed. 1 kit 0  . cholecalciferol  (VITAMIN D3) 25 MCG (1000 UT) tablet Take 5,000 Units by mouth daily.    . clopidogrel (PLAVIX) 75 MG tablet TAKE ONE TABLET BY MOUTH DAILY (Patient taking differently: Take 75 mg by mouth daily.) 30 tablet 11  . cyclobenzaprine (FLEXERIL) 10 MG tablet TAKE ONE TABLET BY MOUTH TWICE A DAY AS NEEDED FOR MUSCLE SPASMS 60 tablet 1  . diclofenac sodium (VOLTAREN) 1 % GEL APPLY TWO GRAMS TOPICALLY FOUR TIMES A DAY (Patient taking differently: Apply 2 g topically 4 (four) times daily as needed (pain).) 100 g 0  . DULoxetine (CYMBALTA) 30 MG capsule Take 1 capsule (30 mg total) by mouth daily. Must have office visit for refills 30 capsule 0  . ezetimibe (ZETIA) 10 MG tablet TAKE ONE TABLET BY MOUTH DAILY 90 tablet 3  . fluticasone (FLONASE) 50 MCG/ACT nasal spray PLACE ONE SPRAY INTO BOTH NOSTRILS DAILY AS NEEDED FOR ALLERGIES OR RHINITIS (Patient taking differently: Place 1 spray into both nostrils daily as needed for allergies.) 16 g 0  . glucose blood (TRUE METRIX BLOOD GLUCOSE TEST) test strip Check blood sugar once a day 100 each 12  . losartan (COZAAR) 25 MG tablet Take 1 tablet (25 mg total) by mouth daily. Must have office visit for refills 30 tablet 0  . metoprolol tartrate (LOPRESSOR) 50 MG tablet Take 0.5 tablets (25 mg total) by mouth 2 (two) times daily. (Patient  taking differently: Take 50 mg by mouth 2 (two) times daily.) 180 tablet 3  . nitroGLYCERIN (NITROSTAT) 0.4 MG SL tablet Place 1 tablet (0.4 mg total) under the tongue every 5 (five) minutes x 3 doses as needed for chest pain. 25 tablet 12  . NON FORMULARY Place into the nose continuous. 3 L of O2 in the pm    . pregabalin (LYRICA) 50 MG capsule Take 1 capsule (50 mg total) by mouth 2 (two) times daily. 60 capsule 6  . TECFIDERA 240 MG CPDR TAKE 1 CAPSULE ($RemoveBe'240MG'[id number]$ ) BY  MOUTH TWICE DAILY 60 capsule 11   No current facility-administered medications on file prior to visit.    Allergies:  Allergies  Allergen Reactions  . Pork-Derived  Products Hives     Vital Signs:  BP (!) 148/92   Pulse 82   Ht $R'5\' 4"'we$  (1.626 m)   Wt 277 lb (125.6 kg)   LMP 04/21/2014   SpO2 95%   BMI 47.55 kg/m   Neurological Exam: MENTAL STATUS including orientation to time, place, person, recent and remote memory, attention span and concentration, language, and fund of knowledge is normal.  Speech is not dysarthric.  CRANIAL NERVES: No visual field defects. Pupils equal round and reactive to light.  Normal conjugate, extra-ocular eye movements in all directions of gaze.  No ptosis.    MOTOR:  Motor strength is 5/5 in all extremities, except bilateral hip flexion shows 4/5.  No pronator drift.  Tone is normal.    MSRs:  Reflexes are 3+/4 throughout.  SENSORY:  Intact to vibration, pin prick, and temperature.  COORDINATION/GAIT:  Normal finger to nose.  Gait is wide-based, unsteady with a three prong cane  Data: Records received from [facility] in [place]  MRI brain with and without contrast 10/26/2016: Extensive periventricular hyperintensity which demonstrate distribution and morphology consistent with multiple sclerosis. There is no evidence of enhancement to suggest acute demyelination.  MRI cervical spine wwo contrast 07/31/2015: Multilevel degenerative change in the cervical spine with mild spinal stenosis at C3-4, C4-5, C5-6. Left foraminal narrowing at C5-6 due to disc and osteophyte.  Spinal cord appears normal without cord lesion or abnormal enhancement. No cord compression.  MRI brain wwo contrast 07/24/2015: Extensive and premature for age white matter signal abnormality strongly suspicious for multiple sclerosis. Chronic microvascular ischemic changes are not excluded, but are less favored.  No restricted diffusion or abnormal postcontrast enhancement at any location, but specifically in association with the white matter, to suggest an acute process.  Cervical spondylosis with suspected central disc  protrusion/extrusion at C4-5 resulting in cord compression. This could contribute to numbness and pain in the extremities. Correlate clinically for radiculopathy or myelopathy. MRI cervical spine may be helpful in further evaluation as clinically indicated.  EMG of the upper extremities 05/26/2015: 1. Bilateral median neuropathy at or distal to the wrist, consistent with clinical diagnosis of carpal tunnel syndrome. Overall, these findings are mild in degree electrically and worse on the right. 2. There is no evidence of a cervical radiculopathy or diffuse myopathy affecting upper extremities.  MRI brain wwo contrast 02/01/2018: Numerous small periventricular white matter hyperintensities are stable since 2016 and are consistent with the clinical diagnosis of multiple sclerosis. No active lesions identified. Interval development of chronic microhemorrhage in the left thalamus and left occipital lobe, question hypertension  Labs 09/29/2014: RF 12, ANA neg, ESR 23  CSF testing 08/14/2015:  R34 W0 P37 G67, IgG index 2.8, MPB < 2.0,  OCB  >5 well defined gamma restriction bands that are also present in the patient's corresponding serum sample, but some bands in the CSF are more prominent   IMPRESSION/PLAN: 1.  Relapsing remitting multiple sclerosis (diagnosed 2016) with disease burden involving the periventricular white matter.  She has been on DMT since 2017 with Tecfidera  - MRI brain and cervical spine wwo contrast  - Continue Tecfidera 254m BID  - Continue vitamin D  2.  Bilateral leg weakness and gait instability  - MRI lumbar spine to evaluate for canal stenosis  - Start out-patient PT for gait training  - Prescription given for 4-wheeled rollator  - If there has been progressive changes on MRI or low back pain remains refractory, follow-up with [facility] for ESI  Return to clinic in 6 months   Thank you for allowing me to participate in patient's care.  If I can answer any  additional questions, I would be pleased to do so.    Sincerely,    Shyheem Whitham K. PPosey Pronto DO

## 2020-11-19 NOTE — Telephone Encounter (Signed)
Contacted pt about the Cymbalta. Pt states she is needing a refill. Lasr rx was dated for 2020. Pt states the Lyrica is not helping. Pt states she doesn't want Tramadol. Pt states she has tried Tylenol and that doesn't help either.

## 2020-11-19 NOTE — Patient Instructions (Addendum)
MRI brain and cervical spine has been ordered.  Please contact [facility] to schedule. [phone]  MRI lumbar spine will be ordered. Will be done at [facility] as well   We will refer you to start physical therapy for leg strengthening and balance training  Prescription for rollator given  Return to clinic in 6 months

## 2020-11-21 NOTE — Progress Notes (Signed)
Cardiology Office Note:    Date:  11/23/2020   ID:  Tiffany Brady, DOB 1961-04-22, MRN [id number]  PCP:  Ladell Pier, MD  Cardiologist:  Sinclair Grooms, MD   Referring MD: Ladell Pier, MD   Chief Complaint  Patient presents with  . Coronary Artery Disease    History of Present Illness:    Tiffany Brady is a 60 y.o. female with a hx of coronary artery disease, non-ST segment myocardial infarction August 21, 2018, Ramus DES 2019,  prediabetes mellitus type II (hemoglobin A1c 6.1), mild hyperlipidemia with untreated LDL of 81 at the time of presentation, cigarette smoking, morbid obesity, essential hypertension, and gout. Other significant medical issue is multiple sclerosis and Covid - 19 infection 2021.  No angina.  She is gaining weight.  Multiple sclerosis minimizes attempts to exercise.  She has not needed to use nitroglycerin.  Difficulty sleeping.  Friends have told her that she stops breathing.  She does not sleep well.  Past Medical History:  Diagnosis Date  . Chronic kidney disease    pt reported kidney failure  . Chronic pain syndrome   . COVID-19   . Gout   . Hypertension   . MS (multiple sclerosis) ([place])   . Tobacco use     Past Surgical History:  Procedure Laterality Date  . FOOT SURGERY Right   . LEFT HEART CATH AND CORONARY ANGIOGRAPHY N/A 08/22/2018   Procedure: LEFT HEART CATH AND CORONARY ANGIOGRAPHY;  Surgeon: Sherren Mocha, MD;  Location: [facility];  Service: Cardiovascular;  Laterality: N/A;    Current Medications: Current Meds  Medication Sig  . Accu-Chek Softclix Lancets lancets AS DIRECTED DAILY  . AMBULATORY NON FORMULARY MEDICATION 4-wheeled rollator  . amLODipine (NORVASC) 5 MG tablet TAKE ONE TABLET BY MOUTH DAILY  . aspirin EC 81 MG tablet Take 1 tablet (81 mg total) by mouth daily.  Marland Kitchen atorvastatin (LIPITOR) 80 MG tablet Take 1 tablet (80 mg total) by mouth daily at 6 PM. Pt needs to keep upcoming appt in  Mar for further refills  . Blood Glucose Monitoring Suppl (TRUE METRIX GO GLUCOSE METER) w/Device KIT 1 each by Does not apply route every 8 (eight) hours as needed.  . cholecalciferol (VITAMIN D3) 25 MCG (1000 UT) tablet Take 5,000 Units by mouth daily.  . clopidogrel (PLAVIX) 75 MG tablet TAKE ONE TABLET BY MOUTH DAILY (Patient taking differently: Take 75 mg by mouth daily.)  . cyclobenzaprine (FLEXERIL) 10 MG tablet TAKE ONE TABLET BY MOUTH TWICE A DAY AS NEEDED FOR MUSCLE SPASMS  . diclofenac sodium (VOLTAREN) 1 % GEL Apply 2 g topically 4 (four) times daily.  . diclofenac Sodium (VOLTAREN) 1 % GEL Apply 2 g topically 4 (four) times daily.  . DULoxetine (CYMBALTA) 30 MG capsule Take 1 capsule (30 mg total) by mouth daily. Must have office visit for refills  . ezetimibe (ZETIA) 10 MG tablet TAKE ONE TABLET BY MOUTH DAILY  . fluticasone (FLONASE) 50 MCG/ACT nasal spray PLACE ONE SPRAY INTO BOTH NOSTRILS DAILY AS NEEDED FOR ALLERGIES OR RHINITIS (Patient taking differently: Place 1 spray into both nostrils daily as needed for allergies.)  . glucose blood (TRUE METRIX BLOOD GLUCOSE TEST) test strip Check blood sugar once a day  . losartan (COZAAR) 25 MG tablet Take 1 tablet (25 mg total) by mouth daily. Must have office visit for refills  . metoprolol tartrate (LOPRESSOR) 50 MG tablet Take 50 mg by mouth 2 (two) times  daily.  . nitroGLYCERIN (NITROSTAT) 0.4 MG SL tablet Place 1 tablet (0.4 mg total) under the tongue every 5 (five) minutes x 3 doses as needed for chest pain.  . NON FORMULARY Place into the nose continuous. 3 L of O2 in the pm  . pregabalin (LYRICA) 50 MG capsule Take 1 capsule (50 mg total) by mouth 2 (two) times daily.  . TECFIDERA 240 MG CPDR TAKE 1 CAPSULE (240MG) BY  MOUTH TWICE DAILY     Allergies:   Pork-derived products   Social History   Socioeconomic History  . Marital status: Widowed    Spouse name: Not on file  . Number of children: Not on file  . Years of  education: Not on file  . Highest education level: Not on file  Occupational History  . Not on file  Tobacco Use  . Smoking status: Current Every Day Smoker    Packs/day: 0.75    Years: 40.00    Pack years: 30.00    Types: Cigarettes  . Smokeless tobacco: Never Used  Vaping Use  . Vaping Use: Never used  Substance and Sexual Activity  . Alcohol use: Yes    Comment: occasional  . Drug use: No  . Sexual activity: Yes    Birth control/protection: Post-menopausal    Comment: 1st intercourse 60 yo-More than 5 partners  Other Topics Concern  . Not on file  Social History Narrative   Lives with niece and her 3 children in a 2 story home.     Only goes upstairs once a day.  Has 1 child.  Does not work.  Trying to get disability.  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     Education: 10th grade.   Right Handed   Social Determinants of Health   Financial Resource Strain: Not on file  Food Insecurity: Not on file  Transportation Needs: Not on file  Physical Activity: Not on file  Stress: Not on file  Social Connections: Not on file     Family History: The patient's family history includes Cancer in her mother; HIV/AIDS in her brother; Hypertension in her mother; Multiple sclerosis in her daughter; Other in her father. There is no history of Breast cancer.  ROS:   Please see the history of present illness.    Occasional left lateral chest discomfort that is aggravated by movement and by laying on the left side.  She had COVID-19 in the interval since last being seen.  She was given oxygen to carry home.  She no longer has to use it.  She follows with Dr. Posey Pronto for multiple sclerosis management.  All other systems reviewed and are negative.  EKGs/Labs/Other Studies Reviewed:    The following studies were reviewed today: CARDIAC CATH 2019: Diagnostic Dominance: Right    Intervention       EKG:  EKG April 10 2020 demonstrates biatrial abnormality,  anterolateral symmetrical T wave inversion.  Recent Labs: 04/09/2020: B Natriuretic Peptide 33.5 04/10/2020: TSH 0.415 04/12/2020: Hemoglobin 16.2; Platelets 255 04/13/2020: ALT 34; BUN 16; Creatinine, Ser 1.06; Potassium 3.7; Sodium 135  Recent Lipid Panel    Component Value Date/Time   CHOL 153 10/09/2019 1136   TRIG 57 10/09/2019 1136   HDL 66 10/09/2019 1136   CHOLHDL 2.3 10/09/2019 1136   CHOLHDL 3.1 08/21/2018 1442   VLDL 6 08/21/2018 1442   LDLCALC 75 10/09/2019 1136    Physical Exam:    VS:  BP 132/72  Pulse 71   Ht 5' 4"  (1.626 m)   Wt 274 lb (124.3 kg)   LMP 04/21/2014   SpO2 97%   BMI 47.03 kg/m     Wt Readings from Last 3 Encounters:  11/23/20 274 lb (124.3 kg)  11/19/20 277 lb (125.6 kg)  11/06/20 278 lb (126.1 kg)     GEN: Morbidly obese. No acute distress HEENT: Normal NECK: No JVD. LYMPHATICS: No lymphadenopathy CARDIAC: No murmur. RRR no gallop, or edema. VASCULAR:  Normal Pulses. No bruits. RESPIRATORY:  Clear to auscultation without rales, wheezing or rhonchi  ABDOMEN: Soft, non-tender, non-distended, No pulsatile mass, MUSCULOSKELETAL: No deformity  SKIN: Warm and dry NEUROLOGIC:  Alert and oriented x 3 PSYCHIATRIC:  Normal affect   ASSESSMENT:    1. CAD in native artery   2. Observed sleep apnea   3. Tobacco abuse   4. Essential hypertension   5. Pure hypertriglyceridemia   6. Medication management   7. Educated about COVID-19 virus infection   8. Daytime sleepiness   9. Snoring   10. Hyperglycemia    PLAN:    In order of problems listed above:  1. She has single-vessel coronary disease with a stent in the ramus intermedius.  Other coronaries are clear.  Multiple risk factors.  Long discussion concerning risk factor modification. 2. Needs a sleep study.  Based on history has obvious sleep apnea, obstructive. 3. Still has a tendency to smoke cigarettes.  Strongly encouraged discontinuation. 4. 2.5 g sodium diet recommended.  Target  130/80 mmHg. 5. LDL less than 70 being achieved with Lipitor 80 mg/day.  Lipid panel will be done today. 6. Multiple medications. 7. Had COVID-19 disease. \ Overall education and awareness concerning primary/secondary risk prevention was discussed in detail: LDL less than 70, hemoglobin A1c less than 7, blood pressure target less than 130/80 mmHg, >150 minutes of moderate aerobic activity per week, avoidance of smoking, weight control (via diet and exercise), and continued surveillance/management of/for obstructive sleep apnea   Medication Adjustments/Labs and Tests Ordered: Current medicines are reviewed at length with the patient today.  Concerns regarding medicines are outlined above.  Orders Placed This Encounter  Procedures  . HgB A1c  . Basic metabolic panel  . Lipid panel  . Hepatic function panel  . Split night study   No orders of the defined types were placed in this encounter.   Patient Instructions  Medication Instructions:  Your physician recommends that you continue on your current medications as directed. Please refer to the Current Medication list given to you today.  *If you need a refill on your cardiac medications before your next appointment, please call your pharmacy*   Lab Work: BMET, Liver, Lipid and A1C today  If you have labs (blood work) drawn today and your tests are completely normal, you will receive your results only by: Marland Kitchen MyChart Message (if you have MyChart) OR . A paper copy in the mail If you have any lab test that is abnormal or we need to change your treatment, we will call you to review the results.   Testing/Procedures: Your physician has recommended that you have a sleep study. This test records several body functions during sleep, including: brain activity, eye movement, oxygen and carbon dioxide blood levels, heart rate and rhythm, breathing rate and rhythm, the flow of air through your mouth and nose, snoring, body muscle movements, and  chest and belly movement.   Follow-Up: At [facility], you and your health needs are  our priority.  As part of our continuing mission to provide you with exceptional heart care, we have created designated Provider Care Teams.  These Care Teams include your primary Cardiologist (physician) and Advanced Practice Providers (APPs -  Physician Assistants and Nurse Practitioners) who all work together to provide you with the care you need, when you need it.  We recommend signing up for the patient portal called "MyChart".  Sign up information is provided on this After Visit Summary.  MyChart is used to connect with patients for Virtual Visits (Telemedicine).  Patients are able to view lab/test results, encounter notes, upcoming appointments, etc.  Non-urgent messages can be sent to your provider as well.   To learn more about what you can do with MyChart, go to [email or web address].    Your next appointment:   1 year(s)  The format for your next appointment:   In Person  Provider:   You may see Sinclair Grooms, MD or one of the following Advanced Practice Providers on your designated Care Team:    Kathyrn Drown, NP    Other Instructions      Signed, Sinclair Grooms, MD  11/23/2020 8:58 AM    [place]

## 2020-11-23 ENCOUNTER — Encounter: Payer: Self-pay | Admitting: Interventional Cardiology

## 2020-11-23 ENCOUNTER — Ambulatory Visit (INDEPENDENT_AMBULATORY_CARE_PROVIDER_SITE_OTHER): Payer: Medicare Other | Admitting: Interventional Cardiology

## 2020-11-23 ENCOUNTER — Other Ambulatory Visit: Payer: Self-pay

## 2020-11-23 VITALS — BP 132/72 | HR 71 | Ht 64.0 in | Wt 274.0 lb

## 2020-11-23 DIAGNOSIS — E781 Pure hyperglyceridemia: Secondary | ICD-10-CM | POA: Diagnosis not present

## 2020-11-23 DIAGNOSIS — I2510 Atherosclerotic heart disease of native coronary artery without angina pectoris: Secondary | ICD-10-CM

## 2020-11-23 DIAGNOSIS — R739 Hyperglycemia, unspecified: Secondary | ICD-10-CM | POA: Diagnosis not present

## 2020-11-23 DIAGNOSIS — Z7189 Other specified counseling: Secondary | ICD-10-CM | POA: Diagnosis not present

## 2020-11-23 DIAGNOSIS — G4730 Sleep apnea, unspecified: Secondary | ICD-10-CM

## 2020-11-23 DIAGNOSIS — R0683 Snoring: Secondary | ICD-10-CM

## 2020-11-23 DIAGNOSIS — I10 Essential (primary) hypertension: Secondary | ICD-10-CM

## 2020-11-23 DIAGNOSIS — Z79899 Other long term (current) drug therapy: Secondary | ICD-10-CM | POA: Diagnosis not present

## 2020-11-23 DIAGNOSIS — R400 Somnolence: Secondary | ICD-10-CM | POA: Diagnosis not present

## 2020-11-23 DIAGNOSIS — Z720 Tobacco use: Secondary | ICD-10-CM

## 2020-11-23 NOTE — Patient Instructions (Signed)
Medication Instructions:  Your physician recommends that you continue on your current medications as directed. Please refer to the Current Medication list given to you today.  *If you need a refill on your cardiac medications before your next appointment, please call your pharmacy*   Lab Work: BMET, Liver, Lipid and A1C today  If you have labs (blood work) drawn today and your tests are completely normal, you will receive your results only by: Marland Kitchen MyChart Message (if you have MyChart) OR . A paper copy in the mail If you have any lab test that is abnormal or we need to change your treatment, we will call you to review the results.   Testing/Procedures: Your physician has recommended that you have a sleep study. This test records several body functions during sleep, including: brain activity, eye movement, oxygen and carbon dioxide blood levels, heart rate and rhythm, breathing rate and rhythm, the flow of air through your mouth and nose, snoring, body muscle movements, and chest and belly movement.   Follow-Up: At [facility], you and your health needs are our priority.  As part of our continuing mission to provide you with exceptional heart care, we have created designated Provider Care Teams.  These Care Teams include your primary Cardiologist (physician) and Advanced Practice Providers (APPs -  Physician Assistants and Nurse Practitioners) who all work together to provide you with the care you need, when you need it.  We recommend signing up for the patient portal called "MyChart".  Sign up information is provided on this After Visit Summary.  MyChart is used to connect with patients for Virtual Visits (Telemedicine).  Patients are able to view lab/test results, encounter notes, upcoming appointments, etc.  Non-urgent messages can be sent to your provider as well.   To learn more about what you can do with MyChart, go to [email or web address].    Your next appointment:   1  year(s)  The format for your next appointment:   In Person  Provider:   You may see Sinclair Grooms, MD or one of the following Advanced Practice Providers on your designated Care Team:    Kathyrn Drown, NP    Other Instructions

## 2020-11-24 LAB — BASIC METABOLIC PANEL
BUN/Creatinine Ratio: 10 — ABNORMAL LOW (ref 12–28)
BUN: 13 mg/dL (ref 8–27)
CO2: 21 mmol/L (ref 20–29)
Calcium: 9.2 mg/dL (ref 8.7–10.3)
Chloride: 102 mmol/L (ref 96–106)
Creatinine, Ser: 1.25 mg/dL — ABNORMAL HIGH (ref 0.57–1.00)
Glucose: 94 mg/dL (ref 65–99)
Potassium: 4.3 mmol/L (ref 3.5–5.2)
Sodium: 138 mmol/L (ref 134–144)
eGFR: 49 mL/min/{1.73_m2} — ABNORMAL LOW (ref >59)

## 2020-11-24 LAB — HEPATIC FUNCTION PANEL
ALT: 12 IU/L (ref 0–32)
AST: 16 IU/L (ref 0–40)
Albumin: 3.9 g/dL (ref 3.8–4.8)
Alkaline Phosphatase: 92 IU/L (ref 44–121)
Bilirubin Total: 0.5 mg/dL (ref 0.0–1.2)
Bilirubin, Direct: 0.16 mg/dL (ref 0.00–0.40)
Total Protein: 7 g/dL (ref 6.0–8.5)

## 2020-11-24 LAB — LIPID PANEL
Chol/HDL Ratio: 3 ratio (ref 0.0–4.4)
Cholesterol, Total: 157 mg/dL (ref 100–199)
HDL: 53 mg/dL (ref >39)
LDL Chol Calc (NIH): 91 mg/dL (ref 0–99)
Triglycerides: 68 mg/dL (ref 0–149)
VLDL Cholesterol Cal: 13 mg/dL (ref 5–40)

## 2020-11-24 LAB — HEMOGLOBIN A1C
Est. average glucose Bld gHb Est-mCnc: 157 mg/dL
Hgb A1c MFr Bld: 7.1 % — ABNORMAL HIGH (ref 4.8–5.6)

## 2020-11-30 ENCOUNTER — Other Ambulatory Visit: Payer: Self-pay | Admitting: Internal Medicine

## 2020-11-30 DIAGNOSIS — I10 Essential (primary) hypertension: Secondary | ICD-10-CM

## 2020-11-30 DIAGNOSIS — M797 Fibromyalgia: Secondary | ICD-10-CM

## 2020-12-10 ENCOUNTER — Ambulatory Visit
Admission: RE | Admit: 2020-12-10 | Discharge: 2020-12-10 | Disposition: A | Discharge status: Home / Self Care | Payer: Medicare Other | Source: Ambulatory Visit | Attending: Neurology | Admitting: Neurology

## 2020-12-10 ENCOUNTER — Other Ambulatory Visit: Payer: Self-pay

## 2020-12-10 ENCOUNTER — Telehealth: Payer: Self-pay

## 2020-12-10 DIAGNOSIS — R292 Abnormal reflex: Secondary | ICD-10-CM

## 2020-12-10 DIAGNOSIS — R2681 Unsteadiness on feet: Secondary | ICD-10-CM

## 2020-12-10 DIAGNOSIS — M47817 Spondylosis without myelopathy or radiculopathy, lumbosacral region: Secondary | ICD-10-CM | POA: Diagnosis not present

## 2020-12-10 DIAGNOSIS — M48061 Spinal stenosis, lumbar region without neurogenic claudication: Secondary | ICD-10-CM | POA: Diagnosis not present

## 2020-12-10 DIAGNOSIS — M47816 Spondylosis without myelopathy or radiculopathy, lumbar region: Secondary | ICD-10-CM | POA: Diagnosis not present

## 2020-12-10 DIAGNOSIS — G35 Multiple sclerosis: Secondary | ICD-10-CM

## 2020-12-10 MED ORDER — GADOBENATE DIMEGLUMINE 529 MG/ML IV SOLN
20.0000 mL | Freq: Once | INTRAVENOUS | Status: AC | PRN
  Administered 2020-12-10: 20 mL via INTRAVENOUS

## 2020-12-10 NOTE — Telephone Encounter (Signed)
Pt called and informed that MRI brain and lumbar spine is stable. No new changes. There has been no progression of multiple sclerosis. Her lumbar spine shows age-related changes, no change since 2017 pt verbalized understanding,

## 2020-12-10 NOTE — Telephone Encounter (Signed)
-----   Message from [name], DO sent at 12/10/2020 12:54 PM EDT ----- Please inform pt that her MRI brain and lumbar spine is stable.  No new changes. There has been no progression of multiple sclerosis.  Her lumbar spine shows age-related changes, no change since 2017.  Thanks.

## 2020-12-11 ENCOUNTER — Other Ambulatory Visit: Payer: Self-pay | Admitting: Internal Medicine

## 2020-12-12 DIAGNOSIS — U071 COVID-19: Secondary | ICD-10-CM | POA: Diagnosis not present

## 2020-12-14 ENCOUNTER — Other Ambulatory Visit: Payer: Medicare Other

## 2020-12-16 ENCOUNTER — Telehealth: Payer: Self-pay | Admitting: *Deleted

## 2020-12-16 NOTE — Telephone Encounter (Signed)
Staff message sent to Gae Bon ok to schedule sleep study. Per [facility] web portal no PA is required. Decision [id number].

## 2020-12-18 ENCOUNTER — Telehealth: Payer: Self-pay | Admitting: *Deleted

## 2020-12-18 NOTE — Telephone Encounter (Signed)
-----   Message from Loren Racer, RN sent at 12/15/2020  2:05 PM EDT ----- Sleep study ordered

## 2020-12-29 ENCOUNTER — Other Ambulatory Visit: Payer: Self-pay

## 2020-12-29 ENCOUNTER — Ambulatory Visit: Payer: Medicare Other | Attending: Internal Medicine | Admitting: Internal Medicine

## 2020-12-29 ENCOUNTER — Encounter: Payer: Self-pay | Admitting: Internal Medicine

## 2020-12-29 DIAGNOSIS — R221 Localized swelling, mass and lump, neck: Secondary | ICD-10-CM

## 2020-12-29 DIAGNOSIS — E1169 Type 2 diabetes mellitus with other specified complication: Secondary | ICD-10-CM | POA: Diagnosis not present

## 2020-12-29 DIAGNOSIS — I152 Hypertension secondary to endocrine disorders: Secondary | ICD-10-CM | POA: Diagnosis not present

## 2020-12-29 DIAGNOSIS — Z720 Tobacco use: Secondary | ICD-10-CM

## 2020-12-29 DIAGNOSIS — N1831 Chronic kidney disease, stage 3a: Secondary | ICD-10-CM | POA: Diagnosis not present

## 2020-12-29 DIAGNOSIS — E1159 Type 2 diabetes mellitus with other circulatory complications: Secondary | ICD-10-CM

## 2020-12-29 DIAGNOSIS — I2510 Atherosclerotic heart disease of native coronary artery without angina pectoris: Secondary | ICD-10-CM

## 2020-12-29 DIAGNOSIS — Z8616 Personal history of COVID-19: Secondary | ICD-10-CM | POA: Diagnosis not present

## 2020-12-29 DIAGNOSIS — G35 Multiple sclerosis: Secondary | ICD-10-CM

## 2020-12-29 LAB — GLUCOSE, POCT (MANUAL RESULT ENTRY): POC Glucose: 160 mg/dl — AB (ref 70–99)

## 2020-12-29 MED ORDER — CYCLOBENZAPRINE HCL 10 MG PO TABS: 10.0000 mg | ORAL_TABLET | Freq: Every day | ORAL | 4 refills | Status: DC | PRN

## 2020-12-29 NOTE — Progress Notes (Signed)
Patient ID: Tiffany Brady, female    DOB: 07-22-1961  MRN: [id number]  CC: Diabetes and Hypertension   Subjective: Tiffany Brady is a 60 y.o. female who presents for chronic ds management. Her concerns today include:  Pt with hx of DM type 2, HTN, Tobdep,CAD,obesity, fibromyalgia, CKD stage 3 andmultiple sclerosis, COVID pneumonia 03/2020.  History of COVID-pneumonia: On last visit patient reported she was still having some shortness of breath with exertion that had persisted post COVID-pneumonia.  Chest x-ray was ordered and patient advised to keep the home oxygen to use with ambulation and we will reevaluate on subsequent visit.  She never did go to have the x-ray done. -Today she reports that she still uses the oxygen sometimes but not as much. -Gets winded with prolonged walking. -Saw her cardiologist recently and sleep study was ordered.  This is scheduled for June 10 with Dr. Radford Pax.  DM/Obesity: wgh down 8 lbs in past 1 mth.  She reports decreased appetite and trying to move a little bit more.  Currently not on any medication for DM  Results for orders placed or performed in visit on 75/91/63  Basic Metabolic Panel  Result Value Ref Range   Glucose 108 (H) 65 - 99 mg/dL   BUN 11 8 - 27 mg/dL   Creatinine, Ser 1.40 (H) 0.57 - 1.00 mg/dL   eGFR 43 (L) >59 mL/min/1.73   BUN/Creatinine Ratio 8 (L) 12 - 28   Sodium 143 134 - 144 mmol/L   Potassium 4.4 3.5 - 5.2 mmol/L   Chloride 102 96 - 106 mmol/L   CO2 23 20 - 29 mmol/L   Calcium 10.2 8.7 - 10.3 mg/dL  POCT glucose (manual entry)  Result Value Ref Range   POC Glucose 160 (A) 70 - 99 mg/dl   Lab Results  Component Value Date   HGBA1C 7.1 (H) 11/23/2020   HTN/CAD: Initial blood pressure reading today is elevated.  She reports that she just took the Cozaar, amlodipine and metoprolol before coming to the appointment.  Reports compliance with aspirin, atorvastatin and Plavix. Limits salt in her foods -No exertional  chest pains. No use of SL Nitro in past few mths Smoking 1/2 pk/day.  Plans to quit soon, too costly. Has patches but not using consistently   MS:  Saw Dr. Posey Pronto 11/19/20 Still on Tecfidera. Had MRI brain and spine - stable changes without new lesions Some days leg weak.  Gets a lot of spasms.  Request RF on Flexeril  CKD 3: GFR has fluctuated between 60-48 over the past year.  Most recent GFR 1 month ago was 49 with creatinine of 1.25 compare to previous GFR of 1.06. Still making good urine.  Complains of swelling in the soft tissue of the left side of the neck above the clavicle.  It has been there for over a year.  The area is tender to touch.  No swelling in the left arm.  We had talked about this in April of last year.  I think it is a lipoma.  I had recommended ultrasound of this area but patient did not follow through on getting it done.  She states she did not go because of the [place] pandemic.  Patient Active Problem List   Diagnosis Date Noted  . Peripheral neuropathy 04/24/2020  . COVID-19 04/09/2020  . Postmenopausal bleeding 08/12/2019  . Alopecia 03/29/2019  . Subcutaneous cyst 03/29/2019  . Snoring 12/10/2018  . NSTEMI (non-ST elevated myocardial infarction) ([place]) 08/21/2018  .  De Quervain's disease (tenosynovitis) 08/02/2018  . Body mass index 40.0-44.9, adult ([place]) 08/02/2018  . Asymmetrical hearing loss of both ears 01/03/2018  . Stress incontinence 05/08/2017  . Cervical radiculopathy 05/08/2017  . CKD (chronic kidney disease) stage 3, GFR 30-59 ml/min (HCC) 04/04/2017  . Vitamin D deficiency 04/04/2017  . Diabetes mellitus type II, controlled ([place]) 04/04/2017  . Fibromyalgia 03/28/2016  . Multiple sclerosis ([place]) 11/02/2015  . Tobacco abuse 04/24/2014  . Back muscle spasm 01/28/2014  . Migraine headache 07/23/2013  . Essential hypertension 06/27/2013  . Chronic pain syndrome 06/27/2013  . Morbid obesity ([place]) 06/27/2013     Current Outpatient Medications on  File Prior to Visit  Medication Sig Dispense Refill  . Accu-Chek Softclix Lancets lancets AS DIRECTED DAILY 100 each 11  . AMBULATORY NON FORMULARY MEDICATION 4-wheeled rollator 1 each 0  . amLODipine (NORVASC) 5 MG tablet TAKE ONE TABLET BY MOUTH DAILY 90 tablet 0  . aspirin EC 81 MG tablet Take 1 tablet (81 mg total) by mouth daily. 100 tablet 1  . atorvastatin (LIPITOR) 80 MG tablet Take 1 tablet (80 mg total) by mouth daily at 6 PM. Pt needs to keep upcoming appt in Mar for further refills 60 tablet 0  . cholecalciferol (VITAMIN D3) 25 MCG (1000 UT) tablet Take 5,000 Units by mouth daily.    . clopidogrel (PLAVIX) 75 MG tablet TAKE ONE TABLET BY MOUTH DAILY (Patient taking differently: Take 75 mg by mouth daily.) 30 tablet 11  . diclofenac sodium (VOLTAREN) 1 % GEL Apply 2 g topically 4 (four) times daily. 100 g 1  . diclofenac Sodium (VOLTAREN) 1 % GEL Apply 2 g topically 4 (four) times daily. 100 g 2  . DULoxetine (CYMBALTA) 30 MG capsule Take 1 capsule (30 mg total) by mouth daily. Must have office visit for refills 30 capsule 0  . ezetimibe (ZETIA) 10 MG tablet TAKE ONE TABLET BY MOUTH DAILY 90 tablet 3  . fluticasone (FLONASE) 50 MCG/ACT nasal spray PLACE ONE SPRAY INTO BOTH NOSTRILS DAILY AS NEEDED FOR ALLERGIES OR RHINITIS (Patient taking differently: Place 1 spray into both nostrils daily as needed for allergies.) 16 g 0  . losartan (COZAAR) 25 MG tablet Take 1 tablet (25 mg total) by mouth daily. Must have office visit for refills 30 tablet 0  . metoprolol tartrate (LOPRESSOR) 50 MG tablet Take 50 mg by mouth 2 (two) times daily.    . nitroGLYCERIN (NITROSTAT) 0.4 MG SL tablet Place 1 tablet (0.4 mg total) under the tongue every 5 (five) minutes x 3 doses as needed for chest pain. 25 tablet 12  . NON FORMULARY Place into the nose continuous. 3 L of O2 in the pm    . pregabalin (LYRICA) 50 MG capsule Take 1 capsule (50 mg total) by mouth 2 (two) times daily. 60 capsule 6  . TECFIDERA  240 MG CPDR TAKE 1 CAPSULE (240MG) BY  MOUTH TWICE DAILY 60 capsule 11   No current facility-administered medications on file prior to visit.    Allergies  Allergen Reactions  . Pork-Derived Metallurgist    Social History   Socioeconomic History  . Marital status: Widowed    Spouse name: Not on file  . Number of children: Not on file  . Years of education: Not on file  . Highest education level: Not on file  Occupational History  . Not on file  Tobacco Use  . Smoking status: Current Every Day Smoker    Packs/day:  0.75    Years: 40.00    Pack years: 30.00    Types: Cigarettes  . Smokeless tobacco: Never Used  Vaping Use  . Vaping Use: Never used  Substance and Sexual Activity  . Alcohol use: Yes    Comment: occasional  . Drug use: No  . Sexual activity: Yes    Birth control/protection: Post-menopausal    Comment: 1st intercourse 60 yo-More than 5 partners  Other Topics Concern  . Not on file  Social History Narrative   Lives with niece and her 3 children in a 2 story home.     Only goes upstairs once a day.  Has 1 child.  Does not work.  Trying to get disability.  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     Education: 10th grade.   Right Handed   Social Determinants of Health   Financial Resource Strain: Not on file  Food Insecurity: Not on file  Transportation Needs: Not on file  Physical Activity: Not on file  Stress: Not on file  Social Connections: Not on file  Intimate Partner Violence: Not on file    Family History  Problem Relation Age of Onset  . Cancer Mother        Deceased-Stomach  . Hypertension Mother   . Other Father        Deceased, 20  . HIV/AIDS Brother        Deceased  . Multiple sclerosis Daughter   . Breast cancer Neg Hx     Past Surgical History:  Procedure Laterality Date  . FOOT SURGERY Right   . LEFT HEART CATH AND CORONARY ANGIOGRAPHY N/A 08/22/2018   Procedure: LEFT HEART CATH AND CORONARY ANGIOGRAPHY;   Surgeon: Sherren Mocha, MD;  Location: [facility];  Service: Cardiovascular;  Laterality: N/A;    ROS: Review of Systems Negative except as stated above  PHYSICAL EXAM: BP 130/76   Pulse 82   Resp 16   Wt 266 lb (120.7 kg)   LMP 04/21/2014   SpO2 95%   BMI 45.66 kg/m   Wt Readings from Last 3 Encounters:  12/29/20 266 lb (120.7 kg)  11/23/20 274 lb (124.3 kg)  11/19/20 277 lb (125.6 kg)  Pulse ox at rest RA 93-95% Pox with ambulation RA 93-96%  Physical Exam General appearance - alert, well appearing, obese older AAF and in no distress Mental status - normal mood, behavior, speech, dress, motor activity, and thought processes Neck - supple, no significant adenopathy.  Soft tissue above the left clavicle is enlarged and more pronounced compared to the right side.  There is a soft movable mass there.  Slightly tender to touch. Chest - clear to auscultation, no wheezes, rales or rhonchi, symmetric air entry Heart - normal rate, regular rhythm, normal S1, S2, no murmurs, rubs, clicks or gallops Musculoskeletal -patient ambulates with a cane.  Gait is steady.  She transfers independently to the exam table. Extremities -no lower extremity edema.   CMP Latest Ref Rng & Units 12/29/2020 11/23/2020 04/13/2020  Glucose 65 - 99 mg/dL 108(H) 94 163(H)  BUN 8 - 27 mg/dL 11 13 16   Creatinine 0.57 - 1.00 mg/dL 1.40(H) 1.25(H) 1.06(H)  Sodium 134 - 144 mmol/L 143 138 135  Potassium 3.5 - 5.2 mmol/L 4.4 4.3 3.7  Chloride 96 - 106 mmol/L 102 102 101  CO2 20 - 29 mmol/L 23 21 24   Calcium 8.7 - 10.3 mg/dL 10.2 9.2 8.4(L)  Total Protein  6.0 - 8.5 g/dL - 7.0 6.4(L)  Total Bilirubin 0.0 - 1.2 mg/dL - 0.5 0.8  Alkaline Phos 44 - 121 IU/L - 92 61  AST 0 - 40 IU/L - 16 30  ALT 0 - 32 IU/L - 12 34   Lipid Panel     Component Value Date/Time   CHOL 157 11/23/2020 0905   TRIG 68 11/23/2020 0905   HDL 53 11/23/2020 0905   CHOLHDL 3.0 11/23/2020 0905   CHOLHDL 3.1 08/21/2018 1442    VLDL 6 08/21/2018 1442   LDLCALC 91 11/23/2020 0905    CBC    Component Value Date/Time   WBC 5.2 04/12/2020 2146   RBC 5.60 (H) 04/12/2020 2146   HGB 16.2 (H) 04/12/2020 2146   HGB 16.2 (H) 10/09/2019 1136   HCT 50.5 (H) 04/12/2020 2146   HCT 48.4 (H) 10/09/2019 1136   PLT 255 04/12/2020 2146   PLT 242 10/09/2019 1136   MCV 90.2 04/12/2020 2146   MCV 89 10/09/2019 1136   MCH 28.9 04/12/2020 2146   MCHC 32.1 04/12/2020 2146   RDW 13.2 04/12/2020 2146   RDW 12.3 10/09/2019 1136   LYMPHSABS 0.7 04/09/2020 0850   MONOABS 0.2 04/09/2020 0850   EOSABS 0.0 04/09/2020 0850   BASOSABS 0.0 04/09/2020 0850    ASSESSMENT AND PLAN: 1. Type 2 diabetes mellitus with morbid obesity ([place]) Recent A1c close to goal. Discussed putting her on Farxiga but I will recheck chemistry first to see where her GFR advised at this time. Dietary counseling given.  Try to move as much as she can. - POCT glucose (manual entry)  2. Hypertension associated with diabetes ([place]) Repeat blood pressure at goal.  She will continue her current medications and low-salt diet.  3. Neck mass Feels that this is a lipoma.  We will get ultrasound to evaluate further - US Soft Tissue Head/Neck (NON-THYROID); Future  4. Tobacco abuse Advised to quit given history of CAD.  Patient states that she is trying to and will get there but refuses to commit completely.  Advised to set a quit date and use the patches consistently if she really wants to quit.  5. Stage 3a chronic kidney disease ([place]) Advised to avoid NSAIDs. To consider adding Wilder Glade as discussed in #1 above - Basic Metabolic Panel  6. Coronary artery disease involving native coronary artery of native heart without angina pectoris Stable.  Continue aspirin, Plavix, atorvastatin and metoprolol.  Strongly advised to quit smoking  7. Multiple sclerosis (HCC) - cyclobenzaprine (FLEXERIL) 10 MG tablet; Take 1 tablet (10 mg total) by mouth daily as needed for  muscle spasms.  Dispense: 30 tablet; Refill: 4  8. Personal history of COVID-19 Patient's oxygen level though sometimes in the lower 90s is staying above requirement for O2.  I think we can go ahead and discontinue and have it removed from the home.  She will call us back with the name of the company.    Patient was given the opportunity to ask questions.  Patient verbalized understanding of the plan and was able to repeat key elements of the plan.   Orders Placed This Encounter  Procedures  . US Soft Tissue Head/Neck (NON-THYROID)  . Basic Metabolic Panel  . POCT glucose (manual entry)     Requested Prescriptions   Signed Prescriptions Disp Refills  . cyclobenzaprine (FLEXERIL) 10 MG tablet 30 tablet 4    Sig: Take 1 tablet (10 mg total) by mouth daily as needed for muscle  spasms.    No follow-ups on file.  Karle Plumber, MD, FACP

## 2020-12-29 NOTE — Patient Instructions (Signed)
Given your kidney function, you should avoid taking over-the-counter pain medications like ibuprofen, Aleve, Advil, Naprosyn as these can make kidney function worse.  We will check your kidney function.  If it is stable, we will consider putting you on a low-dose of medication for diabetes like metformin or [place].  I will send a prescription to your pharmacy for diabetic testing supplies.

## 2020-12-30 ENCOUNTER — Other Ambulatory Visit: Payer: Self-pay | Admitting: Internal Medicine

## 2020-12-30 LAB — BASIC METABOLIC PANEL
BUN/Creatinine Ratio: 8 — ABNORMAL LOW (ref 12–28)
BUN: 11 mg/dL (ref 8–27)
CO2: 23 mmol/L (ref 20–29)
Calcium: 10.2 mg/dL (ref 8.7–10.3)
Chloride: 102 mmol/L (ref 96–106)
Creatinine, Ser: 1.4 mg/dL — ABNORMAL HIGH (ref 0.57–1.00)
Glucose: 108 mg/dL — ABNORMAL HIGH (ref 65–99)
Potassium: 4.4 mmol/L (ref 3.5–5.2)
Sodium: 143 mmol/L (ref 134–144)
eGFR: 43 mL/min/{1.73_m2} — ABNORMAL LOW (ref >59)

## 2020-12-30 MED ORDER — DAPAGLIFLOZIN PROPANEDIOL 5 MG PO TABS: 5.0000 mg | ORAL_TABLET | Freq: Every day | ORAL | 4 refills | Status: DC

## 2020-12-31 ENCOUNTER — Other Ambulatory Visit: Payer: Self-pay | Admitting: Physician Assistant

## 2021-01-01 NOTE — Telephone Encounter (Signed)
Patient is scheduled for lab study on 02-19-21. Patient understands her sleep study will be done at [facility] sleep lab. Patient understands she will receive a sleep packet in a week or so. Patient understands to call if she does not receive the sleep packet in a timely manner. Patient agrees with treatment and thanked me for call.

## 2021-01-05 ENCOUNTER — Ambulatory Visit (HOSPITAL_COMMUNITY): Payer: Medicare Other

## 2021-01-12 ENCOUNTER — Inpatient Hospital Stay: Admission: RE | Admit: 2021-01-12 | Payer: Medicare Other | Source: Ambulatory Visit

## 2021-01-20 ENCOUNTER — Ambulatory Visit: Payer: Medicare Other

## 2021-01-27 ENCOUNTER — Other Ambulatory Visit: Payer: Self-pay | Admitting: Internal Medicine

## 2021-01-27 DIAGNOSIS — I10 Essential (primary) hypertension: Secondary | ICD-10-CM

## 2021-01-27 NOTE — Telephone Encounter (Signed)
LOV 12/29/20. Approved per protocol.  Requested Prescriptions  Pending Prescriptions Disp Refills  . amLODipine (NORVASC) 5 MG tablet [Pharmacy Med Name: amLODIPine BESYLATE 5MG  TAB] 90 tablet 0    Sig: TAKE ONE TABLET BY MOUTH DAILY     Cardiovascular:  Calcium Channel Blockers Passed - 01/27/2021  4:29 PM      Passed - Last BP in normal range    BP Readings from Last 1 Encounters:  12/29/20 130/76         Passed - Valid encounter within last 6 months    Recent Outpatient Visits          4 weeks ago Type 2 diabetes mellitus with morbid obesity ([place])   [place] Karle Plumber B, MD   8 months ago SOB (shortness of breath)   [place] Ladell Pier, MD   9 months ago Hospital discharge follow-up   [place] Ladell Pier, MD   1 year ago Controlled type 2 diabetes mellitus with other circulatory complication, without long-term current use of insulin [facility])   [place] Karle Plumber B, MD   1 year ago Controlled type 2 diabetes mellitus with other circulatory complication, without long-term current use of insulin [facility])   [place] [facility] Ladell Pier, MD

## 2021-01-28 ENCOUNTER — Telehealth: Payer: Self-pay | Admitting: Neurology

## 2021-01-28 NOTE — Telephone Encounter (Signed)
Please advise 

## 2021-01-28 NOTE — Telephone Encounter (Signed)
Patient called for orders for walker:   [facility]  Fax: [phone]  Upright Gilford Rile

## 2021-01-29 ENCOUNTER — Other Ambulatory Visit: Payer: Self-pay | Admitting: *Deleted

## 2021-01-29 DIAGNOSIS — G35 Multiple sclerosis: Secondary | ICD-10-CM

## 2021-01-29 MED ORDER — AMBULATORY NON FORMULARY MEDICATION: 0 refills | Status: AC

## 2021-01-29 NOTE — Telephone Encounter (Signed)
Rx upright walker print and sign. [facility]   Fax: [phone]

## 2021-01-29 NOTE — Telephone Encounter (Signed)
Ok to provide although insurance may not pay for this type.  If you print, I will sign it and you can fax

## 2021-02-01 NOTE — Telephone Encounter (Signed)
FYI below message

## 2021-02-01 NOTE — Telephone Encounter (Signed)
[facility] called and said they are not in-network with patient's insurance company so they cannot help her with a walker at this time. They will call the patient and let her know.

## 2021-02-02 NOTE — Telephone Encounter (Signed)
Please see if she would like [name] to fax the order to another rehab facility or medical supply store.

## 2021-02-02 NOTE — Telephone Encounter (Signed)
Notified pt--Faxed Rx  upright walker --to another supplier [employer], television/film set) [phone].

## 2021-02-19 ENCOUNTER — Encounter (HOSPITAL_BASED_OUTPATIENT_CLINIC_OR_DEPARTMENT_OTHER): Payer: Medicare Other | Admitting: Cardiology

## 2021-03-09 ENCOUNTER — Inpatient Hospital Stay: Admission: RE | Admit: 2021-03-09 | Payer: Medicare Other | Source: Ambulatory Visit

## 2021-04-12 ENCOUNTER — Other Ambulatory Visit: Payer: Self-pay | Admitting: Internal Medicine

## 2021-04-12 DIAGNOSIS — I10 Essential (primary) hypertension: Secondary | ICD-10-CM

## 2021-04-12 DIAGNOSIS — M654 Radial styloid tenosynovitis [de Quervain]: Secondary | ICD-10-CM

## 2021-04-19 ENCOUNTER — Ambulatory Visit: Payer: Self-pay | Admitting: *Deleted

## 2021-04-19 DIAGNOSIS — G6289 Other specified polyneuropathies: Secondary | ICD-10-CM

## 2021-04-19 MED ORDER — ACYCLOVIR 800 MG PO TABS: 800.0000 mg | ORAL_TABLET | Freq: Every day | ORAL | 0 refills | Status: AC

## 2021-04-19 MED ORDER — PREGABALIN 50 MG PO CAPS: 50.0000 mg | ORAL_CAPSULE | Freq: Two times a day (BID) | ORAL | 1 refills | Status: DC

## 2021-04-19 NOTE — Telephone Encounter (Signed)
Patient has noticed a red/pink rash on their chest and back   The rash has been present since Wednesday 04/14/21   The patient shares that the rash is sensitive to touch and shares that clear fluid has come out of one spot   Please contact further when possible  Reason for Disposition  [1] Shingles rash AND [2] onset > 72 hours ago (3 days)  Answer Assessment - Initial Assessment Questions 1. APPEARANCE of RASH: "Describe the rash."      Red bumps, moist 2. LOCATION: "Where is the rash located?"      Mid chest near breasts to mid back 3. ONSET: "When did the rash start?"      Thursday  4. ITCHING: "Does the rash itch?" If Yes, ask: "How bad is the itch?"  (Scale 1-10; or mild, moderate, severe)     yes 5. PAIN: "Does the rash hurt?" If Yes, ask: "How bad is the pain?"  (Scale 0-10; or none, mild, moderate, severe)    - NONE (0): no pain    - MILD (1-3): doesn't interfere with normal activities     - MODERATE (4-7): interferes with normal activities or awakens from sleep     - SEVERE (8-10): excruciating pain, unable to do any normal activities     15/10 6. OTHER SYMPTOMS: "Do you have any other symptoms?" (e.g., fever)     No  Protocols used: Shingles ([place])-A-AH

## 2021-04-19 NOTE — Addendum Note (Signed)
Addended by: Karle Plumber B on: 04/19/2021 05:10 PM   Modules accepted: Orders

## 2021-04-19 NOTE — Telephone Encounter (Signed)
Pt reports red raised rash, moist in some areas. States runs "In a line" from under breasts to mid back. 15/10 pain. "Can't even touch." Also reports itching. Denies fever. Has been applying calamine lotion.Does not have rash elsewhere. No availability within protocol timeframe. Care advise given with shingles protocol. Pt requesting meds be called in for relief. Assured pt NT would route to  practice for PCPs review and final disposition. Pt verbalizes understanding.  Please advise:[phone]  Advised UC for worsening symptoms.

## 2021-04-19 NOTE — Telephone Encounter (Signed)
Will forward to provider  

## 2021-04-20 NOTE — Telephone Encounter (Signed)
Contacted pt to go over provider response pt didn't answer and was unable to lvm due to vm going busy signal

## 2021-05-06 ENCOUNTER — Ambulatory Visit: Payer: Self-pay | Admitting: *Deleted

## 2021-05-06 NOTE — Telephone Encounter (Signed)
Pt reports "Lump under right breast, where my breast lies in my stomach." States noted few weeks ago, after shingles. Denies pain, no redness no itching "Under the skin, square shaped." Pt has appt 05/18/21, scheduled prior to triage. Assured pt NT would route to practice for PCPs review. Advised ED for worsening symptoms.  Pt verbalizes understanding.     Reason for Disposition  [1] Small swelling or lump AND [2] unexplained AND [3] present > 1 week  Answer Assessment - Initial Assessment Questions 1. APPEARANCE of SWELLING: "What does it look like?" (e.g., lymph node, insect bite, mole)     "Under skin" 2. SIZE: "How large is the swelling?" (e.g., inches, cm; or compare to size of pinhead, tip of pen, eraser, coin, pea, grape, ping pong ball)      1in to 1 1/2 3. LOCATION: "Where is the swelling located?"     Under right breast 4. ONSET: "When did the swelling start?"     "After I had shingles" Few eeks ago 5. PAIN: "Is it painful?" If Yes, ask: "How much?"     no 6. ITCH: "Does it itch?" If Yes, ask: "How much?"     no 7. CAUSE: "What do you think caused the swelling?"     Unsure 8. OTHER SYMPTOMS: "Do you have any other symptoms?" (e.g., fever)     no  Protocols used: Skin Lump or Localized Swelling-A-AH

## 2021-05-06 NOTE — Telephone Encounter (Signed)
Pt called and mentioned that she wanted to schedule an appt for shingles shot / but then she mentioned she other things going on and stated she has hardness on the right side of her stomach/ she stated it doesn't interfere with eating or drinking and it doesn't hurt and there is no pain / please advise if needed/ pt scheduled an appt for next available on 9.6.22/ pt only wanted to see Dr. Francene Boyers patient to review symptoms. [phone]  reports subscriber not in service. Called home [phone] as listed in chart and no answer, left message to call clinic back .

## 2021-05-14 ENCOUNTER — Other Ambulatory Visit: Payer: Self-pay | Admitting: Internal Medicine

## 2021-05-14 DIAGNOSIS — G35 Multiple sclerosis: Secondary | ICD-10-CM

## 2021-05-18 ENCOUNTER — Other Ambulatory Visit: Payer: Self-pay

## 2021-05-18 ENCOUNTER — Encounter: Payer: Self-pay | Admitting: Internal Medicine

## 2021-05-18 ENCOUNTER — Ambulatory Visit: Payer: Medicare Other | Attending: Internal Medicine | Admitting: Internal Medicine

## 2021-05-18 ENCOUNTER — Other Ambulatory Visit (HOSPITAL_COMMUNITY)
Admission: RE | Admit: 2021-05-18 | Discharge: 2021-05-18 | Disposition: A | Discharge status: Home / Self Care | Payer: Medicare Other | Source: Ambulatory Visit | Attending: Internal Medicine | Admitting: Internal Medicine

## 2021-05-18 DIAGNOSIS — N898 Other specified noninflammatory disorders of vagina: Secondary | ICD-10-CM | POA: Insufficient documentation

## 2021-05-18 DIAGNOSIS — G6289 Other specified polyneuropathies: Secondary | ICD-10-CM | POA: Diagnosis not present

## 2021-05-18 DIAGNOSIS — Z8619 Personal history of other infectious and parasitic diseases: Secondary | ICD-10-CM

## 2021-05-18 DIAGNOSIS — R0683 Snoring: Secondary | ICD-10-CM | POA: Diagnosis not present

## 2021-05-18 DIAGNOSIS — I2510 Atherosclerotic heart disease of native coronary artery without angina pectoris: Secondary | ICD-10-CM

## 2021-05-18 DIAGNOSIS — E1159 Type 2 diabetes mellitus with other circulatory complications: Secondary | ICD-10-CM

## 2021-05-18 DIAGNOSIS — Z23 Encounter for immunization: Secondary | ICD-10-CM | POA: Diagnosis not present

## 2021-05-18 DIAGNOSIS — M797 Fibromyalgia: Secondary | ICD-10-CM | POA: Diagnosis not present

## 2021-05-18 DIAGNOSIS — F17210 Nicotine dependence, cigarettes, uncomplicated: Secondary | ICD-10-CM

## 2021-05-18 DIAGNOSIS — F17200 Nicotine dependence, unspecified, uncomplicated: Secondary | ICD-10-CM

## 2021-05-18 DIAGNOSIS — I152 Hypertension secondary to endocrine disorders: Secondary | ICD-10-CM | POA: Diagnosis not present

## 2021-05-18 DIAGNOSIS — E1169 Type 2 diabetes mellitus with other specified complication: Secondary | ICD-10-CM | POA: Diagnosis not present

## 2021-05-18 LAB — GLUCOSE, POCT (MANUAL RESULT ENTRY): POC Glucose: 98 mg/dl (ref 70–99)

## 2021-05-18 LAB — POCT GLYCOSYLATED HEMOGLOBIN (HGB A1C): HbA1c, POC (controlled diabetic range): 6.6 % (ref 0.0–7.0)

## 2021-05-18 MED ORDER — DULOXETINE HCL 30 MG PO CPEP: 30.0000 mg | ORAL_CAPSULE | Freq: Every day | ORAL | 6 refills | Status: DC

## 2021-05-18 MED ORDER — PREGABALIN 75 MG PO CAPS: 75.0000 mg | ORAL_CAPSULE | Freq: Two times a day (BID) | ORAL | 6 refills | Status: DC

## 2021-05-18 MED ORDER — FLUCONAZOLE 150 MG PO TABS: 150.0000 mg | ORAL_TABLET | Freq: Every day | ORAL | 0 refills | Status: DC

## 2021-05-18 MED ORDER — AMLODIPINE BESYLATE 5 MG PO TABS: 5.0000 mg | ORAL_TABLET | Freq: Every day | ORAL | 3 refills | Status: DC

## 2021-05-18 NOTE — Progress Notes (Signed)
Patient ID: Tiffany Brady, female    DOB: [birth year]  MRN: [id number]  CC: Medication Refill and Immunizations (Patient has some itching in private area.)   Subjective: Tiffany Brady is a 61 y.o. female who presents for chronic disease management Her concerns today include:  Pt with hx of DM type 2, HTN, Tob dep, CAD, obesity, fibromyalgia, CKD stage 3 and multiple sclerosis, COVID pneumonia 03/2020.  Reports having had shingles 1 mth ago.  Wants shingles vaccine.  Also due for the pneumonia and flu vaccines.  Reports some pain RT side of chest posterior and anterior and numbness across RUQ abdomen since having shingles in this area.  Fibromyalgia/polyneuropathy: wants higher dose of Lyrica and needs RF on Cymbalta.  Has been out of Cymbalta for several mths.  Daughter died 02/25/23 of this yr in [place].  She was not able to make it back to [place] for the cremation.  DM: Results for orders placed or performed in visit on 05/18/21  POCT glucose (manual entry)  Result Value Ref Range   POC Glucose 98 70 - 99 mg/dl  POCT glycosylated hemoglobin (Hb A1C)  Result Value Ref Range   Hemoglobin A1C     HbA1c POC (<> result, manual entry)     HbA1c, POC (prediabetic range)     HbA1c, POC (controlled diabetic range) 6.6 0.0 - 7.0 %  On Farxiga and taking it consistently Loss additional 3 lbs since last visit for total of 11 pounds since March of this year..  Reports eating habits fluctuates.  Trying to get outside and move more.   HYPERTENSION/CAD Currently taking: see medication list Med Adherence: '[x]'$  Yes -Cozaar, Metoprolol, ASA, Zetia, Lipitor.  Needs RF on Norvasc, out for a few wks Medication side effects: '[]'$  Yes    '[x]'$  No Adherence with salt restriction: '[x]'$  Yes    '[]'$  No Home Monitoring?: '[]'$  Yes    '[x]'$  No Monitoring Frequency:  Home BP results range:  SOB? '[]'$  Yes    '[x]'$  No Chest Pain?: '[]'$  Yes    '[x]'$  No Leg swelling?: '[]'$  Yes    '[x]'$  No Headaches?: '[]'$  Yes    '[x]'$  No Dizziness? '[]'$   Yes    '[x]'$  No Comments: missed sleep study appt with Dr.Turner.  Endorses loud snoring, wakes un-refresh.    Tobacco Dep: reports she continues to decrease use.  1 pk last >3 days.   Complains of vaginal itching x1 week.  Would like to be screened for yeast and STDs.  She is sexually active with 1 female partner.  No vaginal discharge or itching at this time.   Patient Active Problem List   Diagnosis Date Noted   Peripheral neuropathy 04/24/2020   COVID-19 04/09/2020   Postmenopausal bleeding 08/12/2019   Alopecia 03/29/2019   Subcutaneous cyst 03/29/2019   Snoring 12/10/2018   NSTEMI (non-ST elevated myocardial infarction) ([place]) 08/21/2018   De Quervain's disease (tenosynovitis) 08/02/2018   Body mass index 40.0-44.9, adult ([place]) 08/02/2018   Asymmetrical hearing loss of both ears 01/03/2018   Stress incontinence 05/08/2017   Cervical radiculopathy 05/08/2017   CKD (chronic kidney disease) stage 3, GFR 30-59 ml/min ([place]) 04/04/2017   Vitamin D deficiency 04/04/2017   Diabetes mellitus type II, controlled ([place]) 04/04/2017   Fibromyalgia 03/28/2016   Multiple sclerosis ([place]) 11/02/2015   Tobacco abuse 04/24/2014   Back muscle spasm Feb 24, 2014   Migraine headache 07/23/2013   Essential hypertension 06/27/2013   Chronic pain syndrome 06/27/2013   Morbid obesity ([place]) 06/27/2013  Current Outpatient Medications on File Prior to Visit  Medication Sig Dispense Refill   Accu-Chek Softclix Lancets lancets AS DIRECTED DAILY 100 each 11   acyclovir (ZOVIRAX) 800 MG tablet Take 1 tablet (800 mg total) by mouth 5 (five) times daily. 35 tablet 0   AMBULATORY NON FORMULARY MEDICATION 4-wheeled rollator 1 each 0   AMBULATORY NON FORMULARY MEDICATION Upright walker 1 Product 0   aspirin EC 81 MG tablet Take 1 tablet (81 mg total) by mouth daily. 100 tablet 1   atorvastatin (LIPITOR) 80 MG tablet TAKE ONE TABLET BY MOUTH DAILY AT 6PM. KEEP UPCOMING APPT IN [facility] FOR FURTHER REFILLS 30 tablet  11   cholecalciferol (VITAMIN D3) 25 MCG (1000 UT) tablet Take 5,000 Units by mouth daily.     clopidogrel (PLAVIX) 75 MG tablet TAKE ONE TABLET BY MOUTH DAILY 30 tablet 10   cyclobenzaprine (FLEXERIL) 10 MG tablet Take 1 tablet (10 mg total) by mouth daily as needed for muscle spasms. 30 tablet 4   dapagliflozin propanediol (FARXIGA) 5 MG TABS tablet Take 1 tablet (5 mg total) by mouth daily before breakfast. 30 tablet 4   diclofenac Sodium (VOLTAREN) 1 % GEL Apply 2 g topically 4 (four) times daily. 100 g 2   diclofenac Sodium (VOLTAREN) 1 % GEL APPLY TWO G TOPICALLY 4 (FOUR) TIMES DAILY. 100 g 2   ezetimibe (ZETIA) 10 MG tablet TAKE ONE TABLET BY MOUTH DAILY 90 tablet 3   fluticasone (FLONASE) 50 MCG/ACT nasal spray PLACE ONE SPRAY INTO BOTH NOSTRILS DAILY AS NEEDED FOR ALLERGIES OR RHINITIS (Patient taking differently: Place 1 spray into both nostrils daily as needed for allergies.) 16 g 0   losartan (COZAAR) 25 MG tablet Take 1 tablet (25 mg total) by mouth daily. Must have office visit for refills 30 tablet 0   metoprolol tartrate (LOPRESSOR) 50 MG tablet Take 50 mg by mouth 2 (two) times daily.     nitroGLYCERIN (NITROSTAT) 0.4 MG SL tablet Place 1 tablet (0.4 mg total) under the tongue every 5 (five) minutes x 3 doses as needed for chest pain. 25 tablet 12   NON FORMULARY Place into the nose continuous. 3 L of O2 in the pm     TECFIDERA 240 MG CPDR TAKE 1 CAPSULE ('240MG'$ ) BY  MOUTH TWICE DAILY 60 capsule 11   No current facility-administered medications on file prior to visit.    Allergies  Allergen Reactions   Pork-Derived Products Hives    Social History   Socioeconomic History   Marital status: Widowed    Spouse name: Not on file   Number of children: Not on file   Years of education: Not on file   Highest education level: Not on file  Occupational History   Not on file  Tobacco Use   Smoking status: Every Day    Packs/day: 0.75    Years: 40.00    Pack years: 30.00     Types: Cigarettes   Smokeless tobacco: Never  Vaping Use   Vaping Use: Never used  Substance and Sexual Activity   Alcohol use: Yes    Comment: occasional   Drug use: No   Sexual activity: Yes    Birth control/protection: Post-menopausal    Comment: 1st intercourse 60 yo-More than 5 partners  Other Topics Concern   Not on file  Social History Narrative   Lives with niece and her 3 children in a 2 story home.     Only goes upstairs once a  day.  Has 1 child.  Does not work.  Trying to get disability.  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     Education: 10th grade.   Right Handed   Social Determinants of Health   Financial Resource Strain: Not on file  Food Insecurity: Not on file  Transportation Needs: Not on file  Physical Activity: Not on file  Stress: Not on file  Social Connections: Not on file  Intimate Partner Violence: Not on file    Family History  Problem Relation Age of Onset   Cancer Mother        Deceased-Stomach   Hypertension Mother    Other Father        Deceased, 75   HIV/AIDS Brother        Deceased   Multiple sclerosis Daughter    Breast cancer Neg Hx     Past Surgical History:  Procedure Laterality Date   FOOT SURGERY Right    LEFT HEART CATH AND CORONARY ANGIOGRAPHY N/A 08/22/2018   Procedure: LEFT HEART CATH AND CORONARY ANGIOGRAPHY;  Surgeon: Sherren Mocha, MD;  Location: [facility];  Service: Cardiovascular;  Laterality: N/A;    ROS: Review of Systems Negative except as stated above  PHYSICAL EXAM: BP (!) 147/90   Pulse 76   Resp 16   Wt 263 lb (119.3 kg)   LMP 04/21/2014   SpO2 94%   BMI 45.14 kg/m   Wt Readings from Last 3 Encounters:  05/18/21 263 lb (119.3 kg)  12/29/20 266 lb (120.7 kg)  11/23/20 274 lb (124.3 kg)    Physical Exam  General appearance - alert, well appearing, and in no distress Mental status - normal mood, behavior, speech, dress, motor activity, and thought processes Eyes -  pupils equal and reactive, extraocular eye movements intact Mouth - mucous membranes moist, pharynx normal without lesions Neck - supple, no significant adenopathy Chest - clear to auscultation, no wheezes, rales or rhonchi, symmetric air entry Heart - normal rate, regular rhythm, normal S1, S2, no murmurs, rubs, clicks or gallops Extremities - peripheral pulses normal, no pedal edema, no clubbing or cyanosis Skin -she has discoloration that extends from below the right scapular around to the anterior chest on the right side.  Looks like this is from prior shingles.  No scabs or vesicles seen at this time. Diabetic Foot Exam - Simple   Simple Foot Form Visual Inspection No deformities, no ulcerations, no other skin breakdown bilaterally: Yes Sensation Testing Intact to touch and monofilament testing bilaterally: Yes Pulse Check Posterior Tibialis and Dorsalis pulse intact bilaterally: Yes Comments      Depression screen [facility] 2/9 05/18/2021 12/29/2020 12/24/2019  Decreased Interest 0 0 0  Down, Depressed, Hopeless 0 0 0  PHQ - 2 Score 0 0 0  Altered sleeping - - -  Tired, decreased energy - - -  Change in appetite - - -  Feeling bad or failure about yourself  - - -  Trouble concentrating - - -  Moving slowly or fidgety/restless - - -  Suicidal thoughts - - -  PHQ-9 Score - - -  Some recent data might be hidden    CMP Latest Ref Rng & Units 12/29/2020 11/23/2020 04/13/2020  Glucose 65 - 99 mg/dL 108(H) 94 163(H)  BUN 8 - 27 mg/dL '11 13 16  '$ Creatinine 0.57 - 1.00 mg/dL 1.40(H) 1.25(H) 1.06(H)  Sodium 134 - 144 mmol/L 143 138 135  Potassium 3.5 -  5.2 mmol/L 4.4 4.3 3.7  Chloride 96 - 106 mmol/L 102 102 101  CO2 20 - 29 mmol/L '23 21 24  '$ Calcium 8.7 - 10.3 mg/dL 10.2 9.2 8.4(L)  Total Protein 6.0 - 8.5 g/dL - 7.0 6.4(L)  Total Bilirubin 0.0 - 1.2 mg/dL - 0.5 0.8  Alkaline Phos 44 - 121 IU/L - 92 61  AST 0 - 40 IU/L - 16 30  ALT 0 - 32 IU/L - 12 34   Lipid Panel     Component  Value Date/Time   CHOL 157 11/23/2020 0905   TRIG 68 11/23/2020 0905   HDL 53 11/23/2020 0905   CHOLHDL 3.0 11/23/2020 0905   CHOLHDL 3.1 08/21/2018 1442   VLDL 6 08/21/2018 1442   LDLCALC 91 11/23/2020 0905    CBC    Component Value Date/Time   WBC 5.2 04/12/2020 2146   RBC 5.60 (H) 04/12/2020 2146   HGB 16.2 (H) 04/12/2020 2146   HGB 16.2 (H) 10/09/2019 1136   HCT 50.5 (H) 04/12/2020 2146   HCT 48.4 (H) 10/09/2019 1136   PLT 255 04/12/2020 2146   PLT 242 10/09/2019 1136   MCV 90.2 04/12/2020 2146   MCV 89 10/09/2019 1136   MCH 28.9 04/12/2020 2146   MCHC 32.1 04/12/2020 2146   RDW 13.2 04/12/2020 2146   RDW 12.3 10/09/2019 1136   LYMPHSABS 0.7 04/09/2020 0850   MONOABS 0.2 04/09/2020 0850   EOSABS 0.0 04/09/2020 0850   BASOSABS 0.0 04/09/2020 0850    ASSESSMENT AND PLAN: 1. Type 2 diabetes mellitus with morbid obesity ([place]) At goal.  Continue Farxiga. Dietary counseling given.  Encouraged her to eliminate sugary drinks from the diet, cut back on portion sizes of white carbohydrates and incorporate fresh fruits and vegetables into the diet.  Encouraged her to walk as much as she can.  Commended her on weight loss so far. - POCT glucose (manual entry) - POCT glycosylated hemoglobin (Hb A1C) - CBC - Comprehensive metabolic panel - Microalbumin / creatinine urine ratio  2. Hypertension associated with diabetes ([place]) Not at goal.  She has been out of one of her blood pressure medicine the Norvasc.  Refill given. - amLODipine (NORVASC) 5 MG tablet; Take 1 tablet (5 mg total) by mouth daily.  Dispense: 90 tablet; Refill: 3  3. Tobacco dependence Commended her on cutting back.  Encouraged her to continue working on trying to quit smoking.  She is aware of the ongoing health risks associated with smoking.  4. Coronary artery disease involving native coronary artery of native heart without angina pectoris Clinically stable.  She will continue aspirin, Plavix, statin, Zetia  and metoprolol.  5. Fibromyalgia - DULoxetine (CYMBALTA) 30 MG capsule; Take 1 capsule (30 mg total) by mouth daily.  Dispense: 30 capsule; Refill: 6  6. Vaginal itching We will treat empirically with Diflucan for yeast. - Cervicovaginal ancillary only - fluconazole (DIFLUCAN) 150 MG tablet; Take 1 tablet (150 mg total) by mouth daily.  Dispense: 1 tablet; Refill: 0  7. Loud snoring - PSG Sleep Study; Future  8. Other polyneuropathy - pregabalin (LYRICA) 75 MG capsule; Take 1 capsule (75 mg total) by mouth 2 (two) times daily.  Dispense: 60 capsule; Refill: 6  9. Need for vaccination against Streptococcus pneumoniae - Pneumococcal conjugate vaccine 20-valent  10. History of shingles Patient will follow up with the clinical pharmacist in 1 to 2 weeks for her first shingles shot  11. Need for immunization against influenza - Flu Vaccine  QUAD 17moIM (Fluarix, Fluzone & Alfiuria Quad PF)   Patient was given the opportunity to ask questions.  Patient verbalized understanding of the plan and was able to repeat key elements of the plan.   Orders Placed This Encounter  Procedures   Flu Vaccine QUAD 657moM (Fluarix, Fluzone & Alfiuria Quad PF)   Pneumococcal conjugate vaccine 20-valent   CBC   Comprehensive metabolic panel   Microalbumin / creatinine urine ratio   POCT glucose (manual entry)   POCT glycosylated hemoglobin (Hb A1C)   PSG Sleep Study     Requested Prescriptions   Signed Prescriptions Disp Refills   pregabalin (LYRICA) 75 MG capsule 60 capsule 6    Sig: Take 1 capsule (75 mg total) by mouth 2 (two) times daily.   DULoxetine (CYMBALTA) 30 MG capsule 30 capsule 6    Sig: Take 1 capsule (30 mg total) by mouth daily.   amLODipine (NORVASC) 5 MG tablet 90 tablet 3    Sig: Take 1 tablet (5 mg total) by mouth daily.   fluconazole (DIFLUCAN) 150 MG tablet 1 tablet 0    Sig: Take 1 tablet (150 mg total) by mouth daily.    Return in about 4 months (around 09/17/2021)  [facility].. Karle PlumberMD, FACP

## 2021-05-18 NOTE — Patient Instructions (Signed)
Your blood pressure is not at goal.  I have sent refills on the amlodipine to your pharmacy.  Please return to see the clinical pharmacist in 1 to 2 weeks to have your shingles vaccine.  I have sent a prescription to your pharmacy for Diflucan to treat possible vaginal yeast.

## 2021-05-19 ENCOUNTER — Telehealth: Payer: Self-pay

## 2021-05-19 LAB — MICROALBUMIN / CREATININE URINE RATIO
Creatinine, Urine: 188.2 mg/dL
Microalb/Creat Ratio: 9 mg/g creat (ref 0–29)
Microalbumin, Urine: 17.5 ug/mL

## 2021-05-19 LAB — CERVICOVAGINAL ANCILLARY ONLY
Bacterial Vaginitis (gardnerella): NEGATIVE
Candida Glabrata: NEGATIVE
Candida Vaginitis: POSITIVE — AB
Chlamydia: NEGATIVE
Comment: NEGATIVE
Comment: NEGATIVE
Comment: NEGATIVE
Comment: NEGATIVE
Comment: NEGATIVE
Comment: NORMAL
Neisseria Gonorrhea: NEGATIVE
Trichomonas: NEGATIVE

## 2021-05-19 LAB — CBC
Hematocrit: 50.9 % — ABNORMAL HIGH (ref 34.0–46.6)
Hemoglobin: 16.6 g/dL — ABNORMAL HIGH (ref 11.1–15.9)
MCH: 30.1 pg (ref 26.6–33.0)
MCHC: 32.6 g/dL (ref 31.5–35.7)
MCV: 92 fL (ref 79–97)
Platelets: 204 10*3/uL (ref 150–450)
RBC: 5.52 x10E6/uL — ABNORMAL HIGH (ref 3.77–5.28)
RDW: 13.5 % (ref 11.7–15.4)
WBC: 7.3 10*3/uL (ref 3.4–10.8)

## 2021-05-19 LAB — COMPREHENSIVE METABOLIC PANEL
ALT: 12 IU/L (ref 0–32)
AST: 13 IU/L (ref 0–40)
Albumin/Globulin Ratio: 1.2 (ref 1.2–2.2)
Albumin: 3.8 g/dL (ref 3.8–4.8)
Alkaline Phosphatase: 93 IU/L (ref 44–121)
BUN/Creatinine Ratio: 7 — ABNORMAL LOW (ref 12–28)
BUN: 10 mg/dL (ref 8–27)
Bilirubin Total: 0.7 mg/dL (ref 0.0–1.2)
CO2: 27 mmol/L (ref 20–29)
Calcium: 9.4 mg/dL (ref 8.7–10.3)
Chloride: 102 mmol/L (ref 96–106)
Creatinine, Ser: 1.49 mg/dL — ABNORMAL HIGH (ref 0.57–1.00)
Globulin, Total: 3.2 g/dL (ref 1.5–4.5)
Glucose: 79 mg/dL (ref 65–99)
Potassium: 4.1 mmol/L (ref 3.5–5.2)
Sodium: 140 mmol/L (ref 134–144)
Total Protein: 7 g/dL (ref 6.0–8.5)
eGFR: 40 mL/min/{1.73_m2} — ABNORMAL LOW (ref >59)

## 2021-05-19 NOTE — Telephone Encounter (Signed)
Contacted pt to go over lab results pt didn't answer was unable to leave voicemail. Sent a CRM and forward labs to NT to give pt labs when they call back

## 2021-05-19 NOTE — Progress Notes (Signed)
Kidney function is not 100% but stable. Her blood cell count is elevated which may be due to underlying sleep apnea.  I have submitted the referral for the sleep study as was discussed yesterday during her office visit.

## 2021-05-24 ENCOUNTER — Ambulatory Visit: Payer: Medicare Other | Admitting: Neurology

## 2021-05-24 ENCOUNTER — Telehealth: Payer: Self-pay | Admitting: Internal Medicine

## 2021-05-24 DIAGNOSIS — N898 Other specified noninflammatory disorders of vagina: Secondary | ICD-10-CM

## 2021-05-24 MED ORDER — FLUCONAZOLE 150 MG PO TABS: 150.0000 mg | ORAL_TABLET | Freq: Every day | ORAL | 0 refills | Status: DC

## 2021-05-24 NOTE — Telephone Encounter (Unsigned)
Copied from [place] [phone]. Topic: General - Other >> May 24, 2021  2:40 PM Celene Kras wrote: Reason for CRM: Pt called stating that she did not receive anything for her itch. She is requesting to have medication sent over for her. Please advise.     [address], [place] [address] Phone: [phone] Fax: [phone] Hours: Not open 24 hours

## 2021-05-25 NOTE — Telephone Encounter (Signed)
Contacted pt to go over provider response pt is aware and doesn't have any questions

## 2021-06-01 ENCOUNTER — Ambulatory Visit: Payer: Medicare Other | Admitting: Pharmacist

## 2021-06-03 ENCOUNTER — Encounter: Payer: Self-pay | Admitting: Neurology

## 2021-06-03 ENCOUNTER — Other Ambulatory Visit: Payer: Self-pay

## 2021-06-03 ENCOUNTER — Ambulatory Visit (INDEPENDENT_AMBULATORY_CARE_PROVIDER_SITE_OTHER): Payer: Medicare Other | Admitting: Neurology

## 2021-06-03 VITALS — BP 161/94 | HR 84 | Ht 64.0 in | Wt 256.0 lb

## 2021-06-03 DIAGNOSIS — G35 Multiple sclerosis: Secondary | ICD-10-CM

## 2021-06-03 DIAGNOSIS — R2681 Unsteadiness on feet: Secondary | ICD-10-CM | POA: Diagnosis not present

## 2021-06-03 MED ORDER — AMBULATORY NON FORMULARY MEDICATION: 0 refills | Status: AC

## 2021-06-03 NOTE — Progress Notes (Signed)
Follow-up Visit   Date: 06/03/21   Tiffany Brady MRN: [id number] DOB: 24-Jul-1961   Interim History: Tiffany Brady is a 60 y.o. right-handed African American female with chronic pain syndrome, fibromyaglia, hypertension, and tobacco use returning to the clinic for follow-up of multiple sclerosis and leg weakness.  The patient was accompanied to the clinic by self.  Her MS is doing well, no new disease activity on her imaging from March.  She is compliant with taken dimethyl fumerate.  No new neurological complaints.  She has ongoing generalized pain from fibromyalgia and leg instability.  MRI lumbar spine from March shows degenerative changes at L4-5 and L5-S1, which is stable. No nerve impingement.  She did not complete PT and expresses interest in doing this now.    She had shingles over the summer affecting the right thoracic dermatome.  Her pain has improved.  She has not had any falls.    Her daughter passed in May 2022 from advanced MS, she was living in [place].  She does not have any other children.   Medications:  Current Outpatient Medications on File Prior to Visit  Medication Sig Dispense Refill   Accu-Chek Softclix Lancets lancets AS DIRECTED DAILY 100 each 11   acyclovir (ZOVIRAX) 800 MG tablet Take 1 tablet (800 mg total) by mouth 5 (five) times daily. 35 tablet 0   AMBULATORY NON FORMULARY MEDICATION Upright walker 1 Product 0   amLODipine (NORVASC) 5 MG tablet Take 1 tablet (5 mg total) by mouth daily. 90 tablet 3   aspirin EC 81 MG tablet Take 1 tablet (81 mg total) by mouth daily. 100 tablet 1   atorvastatin (LIPITOR) 80 MG tablet TAKE ONE TABLET BY MOUTH DAILY AT 6PM. KEEP UPCOMING APPT IN [facility] FOR FURTHER REFILLS 30 tablet 11   cholecalciferol (VITAMIN D3) 25 MCG (1000 UT) tablet Take 5,000 Units by mouth daily.     clopidogrel (PLAVIX) 75 MG tablet TAKE ONE TABLET BY MOUTH DAILY 30 tablet 10   cyclobenzaprine (FLEXERIL) 10 MG tablet TAKE ONE TABLET BY MOUTH  DAILY AS NEEDED FOR MUSCLE SPASMS 30 tablet 3   diclofenac Sodium (VOLTAREN) 1 % GEL Apply 2 g topically 4 (four) times daily. 100 g 2   diclofenac Sodium (VOLTAREN) 1 % GEL APPLY TWO G TOPICALLY 4 (FOUR) TIMES DAILY. 100 g 2   DULoxetine (CYMBALTA) 30 MG capsule Take 1 capsule (30 mg total) by mouth daily. 30 capsule 6   ezetimibe (ZETIA) 10 MG tablet TAKE ONE TABLET BY MOUTH DAILY 90 tablet 3   FARXIGA 5 MG TABS tablet TAKE ONE TABLET BY MOUTH DAILY BEFORE BREAKFAST 30 tablet 3   fluconazole (DIFLUCAN) 150 MG tablet Take 1 tablet (150 mg total) by mouth daily. 1 tablet 0   fluticasone (FLONASE) 50 MCG/ACT nasal spray PLACE ONE SPRAY INTO BOTH NOSTRILS DAILY AS NEEDED FOR ALLERGIES OR RHINITIS (Patient taking differently: Place 1 spray into both nostrils daily as needed for allergies.) 16 g 0   losartan (COZAAR) 25 MG tablet Take 1 tablet (25 mg total) by mouth daily. Must have office visit for refills 30 tablet 0   metoprolol tartrate (LOPRESSOR) 50 MG tablet Take 50 mg by mouth 2 (two) times daily.     nitroGLYCERIN (NITROSTAT) 0.4 MG SL tablet Place 1 tablet (0.4 mg total) under the tongue every 5 (five) minutes x 3 doses as needed for chest pain. 25 tablet 12   NON FORMULARY Place into the nose continuous. 3 L  of O2 in the pm     pregabalin (LYRICA) 75 MG capsule Take 1 capsule (75 mg total) by mouth 2 (two) times daily. 60 capsule 6   TECFIDERA 240 MG CPDR TAKE 1 CAPSULE (240MG) BY  MOUTH TWICE DAILY 60 capsule 11   No current facility-administered medications on file prior to visit.    Allergies:  Allergies  Allergen Reactions   Pork-Derived Products Hives     Vital Signs:  BP (!) 161/94   Pulse 84   Ht 5' 4" (1.626 m)   Wt 256 lb (116.1 kg)   LMP 04/21/2014   SpO2 91%   BMI 43.94 kg/m   Neurological Exam: MENTAL STATUS including orientation to time, place, person, recent and remote memory, attention span and concentration, language, and fund of knowledge is normal.  Speech  is not dysarthric.  CRANIAL NERVES: No visual field defects. Pupils equal round and reactive to light.  Normal conjugate, extra-ocular eye movements in all directions of gaze.  No ptosis.    MOTOR:  Motor strength is 5/5 in all extremities, except bilateral hip flexion shows 4/5.  No pronator drift.  Tone is normal.    MSRs:  Reflexes are 3+/4 throughout.  SENSORY:  Intact to vibration.  COORDINATION/GAIT:    Gait is wide-based, unsteady, unassisted, she has more stability with rollator  Data: Records received from [facility] in [place]  MRI brain with and without contrast 10/26/2016: Extensive periventricular hyperintensity which demonstrate distribution and morphology consistent with multiple sclerosis. There is no evidence of enhancement to suggest acute demyelination.  MRI cervical spine wwo contrast 07/31/2015: Multilevel degenerative change in the cervical spine with mild spinal stenosis at C3-4, C4-5, C5-6. Left foraminal narrowing at C5-6 due to disc and osteophyte.  Spinal cord appears normal without cord lesion or abnormal enhancement. No cord compression.  MRI brain wwo contrast 07/24/2015: Extensive and premature for age white matter signal abnormality strongly suspicious for multiple sclerosis. Chronic microvascular ischemic changes are not excluded, but are less favored.   No restricted diffusion or abnormal postcontrast enhancement at any location, but specifically in association with the white matter, to suggest an acute process.   Cervical spondylosis with suspected central disc protrusion/extrusion at C4-5 resulting in cord compression. This could contribute to numbness and pain in the extremities. Correlate clinically for radiculopathy or myelopathy. MRI cervical spine may be helpful in further evaluation as clinically indicated.  EMG of the upper extremities 05/26/2015: Bilateral median neuropathy at or distal to the wrist, consistent with clinical  diagnosis of carpal tunnel syndrome. Overall, these findings are mild in degree electrically and worse on the right. There is no evidence of a cervical radiculopathy or diffuse myopathy affecting upper extremities.  MRI brain wwo contrast 02/01/2018: Numerous small periventricular white matter hyperintensities are stable since 2016 and are consistent with the clinical diagnosis of multiple sclerosis. No active lesions identified. Interval development of chronic microhemorrhage in the left thalamus and left occipital lobe, question hypertension   Labs 09/29/2014:  RF 12, ANA neg, ESR 23  CSF testing 08/14/2015:  R34 W0 P37 G67, IgG index 2.8, MPB < 2.0, OCB  >5 well defined gamma restriction bands that are also present in the patient's corresponding serum sample, but some bands in the CSF are more prominent  MRI lumbar spine wo contrast 12/10/3020: Lumbar degenerative changes most prominent L4-5 and L5-S1. No change from the prior MRI 2017.  MRI brain wwo contrast 12/10/2020: Moderate white matter changes in  this patient with known multiple sclerosis. No new lesions and no areas of acute demyelinization. No change from the prior study.   Chronic microhemorrhage stable, likely due to hypertension.  IMPRESSION/PLAN: 1.  Relapsing remitting multiple sclerosis (diagnosed 2016) with disease burden involving the periventricular white matter.  She has been on DMT since 2017 with Tecfidera with no progression of disease.   - Continue Tecfidera 280m BID  - Continue vitamin D  2.  Gait instability  - Start out-patient PT  - Prescription given for 4-wheeled rollator  Return to clinic in 6 months  Total time spent reviewing records, interview, history/exam, documentation, and coordination of care on day of encounter:  20 min    Thank you for allowing me to participate in patient's care.  If I can answer any additional questions, I would be pleased to do so.    Sincerely,    Donika K. PPosey Pronto  DO

## 2021-06-03 NOTE — Patient Instructions (Signed)
Start physical therapy for balance training  Return to clinic in 6 months

## 2021-07-05 ENCOUNTER — Other Ambulatory Visit: Payer: Self-pay | Admitting: Internal Medicine

## 2021-07-05 DIAGNOSIS — M654 Radial styloid tenosynovitis [de Quervain]: Secondary | ICD-10-CM

## 2021-07-05 NOTE — Telephone Encounter (Signed)
Requested Prescriptions  Pending Prescriptions Disp Refills  . diclofenac Sodium (VOLTAREN) 1 % GEL [Pharmacy Med Name: DICLOFENAC SODIUM 1% GEL] 100 g 1    Sig: APPLY TWO GRAMS TOPICALLY FOUR TIMES A DAY     Analgesics:  Topicals Passed - 07/05/2021 12:13 PM      Passed - Valid encounter within last 12 months    Recent Outpatient Visits          1 month ago Type 2 diabetes mellitus with morbid obesity ([place])   [place] Karle Plumber B, MD   6 months ago Type 2 diabetes mellitus with morbid obesity [facility])   [place] Lad[place] Pier, MD   1 year ago SOB (shortness of breath)   [place], Deborah B, MD   1 year ago Hospital discharge follow-up   [place], Deborah B, MD   1 year ago Controlled type 2 diabetes mellitus with other circulatory complication, without long-term current use of insulin [facility])   [place], MD      Future Appointments            In 2 months Wynetta Emery Dalbert Batman, MD [place]

## 2021-07-09 ENCOUNTER — Telehealth: Payer: Self-pay | Admitting: Internal Medicine

## 2021-07-09 NOTE — Telephone Encounter (Signed)
Copied from [place] [phone]. Topic: General - Other >> Jul 07, 2021 10:18 AM Leward Quan A wrote: Reason for CRM: Patient called in to inquire if Dr Wynetta Emery can please send an Rx to the pharmacy for her state that she have a bacterial infection with vaginal discharge. Patient can be reached at  Ph# [phone]

## 2021-07-12 ENCOUNTER — Other Ambulatory Visit: Payer: Self-pay

## 2021-07-12 ENCOUNTER — Ambulatory Visit: Payer: Medicare Other | Attending: Internal Medicine | Admitting: Internal Medicine

## 2021-07-12 DIAGNOSIS — N898 Other specified noninflammatory disorders of vagina: Secondary | ICD-10-CM

## 2021-07-12 DIAGNOSIS — Z23 Encounter for immunization: Secondary | ICD-10-CM

## 2021-07-12 MED ORDER — FLUCONAZOLE 150 MG PO TABS: 150.0000 mg | ORAL_TABLET | Freq: Every day | ORAL | 0 refills | Status: AC

## 2021-07-12 NOTE — Telephone Encounter (Signed)
Will forward to provider  

## 2021-07-12 NOTE — Progress Notes (Signed)
Virtual Visit via Telephone Note  I connected with Tiffany Brady on 07/12/2021 at 2:30 p.m by telephone and verified that I am speaking with the correct person using two identifiers  Location: Patient: home Provider: office  Participants: Myself Patient CMA: Ms. Maryclare Bean interpreter:   I discussed the limitations, risks, security and privacy concerns of performing an evaluation and management service by telephone and the availability of in person appointments. I also discussed with the patient that there may be a patient responsible charge related to this service. The patient expressed understanding and agreed to proceed.   History of Present Illness: Pt with hx of DM type 2, HTN, Tob dep, CAD, obesity, fibromyalgia, CKD stage 3 and multiple sclerosis, COVID pneumonia 03/2020.  This is an UC visit for vaginal dischg,  C/o white vaginal dischg since last wk No odor but itches and burns.  No dysuria. Sexually active with same female partner.  Reports her partner was recently on antibiotics for 7 days for bacterial infection of the penis.  She does not know whether it was trichomonas or chlamydia.   On last visit with me patient complained of vaginal discharge and was full of a yeast infection.  She was treated with Diflucan.    Observations/Objective: No direct observation done as this was a telephone encounter  Assessment and Plan: 1. Vaginal discharge I told patient that since she is having itching I will go ahead and give her another dose of Diflucan for possible yeast.  However given that her partner was recently on antibiotic for some type of genital infection, I recommend that she returns to the lab to do a self swab.  She is agreeable to doing this. - fluconazole (DIFLUCAN) 150 MG tablet; Take 1 tablet (150 mg total) by mouth daily.  Dispense: 1 tablet; Refill: 0 - Cervicovaginal ancillary only; Future  2.  Need for shingles vaccine Patient states she missed her  appointment to get this shingles vaccine.  She would like to get the first shingles shot when she comes this week to go to the lab.  Message sent to my CMA to have her scheduled as a nurse only visit or with clinical pharmacist.  Follow Up Instructions: PRN   I discussed the assessment and treatment plan with the patient. The patient was provided an opportunity to ask questions and all were answered. The patient agreed with the plan and demonstrated an understanding of the instructions.   The patient was advised to call back or seek an in-person evaluation if the symptoms worsen or if the condition fails to improve as anticipated.  I  Spent 6 minutes on this telephone encounter  Karle Plumber, MD

## 2021-07-14 ENCOUNTER — Ambulatory Visit: Payer: Medicare Other | Admitting: Pharmacist

## 2021-07-16 ENCOUNTER — Encounter (HOSPITAL_BASED_OUTPATIENT_CLINIC_OR_DEPARTMENT_OTHER): Payer: Medicare Other | Admitting: Internal Medicine

## 2021-07-16 ENCOUNTER — Ambulatory Visit: Payer: Self-pay | Admitting: Physical Therapy

## 2021-08-13 ENCOUNTER — Ambulatory Visit: Payer: Medicare Other | Attending: Neurology | Admitting: Physical Therapy

## 2021-08-23 ENCOUNTER — Other Ambulatory Visit: Payer: Self-pay | Admitting: Internal Medicine

## 2021-08-23 ENCOUNTER — Encounter (HOSPITAL_BASED_OUTPATIENT_CLINIC_OR_DEPARTMENT_OTHER): Payer: Medicare Other | Admitting: Internal Medicine

## 2021-08-23 DIAGNOSIS — G35 Multiple sclerosis: Secondary | ICD-10-CM

## 2021-08-23 DIAGNOSIS — M654 Radial styloid tenosynovitis [de Quervain]: Secondary | ICD-10-CM

## 2021-08-23 NOTE — Telephone Encounter (Signed)
Requested medications are due for refill today.  yes  Requested medications are on the active medications list.  yes  Last refill. 05/20/2021  Future visit scheduled.   yes  Notes to clinic.  Medication not delegated.    Requested Prescriptions  Pending Prescriptions Disp Refills   cyclobenzaprine (FLEXERIL) 10 MG tablet [Pharmacy Med Name: CYCLOBENZAPRINE HYDR 10MG TAB] 30 tablet 2    Sig: TAKE ONE TABLET BY MOUTH DAILY AS NEEDED FOR MUSCLE SPASMS     Not Delegated - Analgesics:  Muscle Relaxants Failed - 08/23/2021  2:50 PM      Failed - This refill cannot be delegated      Passed - Valid encounter within last 6 months    Recent Outpatient Visits           1 month ago Vaginal discharge   [place] Karle Plumber B, MD   3 months ago Type 2 diabetes mellitus with morbid obesity ([place])   [place] Karle Plumber B, MD   7 months ago Type 2 diabetes mellitus with morbid obesity ([place])   [place] Ladell Pier, MD   1 year ago SOB (shortness of breath)   [place], Deborah B, MD   1 year ago Hospital discharge follow-up   [place], MD       Future Appointments             In 3 weeks Ladell Pier, MD [place]            Signed Prescriptions Disp Refills   diclofenac Sodium (VOLTAREN) 1 % GEL 100 g 0    Sig: APPLY TWO GRAMS TOPICALLY FOUR TIMES A DAY     Analgesics:  Topicals Passed - 08/23/2021  2:50 PM      Passed - Valid encounter within last 12 months    Recent Outpatient Visits           1 month ago Vaginal discharge   [place], Deborah B, MD   3 months ago Type 2 diabetes mellitus with morbid obesity ([place])   [place] Karle Plumber B, MD   7 months  ago Type 2 diabetes mellitus with morbid obesity [facility])   [place] Ladell Pier, MD   1 year ago SOB (shortness of breath)   [place] Ladell Pier, MD   1 year ago Hospital discharge follow-up   [place], Deborah B, MD       Future Appointments             In 3 weeks Ladell Pier, MD [place] 5 MG TABS tablet 30 tablet 2    Sig: TAKE ONE TABLET BY MOUTH DAILY BEFORE BREAKFAST     Endocrinology:  Diabetes - SGLT2 Inhibitors Failed - 08/23/2021  2:50 PM      Failed - Cr in normal range and within 360 days    Creat  Date Value Ref Range Status  03/28/2016 1.40 (H) 0.50 - 1.05 mg/dL Final    Comment:      For patients > or = 61 years  of age: The upper reference limit for Creatinine is approximately 13% higher for people identified as African-American.      Creatinine, Ser  Date Value Ref Range Status  05/18/2021 1.49 (H) 0.57 - 1.00 mg/dL Final   Creatinine, Urine  Date Value Ref Range Status  02/01/2016 276 20 - 320 mg/dL Final          Failed - AA eGFR in normal range and within 360 days    GFR, Est African American  Date Value Ref Range Status  03/28/2016 48 (L) >=60 mL/min Final   GFR calc Af Amer  Date Value Ref Range Status  04/13/2020 >60 >60 mL/min Final   GFR, Est Non African American  Date Value Ref Range Status  03/28/2016 42 (L) >=60 mL/min Final   GFR calc non Af Amer  Date Value Ref Range Status  04/13/2020 57 (L) >60 mL/min Final   GFR  Date Value Ref Range Status  09/22/2017 45.19 (L) >60.00 mL/min Final   eGFR  Date Value Ref Range Status  05/18/2021 40 (L) >59 mL/min/1.73 Final          Passed - LDL in normal range and within 360 days    LDL Chol Calc (NIH)  Date Value Ref Range Status  11/23/2020 91 0 - 99 mg/dL Final          Passed - HBA1C is  between 0 and 7.9 and within 180 days    HbA1c, POC (prediabetic range)  Date Value Ref Range Status  12/24/2019 6.4 5.7 - 6.4 % Final   HbA1c, POC (controlled diabetic range)  Date Value Ref Range Status  05/18/2021 6.6 0.0 - 7.0 % Final          Passed - Valid encounter within last 6 months    Recent Outpatient Visits           1 month ago Vaginal discharge   [place], Deborah B, MD   3 months ago Type 2 diabetes mellitus with morbid obesity ([place])   [place] Karle Plumber B, MD   7 months ago Type 2 diabetes mellitus with morbid obesity [facility])   [place], Deborah B, MD   1 year ago SOB (shortness of breath)   [place], Deborah B, MD   1 year ago Hospital discharge follow-up   [place], Deborah B, MD       Future Appointments             In 3 weeks Ladell Pier, MD [place]

## 2021-08-23 NOTE — Telephone Encounter (Signed)
Requested Prescriptions  Pending Prescriptions Disp Refills  . diclofenac Sodium (VOLTAREN) 1 % GEL [Pharmacy Med Name: DICLOFENAC SODIUM 1% GEL] 100 g 0    Sig: APPLY TWO GRAMS TOPICALLY FOUR TIMES A DAY     Analgesics:  Topicals Passed - 08/23/2021  2:50 PM      Passed - Valid encounter within last 12 months    Recent Outpatient Visits          1 month ago Vaginal discharge   [place], MD   3 months ago Type 2 diabetes mellitus with morbid obesity [facility])   [place], MD   7 months ago Type 2 diabetes mellitus with morbid obesity [facility])   [place], MD   1 year ago SOB (shortness of breath)   [place] Ladell Pier, MD   1 year ago Hospital discharge follow-up   [place], MD      Future Appointments            In 3 weeks Ladell Pier, MD [place]           . cyclobenzaprine (FLEXERIL) 10 MG tablet [Pharmacy Med Name: CYCLOBENZAPRINE HYDR 10MG TAB] 30 tablet 2    Sig: TAKE ONE TABLET BY MOUTH DAILY AS NEEDED FOR MUSCLE SPASMS     Not Delegated - Analgesics:  Muscle Relaxants Failed - 08/23/2021  2:50 PM      Failed - This refill cannot be delegated      Passed - Valid encounter within last 6 months    Recent Outpatient Visits          1 month ago Vaginal discharge   [place] Karle Plumber B, MD   3 months ago Type 2 diabetes mellitus with morbid obesity ([place])   [place], MD   7 months ago Type 2 diabetes mellitus with morbid obesity [facility])   [place], MD   1 year ago SOB (shortness of breath)   [place], Deborah B, MD    1 year ago Hospital discharge follow-up   [place], MD      Future Appointments            In 3 weeks Ladell Pier, MD [place]           . FARXIGA 5 MG TABS tablet [Pharmacy Med Name: FARXIGA 5MG TAB] 30 tablet 2    Sig: TAKE ONE TABLET BY MOUTH DAILY BEFORE BREAKFAST     Endocrinology:  Diabetes - SGLT2 Inhibitors Failed - 08/23/2021  2:50 PM      Failed - Cr in normal range and within 360 days    Creat  Date Value Ref Range Status  03/28/2016 1.40 (H) 0.50 - 1.05 mg/dL Final    Comment:      For patients > or = 61 years of age: The upper reference limit for Creatinine is approximately 13% higher for people identified as African-American.      Creatinine, Ser  Date Value Ref Range Status  05/18/2021 1.49 (H) 0.57 - 1.00 mg/dL Final   Creatinine,  Urine  Date Value Ref Range Status  02/01/2016 276 20 - 320 mg/dL Final         Failed - AA eGFR in normal range and within 360 days    GFR, Est African American  Date Value Ref Range Status  03/28/2016 48 (L) >=60 mL/min Final   GFR calc Af Amer  Date Value Ref Range Status  04/13/2020 >60 >60 mL/min Final   GFR, Est Non African American  Date Value Ref Range Status  03/28/2016 42 (L) >=60 mL/min Final   GFR calc non Af Amer  Date Value Ref Range Status  04/13/2020 57 (L) >60 mL/min Final   GFR  Date Value Ref Range Status  09/22/2017 45.19 (L) >60.00 mL/min Final   eGFR  Date Value Ref Range Status  05/18/2021 40 (L) >59 mL/min/1.73 Final         Passed - LDL in normal range and within 360 days    LDL Chol Calc (NIH)  Date Value Ref Range Status  11/23/2020 91 0 - 99 mg/dL Final         Passed - HBA1C is between 0 and 7.9 and within 180 days    HbA1c, POC (prediabetic range)  Date Value Ref Range Status  12/24/2019 6.4 5.7 - 6.4 % Final   HbA1c, POC (controlled diabetic range)  Date Value Ref Range  Status  05/18/2021 6.6 0.0 - 7.0 % Final         Passed - Valid encounter within last 6 months    Recent Outpatient Visits          1 month ago Vaginal discharge   [place], Deborah B, MD   3 months ago Type 2 diabetes mellitus with morbid obesity ([place])   [place] Karle Plumber B, MD   7 months ago Type 2 diabetes mellitus with morbid obesity [facility])   [place], Deborah B, MD   1 year ago SOB (shortness of breath)   [place], Deborah B, MD   1 year ago Hospital discharge follow-up   [place], Deborah B, MD      Future Appointments            In 3 weeks Ladell Pier, MD [place]

## 2021-09-17 ENCOUNTER — Ambulatory Visit: Payer: Medicare Other | Admitting: Internal Medicine

## 2021-09-28 ENCOUNTER — Other Ambulatory Visit: Payer: Self-pay | Admitting: Internal Medicine

## 2021-09-28 DIAGNOSIS — M654 Radial styloid tenosynovitis [de Quervain]: Secondary | ICD-10-CM

## 2021-09-28 NOTE — Telephone Encounter (Signed)
Requested Prescriptions  Pending Prescriptions Disp Refills   diclofenac Sodium (VOLTAREN) 1 % GEL [Pharmacy Med Name: DICLOFENAC SODIUM 1% GEL] 100 g 0    Sig: APPLY TWO GRAMS TOPICALLY FOUR TIMES A DAY     Analgesics:  Topicals Passed - 09/28/2021  1:20 PM      Passed - Valid encounter within last 12 months    Recent Outpatient Visits          2 months ago Vaginal discharge   [place], MD   4 months ago Type 2 diabetes mellitus with morbid obesity [facility])   [place], MD   9 months ago Type 2 diabetes mellitus with morbid obesity [facility])   [place], MD   1 year ago SOB (shortness of breath)   [place], Deborah B, MD   1 year ago Hospital discharge follow-up   [place], MD      Future Appointments            In 1 month Wynetta Emery Dalbert Batman, MD [place]

## 2021-09-30 ENCOUNTER — Telehealth: Payer: Self-pay

## 2021-09-30 NOTE — Telephone Encounter (Signed)
Contacted pt to schedule Medicare Wellness pt didn't answer lvm   °

## 2021-10-04 ENCOUNTER — Ambulatory Visit (HOSPITAL_BASED_OUTPATIENT_CLINIC_OR_DEPARTMENT_OTHER): Payer: Medicare Other | Attending: Internal Medicine | Admitting: Internal Medicine

## 2021-10-20 ENCOUNTER — Telehealth: Payer: Self-pay

## 2021-10-20 NOTE — Telephone Encounter (Signed)
New message   Received fax from [place] of request cancelled   Medication Dimethyl Fum Cap 240 MG   This medication or product is on your plan's list of covered drugs. Prior Authorization is not required at this time.   If your pharmacy has questions regarding the processing of your prescription, please have them call the [facility] pharmacy help desk at [phone]

## 2021-10-21 MED ORDER — DIMETHYL FUMARATE 240 MG PO CPDR: 240.0000 mg | DELAYED_RELEASE_CAPSULE | Freq: Two times a day (BID) | ORAL | 3 refills | Status: DC

## 2021-10-21 NOTE — Telephone Encounter (Signed)
Called patient and informed her that we have sent in Dimethyl Fumerate to [facility] due to her insurance not covering Tecfidera. Patient verbalized understanding and had no questions or concerns.

## 2021-10-21 NOTE — Telephone Encounter (Signed)
Rx sent to [facility] for dimethyl fumerate 240mg  BID.  Please let pt know insurance has requested [name] to change to the generic formula of Tecfidera. Thanks.

## 2021-10-21 NOTE — Addendum Note (Signed)
Addended by: [name] on: 10/21/2021 08:42 AM   Modules accepted: Orders

## 2021-10-22 ENCOUNTER — Other Ambulatory Visit: Payer: Self-pay | Admitting: Internal Medicine

## 2021-10-22 DIAGNOSIS — G6289 Other specified polyneuropathies: Secondary | ICD-10-CM

## 2021-11-05 ENCOUNTER — Other Ambulatory Visit: Payer: Self-pay | Admitting: Internal Medicine

## 2021-11-05 DIAGNOSIS — Z1231 Encounter for screening mammogram for malignant neoplasm of breast: Secondary | ICD-10-CM

## 2021-11-09 ENCOUNTER — Other Ambulatory Visit: Payer: Self-pay | Admitting: Physician Assistant

## 2021-11-09 ENCOUNTER — Other Ambulatory Visit: Payer: Self-pay | Admitting: Internal Medicine

## 2021-11-10 ENCOUNTER — Ambulatory Visit: Payer: Medicare Other | Admitting: Obstetrics & Gynecology

## 2021-11-10 NOTE — Telephone Encounter (Signed)
Requested Prescriptions  ?Pending Prescriptions Disp Refills  ?? FARXIGA 5 MG TABS tablet [Pharmacy Med Name: FARXIGA 5MG TAB] 90 tablet 0  ?  Sig: TAKE ONE TABLET BY MOUTH DAILY BEFORE BREAKFAST  ?  ? Endocrinology:  Diabetes - SGLT2 Inhibitors Failed - 11/09/2021  5:58 PM  ?  ?  Failed - Cr in normal range and within 360 days  ?  Creat  ?Date Value Ref Range Status  ?03/28/2016 1.40 (H) 0.50 - 1.05 mg/dL Final  ?  Comment:  ?    ?For patients > or = 62 years of age: The upper reference limit for ?Creatinine is approximately 13% higher for people identified as ?African-American. ?  ?  ? ?Creatinine, Ser  ?Date Value Ref Range Status  ?05/18/2021 1.49 (H) 0.57 - 1.00 mg/dL Final  ? ?Creatinine, Urine  ?Date Value Ref Range Status  ?02/01/2016 276 20 - 320 mg/dL Final  ?   ?  ?  Failed - eGFR in normal range and within 360 days  ?  GFR, Est African American  ?Date Value Ref Range Status  ?03/28/2016 48 (L) >=60 mL/min Final  ? ?GFR calc Af Amer  ?Date Value Ref Range Status  ?04/13/2020 >60 >60 mL/min Final  ? ?GFR, Est Non African American  ?Date Value Ref Range Status  ?03/28/2016 42 (L) >=60 mL/min Final  ? ?GFR calc non Af Amer  ?Date Value Ref Range Status  ?04/13/2020 57 (L) >60 mL/min Final  ? ?GFR  ?Date Value Ref Range Status  ?09/22/2017 45.19 (L) >60.00 mL/min Final  ? ?eGFR  ?Date Value Ref Range Status  ?05/18/2021 40 (L) >59 mL/min/1.73 Final  ?   ?  ?  Passed - HBA1C is between 0 and 7.9 and within 180 days  ?  HbA1c, POC (prediabetic range)  ?Date Value Ref Range Status  ?12/24/2019 6.4 5.7 - 6.4 % Final  ? ?HbA1c, POC (controlled diabetic range)  ?Date Value Ref Range Status  ?05/18/2021 6.6 0.0 - 7.0 % Final  ?   ?  ?  Passed - Valid encounter within last 6 months  ?  Recent Outpatient Visits   ?      ? 4 months ago Vaginal discharge  ? [place] Karle Plumber B, MD  ? 5 months ago Type 2 diabetes mellitus with morbid obesity ([place])  ? [place] Ladell Pier, MD  ? 10 months ago Type 2 diabetes mellitus with morbid obesity ([place])  ? [place] Karle Plumber B, MD  ? 1 year ago SOB (shortness of breath)  ? [place] Ladell Pier, MD  ? 1 year ago Hospital discharge follow-up  ? [place] Ladell Pier, MD  ?  ?  ?Future Appointments   ?        ? In 1 week Ladell Pier, MD [place]  ?  ? ?  ?  ?  ? ? ?

## 2021-11-19 ENCOUNTER — Ambulatory Visit: Payer: Medicare Other | Admitting: Internal Medicine

## 2021-11-22 ENCOUNTER — Ambulatory Visit: Payer: Medicare Other

## 2021-11-24 ENCOUNTER — Ambulatory Visit: Payer: Medicare Other | Admitting: Obstetrics & Gynecology

## 2021-11-24 DIAGNOSIS — Z0289 Encounter for other administrative examinations: Secondary | ICD-10-CM

## 2021-12-01 ENCOUNTER — Encounter: Payer: Self-pay | Admitting: Neurology

## 2021-12-01 ENCOUNTER — Telehealth (INDEPENDENT_AMBULATORY_CARE_PROVIDER_SITE_OTHER): Payer: Medicare Other | Admitting: Neurology

## 2021-12-01 VITALS — Ht 64.0 in | Wt 262.0 lb

## 2021-12-01 DIAGNOSIS — M79604 Pain in right leg: Secondary | ICD-10-CM | POA: Diagnosis not present

## 2021-12-01 DIAGNOSIS — G35 Multiple sclerosis: Secondary | ICD-10-CM

## 2021-12-01 DIAGNOSIS — M79605 Pain in left leg: Secondary | ICD-10-CM

## 2021-12-01 DIAGNOSIS — R2681 Unsteadiness on feet: Secondary | ICD-10-CM | POA: Diagnosis not present

## 2021-12-01 NOTE — Progress Notes (Signed)
? ?Due to the COVID-19 crisis, this telephone visit was done via telephone from my office and it was initiated and consent given by this patient and or family. ? ?Telephone (Audio) Visit ?The purpose of this telephone visit is to provide medical care while limiting exposure to the novel coronavirus.   ? ?Consent was obtained for telephone visit and initiated by pt/family:  Yes.   ?Answered questions that patient had about telehealth interaction:  Yes.   ?I discussed the limitations, risks, security and privacy concerns of performing an evaluation and management service by telephone. I also discussed with the patient that there may be a patient responsible charge related to this service. The patient expressed understanding and agreed to proceed. ? ?Pt location: Home ?Physician Location: office ?Name of referring provider:  Ladell Pier, MD ?I connected with .Tiffany Brady at patients initiation/request on 12/01/2021 at  8:50 AM EDT by telephone and verified that I am speaking with the correct person using two identifiers.  ?Pt MRN:  [id number] ?Pt DOB:  1961-08-14 ? ?Assessment/Plan:  ? ?Multiple sclerosis, relapsing-remitting (diagnosed 2016) with disease burden involving the periventricular white matter.  She has been on DMT since 2017 with Tecfidera and switched to generic in 2022, which she is tolerating. No progression of disease on imaging.  ? - Continue dimethyl fumerate '240mg'$  twice daily ? - MRI brain wwo contrast ? ?2.  Gait instability with bilateral leg pain.  MRI lumbar spine from 2022 shows degenerative changes worse at L4-5 and L5-S1 with biformainal stenosis and endplate spurring.  Unfortunately, she has been contacted for out-patient PT, but given that she does not drive, I will make the referral for home PT.   Continue to use walker ? ?Need for in person visit now:  No. ? ?Return to clinic in 6 months ? ?Subjective:  ? ?This is a 61 y.o. female returning for follow-up of multiple sclerosis.  Her insurance stopped covering Tecfidera so has been switched to dimethyl fumerate which she is tolerating.  She denies any new headache, vision changes, or numbness/tingling.  ?She is due for annual surveillance imaging.  ? ?She continues to have bilateral leg weakness and imbalance, sensation of "wobbly legs".  She has chronic pain from fibromyalgia and also complains of throbbing leg pain.  At her last visit, I referred her to PT for gait training due to leg weakness and imbalance.  Unfortunately, she was never contacted so did not start this.  She is compliant with using a walker.  No interval falls.  She does not drive and is dependent on others for a ride, which has been very unreliable.   ? ? ?  ?Objective:  ? ?Vitals:  ? 12/01/21 0823  ?Weight: 262 lb (118.8 kg)  ?Height: '5\' 4"'$  (1.626 m)  ? ? ? ?Follow Up Instructions:  ? ?  ?I discussed the assessment and treatment plan with the patient. The patient was provided an opportunity to ask questions and all were answered. The patient agreed with the plan and demonstrated an understanding of the instructions. ?  ?The patient was advised to call back or seek an in-person evaluation if the symptoms worsen or if the condition fails to improve as anticipated. ? ? ? ?Total Time spent in visit with the patient was:  20 min, of which 100% of the time was spent in counseling and/or coordinating care.   Pt understands and agrees with the plan of care outlined.   ? ? ?[name], DO ? ? ? ? ? ?  ? ?  ?

## 2021-12-01 NOTE — Addendum Note (Signed)
Addended by: Armen Pickup A on: 12/01/2021 11:30 AM ? ? Modules accepted: Orders ? ?

## 2021-12-03 ENCOUNTER — Other Ambulatory Visit: Payer: Self-pay | Admitting: Physician Assistant

## 2021-12-03 ENCOUNTER — Other Ambulatory Visit: Payer: Self-pay | Admitting: Internal Medicine

## 2021-12-03 DIAGNOSIS — G35 Multiple sclerosis: Secondary | ICD-10-CM

## 2021-12-03 DIAGNOSIS — M797 Fibromyalgia: Secondary | ICD-10-CM

## 2021-12-06 NOTE — Addendum Note (Signed)
Addended by: Armen Pickup A on: 12/06/2021 09:54 AM ? ? Modules accepted: Orders ? ?

## 2021-12-10 NOTE — Addendum Note (Signed)
Addended by: Armen Pickup A on: 12/10/2021 08:08 AM ? ? Modules accepted: Orders ? ?

## 2021-12-12 ENCOUNTER — Encounter (HOSPITAL_BASED_OUTPATIENT_CLINIC_OR_DEPARTMENT_OTHER): Payer: Medicare Other | Admitting: Internal Medicine

## 2021-12-14 ENCOUNTER — Ambulatory Visit (HOSPITAL_BASED_OUTPATIENT_CLINIC_OR_DEPARTMENT_OTHER): Payer: Medicare Other | Attending: Internal Medicine | Admitting: Internal Medicine

## 2021-12-15 ENCOUNTER — Ambulatory Visit: Payer: Medicare Other

## 2021-12-21 ENCOUNTER — Ambulatory Visit
Admission: RE | Admit: 2021-12-21 | Discharge: 2021-12-21 | Disposition: A | Discharge status: Home / Self Care | Payer: Medicare Other | Source: Ambulatory Visit | Attending: Neurology | Admitting: Neurology

## 2021-12-21 ENCOUNTER — Other Ambulatory Visit: Payer: Self-pay | Admitting: Physician Assistant

## 2021-12-21 DIAGNOSIS — G35 Multiple sclerosis: Secondary | ICD-10-CM | POA: Diagnosis not present

## 2021-12-21 MED ORDER — GADOBENATE DIMEGLUMINE 529 MG/ML IV SOLN
20.0000 mL | Freq: Once | INTRAVENOUS | Status: AC | PRN
  Administered 2021-12-21: 20 mL via INTRAVENOUS

## 2021-12-22 ENCOUNTER — Telehealth: Payer: Self-pay

## 2021-12-22 DIAGNOSIS — M79605 Pain in left leg: Secondary | ICD-10-CM

## 2021-12-22 DIAGNOSIS — R2681 Unsteadiness on feet: Secondary | ICD-10-CM

## 2021-12-22 NOTE — Telephone Encounter (Signed)
Called patient and informed her that Home health agency's are not willing to take her insurance at this time. Asked patient if it is ok to send a referral to a [facility] facility? Patient is ok with this and will find out how many rides she has. Patient aware I will send referral.  ?

## 2021-12-22 NOTE — Telephone Encounter (Signed)
-----   Message from [name], DO sent at 12/10/2021 11:22 AM EDT ----- ?That's unfortunately.  The only other options is [facility] out-patient PT on [address], if she is able to arrange transportation there.  ? ?----- Message ----- ?From: Nira Conn, CMA ?Sent: 12/10/2021   8:44 AM EDT ?To: [name], DO ? ?Dr. Posey Pronto- ? ?Every place I have reached out to has turned patient down for Home PT due to her insurance. Her insurance is a bad payor and they want a good payor to pair it with before they will take patient. There are some other smaller home health agencies I can try to contact. I wanted to let you know.  ? ?Thank you, ?Tiffany Brady ? ? ?

## 2022-01-04 DIAGNOSIS — Z5181 Encounter for therapeutic drug level monitoring: Secondary | ICD-10-CM | POA: Diagnosis not present

## 2022-01-10 ENCOUNTER — Ambulatory Visit: Payer: Medicare Other | Attending: Neurology

## 2022-01-10 VITALS — BP 136/81 | HR 79

## 2022-01-10 DIAGNOSIS — M79605 Pain in left leg: Secondary | ICD-10-CM | POA: Insufficient documentation

## 2022-01-10 DIAGNOSIS — M6281 Muscle weakness (generalized): Secondary | ICD-10-CM | POA: Insufficient documentation

## 2022-01-10 DIAGNOSIS — M79604 Pain in right leg: Secondary | ICD-10-CM | POA: Insufficient documentation

## 2022-01-10 DIAGNOSIS — R2681 Unsteadiness on feet: Secondary | ICD-10-CM | POA: Diagnosis not present

## 2022-01-10 DIAGNOSIS — R2689 Other abnormalities of gait and mobility: Secondary | ICD-10-CM | POA: Insufficient documentation

## 2022-01-10 DIAGNOSIS — R262 Difficulty in walking, not elsewhere classified: Secondary | ICD-10-CM | POA: Diagnosis not present

## 2022-01-10 NOTE — Therapy (Signed)
?OUTPATIENT PHYSICAL THERAPY NEURO EVALUATION ? ? ?Patient Name: Tiffany Brady ?MRN: [id number] ?DOB:1961/07/31, 61 y.o., female ?Today's Date: 01/10/2022 ? ?PCP: Ladell Pier, MD ?REFERRING PROVIDER: [name], DO ? ? PT End of Session - 01/10/22 1107   ? ? Visit Number 1   ? Number of Visits 13   ? Date for PT Re-Evaluation 03/04/22   ? Authorization Type U[employer] Medicare/MCD Secondary   ? Progress Note Due on Visit 10   ? PT Start Time 1103   ? PT Stop Time 1145   ? PT Time Calculation (min) 42 min   ? Activity Tolerance Patient tolerated treatment well   ? Behavior During Therapy [facility] for tasks assessed/performed   ? ?  ?  ? ?  ? ? ?Past Medical History:  ?Diagnosis Date  ? Chronic kidney disease   ? pt reported kidney failure  ? Chronic pain syndrome   ? COVID-19   ? Gout   ? Hypertension   ? MS (multiple sclerosis) ([place])   ? Tobacco use   ? ?Past Surgical History:  ?Procedure Laterality Date  ? FOOT SURGERY Right   ? LEFT HEART CATH AND CORONARY ANGIOGRAPHY N/A 08/22/2018  ? Procedure: LEFT HEART CATH AND CORONARY ANGIOGRAPHY;  Surgeon: Sherren Mocha, MD;  Location: [facility];  Service: Cardiovascular;  Laterality: N/A;  ? ?Patient Active Problem List  ? Diagnosis Date Noted  ? Peripheral neuropathy 04/24/2020  ? COVID-19 04/09/2020  ? Postmenopausal bleeding 08/12/2019  ? Alopecia 03/29/2019  ? Subcutaneous cyst 03/29/2019  ? Snoring 12/10/2018  ? NSTEMI (non-ST elevated myocardial infarction) ([place]) 08/21/2018  ? De Quervain's disease (tenosynovitis) 08/02/2018  ? Body mass index 40.0-44.9, adult ([place]) 08/02/2018  ? Asymmetrical hearing loss of both ears 01/03/2018  ? Stress incontinence 05/08/2017  ? Cervical radiculopathy 05/08/2017  ? CKD (chronic kidney disease) stage 3, GFR 30-59 ml/min (HCC) 04/04/2017  ? Vitamin D deficiency 04/04/2017  ? Diabetes mellitus type II, controlled ([place]) 04/04/2017  ? Fibromyalgia 03/28/2016  ? Multiple sclerosis ([place]) 11/02/2015  ? Tobacco abuse 04/24/2014   ? Back muscle spasm 01/28/2014  ? Migraine headache 07/23/2013  ? Essential hypertension 06/27/2013  ? Chronic pain syndrome 06/27/2013  ? Morbid obesity ([place]) 06/27/2013  ? ? ?ONSET DATE: 12/22/21 (Referral Date) ? ?REFERRING DIAG: R26.81 (ICD-10-CM) - Gait instability ?M79.604,M79.605 (ICD-10-CM) - Leg pain, bilateral ? ?THERAPY DIAG:  ?Difficulty in walking, not elsewhere classified ? ?Unsteadiness on feet ? ?Muscle weakness (generalized) ? ?Other abnormalities of gait and mobility ? ?SUBJECTIVE:  ?                                                                                                                                                                                           ? ?  SUBJECTIVE STATEMENT: ?Patient reports that her legs get weak and wobbly on her. Patient reports that the legs can feel very heavy, typically the left over the right. Patient is using a SBQC outside in the community, does not use it in the house. Reports she does use a Rollator when ambulating to grocery store so if she needs to sit. Reports she also has pain in the lower legs as well, fluctuates. Patient reports the balance feels off, reports she has noticed she veers to the right side. No falls to report.  ?Pt accompanied by: self ? ?PERTINENT HISTORY:  CKD, Chronic Pain, COVID, Gout, HTN, MS ? ?PAIN:  ?Are you having pain? Yes: NPRS scale: 6/10 ?Pain location: Bilat Legs ?Pain description: Tightness ?Aggravating factors: Stretching ?Relieving factors: Rest ? ?PRECAUTIONS: Fall ? ?WEIGHT BEARING RESTRICTIONS No ? ?FALLS: Has patient fallen in last 6 months? No ? ?LIVING ENVIRONMENT: ?Lives with: lives alone ?Lives in: House/apartment ?Stairs: No; on 1st Floor but has Elevator, does not have to take stairs ?Has following equipment at home: Quad cane small base and Walker - 4 wheeled ? ?PLOF: Independent with household mobility with device and Independent with community mobility with device ? ?PATIENT GOALS: Patient wants to be more  stable with walking; Improve the balance, Want to Run ? ?OBJECTIVE:  ? ?DIAGNOSTIC FINDINGS:  MRI lumbar spine from 2022 shows degenerative changes worse at L4-5 and L5-S1 with biformainal stenosis and endplate spurring ? ?COGNITION: ?Overall cognitive status: Within functional limits for tasks assessed ?  ?SENSATION: ?WFL upon testing; reports some numbness in the antero lateral thigh on the RLE.  ? ?COORDINATION: ?WNL for BLEs ? ?EDEMA:  ?None noted in BLE ? ?POSTURE: rounded shoulders and forward head ? ? ?MMT:   ? ?MMT Right ?01/10/2022 Left ?01/10/2022  ?Hip flexion 4/5 3+/5  ?Hip extension    ?Hip abduction 4-/5 4-/5  ?Hip adduction 4/5 4/5  ?Hip internal rotation    ?Hip external rotation    ?Knee flexion 4+/5 4-/5  ?Knee extension 4+/5 4-/5 (reporting 9/10 pain in thigh)  ?Ankle dorsiflexion 4+/5 4+/5  ?Ankle plantarflexion    ?Ankle inversion    ?Ankle eversion    ?(Blank rows = not tested) ? ? ?TRANSFERS: ?Assistive device utilized: None  ?Sit to stand: SBA ?Stand to sit: SBA ? ?STAIRS: ? Level of Assistance: SBA ? Stair Negotiation Technique: Alternating Pattern  with Single Rail on Right ?Bilateral Rails ? Number of Stairs: 8  ? Height of Stairs: 6  ?Comments: mild unsteadiness noted with single rail; unable to complete without rails. Pt reports feeling uncomfortable with descent. Does not have stairs to complete daily but family member houses does have stairs she has to complete.  ? ?GAIT: ?Gait pattern: step through pattern, decreased step length- Right, and decreased step length- Left ?Distance walked: 115 ?Assistive device utilized: Quad cane small base and None ?Level of assistance: SBA ?Comments: mild unsteadiness without AD; not properly sequencing SBQC. PT adjusted height for patient of SBQC.  ?Gait Speed: 16.62 secs with AD = 1.97 ft/sec ? ?FUNCTIONAL TESTs:  ?5 times sit to stand: 15.69 secs without UE support; some tightness in the L thigh. ?Timed up and go (TUG): 14.28 secs w/ AD; 12.66 secs w/o  AD. Mild unsteadiness without AD ?6 minute walk test: 815 ft with use of SBQC, 2-3 instances of stumbles/veering noted. Vitals prior: HR: 78, Sp02; 96%. Vitals after: HR: 92, Sp02: 93%. Sp02 improved with seated pursed lip breathing.  ? ?PATIENT SURVEYS:  ?  N/A ? ? ?PATIENT EDUCATION: ?Education details: Educated on POC/Eval Findings ?Person educated: Patient ?Education method: Explanation ?Education comprehension: verbalized understanding ? ? ?HOME EXERCISE PROGRAM: ?To Be Established at Next Visit ? ? ? ?GOALS: ?Goals reviewed with patient? Yes ? ?SHORT TERM GOALS: Target date: 02/04/2022 ? ?Pt will be independent with initial HEP for improved strength and balance  ?Baseline: no HEP established ?Goal status: INITIAL ? ?2. FGA TBA and STG/LTG to be Updated ?Baseline: TBA ?Goal status: INITIAL ? ?3.  Pt will improve gait speed to >/= 2.3 ft/sec to demonstrate improved community ambulation ?Baseline: 1.97 ft/sec ?Goal status: INITIAL ? ?4. Pt will be able to complete TUG without AD in </= 11 seconds ?Baseline: 12.66 secs ?Goal status: INITIAL ? ? ? ?LONG TERM GOALS: Target date: 03/04/2022 ? ?Pt will be independent with final HEP for improved strengthening, and daily walking program >/= 20 minutes  ?Baseline: no HEP established ?Goal status: INITIAL ? ?2.  LTG to be set for FGA as applicable ?Baseline: TBA ?Goal status: INITIAL ? ?3.  Pt will improve 5x STS to </= 12 sec to demo improved functional LE strength and balance  ?Baseline: 15.69 secs ?Goal status: INITIAL ? ?4.  Pt will improve gait speed to >/= 2.62 ft/sec to demonstrate improved community ambulation ?Baseline: 1.97 ft/sec ?Goal status: INITIAL ? ?5.  Pt will able to ambulate >/= 1100 ft with 6MWT demonstrating improved endurance/activity tolerance ?Baseline: 815 ft with SBQC ?Goal status: INITIAL ? ? ?ASSESSMENT: ? ?CLINICAL IMPRESSION: ?Patient is a 61 y.o. female referred to Neuro OPPT services for MS/Gait Abnormality. Patient's PMH significant for the  following: CKD, Chronic Pain, COVID, Gout, HTN, MS . Upon evaluation, patient presents with the following impairments: pain, decreased strength, abnormal gait, decreased balance, decreased endurance/ac

## 2022-01-13 ENCOUNTER — Ambulatory Visit: Payer: Medicare Other | Admitting: Physical Therapy

## 2022-01-18 DIAGNOSIS — Z5181 Encounter for therapeutic drug level monitoring: Secondary | ICD-10-CM | POA: Diagnosis not present

## 2022-01-19 ENCOUNTER — Other Ambulatory Visit: Payer: Self-pay | Admitting: Internal Medicine

## 2022-01-19 ENCOUNTER — Other Ambulatory Visit: Payer: Self-pay | Admitting: Physician Assistant

## 2022-01-19 DIAGNOSIS — G35 Multiple sclerosis: Secondary | ICD-10-CM

## 2022-01-19 DIAGNOSIS — Z5181 Encounter for therapeutic drug level monitoring: Secondary | ICD-10-CM | POA: Diagnosis not present

## 2022-01-21 ENCOUNTER — Ambulatory Visit: Payer: Medicare Other | Admitting: Physical Therapy

## 2022-01-25 ENCOUNTER — Ambulatory Visit: Payer: Medicare Other | Admitting: Critical Care Medicine

## 2022-01-25 ENCOUNTER — Ambulatory Visit: Payer: Medicare Other | Admitting: Physical Therapy

## 2022-01-27 ENCOUNTER — Ambulatory Visit: Payer: Medicare Other | Admitting: Physical Therapy

## 2022-02-01 ENCOUNTER — Ambulatory Visit: Payer: Medicare Other | Admitting: Physical Therapy

## 2022-02-01 DIAGNOSIS — Z5181 Encounter for therapeutic drug level monitoring: Secondary | ICD-10-CM | POA: Diagnosis not present

## 2022-02-02 DIAGNOSIS — Z5181 Encounter for therapeutic drug level monitoring: Secondary | ICD-10-CM | POA: Diagnosis not present

## 2022-02-03 ENCOUNTER — Ambulatory Visit: Payer: Medicare Other

## 2022-02-09 ENCOUNTER — Ambulatory Visit: Payer: Medicare Other | Admitting: Physical Therapy

## 2022-02-11 ENCOUNTER — Ambulatory Visit: Payer: Medicare Other | Attending: Internal Medicine | Admitting: Internal Medicine

## 2022-02-11 ENCOUNTER — Encounter: Payer: Self-pay | Admitting: Internal Medicine

## 2022-02-11 ENCOUNTER — Ambulatory Visit: Payer: Medicare Other

## 2022-02-11 ENCOUNTER — Telehealth: Payer: Self-pay | Admitting: Neurology

## 2022-02-11 ENCOUNTER — Emergency Department (HOSPITAL_COMMUNITY): Admission: EM | Admit: 2022-02-11 | Payer: Medicare Other | Source: Home / Self Care

## 2022-02-11 DIAGNOSIS — Z720 Tobacco use: Secondary | ICD-10-CM | POA: Diagnosis not present

## 2022-02-11 DIAGNOSIS — N1831 Chronic kidney disease, stage 3a: Secondary | ICD-10-CM

## 2022-02-11 DIAGNOSIS — I152 Hypertension secondary to endocrine disorders: Secondary | ICD-10-CM

## 2022-02-11 DIAGNOSIS — M797 Fibromyalgia: Secondary | ICD-10-CM | POA: Diagnosis not present

## 2022-02-11 DIAGNOSIS — I2510 Atherosclerotic heart disease of native coronary artery without angina pectoris: Secondary | ICD-10-CM | POA: Diagnosis not present

## 2022-02-11 DIAGNOSIS — R0683 Snoring: Secondary | ICD-10-CM | POA: Diagnosis not present

## 2022-02-11 DIAGNOSIS — E1159 Type 2 diabetes mellitus with other circulatory complications: Secondary | ICD-10-CM

## 2022-02-11 DIAGNOSIS — G4709 Other insomnia: Secondary | ICD-10-CM

## 2022-02-11 DIAGNOSIS — G35 Multiple sclerosis: Secondary | ICD-10-CM

## 2022-02-11 DIAGNOSIS — L989 Disorder of the skin and subcutaneous tissue, unspecified: Secondary | ICD-10-CM

## 2022-02-11 DIAGNOSIS — D751 Secondary polycythemia: Secondary | ICD-10-CM

## 2022-02-11 DIAGNOSIS — E1169 Type 2 diabetes mellitus with other specified complication: Secondary | ICD-10-CM

## 2022-02-11 DIAGNOSIS — Z1231 Encounter for screening mammogram for malignant neoplasm of breast: Secondary | ICD-10-CM

## 2022-02-11 DIAGNOSIS — R2681 Unsteadiness on feet: Secondary | ICD-10-CM

## 2022-02-11 MED ORDER — HYDROXYZINE PAMOATE 25 MG PO CAPS: 25.0000 mg | ORAL_CAPSULE | Freq: Every day | ORAL | 3 refills | Status: DC

## 2022-02-11 NOTE — Progress Notes (Unsigned)
C/o pain in left leg - missed two appointments with therapy  Index finger on left hand keeps awake at night Bump on left  side of head

## 2022-02-11 NOTE — Patient Instructions (Signed)
Take the Lyrica twice a day as prescribed. I have referred you to the ophthalmologist, dermatologist, and pain specialist.

## 2022-02-11 NOTE — Progress Notes (Unsigned)
Patient ID: Tiffany Brady, female    DOB: 01-23-1961  MRN: [id number]  CC: Leg Pain (/) and Hand Pain   Subjective: Tiffany Brady is a 61 y.o. female who presents for chronic ds management Her concerns today include:  Pt with hx of DM type 2, HTN, Tob dep, CAD, obesity, fibromyalgia, CKD stage 3 and multiple sclerosis, COVID pneumonia 03/2020.   C/o bump on LT side of head x 1 yr.  Not sure if any increase size.  Does not hurt  Pain in legs, arm, hands and RT index finger.  RT index finger has been bothering her for 6 mths Dr. Posey Pronto put her in therapy.  Missed 2 appts, out of town.  Plans to reschedule  Loud snoring: referred for sleep study.  Missed appt because her cab driver left her.  DM/obesity:  reports compliance with Farxiga Down 13 lbs since 12-05-2021.  Reports decease appetite and cut back on sweets.  A1C 6.3  HTN/CAD:  Reports compliance with meds but sometimes forgot to take Lipitor at nights No CP but sometimes feels pressure when she lay on LT side  Tob dep: has cut down over the past 2 wks.  Smokes 2 cigars a day.   CKD 3  HM:  due for MMG, had 1st shingles vaccine at [facility] on [address].  Patient Active Problem List   Diagnosis Date Noted   Peripheral neuropathy 04/24/2020   COVID-19 04/09/2020   Postmenopausal bleeding 08/12/2019   Alopecia 03/29/2019   Subcutaneous cyst 03/29/2019   Snoring 12/10/2018   NSTEMI (non-ST elevated myocardial infarction) ([place]) 08/21/2018   De Quervain's disease (tenosynovitis) 08/02/2018   Body mass index 40.0-44.9, adult ([place]) 08/02/2018   Asymmetrical hearing loss of both ears 01/03/2018   Stress incontinence 05/08/2017   Cervical radiculopathy 05/08/2017   CKD (chronic kidney disease) stage 3, GFR 30-59 ml/min ([place]) 04/04/2017   Vitamin D deficiency 04/04/2017   Diabetes mellitus type II, controlled ([place]) 04/04/2017   Fibromyalgia 03/28/2016   Multiple sclerosis ([place]) 11/02/2015   Tobacco abuse 04/24/2014    Back muscle spasm 01/28/2014   Migraine headache 07/23/2013   Essential hypertension 06/27/2013   Chronic pain syndrome 06/27/2013   Morbid obesity ([place]) 06/27/2013     Current Outpatient Medications on File Prior to Visit  Medication Sig Dispense Refill   Accu-Chek Softclix Lancets lancets AS DIRECTED DAILY 100 each 11   acyclovir (ZOVIRAX) 800 MG tablet Take 1 tablet (800 mg total) by mouth 5 (five) times daily. 35 tablet 0   AMBULATORY NON FORMULARY MEDICATION Upright walker 1 Product 0   AMBULATORY NON FORMULARY MEDICATION 4-wheeled rollator 1 each 0   amLODipine (NORVASC) 5 MG tablet Take 1 tablet (5 mg total) by mouth daily. 90 tablet 3   aspirin EC 81 MG tablet Take 1 tablet (81 mg total) by mouth daily. 100 tablet 1   atorvastatin (LIPITOR) 80 MG tablet TAKE ONE TABLET BY MOUTH DAILY AT 6PM. *MAKE APPT FOR FURTHER REFILLS* 15 tablet 0   cholecalciferol (VITAMIN D3) 25 MCG (1000 UT) tablet Take 5,000 Units by mouth daily.     clopidogrel (PLAVIX) 75 MG tablet TAKE ONE TABLET BY MOUTH DAILY 15 tablet 0   cyclobenzaprine (FLEXERIL) 10 MG tablet TAKE ONE TABLET BY MOUTH DAILY AS NEEDED FOR MUSCLE SPASMS 30 tablet 0   diclofenac Sodium (VOLTAREN) 1 % GEL Apply 2 g topically 4 (four) times daily. 100 g 2   diclofenac Sodium (VOLTAREN) 1 % GEL  APPLY TWO GRAMS TOPICALLY FOUR TIMES A DAY 100 g 0   Dimethyl Fumarate 240 MG CPDR Take 1 capsule (240 mg total) by mouth in the morning and at bedtime. 180 capsule 3   DULoxetine (CYMBALTA) 30 MG capsule TAKE ONE CAPSULE BY MOUTH DAILY 30 capsule 5   ezetimibe (ZETIA) 10 MG tablet TAKE ONE TABLET BY MOUTH DAILY 90 tablet 3   FARXIGA 5 MG TABS tablet TAKE ONE TABLET BY MOUTH DAILY BEFORE BREAKFAST 90 tablet 0   fluconazole (DIFLUCAN) 150 MG tablet Take 1 tablet (150 mg total) by mouth daily. 1 tablet 0   fluticasone (FLONASE) 50 MCG/ACT nasal spray PLACE ONE SPRAY INTO BOTH NOSTRILS DAILY AS NEEDED FOR ALLERGIES OR RHINITIS (Patient taking  differently: Place 1 spray into both nostrils daily as needed for allergies.) 16 g 0   losartan (COZAAR) 25 MG tablet Take 1 tablet (25 mg total) by mouth daily. Must have office visit for refills 30 tablet 0   metoprolol tartrate (LOPRESSOR) 50 MG tablet Take 50 mg by mouth 2 (two) times daily.     nitroGLYCERIN (NITROSTAT) 0.4 MG SL tablet Place 1 tablet (0.4 mg total) under the tongue every 5 (five) minutes x 3 doses as needed for chest pain. 25 tablet 12   NON FORMULARY Place into the nose continuous. 3 L of O2 in the pm     pregabalin (LYRICA) 75 MG capsule TAKE ONE CAPSULE BY MOUTH TWICE A DAY 60 capsule 4   No current facility-administered medications on file prior to visit.    Allergies  Allergen Reactions   Pork-Derived Products Hives    Social History   Socioeconomic History   Marital status: Widowed    Spouse name: Not on file   Number of children: Not on file   Years of education: Not on file   Highest education level: Not on file  Occupational History   Not on file  Tobacco Use   Smoking status: Every Day    Packs/day: 0.75    Years: 40.00    Pack years: 30.00    Types: Cigarettes   Smokeless tobacco: Never  Vaping Use   Vaping Use: Never used  Substance and Sexual Activity   Alcohol use: Yes    Comment: occasional   Drug use: No   Sexual activity: Yes    Birth control/protection: Post-menopausal    Comment: 1st intercourse 61 yo-More than 5 partners  Other Topics Concern   Not on file  Social History Narrative   Lives with niece and her 3 children in a 2 story home.     Only goes upstairs once a day.  Has 1 child.  Does not work.  Trying to get disability.  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     Education: 10th grade.   Right Handed   Social Determinants of Health   Financial Resource Strain: Not on file  Food Insecurity: Not on file  Transportation Needs: Not on file  Physical Activity: Not on file  Stress: Not on file  Social  Connections: Not on file  Intimate Partner Violence: Not on file    Family History  Problem Relation Age of Onset   Cancer Mother        Deceased-Stomach   Hypertension Mother    Other Father        Deceased, 62   HIV/AIDS Brother        Deceased   Multiple sclerosis Daughter  Breast cancer Neg Hx     Past Surgical History:  Procedure Laterality Date   FOOT SURGERY Right    LEFT HEART CATH AND CORONARY ANGIOGRAPHY N/A 08/22/2018   Procedure: LEFT HEART CATH AND CORONARY ANGIOGRAPHY;  Surgeon: Sherren Mocha, MD;  Location: [facility];  Service: Cardiovascular;  Laterality: N/A;    ROS: Review of Systems Negative except as stated above  PHYSICAL EXAM: BP 122/74 (BP Location: Left Arm, Patient Position: Sitting, Cuff Size: Large)   Pulse 88   Temp 98.4 F (36.9 C) (Oral)   Resp 20   Wt 249 lb (112.9 kg)   LMP 04/21/2014   SpO2 94%   BMI 42.74 kg/m   Wt Readings from Last 3 Encounters:  02/11/22 249 lb (112.9 kg)  12/01/21 262 lb (118.8 kg)  06/03/21 256 lb (116.1 kg)    Physical Exam  {female adult master:310786} {female adult master:310785}     Latest Ref Rng & Units 05/18/2021    2:43 PM 12/29/2020    2:58 PM 11/23/2020    9:05 AM  CMP  Glucose 65 - 99 mg/dL 79   108   94    BUN 8 - 27 mg/dL '10   11   13    '$ Creatinine 0.57 - 1.00 mg/dL 1.49   1.40   1.25    Sodium 134 - 144 mmol/L 140   143   138    Potassium 3.5 - 5.2 mmol/L 4.1   4.4   4.3    Chloride 96 - 106 mmol/L 102   102   102    CO2 20 - 29 mmol/L '27   23   21    '$ Calcium 8.7 - 10.3 mg/dL 9.4   10.2   9.2    Total Protein 6.0 - 8.5 g/dL 7.0    7.0    Total Bilirubin 0.0 - 1.2 mg/dL 0.7    0.5    Alkaline Phos 44 - 121 IU/L 93    92    AST 0 - 40 IU/L 13    16    ALT 0 - 32 IU/L 12    12     Lipid Panel     Component Value Date/Time   CHOL 157 11/23/2020 0905   TRIG 68 11/23/2020 0905   HDL 53 11/23/2020 0905   CHOLHDL 3.0 11/23/2020 0905   CHOLHDL 3.1 08/21/2018 1442   VLDL 6  08/21/2018 1442   LDLCALC 91 11/23/2020 0905    CBC    Component Value Date/Time   WBC 7.3 05/18/2021 1443   WBC 5.2 04/12/2020 2146   RBC 5.52 (H) 05/18/2021 1443   RBC 5.60 (H) 04/12/2020 2146   HGB 16.6 (H) 05/18/2021 1443   HCT 50.9 (H) 05/18/2021 1443   PLT 204 05/18/2021 1443   MCV 92 05/18/2021 1443   MCH 30.1 05/18/2021 1443   MCH 28.9 04/12/2020 2146   MCHC 32.6 05/18/2021 1443   MCHC 32.1 04/12/2020 2146   RDW 13.5 05/18/2021 1443   LYMPHSABS 0.7 04/09/2020 0850   MONOABS 0.2 04/09/2020 0850   EOSABS 0.0 04/09/2020 0850   BASOSABS 0.0 04/09/2020 0850    ASSESSMENT AND PLAN:  There are no diagnoses linked to this encounter.   Patient was given the opportunity to ask questions.  Patient verbalized understanding of the plan and was able to repeat key elements of the plan.   This documentation was completed using Radio producer.  Any  transcriptional errors are unintentional.  No orders of the defined types were placed in this encounter.    Requested Prescriptions    No prescriptions requested or ordered in this encounter    No follow-ups on file.  Karle Plumber, MD, FACP

## 2022-02-11 NOTE — Telephone Encounter (Signed)
Pt came in wanting to see about getting a physical therapy referral. She says she had one a while back, but needs a new one.

## 2022-02-12 LAB — COMPREHENSIVE METABOLIC PANEL
ALT: 12 IU/L (ref 0–32)
AST: 16 IU/L (ref 0–40)
Albumin/Globulin Ratio: 1.4 (ref 1.2–2.2)
Albumin: 4.2 g/dL (ref 3.8–4.8)
Alkaline Phosphatase: 105 IU/L (ref 44–121)
BUN/Creatinine Ratio: 10 — ABNORMAL LOW (ref 12–28)
BUN: 15 mg/dL (ref 8–27)
Bilirubin Total: 1 mg/dL (ref 0.0–1.2)
CO2: 20 mmol/L (ref 20–29)
Calcium: 9.7 mg/dL (ref 8.7–10.3)
Chloride: 100 mmol/L (ref 96–106)
Creatinine, Ser: 1.48 mg/dL — ABNORMAL HIGH (ref 0.57–1.00)
Globulin, Total: 3 g/dL (ref 1.5–4.5)
Glucose: 105 mg/dL — ABNORMAL HIGH (ref 70–99)
Potassium: 3.9 mmol/L (ref 3.5–5.2)
Sodium: 140 mmol/L (ref 134–144)
Total Protein: 7.2 g/dL (ref 6.0–8.5)
eGFR: 40 mL/min/{1.73_m2} — ABNORMAL LOW (ref >59)

## 2022-02-12 LAB — CBC
Hematocrit: 49.8 % — ABNORMAL HIGH (ref 34.0–46.6)
Hemoglobin: 17 g/dL — ABNORMAL HIGH (ref 11.1–15.9)
MCH: 30.8 pg (ref 26.6–33.0)
MCHC: 34.1 g/dL (ref 31.5–35.7)
MCV: 90 fL (ref 79–97)
Platelets: 224 10*3/uL (ref 150–450)
RBC: 5.52 x10E6/uL — ABNORMAL HIGH (ref 3.77–5.28)
RDW: 12.8 % (ref 11.7–15.4)
WBC: 9.8 10*3/uL (ref 3.4–10.8)

## 2022-02-12 LAB — LIPID PANEL
Chol/HDL Ratio: 2.2 ratio (ref 0.0–4.4)
Cholesterol, Total: 145 mg/dL (ref 100–199)
HDL: 65 mg/dL (ref >39)
LDL Chol Calc (NIH): 68 mg/dL (ref 0–99)
Triglycerides: 56 mg/dL (ref 0–149)
VLDL Cholesterol Cal: 12 mg/dL (ref 5–40)

## 2022-02-12 LAB — POCT GLYCOSYLATED HEMOGLOBIN (HGB A1C): HbA1c, POC (prediabetic range): 6.3 % (ref 5.7–6.4)

## 2022-02-12 NOTE — Addendum Note (Signed)
Addended by: Carilyn Goodpasture on: 02/12/2022 12:49 PM   Modules accepted: Orders

## 2022-02-14 NOTE — Telephone Encounter (Signed)
Order placed in Epic for PT for gait instability

## 2022-02-15 ENCOUNTER — Other Ambulatory Visit: Payer: Self-pay | Admitting: Nurse Practitioner

## 2022-02-15 ENCOUNTER — Other Ambulatory Visit: Payer: Self-pay | Admitting: Internal Medicine

## 2022-02-15 ENCOUNTER — Other Ambulatory Visit: Payer: Self-pay | Admitting: Physician Assistant

## 2022-02-15 ENCOUNTER — Ambulatory Visit: Payer: Medicare Other | Admitting: Physical Therapy

## 2022-02-15 ENCOUNTER — Other Ambulatory Visit: Payer: Self-pay | Admitting: Interventional Cardiology

## 2022-02-15 DIAGNOSIS — G35 Multiple sclerosis: Secondary | ICD-10-CM

## 2022-02-15 DIAGNOSIS — Z5181 Encounter for therapeutic drug level monitoring: Secondary | ICD-10-CM | POA: Diagnosis not present

## 2022-02-17 ENCOUNTER — Ambulatory Visit: Payer: Medicare Other

## 2022-02-18 ENCOUNTER — Ambulatory Visit: Payer: Medicare Other | Attending: Neurology

## 2022-02-21 DIAGNOSIS — Z5181 Encounter for therapeutic drug level monitoring: Secondary | ICD-10-CM | POA: Diagnosis not present

## 2022-02-22 ENCOUNTER — Ambulatory Visit: Payer: Medicare Other | Admitting: Physical Therapy

## 2022-02-23 ENCOUNTER — Ambulatory Visit: Payer: Medicare Other

## 2022-02-24 ENCOUNTER — Ambulatory Visit: Payer: Medicare Other | Admitting: Physical Therapy

## 2022-03-01 ENCOUNTER — Ambulatory Visit: Payer: Medicare Other

## 2022-03-01 DIAGNOSIS — Z5181 Encounter for therapeutic drug level monitoring: Secondary | ICD-10-CM | POA: Diagnosis not present

## 2022-03-02 ENCOUNTER — Ambulatory Visit: Payer: Medicare Other

## 2022-03-03 ENCOUNTER — Ambulatory Visit: Payer: Medicare Other | Admitting: Physical Therapy

## 2022-03-08 ENCOUNTER — Ambulatory Visit: Payer: Medicare Other | Admitting: Internal Medicine

## 2022-03-16 ENCOUNTER — Other Ambulatory Visit: Payer: Self-pay | Admitting: Internal Medicine

## 2022-03-16 DIAGNOSIS — G6289 Other specified polyneuropathies: Secondary | ICD-10-CM

## 2022-03-17 ENCOUNTER — Ambulatory Visit (HOSPITAL_BASED_OUTPATIENT_CLINIC_OR_DEPARTMENT_OTHER): Payer: Medicare Other | Attending: Internal Medicine | Admitting: Internal Medicine

## 2022-03-17 ENCOUNTER — Other Ambulatory Visit: Payer: Self-pay

## 2022-03-17 DIAGNOSIS — G4761 Periodic limb movement disorder: Secondary | ICD-10-CM | POA: Insufficient documentation

## 2022-03-17 DIAGNOSIS — R0683 Snoring: Secondary | ICD-10-CM | POA: Insufficient documentation

## 2022-03-17 DIAGNOSIS — G4733 Obstructive sleep apnea (adult) (pediatric): Secondary | ICD-10-CM | POA: Insufficient documentation

## 2022-03-20 DIAGNOSIS — R0683 Snoring: Secondary | ICD-10-CM

## 2022-03-20 NOTE — Procedures (Signed)
     Patient Name: Tiffany Brady, Ow Date: 03/17/2022 Gender: Female D.O.B: 1960/12/11 Age (years): 65 Referring Provider: Karle Plumber MD Height (inches): 64 Interpreting Physician: Baird Lyons MD, ABSM Weight (lbs): 250 RPSGT: Laren Everts BMI: 45 MRN: [id number] Neck Size: 15.00  CLINICAL INFORMATION Sleep Study Type: NPSG Indication for sleep study: Diabetes, Hypertension, Morbid Obesity, Morning Headaches, Parasomnias, Restless Sleep with Limb Movments, Snoring, Witnesses Apnea / Gasping During Sleep Epworth Sleepiness Score: 3  SLEEP STUDY TECHNIQUE As per the AASM Manual for the Scoring of Sleep and Associated Events v2.3 (April 2016) with a hypopnea requiring 4% desaturations.  The channels recorded and monitored were frontal, central and occipital EEG, electrooculogram (EOG), submentalis EMG (chin), nasal and oral airflow, thoracic and abdominal wall motion, anterior tibialis EMG, snore microphone, electrocardiogram, and pulse oximetry.  MEDICATIONS Medications self-administered by patient taken the night of the study : PREGABALIN, DULOXETINE, HYDROXYZINE, Dimethyl Fumarate  SLEEP ARCHITECTURE The study was initiated at 10:42:06 PM and ended at 5:00:02 AM.  Sleep onset time was 12.4 minutes and the sleep efficiency was 41.8%%. The total sleep time was 158 minutes.  Stage REM latency was 171.5 minutes.  The patient spent 31.0%% of the night in stage N1 sleep, 49.7%% in stage N2 sleep, 0.3%% in stage N3 and 19% in REM.  Alpha intrusion was absent.  Supine sleep was 31.84%.  RESPIRATORY PARAMETERS The overall apnea/hypopnea index (AHI) was 11.4 per hour. There were 0 total apneas, including 0 obstructive, 0 central and 0 mixed apneas. There were 30 hypopneas and 29 RERAs.  The AHI during Stage REM sleep was 42.0 per hour.  AHI while supine was 11.9 per hour.  The mean oxygen saturation was 92.7%. The minimum SpO2 during sleep was 84.0%.  moderate  snoring was noted during this study.  CARDIAC DATA The 2 lead EKG demonstrated sinus rhythm. The mean heart rate was 72.4 beats per minute. Other EKG findings include: None.  LEG MOVEMENT DATA The total PLMS were 0 with a resulting PLMS index of 0.0. Associated arousal with leg movement index was 0.0 .  IMPRESSIONS - Mild obstructive sleep apnea occurred during this study (AHI = 11.4/h). - Mild oxygen desaturation was noted during this study (Min O2 = 84.0%). Mean 92.7% - The patient snored with moderate snoring volume. - No cardiac abnormalities were noted during this study. - Clinically significant periodic limb movements did not occur during sleep. No significant associated arousals. - Technician reported patient had to sit upright during study due to pain, 4 restroom breaks, eating snacks upon awakening and after bathroom breaks, frequent coughing throughout the study.  DIAGNOSIS - Obstructive Sleep Apnea (G47.33)  RECOMMENDATIONS - Suggest CPAP titration sleep study or autopap. Other options would be based on clinical judgment. - Be careful with alcohol, sedatives and other CNS depressants that may worsen sleep apnea and disrupt normal sleep architecture. - Sleep hygiene should be reviewed to assess factors that may improve sleep quality. - Weight management and regular exercise should be initiated or continued if appropriate.  [Electronically signed] 03/20/2022 12:32 PM  Baird Lyons MD, [place], American Board of Sleep Medicine NPI: [id number]                        [place], [facility]  ELECTRONICALLY SIGNED ON:  03/20/2022, 12:27 PM [place] PH: [phone]   FX: [phone] [place]

## 2022-03-21 ENCOUNTER — Telehealth: Payer: Self-pay | Admitting: Emergency Medicine

## 2022-03-21 NOTE — Telephone Encounter (Signed)
Copied from [place]. Topic: General - Other >> Mar 21, 2022  1:17 PM Rudene Anda wrote: Reason for CRM: Pt called in asking if it would be possible for Dr Wynetta Emery to write her a drs note excusing her from [place] duty, please advise.

## 2022-03-25 NOTE — Telephone Encounter (Signed)
Called patient to ask- Reason for excuse and date of jury duty.  Advised to leave information.

## 2022-03-28 NOTE — Telephone Encounter (Signed)
Pt has called back stating the jury duty is supposed to start 7/27. She feels she cannnot attend at all b/c she would be no use due to her memory and her legs bother her to walk far. Please fu to let her know when she can put up note as duty is nxt Thur 27th. Fu at [phone]

## 2022-03-29 DIAGNOSIS — H1013 Acute atopic conjunctivitis, bilateral: Secondary | ICD-10-CM | POA: Diagnosis not present

## 2022-03-29 DIAGNOSIS — H5213 Myopia, bilateral: Secondary | ICD-10-CM | POA: Diagnosis not present

## 2022-03-29 DIAGNOSIS — H2513 Age-related nuclear cataract, bilateral: Secondary | ICD-10-CM | POA: Diagnosis not present

## 2022-03-29 DIAGNOSIS — E119 Type 2 diabetes mellitus without complications: Secondary | ICD-10-CM | POA: Diagnosis not present

## 2022-03-29 LAB — HM DIABETES EYE EXAM

## 2022-04-01 NOTE — Telephone Encounter (Signed)
Pt calling to check on status of [place] letter / pt stated [employer] [place] duty starts on 7.27.23/ please advise

## 2022-04-05 NOTE — Telephone Encounter (Signed)
Pt called back wanting to know if her note to be excused from [employer] is ready.  She needs it by July 26th.  She is supposed to go for Duty on the 27th.  CB@  [phone]

## 2022-04-07 NOTE — Telephone Encounter (Signed)
Pt has been informed of letter being ready for pick up.

## 2022-04-14 ENCOUNTER — Ambulatory Visit: Payer: Medicare Other | Admitting: Physician Assistant

## 2022-05-13 ENCOUNTER — Other Ambulatory Visit: Payer: Self-pay | Admitting: Nurse Practitioner

## 2022-05-13 ENCOUNTER — Other Ambulatory Visit: Payer: Self-pay | Admitting: Internal Medicine

## 2022-05-13 DIAGNOSIS — M797 Fibromyalgia: Secondary | ICD-10-CM

## 2022-05-13 DIAGNOSIS — G35 Multiple sclerosis: Secondary | ICD-10-CM

## 2022-05-13 DIAGNOSIS — G4709 Other insomnia: Secondary | ICD-10-CM

## 2022-05-13 NOTE — Telephone Encounter (Signed)
Requested medication (s) are due for refill today - yes  Requested medication (s) are on the active medication list -yes  Future visit scheduled -yes  Last refill: 02/15/21 #30 2RF  Notes to clinic: non delegated Rx  Requested Prescriptions  Pending Prescriptions Disp Refills   cyclobenzaprine (FLEXERIL) 10 MG tablet [Pharmacy Med Name: CYCLOBENZAPRINE HYDR '10MG'$  TAB] 30 tablet 1    Sig: TAKE ONE TABLET BY MOUTH DAILY AS NEEDED FOR MUSCLE SPASMS     Not Delegated - Analgesics:  Muscle Relaxants Failed - 05/13/2022 11:19 AM      Failed - This refill cannot be delegated      Passed - Valid encounter within last 6 months    Recent Outpatient Visits           3 months ago Type 2 diabetes mellitus with morbid obesity ([place])   [place] Ladell Pier, MD   10 months ago Vaginal discharge   [place], Deborah B, MD   12 months ago Type 2 diabetes mellitus with morbid obesity [facility])   [place], Deborah B, MD   1 year ago Type 2 diabetes mellitus with morbid obesity ([place])   [place] Ladell Pier, MD   1 year ago SOB (shortness of breath)   [place], Deborah B, MD       Future Appointments             In 1 month Wynetta Emery, Dalbert Batman, MD [place]               Requested Prescriptions  Pending Prescriptions Disp Refills   cyclobenzaprine (FLEXERIL) 10 MG tablet [Pharmacy Med Name: CYCLOBENZAPRINE HYDR '10MG'$  TAB] 30 tablet 1    Sig: TAKE ONE TABLET BY MOUTH DAILY AS NEEDED FOR MUSCLE SPASMS     Not Delegated - Analgesics:  Muscle Relaxants Failed - 05/13/2022 11:19 AM      Failed - This refill cannot be delegated      Passed - Valid encounter within last 6 months    Recent Outpatient Visits           3 months ago Type 2 diabetes mellitus with morbid  obesity ([place])   [place] Ladell Pier, MD   10 months ago Vaginal discharge   [place], MD   12 months ago Type 2 diabetes mellitus with morbid obesity [facility])   [place], Deborah B, MD   1 year ago Type 2 diabetes mellitus with morbid obesity [facility])   [place], MD   1 year ago SOB (shortness of breath)   [place], MD       Future Appointments             In 1 month Wynetta Emery, Dalbert Batman, MD [place]

## 2022-05-17 ENCOUNTER — Encounter: Payer: Self-pay | Admitting: Licensed Clinical Social Worker

## 2022-05-17 ENCOUNTER — Ambulatory Visit: Payer: Self-pay | Admitting: Licensed Clinical Social Worker

## 2022-05-17 NOTE — Patient Outreach (Signed)
  Care Coordination   Initial Visit Note   05/17/2022 Name: Tiffany Brady MRN: [id number] DOB: Sep 25, 1960  Tiffany Brady is a 61 y.o. year old female who sees Ladell Pier, MD for primary care. I spoke with  [name] by phone today.  What matters to the patients health and wellness today?  Renewing MyChart password. Receiving medical tx while out of town    Goals Addressed             This Visit's Progress    COMPLETED: Care Coordination Activities-No follow up required       Care Coordination Interventions: Active listening / Reflection utilized  Emotional Support Provided LCSW provided information on care coordination services. Patient is not interested in services, currently noting she is out of town indefinitely LCSW discussed how pt can request virtual appt via [place] and assisted pt in renewing MyChart password Pt informed that she is due a flu shot. Pt was appreciative        SDOH assessments and interventions completed:  No     Care Coordination Interventions Activated:  Yes  Care Coordination Interventions:  Yes, provided   Follow up plan: No further intervention required.   Encounter Outcome:  Pt. Visit Completed   Christa See, MSW, [email or web address] Phone [phone] 11:52 AM

## 2022-05-17 NOTE — Patient Instructions (Signed)
Visit Information  Thank you for taking time to visit with me today. Please don't hesitate to contact me if I can be of assistance to you.   Following are the goals we discussed today:   Goals Addressed             This Visit's Progress    COMPLETED: Care Coordination Activities-No follow up required       Care Coordination Interventions: Active listening / Reflection utilized  Emotional Support Provided LCSW provided information on care coordination services. Patient is not interested in services, currently noting she is out of town indefinitely LCSW discussed how pt can request virtual appt via [place] and assisted pt in renewing MyChart password Pt informed that she is due a flu shot. Pt was appreciative         If you are experiencing a Mental Health or [place] or need someone to talk to, please call the Suicide and Crisis Lifeline: 988 call 911   Patient verbalizes understanding of instructions and care plan provided today and agrees to view in [place]. Active MyChart status and patient understanding of how to access instructions and care plan via MyChart confirmed with patient.     No further follow up required:    Tiffany Brady, MSW, [email or web address] Phone [phone] 11:53 AM

## 2022-05-18 ENCOUNTER — Encounter: Payer: Self-pay | Admitting: Internal Medicine

## 2022-05-18 NOTE — Progress Notes (Signed)
I received a consumer consent/rights information from [place].  In this document, patient gave right for disclosure of confidential information including assessments, psychological assessments, psychiatric evaluations, medication prescribed, PCP, service notes, service plans, discharge summary, substance abuse treatment, HIV/AIDS information and medical, social and developmental history.  Consent is granted through 03/03/2023.

## 2022-06-03 ENCOUNTER — Ambulatory Visit: Payer: Medicare Other | Admitting: Neurology

## 2022-06-06 ENCOUNTER — Other Ambulatory Visit: Payer: Self-pay | Admitting: Internal Medicine

## 2022-06-06 DIAGNOSIS — I152 Hypertension secondary to endocrine disorders: Secondary | ICD-10-CM

## 2022-06-07 NOTE — Telephone Encounter (Signed)
Requested Prescriptions  Pending Prescriptions Disp Refills  . amLODipine (NORVASC) 5 MG tablet [Pharmacy Med Name: AMLODIPINE BESYLATE '5MG'$  TAB] 90 tablet 2    Sig: TAKE ONE TABLET BY MOUTH DAILY     Cardiovascular: Calcium Channel Blockers 2 Passed - 06/06/2022  1:09 PM      Passed - Last BP in normal range    BP Readings from Last 1 Encounters:  02/11/22 122/74         Passed - Last Heart Rate in normal range    Pulse Readings from Last 1 Encounters:  02/11/22 88         Passed - Valid encounter within last 6 months    Recent Outpatient Visits          3 months ago Type 2 diabetes mellitus with morbid obesity ([place])   [place] Ladell Pier, MD   11 months ago Vaginal discharge   [place], Deborah B, MD   1 year ago Type 2 diabetes mellitus with morbid obesity [facility])   [place], Deborah B, MD   1 year ago Type 2 diabetes mellitus with morbid obesity [facility])   [place], MD   2 years ago SOB (shortness of breath)   [place], MD      Future Appointments            In 1 week Ladell Pier, MD [place]

## 2022-06-14 ENCOUNTER — Ambulatory Visit: Payer: Medicare Other | Attending: Internal Medicine | Admitting: Internal Medicine

## 2022-06-14 DIAGNOSIS — E1159 Type 2 diabetes mellitus with other circulatory complications: Secondary | ICD-10-CM

## 2022-06-14 DIAGNOSIS — G4733 Obstructive sleep apnea (adult) (pediatric): Secondary | ICD-10-CM | POA: Diagnosis not present

## 2022-06-14 DIAGNOSIS — I152 Hypertension secondary to endocrine disorders: Secondary | ICD-10-CM | POA: Diagnosis not present

## 2022-06-14 DIAGNOSIS — I2510 Atherosclerotic heart disease of native coronary artery without angina pectoris: Secondary | ICD-10-CM | POA: Diagnosis not present

## 2022-06-14 DIAGNOSIS — G894 Chronic pain syndrome: Secondary | ICD-10-CM | POA: Diagnosis not present

## 2022-06-14 DIAGNOSIS — Z1231 Encounter for screening mammogram for malignant neoplasm of breast: Secondary | ICD-10-CM

## 2022-06-14 NOTE — Progress Notes (Signed)
Virtual Visit via Telephone Note  I connected with Tiffany Brady on 06/14/2022 at 11:49 AM by telephone and verified that I am speaking with the correct person using two identifiers  Location: Patient: home Provider: office  Participants: Myself Patient   I discussed the limitations, risks, security and privacy concerns of performing an evaluation and management service by telephone and the availability of in person appointments. I also discussed with the patient that there may be a patient responsible charge related to this service. The patient expressed understanding and agreed to proceed.   History of Present Illness: Pt with hx of DM type 2, HTN, Tob dep, CAD, obesity, fibromyalgia, CKD stage 3 and multiple sclerosis, COVID pneumonia 03/2020.   Patient states she requested visit today to be a telehealth visit because she is currently in [place] and has been there for about the past 2 months helping out a relative.  Had sleep study done.  This revealed mild OSA with mild O2 desat.  C/o chronic pain: On last visit with me in June, she complains of pain in legs, arms and hands which she attributes to the to MS and fibromyalgia.  She felt the Lyrica was not working.  At that point she was on Lyrica 75 mg once a day but was supposed to be taking it twice a day.  She was advised to increase it to twice a day.  We also referred her to pain specialist.     Referred to [facility].  Had appt scheduled for June of this year but had to cancel because she was in [place]. Appt rescheduled for this Friday but she is not sure that she would be back in town at that time and will have to reschedule again.   Today she tells me the pain remains a major issue for her.  Not able to sleep well at night because of pain. Has appt with Dr. Posey Pronto next mth  HTN/CAD: Confirms taking her medications consistently including Zetia, Cozaar 25 mg daily, amlodipine 5 mg daily, atorvastatin 80 mg daily,  metoprolol 50 mg twice a day, aspirin and Plavix.  No chest pains. No device to check BP  Tob dep:  when in pain she smokes more.  But has been trying to cut down  HM:  Did not have MMG done.  Plans to reschedule when she gets back in town. Has appt with [facility] in [place] to get flu and shingles vaccine this wk    Outpatient Encounter Medications as of 06/14/2022  Medication Sig   Accu-Chek Softclix Lancets lancets AS DIRECTED DAILY   acyclovir (ZOVIRAX) 800 MG tablet Take 1 tablet (800 mg total) by mouth 5 (five) times daily.   AMBULATORY NON FORMULARY MEDICATION Upright walker   AMBULATORY NON FORMULARY MEDICATION 4-wheeled rollator   amLODipine (NORVASC) 5 MG tablet TAKE ONE TABLET BY MOUTH DAILY   aspirin EC 81 MG tablet Take 1 tablet (81 mg total) by mouth daily.   atorvastatin (LIPITOR) 80 MG tablet Take 1 tablet (80 mg total) by mouth daily. Please schedule an appt. With Dr. Tamala Julian to receive future refills. Thank you. 3rd, Final Attempt.   cholecalciferol (VITAMIN D3) 25 MCG (1000 UT) tablet Take 5,000 Units by mouth daily.   clopidogrel (PLAVIX) 75 MG tablet Take 1 tablet (75 mg total) by mouth daily. Please schedule an appt. With Dr. Tamala Julian in order to receive future refills. Thank You. 3rd, Final Attempt.   cyclobenzaprine (FLEXERIL) 10 MG tablet TAKE ONE TABLET  BY MOUTH DAILY AS NEEDED FOR MUSCLE SPASMS   dapagliflozin propanediol (FARXIGA) 5 MG TABS tablet TAKE ONE TABLET BY MOUTH DAILY BEFORE BREAKFAST   diclofenac Sodium (VOLTAREN) 1 % GEL Apply 2 g topically 4 (four) times daily.   diclofenac Sodium (VOLTAREN) 1 % GEL APPLY TWO GRAMS TOPICALLY FOUR TIMES A DAY   Dimethyl Fumarate 240 MG CPDR Take 1 capsule (240 mg total) by mouth in the morning and at bedtime.   DULoxetine (CYMBALTA) 30 MG capsule TAKE ONE CAPSULE BY MOUTH DAILY   ezetimibe (ZETIA) 10 MG tablet TAKE ONE TABLET BY MOUTH DAILY   fluconazole (DIFLUCAN) 150 MG tablet Take 1 tablet (150 mg total) by mouth daily.    fluticasone (FLONASE) 50 MCG/ACT nasal spray PLACE ONE SPRAY INTO BOTH NOSTRILS DAILY AS NEEDED FOR ALLERGIES OR RHINITIS (Patient taking differently: Place 1 spray into both nostrils daily as needed for allergies.)   hydrOXYzine (VISTARIL) 25 MG capsule TAKE ONE CAPSULE BY MOUTH AT BEDTIME   losartan (COZAAR) 25 MG tablet Take 1 tablet (25 mg total) by mouth daily. Must have office visit for refills   metoprolol tartrate (LOPRESSOR) 50 MG tablet Take 50 mg by mouth 2 (two) times daily.   nitroGLYCERIN (NITROSTAT) 0.4 MG SL tablet Place 1 tablet (0.4 mg total) under the tongue every 5 (five) minutes x 3 doses as needed for chest pain.   NON FORMULARY Place into the nose continuous. 3 L of O2 in the pm   pregabalin (LYRICA) 75 MG capsule TAKE ONE CAPSULE BY MOUTH TWICE A DAY   No facility-administered encounter medications on file as of 06/14/2022.      Observations/Objective: No direct observation done as this was a telephone visit.  Assessment and Plan: 1. Chronic pain syndrome Associated with fibromyalgia and MS. Advised patient to call [facility] and reschedule if she thinks she will not be in town on Friday of this week to attend her appointment.  I doubt they do video visits on first appointment.  2. OSA (obstructive sleep apnea) Given that she has polycythemia associated with her sleep apnea, I recommend that we do pursue titration study rather than trying conservative measures.  Patient is agreeable for me to submit the referral for her to have the titration study done.  3. Hypertension associated with diabetes ([place]) Continue current medications listed above.  4. Coronary artery disease involving native coronary artery of native heart without angina pectoris Stable on current medications including aspirin, Plavix, metoprolol and atorvastatin.  Strongly advised her to quit smoking.  5. Encounter for screening mammogram for malignant neoplasm of breast Advised to call  [facility] imaging breast center when she returns to reschedule her mammogram.   Follow Up Instructions: 3 mths   I discussed the assessment and treatment plan with the patient. The patient was provided an opportunity to ask questions and all were answered. The patient agreed with the plan and demonstrated an understanding of the instructions.   The patient was advised to call back or seek an in-person evaluation if the symptoms worsen or if the condition fails to improve as anticipated.  I  Spent 12 minutes on this telephone encounter  This note has been created with Surveyor, quantity. Any transcriptional errors are unintentional.  Karle Plumber, MD

## 2022-06-16 DIAGNOSIS — M797 Fibromyalgia: Secondary | ICD-10-CM | POA: Diagnosis not present

## 2022-06-16 DIAGNOSIS — R0602 Shortness of breath: Secondary | ICD-10-CM | POA: Diagnosis not present

## 2022-06-16 DIAGNOSIS — R0789 Other chest pain: Secondary | ICD-10-CM | POA: Diagnosis not present

## 2022-06-16 DIAGNOSIS — R52 Pain, unspecified: Secondary | ICD-10-CM | POA: Diagnosis not present

## 2022-06-16 DIAGNOSIS — R059 Cough, unspecified: Secondary | ICD-10-CM | POA: Diagnosis not present

## 2022-06-16 DIAGNOSIS — G35 Multiple sclerosis: Secondary | ICD-10-CM | POA: Diagnosis not present

## 2022-06-16 DIAGNOSIS — F17210 Nicotine dependence, cigarettes, uncomplicated: Secondary | ICD-10-CM | POA: Diagnosis not present

## 2022-06-16 DIAGNOSIS — R051 Acute cough: Secondary | ICD-10-CM | POA: Diagnosis not present

## 2022-06-16 DIAGNOSIS — I10 Essential (primary) hypertension: Secondary | ICD-10-CM | POA: Diagnosis not present

## 2022-07-04 ENCOUNTER — Other Ambulatory Visit: Payer: Self-pay | Admitting: Internal Medicine

## 2022-07-04 ENCOUNTER — Other Ambulatory Visit: Payer: Self-pay | Admitting: Nurse Practitioner

## 2022-07-04 DIAGNOSIS — G35 Multiple sclerosis: Secondary | ICD-10-CM

## 2022-07-04 DIAGNOSIS — G6289 Other specified polyneuropathies: Secondary | ICD-10-CM

## 2022-07-06 ENCOUNTER — Other Ambulatory Visit: Payer: Self-pay | Admitting: Internal Medicine

## 2022-07-11 ENCOUNTER — Other Ambulatory Visit: Payer: Self-pay | Admitting: Neurology

## 2022-07-19 ENCOUNTER — Encounter (HOSPITAL_BASED_OUTPATIENT_CLINIC_OR_DEPARTMENT_OTHER): Payer: Medicare Other | Admitting: Internal Medicine

## 2022-08-17 ENCOUNTER — Ambulatory Visit: Payer: Medicare Other | Admitting: Neurology

## 2022-08-17 ENCOUNTER — Encounter: Payer: Self-pay | Admitting: Neurology

## 2022-11-14 ENCOUNTER — Telehealth: Payer: Self-pay | Admitting: Emergency Medicine

## 2022-11-14 NOTE — Telephone Encounter (Signed)
Copied from [place] [phone]. Topic: General - Other >> Nov 14, 2022  5:02 PM [place] B wrote: Reason for CRM: Patient called in trying to  get info of when she had sleep test done back in Sept or Oct of last year. Please call back

## 2022-11-15 NOTE — Telephone Encounter (Signed)
Called LVM to call back

## 2022-11-16 NOTE — Telephone Encounter (Signed)
Called & LVM to call back in regards to sleep study inquiry.

## 2022-11-23 ENCOUNTER — Telehealth: Payer: Self-pay | Admitting: Internal Medicine

## 2022-11-23 NOTE — Telephone Encounter (Signed)
Called patient to schedule Medicare Annual Wellness Visit (AWV). Left message for patient to call back and schedule Medicare Annual Wellness Visit (AWV).  Last date of AWV: due awvi 12/11/17 per [place]    If any questions, please contact me at [phone].  Thank you ,  [place] Boards AWV direct phone # [phone]

## 2022-12-21 ENCOUNTER — Telehealth: Payer: Self-pay

## 2022-12-21 NOTE — Telephone Encounter (Signed)
Received notification from [facility] that they wanted to inform Dr. Allena Katz that they have made several attempts to contact patient to fill her Dimethyl Fum Dr Gerlean Ren 240 Mg. They have asked we notify patient and have patient contact them at [phone] to fill her medication. Fax#:[phone].

## 2022-12-21 NOTE — Telephone Encounter (Signed)
Called patient and left a message for a call back. Need to inform her of below.

## 2022-12-22 NOTE — Telephone Encounter (Signed)
Called patient and left a message for a call back.  

## 2022-12-23 NOTE — Telephone Encounter (Signed)
Called patient and left a message for a call back.   Mychart message sent to patient to inform of this.

## 2023-01-20 ENCOUNTER — Telehealth: Payer: Self-pay | Admitting: Internal Medicine

## 2023-01-20 NOTE — Telephone Encounter (Signed)
Copied from CRM [id number]. Topic: Medicare AWV >> Jan 20, 2023  3:33 PM Rushie Goltz wrote: Reason for CRM: Called patient to schedule Medicare Annual Wellness Visit (AWV). Left message for patient to call back and schedule Medicare Annual Wellness Visit (AWV).  Last date of AWV: AWVI eligible as of 12/11/17  Please schedule an AWVI appointment at any time with [facility] VISIT.  If any questions, please contact me at [phone].   Thank you,  [facility] Support [facility] Direct dial  [phone]
